# Patient Record
Sex: Male | Born: 1935 | Race: White | Hispanic: No | Smoking: Former smoker
Health system: Southern US, Community
[De-identification: ages and names within clinical notes are randomized; demographics above are authoritative.]

## PROBLEM LIST (undated history)

## (undated) DIAGNOSIS — K219 Gastro-esophageal reflux disease without esophagitis: Secondary | ICD-10-CM

## (undated) DIAGNOSIS — R609 Edema, unspecified: Secondary | ICD-10-CM

## (undated) DIAGNOSIS — F419 Anxiety disorder, unspecified: Secondary | ICD-10-CM

## (undated) DIAGNOSIS — G609 Hereditary and idiopathic neuropathy, unspecified: Secondary | ICD-10-CM

## (undated) DIAGNOSIS — Z8711 Personal history of peptic ulcer disease: Secondary | ICD-10-CM

## (undated) DIAGNOSIS — M1990 Unspecified osteoarthritis, unspecified site: Secondary | ICD-10-CM

## (undated) DIAGNOSIS — Z8739 Personal history of other diseases of the musculoskeletal system and connective tissue: Secondary | ICD-10-CM

## (undated) DIAGNOSIS — I739 Peripheral vascular disease, unspecified: Secondary | ICD-10-CM

## (undated) DIAGNOSIS — E7800 Pure hypercholesterolemia, unspecified: Secondary | ICD-10-CM

## (undated) DIAGNOSIS — F32A Depression, unspecified: Secondary | ICD-10-CM

## (undated) DIAGNOSIS — K449 Diaphragmatic hernia without obstruction or gangrene: Secondary | ICD-10-CM

## (undated) DIAGNOSIS — E119 Type 2 diabetes mellitus without complications: Secondary | ICD-10-CM

## (undated) DIAGNOSIS — K259 Gastric ulcer, unspecified as acute or chronic, without hemorrhage or perforation: Secondary | ICD-10-CM

## (undated) DIAGNOSIS — Z8719 Personal history of other diseases of the digestive system: Secondary | ICD-10-CM

## (undated) DIAGNOSIS — I10 Essential (primary) hypertension: Secondary | ICD-10-CM

## (undated) DIAGNOSIS — M75102 Unspecified rotator cuff tear or rupture of left shoulder, not specified as traumatic: Secondary | ICD-10-CM

## (undated) DIAGNOSIS — R42 Dizziness and giddiness: Secondary | ICD-10-CM

## (undated) DIAGNOSIS — Z87442 Personal history of urinary calculi: Secondary | ICD-10-CM

## (undated) DIAGNOSIS — I8390 Asymptomatic varicose veins of unspecified lower extremity: Secondary | ICD-10-CM

## (undated) DIAGNOSIS — E559 Vitamin D deficiency, unspecified: Secondary | ICD-10-CM

## (undated) DIAGNOSIS — F329 Major depressive disorder, single episode, unspecified: Secondary | ICD-10-CM

## (undated) HISTORY — DX: Vitamin D deficiency, unspecified: E55.9

## (undated) HISTORY — PX: CATARACT EXTRACTION W/ INTRAOCULAR LENS  IMPLANT, BILATERAL: SHX1307

## (undated) HISTORY — DX: Diaphragmatic hernia without obstruction or gangrene: K44.9

## (undated) HISTORY — PX: ESOPHAGEAL DILATION: SHX303

## (undated) HISTORY — PX: TONSILLECTOMY: SUR1361

## (undated) HISTORY — DX: Dizziness and giddiness: R42

## (undated) HISTORY — DX: Edema, unspecified: R60.9

## (undated) HISTORY — DX: Hereditary and idiopathic neuropathy, unspecified: G60.9

## (undated) HISTORY — PX: FRACTURE SURGERY: SHX138

## (undated) HISTORY — PX: MASTECTOMY: SHX3

---

## 2011-07-13 ENCOUNTER — Emergency Department (HOSPITAL_COMMUNITY)
Admission: EM | Admit: 2011-07-13 | Discharge: 2011-07-13 | Disposition: A | Discharge status: Home / Self Care | Payer: Medicare Other | Attending: Emergency Medicine | Admitting: Emergency Medicine

## 2011-07-13 ENCOUNTER — Encounter: Payer: Self-pay | Admitting: *Deleted

## 2011-07-13 ENCOUNTER — Emergency Department (HOSPITAL_COMMUNITY): Payer: Medicare Other

## 2011-07-13 DIAGNOSIS — I10 Essential (primary) hypertension: Secondary | ICD-10-CM | POA: Insufficient documentation

## 2011-07-13 DIAGNOSIS — S43005A Unspecified dislocation of left shoulder joint, initial encounter: Secondary | ICD-10-CM

## 2011-07-13 DIAGNOSIS — R079 Chest pain, unspecified: Secondary | ICD-10-CM | POA: Insufficient documentation

## 2011-07-13 DIAGNOSIS — M5030 Other cervical disc degeneration, unspecified cervical region: Secondary | ICD-10-CM | POA: Insufficient documentation

## 2011-07-13 DIAGNOSIS — E7800 Pure hypercholesterolemia, unspecified: Secondary | ICD-10-CM | POA: Insufficient documentation

## 2011-07-13 DIAGNOSIS — I6200 Nontraumatic subdural hemorrhage, unspecified: Secondary | ICD-10-CM | POA: Insufficient documentation

## 2011-07-13 DIAGNOSIS — S43006A Unspecified dislocation of unspecified shoulder joint, initial encounter: Secondary | ICD-10-CM | POA: Insufficient documentation

## 2011-07-13 DIAGNOSIS — Y9229 Other specified public building as the place of occurrence of the external cause: Secondary | ICD-10-CM | POA: Insufficient documentation

## 2011-07-13 DIAGNOSIS — S0180XA Unspecified open wound of other part of head, initial encounter: Secondary | ICD-10-CM | POA: Insufficient documentation

## 2011-07-13 DIAGNOSIS — W010XXA Fall on same level from slipping, tripping and stumbling without subsequent striking against object, initial encounter: Secondary | ICD-10-CM | POA: Insufficient documentation

## 2011-07-13 HISTORY — DX: Pure hypercholesterolemia, unspecified: E78.00

## 2011-07-13 HISTORY — DX: Essential (primary) hypertension: I10

## 2011-07-13 MED ORDER — ONDANSETRON HCL 4 MG/2ML IJ SOLN
4.0000 mg | Freq: Once | INTRAMUSCULAR | Status: AC
  Administered 2011-07-13: 4 mg via INTRAVENOUS

## 2011-07-13 MED ORDER — ONDANSETRON HCL 4 MG/2ML IJ SOLN
INTRAMUSCULAR | Status: AC
  Filled 2011-07-13: qty 2

## 2011-07-13 MED ORDER — ONDANSETRON HCL 4 MG/2ML IJ SOLN
INTRAMUSCULAR | Status: AC
  Administered 2011-07-13: 20:00:00
  Filled 2011-07-13: qty 2

## 2011-07-13 MED ORDER — ETOMIDATE 2 MG/ML IV SOLN
INTRAVENOUS | Status: AC
  Administered 2011-07-13: 10 mg
  Filled 2011-07-13: qty 10

## 2011-07-13 MED ORDER — HYDROMORPHONE HCL PF 1 MG/ML IJ SOLN
1.0000 mg | Freq: Once | INTRAMUSCULAR | Status: AC
  Administered 2011-07-13: 19:00:00 via INTRAVENOUS

## 2011-07-13 MED ORDER — TETANUS-DIPHTH-ACELL PERTUSSIS 5-2.5-18.5 LF-MCG/0.5 IM SUSP
0.5000 mL | Freq: Once | INTRAMUSCULAR | Status: AC
  Administered 2011-07-13: 0.5 mL via INTRAMUSCULAR
  Filled 2011-07-13: qty 0.5

## 2011-07-13 MED ORDER — HYDROCODONE-ACETAMINOPHEN 5-325 MG PO TABS: 1.0000 | ORAL_TABLET | Freq: Four times a day (QID) | ORAL | Status: AC | PRN

## 2011-07-13 MED ORDER — HYDROMORPHONE HCL PF 1 MG/ML IJ SOLN
INTRAMUSCULAR | Status: AC
  Filled 2011-07-13: qty 1

## 2011-07-13 MED ORDER — BACITRACIN ZINC 500 UNIT/GM EX OINT
TOPICAL_OINTMENT | CUTANEOUS | Status: AC
  Administered 2011-07-13
  Filled 2011-07-13: qty 0.9

## 2011-07-13 MED ORDER — NALOXONE HCL 1 MG/ML IJ SOLN
INTRAMUSCULAR | Status: AC
  Administered 2011-07-13: 19:00:00
  Filled 2011-07-13: qty 4

## 2011-07-13 MED ORDER — ONDANSETRON HCL 4 MG PO TABS: 8.0000 mg | ORAL_TABLET | Freq: Two times a day (BID) | ORAL | Status: AC | PRN

## 2011-07-13 MED ORDER — "THROMBI-PAD 3""X3"" EX PADS"
MEDICATED_PAD | CUTANEOUS | Status: AC
  Administered 2011-07-13: 20:00:00
  Filled 2011-07-13: qty 1

## 2011-07-13 MED ORDER — FENTANYL CITRATE 0.05 MG/ML IJ SOLN
50.0000 ug | Freq: Once | INTRAMUSCULAR | Status: AC
  Administered 2011-07-13: 50 ug via INTRAVENOUS
  Filled 2011-07-13: qty 2

## 2011-07-13 MED ORDER — ONDANSETRON HCL 4 MG/2ML IJ SOLN
4.0000 mg | Freq: Once | INTRAMUSCULAR | Status: AC
  Administered 2011-07-13: 4 mg via INTRAVENOUS
  Filled 2011-07-13: qty 2

## 2011-07-13 NOTE — ED Notes (Signed)
Rx given x2 D/c instructions reviewed w/ pt and family - pt and family deny any further questions or concerns at present.  

## 2011-07-13 NOTE — ED Provider Notes (Signed)
Logan Bean   9:25PM patient discussed with attending physician. Will repair chin laceration.  LACERATION REPAIR Performed by: Angus Seller Authorized by: Angus Seller Consent: Verbal consent obtained. Risks and benefits: risks, benefits and alternatives were discussed Consent given by: patient Patient identity confirmed: provided demographic data Prepped and Draped in normal sterile fashion Wound explored  Laceration Location: Chin  Laceration Length: 2.5 cm  No Foreign Bodies seen or palpated  Anesthesia: local infiltration  Local anesthetic: lidocaine 2% with epinephrine  Anesthetic total: 3 ml  Irrigation method: syringe Amount of cleaning: standard  Skin closure: Skin with 6-0 nylon   Number of sutures: 3   Technique: Simple interrupted   Patient tolerance: Patient tolerated the procedure well with no immediate complications.   Angus Seller, [place] 07/13/11 [phone]

## 2011-07-13 NOTE — ED Provider Notes (Signed)
Medical screening examination/treatment/procedure(s) were conducted as a shared visit with non-physician practitioner(s) and myself.  I personally evaluated the patient during the encounter  Doug Sou, MD 07/13/11 2300

## 2011-07-13 NOTE — ED Notes (Signed)
Patient transported to CT w/ Shawn Stall, RN

## 2011-07-13 NOTE — ED Notes (Signed)
Pt returned from CT- pt alert and active with care- family present and updated on pt current status- pt belonging given to male family member

## 2011-07-13 NOTE — ED Provider Notes (Addendum)
History     CSN: [id number]  Arrival date & time 07/13/11  1757   First MD Initiated Contact with Patient 07/13/11 1836      Chief Complaint  Patient presents with  . Fall    per EMS pt fell from standing position in grocery store. pt landed face first. denies LOC. pt has lac to chin and c/o left shoulder pain. EMS reports obvious shoulder deformity.     (Consider location/radiation/quality/duration/timing/severity/associated sxs/prior treatment) HPI Patient tripped and fell on medially prior to arrival injuring left shoulder . Also suffered laceration to chin is resolving event he denies loss of consciousness. Shoulder pain is worse with attempting to move his shoulder no other injury. Family members report patient has unsteady gait at baseline due to arthiris. EMS treated patient with long board hard collar and CID. They administered intravenous fentanyl without relief of pain Past Medical History  Diagnosis Date  . Hypertension   . Hypercholesteremia    Arthritis No past surgical history on file. Surgery left breast unknown reason No family history on file.  History  Substance Use Topics  . Smoking status: Not on file  . Smokeless tobacco: Not on file  . Alcohol Use:       Review of Systems  Constitutional: Negative.   HENT: Negative.   Respiratory: Negative.   Cardiovascular: Negative.   Gastrointestinal: Negative.   Musculoskeletal: Positive for arthralgias.       Left shoulder pain, chronic bilateral knee pain secondary to arthritis  Skin: Positive for wound.       Can laceration  Neurological: Negative.   Hematological: Negative.   Psychiatric/Behavioral: Negative.     Allergies  Morphine and related  Home Medications  No current outpatient prescriptions on file.  BP 153/64  Pulse 107  Temp(Src) 97.6 F (36.4 C) (Oral)  Resp 23  SpO2 97%  Physical Exam  Nursing note and vitals reviewed. Constitutional: He appears well-developed and  well-nourished. He appears distressed.       Osco coma score 15 appears uncomfortable  HENT:  Head: Normocephalic and atraumatic.       2.5 cm laceration to chin, oozing blood. No deformity teeth are intact bilateral tympanic membranes normal  Eyes: Conjunctivae are normal. Pupils are equal, round, and reactive to light.  Neck: No tracheal deviation present. No thyromegaly present.       No cervical spine  Cardiovascular: Normal rate and regular rhythm.   No murmur heard. Pulmonary/Chest: Effort normal and breath sounds normal.  Abdominal: Soft. He exhibits no distension. There is no tenderness.  Musculoskeletal: Normal range of motion. He exhibits no edema and no tenderness.       Left upper extremity with deformity his shoulder shoulder is held in an Abducted, externally rotated position, radial pulse 2+  Neurological: He is alert. Coordination normal.  Skin: Skin is warm and dry. No rash noted.  Psychiatric: He has a normal mood and affect.    ED Course  Procedural sedation Date/Time: 07/13/2011 6:56 PM Performed by: Doug Sou Authorized by: Doug Sou Consent: Verbal consent obtained. Risks and benefits: risks, benefits and alternatives were discussed Consent given by: power of attorney Patient understanding: patient states understanding of the procedure being performed Patient consent: the patient's understanding of the procedure matches consent given Procedure consent: procedure consent matches procedure scheduled Relevant documents: relevant documents present and verified Test results: test results available and properly labeled Site marked: the operative site was marked Imaging studies: imaging studies available Required  items: required blood products, implants, devices, and special equipment available Patient identity confirmed: verbally with patient, arm band and hospital-assigned identification number Time out: Immediately prior to procedure a "time out" was  called to verify the correct patient, procedure, equipment, support staff and site/side marked as required. Local anesthesia used: no Patient sedated: yes Sedation type: moderate (conscious) sedation Sedatives: etomidate Analgesia: hydromorphone Sedation start date/time: 07/13/2011 6:56 PM Sedation end date/time: 07/13/2011 7:20 PM Vitals: Vital signs were monitored during sedation. Comments: Patient desaturated immediately after procedure completed on pulse. His respirations were assisted with bag valve mask and supplemental oxygen he regained consciousness fully and was alert appropriate Glasgow Coma Score 15 no complication  Reduction of dislocation Date/Time: 07/13/2011 6:56 PM Performed by: Doug Sou Authorized by: Doug Sou Consent: Written consent obtained. Risks and benefits: risks, benefits and alternatives were discussed Consent given by: power of attorney Patient understanding: patient states understanding of the procedure being performed Patient consent: the patient's understanding of the procedure matches consent given Procedure consent: procedure consent matches procedure scheduled Relevant documents: relevant documents present and verified Test results: test results available and properly labeled Site marked: the operative site was marked Imaging studies: imaging studies available Required items: required blood products, implants, devices, and special equipment available Patient identity confirmed: verbally with patient and arm band Time out: Immediately prior to procedure a "time out" was called to verify the correct patient, procedure, equipment, support staff and site/side marked as required. Local anesthesia used: no Patient sedated: yes Sedation type: moderate (conscious) sedation Sedatives: etomidate Analgesia: hydromorphone Sedation start date/time: 07/13/2011 6:56 PM Sedation end date/time: 07/13/2011 7:20 PM Comments: Patient desaturated on pulse  oximetry immediately after reduction. His respirations were transiently assisted with bag valve mask. He subsequently became alert appropriate back to baseline mental status   (including critical care time) 10:10 PM patient alert ambulates without difficulty not lightheaded on standing complains of slight pain on standing at rib cage. RIBS minimally tender diffusely abdomen is reexamined nontender. Patient denies abdominal pain Laceration at the chin was repaired by Mr.Dammen. 10:50 PM patient alert awake feels ready to home  Labs Reviewed - No data to display Dg Shoulder Left  07/13/2011  *RADIOLOGY REPORT*  Clinical Data: Shoulder pain status post fall.  LEFT SHOULDER - 2+ VIEW  Comparison: None.  Findings: There is anterior dislocation of the glenohumeral joint. No definite fracture is identified.  The acromioclavicular joint appears mildly widened.  IMPRESSION: Anterior dislocation of the left glenohumeral joint.  Postreduction views are recommended to evaluate for associated fracture.  Original Report Authenticated By: Gerrianne Scale, M.D.     No diagnosis found.    MDM  Plan orthopedic referral Dr.Aluisio, patient request Prescription hydrocodone-apap,, zofran Sutures out 5 days Diagnosis number#1 fall #2 dislocation of left shoulder #3 laceration of chin #4 multiple conntusions        Doug Sou, MD 07/13/11 2255  Doug Sou, MD 07/13/11 2259

## 2011-07-13 NOTE — ED Notes (Signed)
Pts sats decreased, blow-by assist given, 1mg  NArcan given and sats increased to 96%. EDP Jacubowitz at bedside with administration.

## 2011-07-13 NOTE — ED Notes (Signed)
ZOX:WR60<AV> Expected date:07/13/11<BR> Expected time: 5:42 PM<BR> Means of arrival:Ambulance<BR> Comments:<BR> M41 - 89yoM Fall, chin lac and shoulder pain

## 2011-07-19 DIAGNOSIS — S43006A Unspecified dislocation of unspecified shoulder joint, initial encounter: Secondary | ICD-10-CM | POA: Diagnosis not present

## 2011-07-30 ENCOUNTER — Other Ambulatory Visit: Payer: Self-pay | Admitting: Internal Medicine

## 2011-07-30 ENCOUNTER — Ambulatory Visit
Admission: RE | Admit: 2011-07-30 | Discharge: 2011-07-30 | Disposition: A | Discharge status: Home / Self Care | Payer: Medicare Other | Source: Ambulatory Visit | Attending: Internal Medicine | Admitting: Internal Medicine

## 2011-07-30 DIAGNOSIS — M19079 Primary osteoarthritis, unspecified ankle and foot: Secondary | ICD-10-CM | POA: Diagnosis not present

## 2011-07-30 DIAGNOSIS — M869 Osteomyelitis, unspecified: Secondary | ICD-10-CM

## 2011-07-30 DIAGNOSIS — IMO0002 Reserved for concepts with insufficient information to code with codable children: Secondary | ICD-10-CM | POA: Diagnosis not present

## 2011-07-30 DIAGNOSIS — I10 Essential (primary) hypertension: Secondary | ICD-10-CM | POA: Diagnosis not present

## 2011-07-30 DIAGNOSIS — S43006A Unspecified dislocation of unspecified shoulder joint, initial encounter: Secondary | ICD-10-CM | POA: Diagnosis not present

## 2011-07-30 DIAGNOSIS — M2550 Pain in unspecified joint: Secondary | ICD-10-CM | POA: Diagnosis not present

## 2011-07-30 DIAGNOSIS — M949 Disorder of cartilage, unspecified: Secondary | ICD-10-CM | POA: Diagnosis not present

## 2011-07-30 DIAGNOSIS — E559 Vitamin D deficiency, unspecified: Secondary | ICD-10-CM | POA: Diagnosis not present

## 2011-07-30 DIAGNOSIS — R42 Dizziness and giddiness: Secondary | ICD-10-CM | POA: Diagnosis not present

## 2011-07-30 DIAGNOSIS — M899 Disorder of bone, unspecified: Secondary | ICD-10-CM | POA: Diagnosis not present

## 2011-07-30 DIAGNOSIS — E1365 Other specified diabetes mellitus with hyperglycemia: Secondary | ICD-10-CM | POA: Diagnosis not present

## 2011-07-30 DIAGNOSIS — E785 Hyperlipidemia, unspecified: Secondary | ICD-10-CM | POA: Diagnosis not present

## 2011-07-31 ENCOUNTER — Other Ambulatory Visit: Payer: Self-pay | Admitting: Internal Medicine

## 2011-07-31 DIAGNOSIS — M8580 Other specified disorders of bone density and structure, unspecified site: Secondary | ICD-10-CM

## 2011-08-05 ENCOUNTER — Encounter (HOSPITAL_BASED_OUTPATIENT_CLINIC_OR_DEPARTMENT_OTHER): Payer: Medicare Other | Attending: General Surgery

## 2011-08-05 DIAGNOSIS — S91109A Unspecified open wound of unspecified toe(s) without damage to nail, initial encounter: Secondary | ICD-10-CM | POA: Diagnosis not present

## 2011-08-05 DIAGNOSIS — E7800 Pure hypercholesterolemia, unspecified: Secondary | ICD-10-CM | POA: Diagnosis not present

## 2011-08-05 DIAGNOSIS — S43006A Unspecified dislocation of unspecified shoulder joint, initial encounter: Secondary | ICD-10-CM | POA: Diagnosis not present

## 2011-08-05 DIAGNOSIS — IMO0002 Reserved for concepts with insufficient information to code with codable children: Secondary | ICD-10-CM | POA: Insufficient documentation

## 2011-08-05 DIAGNOSIS — Z7982 Long term (current) use of aspirin: Secondary | ICD-10-CM | POA: Diagnosis not present

## 2011-08-05 DIAGNOSIS — Z79899 Other long term (current) drug therapy: Secondary | ICD-10-CM | POA: Insufficient documentation

## 2011-08-05 DIAGNOSIS — I10 Essential (primary) hypertension: Secondary | ICD-10-CM | POA: Diagnosis not present

## 2011-08-05 DIAGNOSIS — W19XXXA Unspecified fall, initial encounter: Secondary | ICD-10-CM | POA: Insufficient documentation

## 2011-08-05 NOTE — Progress Notes (Signed)
[facility]  NAME:  Logan Bean, Logan Bean              ACCOUNT NO.:  [id number]  MEDICAL RECORD NO.:  [id number]      DATE OF BIRTH:  06-09-1921  PHYSICIAN:  Joanne Gavel, M.D.         VISIT DATE:  08/05/2011                                  OFFICE VISIT   CHIEF COMPLAINT:  Wound, left great toe.  HISTORY OF PRESENT ILLNESS:  This patient fell approximately 3 weeks ago.  He developed a great deal of swelling in his foot along with a dislocation of the shoulder.  A blister formed and opened leaving a wound on the dorsum of the great toe.  He has no history of diabetes, no heart disease, lung disease, or kidney disease.  PAST SURGICAL HISTORY:  Negative.  Cigarettes, none.  Alcohol, none.  MEDICATIONS:  Lisinopril, aspirin, simvastatin.  ALLERGIES:  MORPHINE causes nausea, vomiting.  REVIEW OF SYSTEMS:  Noncontributory.  PHYSICAL EXAMINATION:  GENERAL APPEARANCE :  Awake, alert, well oriented, well developed, well nourished, in no distress. VITAL SIGNS:  Temperature 97.6, pulse 70, respirations 18, blood pressure 128/70. CRANIUM:  Normocephalic. EYES, EARS, NOSE, THROAT:  Normal. NECK:  Supple. CHEST:  Clear. HEART:  Regular rhythm. EXTREMITIES:  Normal symmetrical strength.  Examination of the foot reveals palpable pulse with an ABI of 0.9.  On the dorsum of the toe, there is an area that has been blistered, but now appears to be completely healed.  There is a small area that looks like it is somewhat abraded, but I do not believe there are any full-thickness injuries.  PLAN OF TREATMENT:  Continue triple antibiotic ointment and dressing. Let me see him in 7 days to be sure the wound is really healed.     Joanne Gavel, M.D.     RA/MEDQ  D:  08/05/2011  T:  08/05/2011  Job:  [id number]

## 2011-08-08 ENCOUNTER — Other Ambulatory Visit: Payer: Medicare Other

## 2011-08-12 DIAGNOSIS — S91109A Unspecified open wound of unspecified toe(s) without damage to nail, initial encounter: Secondary | ICD-10-CM | POA: Diagnosis not present

## 2011-08-12 DIAGNOSIS — IMO0002 Reserved for concepts with insufficient information to code with codable children: Secondary | ICD-10-CM | POA: Diagnosis not present

## 2011-08-12 DIAGNOSIS — Z7982 Long term (current) use of aspirin: Secondary | ICD-10-CM | POA: Diagnosis not present

## 2011-08-12 DIAGNOSIS — Z79899 Other long term (current) drug therapy: Secondary | ICD-10-CM | POA: Diagnosis not present

## 2011-08-13 ENCOUNTER — Other Ambulatory Visit: Payer: Medicare Other

## 2011-08-14 DIAGNOSIS — S43006A Unspecified dislocation of unspecified shoulder joint, initial encounter: Secondary | ICD-10-CM | POA: Diagnosis not present

## 2011-09-01 DIAGNOSIS — S43006A Unspecified dislocation of unspecified shoulder joint, initial encounter: Secondary | ICD-10-CM | POA: Diagnosis not present

## 2011-09-02 DIAGNOSIS — I10 Essential (primary) hypertension: Secondary | ICD-10-CM | POA: Diagnosis not present

## 2011-09-02 DIAGNOSIS — E559 Vitamin D deficiency, unspecified: Secondary | ICD-10-CM | POA: Diagnosis not present

## 2011-09-02 DIAGNOSIS — E785 Hyperlipidemia, unspecified: Secondary | ICD-10-CM | POA: Diagnosis not present

## 2011-09-02 DIAGNOSIS — S43006A Unspecified dislocation of unspecified shoulder joint, initial encounter: Secondary | ICD-10-CM | POA: Diagnosis not present

## 2011-09-05 DIAGNOSIS — S43006A Unspecified dislocation of unspecified shoulder joint, initial encounter: Secondary | ICD-10-CM | POA: Diagnosis not present

## 2011-09-09 ENCOUNTER — Encounter (HOSPITAL_BASED_OUTPATIENT_CLINIC_OR_DEPARTMENT_OTHER): Payer: Medicare Other

## 2011-09-09 DIAGNOSIS — M75120 Complete rotator cuff tear or rupture of unspecified shoulder, not specified as traumatic: Secondary | ICD-10-CM | POA: Diagnosis not present

## 2011-09-15 DIAGNOSIS — M75120 Complete rotator cuff tear or rupture of unspecified shoulder, not specified as traumatic: Secondary | ICD-10-CM | POA: Diagnosis not present

## 2011-09-17 DIAGNOSIS — E785 Hyperlipidemia, unspecified: Secondary | ICD-10-CM | POA: Diagnosis not present

## 2011-09-17 DIAGNOSIS — Z01810 Encounter for preprocedural cardiovascular examination: Secondary | ICD-10-CM | POA: Diagnosis not present

## 2011-09-17 DIAGNOSIS — E1365 Other specified diabetes mellitus with hyperglycemia: Secondary | ICD-10-CM | POA: Diagnosis not present

## 2011-09-17 DIAGNOSIS — S43006A Unspecified dislocation of unspecified shoulder joint, initial encounter: Secondary | ICD-10-CM | POA: Diagnosis not present

## 2011-09-30 DIAGNOSIS — E138 Other specified diabetes mellitus with unspecified complications: Secondary | ICD-10-CM | POA: Diagnosis not present

## 2011-09-30 DIAGNOSIS — IMO0002 Reserved for concepts with insufficient information to code with codable children: Secondary | ICD-10-CM | POA: Diagnosis not present

## 2011-09-30 DIAGNOSIS — I10 Essential (primary) hypertension: Secondary | ICD-10-CM | POA: Diagnosis not present

## 2011-09-30 DIAGNOSIS — E559 Vitamin D deficiency, unspecified: Secondary | ICD-10-CM | POA: Diagnosis not present

## 2011-09-30 DIAGNOSIS — E785 Hyperlipidemia, unspecified: Secondary | ICD-10-CM | POA: Diagnosis not present

## 2011-10-22 ENCOUNTER — Encounter (HOSPITAL_COMMUNITY): Payer: Self-pay | Admitting: Pharmacy Technician

## 2011-10-27 ENCOUNTER — Encounter (HOSPITAL_COMMUNITY): Payer: Self-pay

## 2011-10-27 ENCOUNTER — Encounter (HOSPITAL_COMMUNITY): Payer: Self-pay | Admitting: *Deleted

## 2011-10-27 ENCOUNTER — Encounter (HOSPITAL_COMMUNITY)
Admission: RE | Admit: 2011-10-27 | Discharge: 2011-10-27 | Disposition: A | Discharge status: Home / Self Care | Payer: Medicare Other | Source: Ambulatory Visit | Attending: Orthopedic Surgery | Admitting: Orthopedic Surgery

## 2011-10-27 DIAGNOSIS — Z01812 Encounter for preprocedural laboratory examination: Secondary | ICD-10-CM | POA: Diagnosis not present

## 2011-10-27 DIAGNOSIS — M069 Rheumatoid arthritis, unspecified: Secondary | ICD-10-CM | POA: Diagnosis not present

## 2011-10-27 DIAGNOSIS — M25819 Other specified joint disorders, unspecified shoulder: Secondary | ICD-10-CM | POA: Diagnosis not present

## 2011-10-27 DIAGNOSIS — M1990 Unspecified osteoarthritis, unspecified site: Secondary | ICD-10-CM | POA: Diagnosis not present

## 2011-10-27 DIAGNOSIS — I10 Essential (primary) hypertension: Secondary | ICD-10-CM | POA: Diagnosis not present

## 2011-10-27 DIAGNOSIS — E7800 Pure hypercholesterolemia, unspecified: Secondary | ICD-10-CM | POA: Diagnosis not present

## 2011-10-27 DIAGNOSIS — Z79899 Other long term (current) drug therapy: Secondary | ICD-10-CM | POA: Diagnosis not present

## 2011-10-27 DIAGNOSIS — Z7982 Long term (current) use of aspirin: Secondary | ICD-10-CM | POA: Diagnosis not present

## 2011-10-27 DIAGNOSIS — S43429A Sprain of unspecified rotator cuff capsule, initial encounter: Secondary | ICD-10-CM | POA: Diagnosis not present

## 2011-10-27 LAB — DIFFERENTIAL
Basophils Absolute: 0 10*3/uL (ref 0.0–0.1)
Basophils Relative: 1 % (ref 0–1)
Lymphocytes Relative: 25 % (ref 12–46)
Monocytes Relative: 8 % (ref 3–12)
Neutro Abs: 4 10*3/uL (ref 1.7–7.7)
Neutrophils Relative %: 64 % (ref 43–77)

## 2011-10-27 LAB — SURGICAL PCR SCREEN
MRSA, PCR: NEGATIVE
Staphylococcus aureus: POSITIVE — AB

## 2011-10-27 LAB — CBC
Hemoglobin: 14.8 g/dL (ref 13.0–17.0)
MCHC: 32.2 g/dL (ref 30.0–36.0)
WBC: 6.2 10*3/uL (ref 4.0–10.5)

## 2011-10-27 LAB — BASIC METABOLIC PANEL
CO2: 27 mEq/L (ref 19–32)
Calcium: 10 mg/dL (ref 8.4–10.5)
Chloride: 104 mEq/L (ref 96–112)
Glucose, Bld: 85 mg/dL (ref 70–99)
Potassium: 4.3 mEq/L (ref 3.5–5.1)
Sodium: 140 mEq/L (ref 135–145)

## 2011-10-27 NOTE — Pre-Procedure Instructions (Addendum)
Medical clearance note dr Glade Lloyd on chart ekg 09-17-2011  [facility] senior care on chart Chest 1 view 07-03-2011 in epic

## 2011-10-27 NOTE — Patient Instructions (Signed)
20 Logan Bean  10/27/2011   Your procedure is scheduled on:  Wednesday 09-07-2011  Report to [facility] at  0830 AM.  Call this number if you have problems the morning of surgery: [phone]   Remember:   Do not eat food or drink liquids:After Midnight.  .  Take these medicines the morning of surgery with A SIP OF WATER: omeprazole   Do not wear jewelry or make up.  Do not wear lotions, powders, or perfumes.Do not wear deodorant.    Do not bring valuables to the hospital.  Contacts, dentures or bridgework may not be worn into surgery.  Leave suitcase in the car. After surgery it may be brought to your room.  For patients admitted to the hospital, checkout time is 11:00 AM the day of discharge.     Special Instructions: CHG Shower Use Special Wash: 1/2 bottle night before surgery and 1/2 bottle morning of surgery.neck down avoid private area.   Please read over the following fact sheets that you were given: MRSA Information, incentive spirometer fact sheet.  Cain Sieve [facility] pre op nurse phone number [phone], call if needed

## 2011-10-31 DIAGNOSIS — M109 Gout, unspecified: Secondary | ICD-10-CM | POA: Diagnosis not present

## 2011-10-31 DIAGNOSIS — E559 Vitamin D deficiency, unspecified: Secondary | ICD-10-CM | POA: Diagnosis not present

## 2011-10-31 DIAGNOSIS — E1365 Other specified diabetes mellitus with hyperglycemia: Secondary | ICD-10-CM | POA: Diagnosis not present

## 2011-10-31 DIAGNOSIS — M75120 Complete rotator cuff tear or rupture of unspecified shoulder, not specified as traumatic: Secondary | ICD-10-CM | POA: Diagnosis not present

## 2011-11-04 NOTE — H&P (Signed)
  Bayan Menna DOB: [birth year]  Chief Complaint: left shoulder pain  History of Present Illness The patient is an 78 year old male who is scheduled for an open left rotator cuff repair with possible use of graft and anchors with Dr. Darrelyn Hillock on Wednesday November 05, 2011. The patient first presented to Dr. Darrelyn Hillock with a shoulder dislocation in February of 2013 after he feel in [employer]. He went through physical therapy and continued to have pain and weakness in the left shoulder. MRI was ordered which showed that he has massive tears of the left rotator cuff. Most predictable means for decreased pain and increased function in the left shoulder is an open rotator cuff repair of the left rotator cuff. Risks and benefits of the surgery discussed. He was given surgical clearance by Dr. Sander Radon.   Past Medical History Hypertension Hypercholesterolemia Osteoarthritis Rheumatoid Arthritis  Allergies Morphine Derivatives   Family History Heart Disease. father Heart disease in male family member before age 11 Hypertension. father   Social History Alcohol use. current drinker; drinks beer and wine; less than 5 per week Children. 0 Current work status. retired Financial planner (Currently). no Drug/Alcohol Rehab (Previously). no Exercise. Exercises never; does other Illicit drug use. no Living situation. live with caregiver Marital status. widowed Pain Contract. no   Medication History Simvastatin ( Oral) Specific dose unknown - Active. Lisinopril ( Oral) Specific dose unknown - Active. AmLODIPine Besylate ( Oral) Specific dose unknown - Active. Aspirin Low Strength ( Oral) Specific dose unknown - Active. Glucosamine Chondroitin Complx ( Oral) Specific dose unknown - Active. Fish Oil Burp-Less ( Oral) Specific dose unknown - Active. Multiple Vitamin ( Oral) Specific dose unknown - Active.   Past Surgical History Tonsillectomy   Review of  Systems Skin:Present- Skin Color Changes. Not Present- Itching, Rash, Ulcer, Psoriasis and Change in Hair or Nails. HEENT:Not Present- Sensitivity to light, Hearing problems, Nose Bleed and Ringing in the Ears. Respiratory:Present- Chronic Cough. Not Present- Snoring, Bloody sputum and Dyspnea. Cardiovascular:Present- Swelling of Extremities and Leg Cramps. Not Present- Shortness of Breath, Chest Pain and Palpitations. Male Genitourinary:Present- Incontinence. Not Present- Blood in Urine, Frequency and Nocturia. Musculoskeletal:Present- Joint Stiffness, Joint Swelling, Joint Pain and Back Pain. Not Present- Muscle Weakness and Muscle Pain. Neurological:Present- Tremor. Not Present- Tingling, Numbness, Burning, Headaches and Dizziness. Hematology:Present- Easy Bruising. Not Present- Abnormal Bleeding, Anemia and Blood Clots.   Vitals Weight: 161 lb Height: 63.5 in Body Surface Area: 1.81 m Body Mass Index: 28.07 kg/m BP: 114/69 (Sitting, Left Arm, Standard)   Physical Exam Alert and oriented. No acute distress. The lungs are clear today. Heart normal sinus rhythm and no murmur. Oral cavity is negative. He is unable to abduct beyond about 20 degrees. Shoulder flexion and extension is painful. Abdomen soft and nontender. Bowel sounds active. Neck supple.    Assessment & Plan Complex rotator cuff tear, left shoulder Open repair left shoulder rotator cuff tear      Dimitri Ped, PA-C

## 2011-11-05 ENCOUNTER — Other Ambulatory Visit: Payer: Self-pay | Admitting: Surgical

## 2011-11-05 ENCOUNTER — Ambulatory Visit (HOSPITAL_COMMUNITY)
Admission: RE | Admit: 2011-11-05 | Discharge: 2011-11-06 | Disposition: A | Discharge status: Home / Self Care | Payer: Medicare Other | Source: Ambulatory Visit | Attending: Orthopedic Surgery | Admitting: Orthopedic Surgery

## 2011-11-05 ENCOUNTER — Encounter (HOSPITAL_COMMUNITY)
Admission: RE | Disposition: A | Discharge status: Home / Self Care | Payer: Self-pay | Source: Ambulatory Visit | Attending: Orthopedic Surgery

## 2011-11-05 ENCOUNTER — Ambulatory Visit (HOSPITAL_COMMUNITY): Payer: Medicare Other | Admitting: Anesthesiology

## 2011-11-05 ENCOUNTER — Encounter (HOSPITAL_COMMUNITY): Payer: Self-pay | Admitting: Anesthesiology

## 2011-11-05 ENCOUNTER — Encounter (HOSPITAL_COMMUNITY): Payer: Self-pay | Admitting: *Deleted

## 2011-11-05 DIAGNOSIS — M25819 Other specified joint disorders, unspecified shoulder: Secondary | ICD-10-CM | POA: Insufficient documentation

## 2011-11-05 DIAGNOSIS — S46819A Strain of other muscles, fascia and tendons at shoulder and upper arm level, unspecified arm, initial encounter: Secondary | ICD-10-CM | POA: Diagnosis not present

## 2011-11-05 DIAGNOSIS — I10 Essential (primary) hypertension: Secondary | ICD-10-CM | POA: Insufficient documentation

## 2011-11-05 DIAGNOSIS — M069 Rheumatoid arthritis, unspecified: Secondary | ICD-10-CM | POA: Insufficient documentation

## 2011-11-05 DIAGNOSIS — M75122 Complete rotator cuff tear or rupture of left shoulder, not specified as traumatic: Secondary | ICD-10-CM | POA: Diagnosis present

## 2011-11-05 DIAGNOSIS — Z01812 Encounter for preprocedural laboratory examination: Secondary | ICD-10-CM | POA: Insufficient documentation

## 2011-11-05 DIAGNOSIS — K219 Gastro-esophageal reflux disease without esophagitis: Secondary | ICD-10-CM | POA: Diagnosis not present

## 2011-11-05 DIAGNOSIS — E7800 Pure hypercholesterolemia, unspecified: Secondary | ICD-10-CM | POA: Diagnosis not present

## 2011-11-05 DIAGNOSIS — S43429A Sprain of unspecified rotator cuff capsule, initial encounter: Secondary | ICD-10-CM | POA: Insufficient documentation

## 2011-11-05 DIAGNOSIS — S43499A Other sprain of unspecified shoulder joint, initial encounter: Secondary | ICD-10-CM | POA: Diagnosis not present

## 2011-11-05 DIAGNOSIS — Z79899 Other long term (current) drug therapy: Secondary | ICD-10-CM | POA: Insufficient documentation

## 2011-11-05 DIAGNOSIS — M1990 Unspecified osteoarthritis, unspecified site: Secondary | ICD-10-CM | POA: Insufficient documentation

## 2011-11-05 DIAGNOSIS — Z7982 Long term (current) use of aspirin: Secondary | ICD-10-CM | POA: Insufficient documentation

## 2011-11-05 DIAGNOSIS — E119 Type 2 diabetes mellitus without complications: Secondary | ICD-10-CM | POA: Diagnosis not present

## 2011-11-05 DIAGNOSIS — X58XXXA Exposure to other specified factors, initial encounter: Secondary | ICD-10-CM | POA: Insufficient documentation

## 2011-11-05 HISTORY — DX: Asymptomatic varicose veins of unspecified lower extremity: I83.90

## 2011-11-05 HISTORY — DX: Gastric ulcer, unspecified as acute or chronic, without hemorrhage or perforation: K25.9

## 2011-11-05 HISTORY — DX: Gastro-esophageal reflux disease without esophagitis: K21.9

## 2011-11-05 HISTORY — DX: Personal history of other diseases of the digestive system: Z87.19

## 2011-11-05 HISTORY — DX: Unspecified rotator cuff tear or rupture of left shoulder, not specified as traumatic: M75.102

## 2011-11-05 HISTORY — PX: SHOULDER OPEN ROTATOR CUFF REPAIR: SHX2407

## 2011-11-05 HISTORY — DX: Unspecified osteoarthritis, unspecified site: M19.90

## 2011-11-05 SURGERY — REPAIR, ROTATOR CUFF, OPEN
Anesthesia: General | Site: Shoulder | Laterality: Left | Wound class: Clean

## 2011-11-05 MED ORDER — HYDROMORPHONE HCL PF 1 MG/ML IJ SOLN: 0.2500 mg | INTRAMUSCULAR | Status: DC | PRN

## 2011-11-05 MED ORDER — ROCURONIUM BROMIDE 100 MG/10ML IV SOLN
INTRAVENOUS | Status: DC | PRN
  Administered 2011-11-05: 30 mg via INTRAVENOUS

## 2011-11-05 MED ORDER — BISACODYL 10 MG RE SUPP: 10.0000 mg | Freq: Every day | RECTAL | Status: DC | PRN

## 2011-11-05 MED ORDER — BACITRACIN-NEOMYCIN-POLYMYXIN 400-5-5000 EX OINT
TOPICAL_OINTMENT | CUTANEOUS | Status: AC
  Filled 2011-11-05: qty 1

## 2011-11-05 MED ORDER — MENTHOL 3 MG MT LOZG: 1.0000 | LOZENGE | OROMUCOSAL | Status: DC | PRN

## 2011-11-05 MED ORDER — ONDANSETRON HCL 4 MG/2ML IJ SOLN
INTRAMUSCULAR | Status: DC | PRN
  Administered 2011-11-05: 4 mg via INTRAVENOUS

## 2011-11-05 MED ORDER — CEFAZOLIN SODIUM 1-5 GM-% IV SOLN
1.0000 g | INTRAVENOUS | Status: AC
  Administered 2011-11-05: 1 g via INTRAVENOUS

## 2011-11-05 MED ORDER — SIMVASTATIN 20 MG PO TABS
20.0000 mg | ORAL_TABLET | Freq: Every day | ORAL | Status: DC
  Administered 2011-11-05: 20 mg via ORAL
  Filled 2011-11-05 (×2): qty 1

## 2011-11-05 MED ORDER — SODIUM CHLORIDE 0.9 % IR SOLN
Status: DC | PRN
  Administered 2011-11-05: 12:00:00

## 2011-11-05 MED ORDER — ACETAMINOPHEN 10 MG/ML IV SOLN
INTRAVENOUS | Status: AC
  Filled 2011-11-05: qty 100

## 2011-11-05 MED ORDER — LACTATED RINGERS IV SOLN: INTRAVENOUS | Status: DC

## 2011-11-05 MED ORDER — ACETAMINOPHEN 10 MG/ML IV SOLN
INTRAVENOUS | Status: DC | PRN
  Administered 2011-11-05: 1000 mg via INTRAVENOUS

## 2011-11-05 MED ORDER — METOCLOPRAMIDE HCL 5 MG/ML IJ SOLN
5.0000 mg | Freq: Three times a day (TID) | INTRAMUSCULAR | Status: DC | PRN
  Administered 2011-11-05: 5 mg via INTRAVENOUS

## 2011-11-05 MED ORDER — BACITRACIN-NEOMYCIN-POLYMYXIN 400-5-5000 EX OINT: TOPICAL_OINTMENT | CUTANEOUS | Status: DC | PRN

## 2011-11-05 MED ORDER — ONDANSETRON HCL 4 MG/2ML IJ SOLN
4.0000 mg | Freq: Four times a day (QID) | INTRAMUSCULAR | Status: DC | PRN
  Administered 2011-11-05: 4 mg via INTRAVENOUS

## 2011-11-05 MED ORDER — HYDROCODONE-ACETAMINOPHEN 5-325 MG PO TABS
1.0000 | ORAL_TABLET | ORAL | Status: DC | PRN
  Administered 2011-11-05: 1 via ORAL
  Filled 2011-11-05: qty 1

## 2011-11-05 MED ORDER — GLYCOPYRROLATE 0.2 MG/ML IJ SOLN
INTRAMUSCULAR | Status: DC | PRN
  Administered 2011-11-05: 0.4 mg via INTRAVENOUS

## 2011-11-05 MED ORDER — BUPIVACAINE LIPOSOME 1.3 % IJ SUSP
20.0000 mL | Freq: Once | INTRAMUSCULAR | Status: AC
  Administered 2011-11-05: 18 mL
  Filled 2011-11-05: qty 20

## 2011-11-05 MED ORDER — PANTOPRAZOLE SODIUM 40 MG PO TBEC
40.0000 mg | DELAYED_RELEASE_TABLET | Freq: Every day | ORAL | Status: DC
  Administered 2011-11-06: 40 mg via ORAL
  Filled 2011-11-05: qty 1

## 2011-11-05 MED ORDER — PROPOFOL 10 MG/ML IV BOLUS
INTRAVENOUS | Status: DC | PRN
  Administered 2011-11-05: 100 mg via INTRAVENOUS

## 2011-11-05 MED ORDER — FLEET ENEMA 7-19 GM/118ML RE ENEM: 1.0000 | ENEMA | Freq: Once | RECTAL | Status: AC | PRN

## 2011-11-05 MED ORDER — NEOSTIGMINE METHYLSULFATE 1 MG/ML IJ SOLN
INTRAMUSCULAR | Status: DC | PRN
  Administered 2011-11-05: 3 mg via INTRAVENOUS

## 2011-11-05 MED ORDER — HYDROMORPHONE HCL PF 1 MG/ML IJ SOLN: 0.5000 mg | INTRAMUSCULAR | Status: DC | PRN

## 2011-11-05 MED ORDER — AMLODIPINE BESYLATE 5 MG PO TABS
5.0000 mg | ORAL_TABLET | Freq: Every day | ORAL | Status: DC
  Administered 2011-11-05: 5 mg via ORAL
  Filled 2011-11-05 (×2): qty 1

## 2011-11-05 MED ORDER — OXYCODONE-ACETAMINOPHEN 5-325 MG PO TABS
1.0000 | ORAL_TABLET | ORAL | Status: DC | PRN
  Administered 2011-11-05 – 2011-11-06 (×2): 1 via ORAL
  Filled 2011-11-05 (×2): qty 1

## 2011-11-05 MED ORDER — POLYETHYLENE GLYCOL 3350 17 G PO PACK
17.0000 g | PACK | Freq: Every day | ORAL | Status: DC | PRN
  Filled 2011-11-05: qty 1

## 2011-11-05 MED ORDER — ASPIRIN EC 81 MG PO TBEC
81.0000 mg | DELAYED_RELEASE_TABLET | Freq: Every day | ORAL | Status: DC
  Administered 2011-11-05 – 2011-11-06 (×2): 81 mg via ORAL
  Filled 2011-11-05 (×2): qty 1

## 2011-11-05 MED ORDER — METOCLOPRAMIDE HCL 5 MG PO TABS
5.0000 mg | ORAL_TABLET | Freq: Three times a day (TID) | ORAL | Status: DC | PRN
  Filled 2011-11-05: qty 2

## 2011-11-05 MED ORDER — ACETAMINOPHEN 650 MG RE SUPP: 650.0000 mg | Freq: Four times a day (QID) | RECTAL | Status: DC | PRN

## 2011-11-05 MED ORDER — ONDANSETRON HCL 4 MG PO TABS
4.0000 mg | ORAL_TABLET | Freq: Four times a day (QID) | ORAL | Status: DC | PRN
  Filled 2011-11-05: qty 1

## 2011-11-05 MED ORDER — LISINOPRIL 20 MG PO TABS
20.0000 mg | ORAL_TABLET | Freq: Every day | ORAL | Status: DC
  Administered 2011-11-05: 20 mg via ORAL
  Filled 2011-11-05 (×2): qty 1

## 2011-11-05 MED ORDER — PHENOL 1.4 % MT LIQD: 1.0000 | OROMUCOSAL | Status: DC | PRN

## 2011-11-05 MED ORDER — CEFAZOLIN SODIUM 1-5 GM-% IV SOLN
INTRAVENOUS | Status: AC
  Filled 2011-11-05: qty 50

## 2011-11-05 MED ORDER — THROMBIN 5000 UNITS EX SOLR
CUTANEOUS | Status: AC
  Filled 2011-11-05: qty 10000

## 2011-11-05 MED ORDER — BUPIVACAINE-EPINEPHRINE 0.5% -1:200000 IJ SOLN
INTRAMUSCULAR | Status: AC
  Filled 2011-11-05: qty 1

## 2011-11-05 MED ORDER — LACTATED RINGERS IV SOLN
INTRAVENOUS | Status: DC
  Administered 2011-11-05: 10:00:00 via INTRAVENOUS

## 2011-11-05 MED ORDER — BUPIVACAINE LIPOSOME 1.3 % IJ SUSP: 20.0000 mL | Freq: Once | INTRAMUSCULAR | Status: DC

## 2011-11-05 MED ORDER — METOCLOPRAMIDE HCL 5 MG/ML IJ SOLN
INTRAMUSCULAR | Status: AC
  Filled 2011-11-05: qty 2

## 2011-11-05 MED ORDER — METHOCARBAMOL 500 MG PO TABS
500.0000 mg | ORAL_TABLET | Freq: Four times a day (QID) | ORAL | Status: DC | PRN
  Administered 2011-11-05 – 2011-11-06 (×2): 500 mg via ORAL
  Filled 2011-11-05 (×2): qty 1

## 2011-11-05 MED ORDER — ONDANSETRON HCL 4 MG/2ML IJ SOLN
INTRAMUSCULAR | Status: AC
  Filled 2011-11-05: qty 2

## 2011-11-05 MED ORDER — LACTATED RINGERS IV SOLN
INTRAVENOUS | Status: DC
  Administered 2011-11-05 – 2011-11-06 (×2): via INTRAVENOUS

## 2011-11-05 MED ORDER — METHOCARBAMOL 100 MG/ML IJ SOLN
500.0000 mg | Freq: Four times a day (QID) | INTRAVENOUS | Status: DC | PRN
  Administered 2011-11-05: 500 mg via INTRAVENOUS
  Filled 2011-11-05: qty 5

## 2011-11-05 MED ORDER — ROPIVACAINE HCL 5 MG/ML IJ SOLN
INTRAMUSCULAR | Status: AC
  Filled 2011-11-05: qty 30

## 2011-11-05 MED ORDER — HYDROMORPHONE HCL PF 1 MG/ML IJ SOLN
INTRAMUSCULAR | Status: DC | PRN
  Administered 2011-11-05 (×2): 0.5 mg via INTRAVENOUS
  Administered 2011-11-05: 1 mg via INTRAVENOUS

## 2011-11-05 MED ORDER — LIDOCAINE HCL (CARDIAC) 20 MG/ML IV SOLN
INTRAVENOUS | Status: DC | PRN
  Administered 2011-11-05: 50 mg via INTRAVENOUS

## 2011-11-05 MED ORDER — FENTANYL CITRATE 0.05 MG/ML IJ SOLN
INTRAMUSCULAR | Status: DC | PRN
  Administered 2011-11-05 (×2): 100 ug via INTRAVENOUS
  Administered 2011-11-05: 50 ug via INTRAVENOUS

## 2011-11-05 MED ORDER — CEFAZOLIN SODIUM 1-5 GM-% IV SOLN
1.0000 g | Freq: Four times a day (QID) | INTRAVENOUS | Status: AC
  Administered 2011-11-05 – 2011-11-06 (×3): 1 g via INTRAVENOUS
  Filled 2011-11-05 (×3): qty 50

## 2011-11-05 MED ORDER — ACETAMINOPHEN 325 MG PO TABS: 650.0000 mg | ORAL_TABLET | Freq: Four times a day (QID) | ORAL | Status: DC | PRN

## 2011-11-05 SURGICAL SUPPLY — 48 items
ANCHOR PEEK ZIP 5.5 NDL NO2 (Orthopedic Implant) ×2 IMPLANT
BAG ZIPLOCK 12X15 (MISCELLANEOUS) ×2 IMPLANT
BLADE OSCILLATING/SAGITTAL (BLADE) ×1
BLADE SW THK.38XMED LNG THN (BLADE) ×1 IMPLANT
BNDG COHESIVE 6X5 TAN NS LF (GAUZE/BANDAGES/DRESSINGS) ×2 IMPLANT
BUR OVAL CARBIDE 4.0 (BURR) ×2 IMPLANT
CLEANER TIP ELECTROSURG 2X2 (MISCELLANEOUS) ×2 IMPLANT
CLOTH BEACON ORANGE TIMEOUT ST (SAFETY) ×2 IMPLANT
DRAPE POUCH INSTRU U-SHP 10X18 (DRAPES) ×2 IMPLANT
DRSG ADAPTIC 3X8 NADH LF (GAUZE/BANDAGES/DRESSINGS) ×2 IMPLANT
DRSG EMULSION OIL 3X3 NADH (GAUZE/BANDAGES/DRESSINGS) ×2 IMPLANT
DRSG PAD ABDOMINAL 8X10 ST (GAUZE/BANDAGES/DRESSINGS) ×6 IMPLANT
DURAPREP 26ML APPLICATOR (WOUND CARE) ×2 IMPLANT
ELECT REM PT RETURN 9FT ADLT (ELECTROSURGICAL) ×2
ELECTRODE REM PT RTRN 9FT ADLT (ELECTROSURGICAL) ×1 IMPLANT
FLOSEAL 10ML (HEMOSTASIS) IMPLANT
GLOVE BIO SURGEON STRL SZ 6.5 (GLOVE) ×2 IMPLANT
GLOVE BIOGEL PI IND STRL 8 (GLOVE) ×1 IMPLANT
GLOVE BIOGEL PI IND STRL 8.5 (GLOVE) ×1 IMPLANT
GLOVE BIOGEL PI INDICATOR 8 (GLOVE) ×1
GLOVE BIOGEL PI INDICATOR 8.5 (GLOVE) ×1
GLOVE ECLIPSE 8.0 STRL XLNG CF (GLOVE) ×4 IMPLANT
GOWN PREVENTION PLUS LG XLONG (DISPOSABLE) ×4 IMPLANT
GOWN STRL REIN XL XLG (GOWN DISPOSABLE) ×4 IMPLANT
KIT BASIN OR (CUSTOM PROCEDURE TRAY) ×2 IMPLANT
MANIFOLD NEPTUNE II (INSTRUMENTS) ×2 IMPLANT
NEEDLE MA TROC 1/2 (NEEDLE) IMPLANT
NS IRRIG 1000ML POUR BTL (IV SOLUTION) IMPLANT
PACK SHOULDER CUSTOM OPM052 (CUSTOM PROCEDURE TRAY) ×2 IMPLANT
PASSER SUT SWANSON 36MM LOOP (INSTRUMENTS) IMPLANT
PATCH TISSUE MEND 3X3CM (Orthopedic Implant) ×2 IMPLANT
POSITIONER SURGICAL ARM (MISCELLANEOUS) ×2 IMPLANT
SLING ARM IMMOBILIZER LRG (SOFTGOODS) ×2 IMPLANT
SPONGE GAUZE 4X4 12PLY (GAUZE/BANDAGES/DRESSINGS) ×2 IMPLANT
SPONGE SURGIFOAM ABS GEL 100 (HEMOSTASIS) IMPLANT
STAPLER VISISTAT 35W (STAPLE) IMPLANT
STRIP CLOSURE SKIN 1/2X4 (GAUZE/BANDAGES/DRESSINGS) ×2 IMPLANT
SUCTION FRAZIER 12FR DISP (SUCTIONS) ×2 IMPLANT
SUT BONE WAX W31G (SUTURE) ×2 IMPLANT
SUT ETHIBOND NAB CT1 #1 30IN (SUTURE) ×4 IMPLANT
SUT MNCRL AB 4-0 PS2 18 (SUTURE) ×2 IMPLANT
SUT VIC AB 0 CT1 27 (SUTURE)
SUT VIC AB 0 CT1 27XBRD ANTBC (SUTURE) IMPLANT
SUT VIC AB 1 CT1 27 (SUTURE) ×2
SUT VIC AB 1 CT1 27XBRD ANTBC (SUTURE) ×2 IMPLANT
SUT VIC AB 2-0 CT1 27 (SUTURE) ×1
SUT VIC AB 2-0 CT1 27XBRD (SUTURE) ×1 IMPLANT
TOWEL OR 17X26 10 PK STRL BLUE (TOWEL DISPOSABLE) ×4 IMPLANT

## 2011-11-05 NOTE — Brief Op Note (Signed)
11/05/2011  11:55 AM  PATIENT:  Logan Bean  78 y.o. male  PRE-OPERATIVE DIAGNOSIS:  Left Shoulder Rotator Cuff Tear  POST-OPERATIVE DIAGNOSIS:  Left Shoulder Rotator Cuff Tear  PROCEDURE:  Procedure(s) (LRB): ROTATOR CUFF REPAIR SHOULDER OPEN (Left)  SURGEON:  Surgeon(s) and Role:    * Jacki Cones, MD - Primary  PHYSICIAN ASSISTANT: Dimitri Ped PA    ANESTHESIA:   general  EBL:     BLOOD ADMINISTERED:none  DRAINS: none   LOCAL MEDICATIONS USED:  BUPIVICAINE 20cc  SPECIMEN:  No Specimen  DISPOSITION OF SPECIMEN:  N/A  COUNTS:  YES  TOURNIQUET:  * No tourniquets in log *  DICTATION: .Other Dictation: Dictation Number [id number]  PLAN OF CARE: Admit for overnight observation  PATIENT DISPOSITION:  PACU - hemodynamically stable.   Delay start of Pharmacological VTE agent (>24hrs) due to surgical blood loss or risk of bleeding: yes

## 2011-11-05 NOTE — Anesthesia Preprocedure Evaluation (Signed)
Anesthesia Evaluation  Patient identified by MRN, date of birth, ID band Patient awake    Reviewed: Allergy & Precautions, H&P , NPO status , Patient's Chart, lab work & pertinent test results  Airway Mallampati: II TM Distance: >3 FB Neck ROM: full    Dental No notable dental hx. (+) Teeth Intact and Dental Advisory Given   Pulmonary neg pulmonary ROS,  breath sounds clear to auscultation  Pulmonary exam normal       Cardiovascular Exercise Tolerance: Good hypertension, Pt. on medications negative cardio ROS  Rhythm:regular Rate:Normal     Neuro/Psych negative neurological ROS  negative psych ROS   GI/Hepatic negative GI ROS, Neg liver ROS, hiatal hernia, PUD, GERD-  Medicated and Controlled,  Endo/Other  negative endocrine ROSDiabetes mellitus-, Well Controlled, Type 2, Oral Hypoglycemic AgentsDiet DM  Renal/GU negative Renal ROS  negative genitourinary   Musculoskeletal negative musculoskeletal ROS (+)   Abdominal   Peds negative pediatric ROS (+)  Hematology negative hematology ROS (+)   Anesthesia Other Findings   Reproductive/Obstetrics negative OB ROS                           Anesthesia Physical Anesthesia Plan  ASA: III  Anesthesia Plan: General   Post-op Pain Management:    Induction: Intravenous  Airway Management Planned: Oral ETT  Additional Equipment:   Intra-op Plan:   Post-operative Plan: Extubation in OR  Informed Consent: I have reviewed the patients History and Physical, chart, labs and discussed the procedure including the risks, benefits and alternatives for the proposed anesthesia with the patient or authorized representative who has indicated his/her understanding and acceptance.   Dental Advisory Given  Plan Discussed with: Surgeon  Anesthesia Plan Comments:         Anesthesia Quick Evaluation

## 2011-11-05 NOTE — Transfer of Care (Signed)
Immediate Anesthesia Transfer of Care Note  Patient: Logan Bean  Procedure(s) Performed: Procedure(s) (LRB): ROTATOR CUFF REPAIR SHOULDER OPEN (Left)  Patient Location: PACU  Anesthesia Type: General  Level of Consciousness: awake, oriented and patient cooperative  Airway & Oxygen Therapy: Patient Spontanous Breathing and Patient connected to face mask oxygen  Post-op Assessment: Report given to PACU RN and Post -op Vital signs reviewed and stable  Post vital signs: Reviewed and stable  Complications: No apparent anesthesia complications

## 2011-11-05 NOTE — Interval H&P Note (Signed)
History and Physical Interval Note:  11/05/2011 10:17 AM  Logan Bean  has presented today for surgery, with the diagnosis of Left Shoulder Rotator Cuff Tear  The various methods of treatment have been discussed with the patient and family. After consideration of risks, benefits and other options for treatment, the patient has consented to  Procedure(s) (LRB): ROTATOR CUFF REPAIR SHOULDER OPEN (Left) as a surgical intervention .  The patients' history has been reviewed, patient examined, no change in status, stable for surgery.  I have reviewed the patients' chart and labs.  Questions were answered to the patient's satisfaction.     Merida Alcantar A

## 2011-11-05 NOTE — Anesthesia Postprocedure Evaluation (Signed)
  Anesthesia Post-op Note  Patient: Logan Bean  Procedure(s) Performed: Procedure(s) (LRB): ROTATOR CUFF REPAIR SHOULDER OPEN (Left)  Patient Location: PACU  Anesthesia Type: General  Level of Consciousness: awake and alert   Airway and Oxygen Therapy: Patient Spontanous Breathing  Post-op Pain: mild  Post-op Assessment: Post-op Vital signs reviewed, Patient's Cardiovascular Status Stable, Respiratory Function Stable, Patent Airway and No signs of Nausea or vomiting  Post-op Vital Signs: stable  Complications: No apparent anesthesia complications

## 2011-11-06 MED ORDER — METHOCARBAMOL 500 MG PO TABS: 500.0000 mg | ORAL_TABLET | Freq: Four times a day (QID) | ORAL | Status: AC | PRN

## 2011-11-06 MED ORDER — OXYCODONE-ACETAMINOPHEN 5-325 MG PO TABS: 1.0000 | ORAL_TABLET | ORAL | Status: AC | PRN

## 2011-11-06 NOTE — Progress Notes (Signed)
Occupational Therapy Note Order noted. Eval in shadow chart. All education completed with pt and caregiver. No further OT needs. Judithann Sauger OTR/L [phone] 11/06/2011

## 2011-11-06 NOTE — Progress Notes (Signed)
Pt verbalized understanding of discharge instructions. Pt assessment has not changed from am. Pt was given d/c forms and prescriptions. Pt had no complaints or concerns.

## 2011-11-06 NOTE — Discharge Instructions (Signed)
Rotator Cuff Injury The rotator cuff is the collective set of muscles and tendons that make up the stabilizing unit of your shoulder. This unit holds in the ball of the humerus (upper arm bone) in the socket of the scapula (shoulder blade). Injuries to this stabilizing unit most commonly come from sports or activities that cause the arm to be moved repeatedly over the head. Examples of this include throwing, weight lifting, swimming, racquet sports, or an injury such as falling on your arm. Chronic (longstanding) irritation of this unit can cause inflammation (soreness), bursitis, and eventual damage to the tendons to the point of rupture (tear). An acute (sudden) injury of the rotator cuff can result in a partial or complete tear. You may need surgery with complete tears. Small or partial rotator cuff tears may be treated conservatively with temporary immobilization, exercises and rest. Physical therapy may be needed. HOME CARE INSTRUCTIONS   Apply ice to the injury for 15 to 20 minutes 3 to 4 times per day for the first 2 days. Put the ice in a plastic bag and place a towel between the bag of ice and your skin.   If you have a shoulder immobilizer (sling and straps), do not remove it for as long as directed by your caregiver or until you see a caregiver for a follow-up examination. If you need to remove it, move your arm as little as possible.   You may want to sleep on several pillows or in a recliner at night to lessen swelling and pain.   Only take over-the-counter or prescription medicines for pain, discomfort, or fever as directed by your caregiver.   Do simple hand squeezing exercises with a soft rubber ball to decrease hand swelling.  SEEK MEDICAL CARE IF:   Pain in your shoulder increases or new pain or numbness develops in your arm, hand, or fingers.   Your hand or fingers are colder than your other hand.  SEEK IMMEDIATE MEDICAL CARE IF:   Your arm, hand, or fingers are numb or  tingling.   Your arm, hand, or fingers are increasingly swollen and painful, or turn white or blue.  Document Released: 06/27/2000 Document Revised: 06/19/2011 Document Reviewed: 06/20/2008 [facility] Patient Information 2012 [place], [place].Rotator Cuff Tear The rotator cuff is four tendons that assist in the motion of the shoulder. A rotator cuff tear is a tear in one of these four tendons. It is characterized by pain and weakness of the shoulder. The rotator cuff tendons surround the shoulder ball and socket joint (humeral head). The rotator cuff tendons attach to the shoulder blade (scapula) on one side and the upper arm bone (humerus) on the other side. The rotator cuff is essential for shoulder stability and shoulder motion. SYMPTOMS   Pain around the shoulder, often at the outer portion of the upper arm.   Pain that is worse with shoulder function, especially when reaching overhead or lifting.   Weakness of the shoulder muscles.   Aching when not using your arm; often, pain awakens you at night, especially when sleeping on the affected side.   Tenderness, swelling, warmth, or redness over the outer aspect of the shoulder.   Loss of strength.   Limited motion of the shoulder, especially reaching behind (reaching into one's back pocket) or across your body.   A crackling sound (crepitation) when moving the shoulder.   Biceps tendon pain (in the front of the shoulder) and inflammation, worse with bending the elbow or lifting.  CAUSES  Strain from sudden increase in amount or intensity of activity.   Direct blow or injury to the shoulder.   Aging, wear from from normal use.   Roof of the shoulder (acromial) spur.  RISK INCREASES WITH:   Contact sports (football, wrestling, or boxing).   Throwing or hitting sports (baseball, tennis, or volleyball).   Weightlifting and bodybuilding.   Heavy labor.   Previous injury to rotator cuff.   Failure to warm up properly before  activity.   Inadequate protective equipment.   Increasing age.   Spurring of the outer end of the scapula (acromion).   Cortisone injections.   Poor shoulder strength and flexibility.  PREVENTION  Warm up and stretch properly before activity.   Allow time for rest and recovery between practices and competition.   Maintain physical fitness:   Cardiovascular fitness.   Shoulder flexibility.   Strength and endurance of the rotator cuff muscles and muscles of the shoulder blade.   Learn and use proper technique when throwing or hitting.  PROGNOSIS Surgery is often needed. Although, symptoms may go away by themselves. RELATED COMPLICATIONS   Persistent pain that may progress to constant pain.   Shoulder stiffness, frozen shoulder syndrome, or loss of motion.   Recurrence of symptoms, especially if treated without surgery.   Inability to return to same level of sports, even with surgery.   Persistent weakness.   Risks of surgery, including infection, bleeding, injury to nerves, shoulder stiffness, weakness, re-tearing of the rotator cuff tendon.   Deltoid detachment, acromial fracture, and persistent pain.  TREATMENT Treatment involves the use of ice and medicine to reduce pain and inflammation. Strengthening and stretching exercise are usually recommended. These exercises may be completed at home or with a therapist. You may also be instructed to modify offending activities. Corticosteroid injections may be given to reduce inflammation. Surgery is usually recommended for athletes. Surgery has the best chance for a full recovery. Surgery involves:  Removal of an inflamed bursa.   Removal of an acromial spur if present.   Suturing the torn tendon back together.  Rotator cuff surgeries may be preformed either arthroscopically or through an open incision. Recovery typically takes 6 to 12 months. MEDICATION  If pain medicine is necessary, then nonsteroidal  anti-inflammatory medicines, such as aspirin and ibuprofen, or other minor pain relievers, such as acetaminophen, are often recommended.   Do not take pain medicine for 7 days before surgery.   Prescription pain relievers are usually only prescribed after surgery. Use only as directed and only as much as you need.   Corticosteroid injections may be given to reduce inflammation. However, there is a limited number of times the joint may be injected with these medicines.  HEAT AND COLD  Cold treatment (icing) relieves pain and reduces inflammation. Cold treatment should be applied for 10 to 15 minutes every 2 to 3 hours for inflammation and pain and immediately after any activity that aggravates your symptoms. Use ice packs or massage the area with a piece of ice (ice massage).   Heat treatment may be used prior to performing the stretching and strengthening activities prescribed by your caregiver, physical therapist, or athletic trainer. Use a heat pack or soak the injury in warm water.  SEEK MEDICAL CARE IF:   Symptoms get worse or do not improve in 4 to 6 weeks despite treatment.   You experience pain, numbness, or coldness in the hand.   Blue, gray, or dark color appears in the fingernails.  New, unexplained symptoms develop (drugs used in treatment may produce side effects).  Document Released: 06/30/2005 Document Revised: 06/19/2011 Document Reviewed: 10/12/2008 [facility] Patient Information 2012 [place], [place].Surgery for Rotator Cuff Tear with Rehab Rotator cuff surgery is only recommended for individuals who have experienced persistent disability for greater than 3 months of non-surgical (conservative) treatment. Surgery is not necessary but is recommended for individuals who experience difficulty completing daily activities or athletes who are unable to compete. Rotator cuff tears do not usually heal without surgical intervention. If left alone small rotator cuff tears usually become  larger. Younger athletes who have a rotator cuff tear may be recommended for surgery without attempting conservative rehabilitation. The purpose of surgery is to regain function of the shoulder joint and eliminate pain associated with the injury. In addition to repairing the tendon tear, the surgery often removes a portion of the bony roof of the shoulder (acromion) as well as the chronically thickened and inflamed membrane below the acromion (subacromial bursa). REASONS NOT TO OPERATE   Infection of the shoulder.   Inability to complete a rehabilitation program.   Patients who have other conditions (emotional or psychological) conditions that contribute to their shoulder condition.  RISKS AND COMPLICATIONS  Infection.   Re-tear of the rotator cuff tendons or muscles.   Shoulder stiffness and/or weakness.   Inability to compete in athletics.   Acromioclavicular (AC) joint paint.   Risks of surgery: infection, bleeding, nerve damage, or damage to surrounding tissues.  TECHNIQUE There are different surgical procedures used to treat rotator cuff tears. The type of procedure depends on the extent of injury as well as the surgeon's preference. All of the surgical techniques for rotator cuff tears have the same goal of repairing the torn tendon, removing part of the acromion, and removing the subacromial bursa. There are two main types of procedures: arthroscopic and open incision. Arthroscopic procedures are usually completed and you go home the same day as surgery (outpatient). These procedures use multiple small incisions in which tools and a video camera are placed to work on the shoulder. An electric shaver removes the bursa, then a power burr shaves down the portion of the acromion that places pressure on the rotator cuff. Finally the rotator cuff is sewed (sutured) back to the humeral head. Open incision procedures require a larger incision. The deltoid muscle is detached from the acromion  and a ligament in the shoulder (coracoacromial) is cut in order for the surgeon to access the rotator cuff. The subacromial bursa is removed as well as part of the acromion to give the rotator cuff room to move freely. The torn tendon is then sutured to the humeral head. After the rotator cuff is repaired, then the deltoid is reattached and the incision is closed up.  RECOVERY   Post-operative care depends on the surgical technique and the preferences of your therapist.   Keep the wound clean and dry for the first 10 to 14 days after surgery.   Keep your shoulder and arm in the sling provided to you for as long as you have been instructed to.   You will be given pain medications by your caregiver.   Passive (without using muscles) shoulder movements may be begun immediately after surgery.   It is important to follow through with you rehabilitation program in order to have the best possible recovery.  RETURN TO SPORTS   The rehabilitation period will depend on the sport and position you play as well as the success  of the operation.   The minimum recovery period is 6 months.   You must have regained complete shoulder motion and strength before returning to sports.  SEEK IMMEDIATE MEDICAL CARE IF:   Any medications produce adverse side effects.   Any complications from surgery occur:   Pain, numbness, or coldness in the extremity operated upon.   Discoloration of the nail beds (they become blue or gray) of the extremity operated upon.   Signs of infections (fever, pain, inflammation, redness, or persistent bleeding).  EXERCISES  RANGE OF MOTION (ROM) AND STRETCHING EXERCISES - Rotator Cuff Tear, Surgery For These exercises may help you restore your elbow mobility once your physician has discontinued your immobilization period. Beginning these before your provider's approval may result in delayed healing. Your symptoms may resolve with or without further involvement from your physician,  physical therapist or athletic trainer. While completing these exercises, remember:   Restoring tissue flexibility helps normal motion to return to the joints. This allows healthier, less painful movement and activity.   An effective stretch should be held for at least 30 seconds. A stretch should never be painful. You should only feel a gentle lengthening or release in the stretch.  ROM - Pendulum   Bend at the waist so that your right / left arm falls away from your body. Support yourself with your opposite hand on a solid surface, such as a table or a countertop.   Your right / left arm should be perpendicular to the ground. If it is not perpendicular, you need to lean over farther. Relax the muscles in your right / left arm and shoulder as much as possible.   Gently sway your hips and trunk so they move your right / left arm without any use of your right / left shoulder muscles.   Progress your movements so that your right / left arm moves side to side, then forward and backward, and finally, both clockwise and counterclockwise.   Complete __________ repetitions in each direction. Many people use this exercise to relieve discomfort in their shoulder as well as to gain range of motion.  Repeat __________ times. Complete this exercise __________ times per day. STRETCH - Flexion, Seated   Sit in a firm chair so that your right / left forearm can rest on a table or on a table or countertop. Your right / left elbow should rest below the height of your shoulder so that your shoulder feels supported and not tense or uncomfortable.   Keeping your right / left shoulder relaxed, lean forward at your waist, allowing your right / left hand to slide forward. Bend forward until you feel a moderate stretch in your shoulder, but before you feel an increase in your pain.   Hold __________ seconds. Slowly return to your starting position.  Repeat __________ times. Complete this exercise __________ times  per day.  STRETCH - Flexion, Standing   Stand with good posture. With an underhand grip on your right / left and an overhand grip on the opposite hand, grasp a broomstick or cane so that your hands are a little more than shoulder-width apart.   Keeping your right / left elbow straight and shoulder muscles relaxed, push the stick with your opposite hand to raise your right / left arm in front of your body and then overhead. Raise your arm until you feel a stretch in your right / left shoulder, but before you have increased shoulder pain.   Try to avoid shrugging  your right / left shoulder as your arm rises by keeping your shoulder blade tucked down and toward your mid-back spine. Hold __________ seconds.   Slowly return to the starting position.  Repeat __________ times. Complete this exercise __________ times per day.  STRETCH - Abduction, Supine   Stand with good posture. With an underhand grip on your right / left and an overhand grip on the opposite hand, grasp a broomstick or cane so that your hands are a little more than shoulder-width apart.   Keeping your right / left elbow straight and shoulder muscles relaxed, push the stick with your opposite hand to raise your right / left arm out to the side of your body and then overhead. Raise your arm until you feel a stretch in your right / left shoulder, but before you have increased shoulder pain.   Try to avoid shrugging your right / left shoulder as your arm rises by keeping your shoulder blade tucked down and toward your mid-back spine. Hold __________ seconds.   Slowly return to the starting position.  Repeat __________ times. Complete this exercise __________ times per day.  ROM - Flexion, Active-Assisted  Lie on your back. You may bend your knees for comfort.   Grasp a broomstick or cane so your hands are about shoulder-width apart. Your right / left hand should grip the end of the stick/cane so that your hand is positioned  "thumbs-up," as if you were about to shake hands.   Using your healthy arm to lead, raise your right / left arm overhead until you feel a gentle stretch in your shoulder. Hold __________ seconds.   Use the stick/cane to assist in returning your right / left arm to its starting position.  Repeat __________ times. Complete this exercise __________ times per day.  STRETCH - External Rotation   Tuck a folded towel or small ball under your right / left upper arm. Grasp a broomstick or cane with an underhand grasp a little more than shoulder width apart. Bend your elbows to 90 degrees.   Stand with good posture or sit in a chair without arms.   Use your strong arm to push the stick across your body. Do not allow the towel or ball to fall. This will rotate your right / left arm away from your abdomen. Using the stick turn/rotate your hand and forearm away from your body. Hold __________ seconds.  Repeat __________ times. Complete this exercise __________ times per day.  STRENGTHENING EXERCISES - Rotator Cuff Tear, Surgery For These exercises may help you begin to restore your elbow strength in the initial stage of your rehabilitation. Your physician will determine when you begin these exercises depending on the severity of your injury and the integrity of your repaired tissues. Beginning these before your provider's approval may result in delayed healing. While completing these exercises, remember:   Muscles can gain both the endurance and the strength needed for everyday activities through controlled exercises.   Complete these exercises as instructed by your physician, physical therapist or athletic trainer. Progress the resistance and repetitions only as guided.   You may experience muscle soreness or fatigue, but the pain or discomfort you are trying to eliminate should never worsen during these exercises. If this pain does worsen, stop and make certain you are following the directions exactly. If  the pain is still present after adjustments, discontinue the exercise until you can discuss the trouble with your clinician.  STRENGTH - Shoulder Flexion, Isometric  With good posture and facing a wall, stand or sit about 4-6 inches away.   Keeping your right / left elbow straight, gently press the top of your fist into the wall. Increase the pressure gradually until you are pressing as hard as you can without shrugging your shoulder or increasing any shoulder discomfort.   Hold __________ seconds. Release the tension slowly. Relax your shoulder muscles completely before you do the next repetition.  Repeat __________ times. Complete this exercise __________ times per day.  STRENGTH - Shoulder Abductors, Isometric   With good posture, stand or sit about 4-6 inches from a wall with your right / left side facing the wall.   Bend your right / left elbow. Gently press your right / left elbow into the wall. Increase the pressure gradually until you are pressing as hard as you can without shrugging your shoulder or increasing any shoulder discomfort.   Hold __________ seconds.   Release the tension slowly. Relax your shoulder muscles completely before you do the next repetition.  Repeat __________ times. Complete this exercise __________ times per day.  STRENGTH - Internal Rotators, Isometric   Keep your right / left elbow at your side and bend it 90 degrees.   Step into a door frame so that the inside of your right / left wrist can press against the door frame without your upper arm leaving your side.   Gently press your right / left wrist into the door frame as if you were trying to draw the palm of your hand to your abdomen. Gradually increase the tension until you are pressing as hard as you can without shrugging your shoulder or increasing any shoulder discomfort.   Hold __________ seconds.   Release the tension slowly. Relax your shoulder muscles completely before you do the next  repetition.  Repeat __________ times. Complete this exercise __________ times per day.  STRENGTH - External Rotators, Isometric   Keep your right / left elbow at your side and bend it 90 degrees.   Step into a door frame so that the outside of your right / left wrist can press against the door frame without your upper arm leaving your side.   Gently press your right / left wrist into the door frame as if you were trying to swing the back of your hand away from your abdomen. Gradually increase the tension until you are pressing as hard as you can without shrugging your shoulder or increasing any shoulder discomfort.   Hold __________ seconds.   Release the tension slowly. Relax your shoulder muscles completely before you do the next repetition.  Repeat __________ times. Complete this exercise __________ times per day.  Document Released: 06/30/2005 Document Revised: 06/19/2011 Document Reviewed: 10/12/2008 [facility] Patient Information 2012 [place], [place].Biceps Tendon Subluxation Tendons attach muscle to bone. The bicep muscles (the muscles that flex the forearm at the elbow joint and assist in flexing the arm at the shoulder joint) have three tendons. One tendon attaches at the elbow and the other two at the shoulder. One of the tendons that attaches at the shoulder (the long head) runs through a groove in the bone before it enters the shoulder joint. The groove is covered by the transverse humeral ligament, which helps keep the biceps tendon in the groove. Bicep tendon subluxation occurs when the tendon moves in and out of this groove. Bicep tendon subluxation usually occurs in the presence of another shoulder condition such as a tear of the subscapularis tendon (a  tendon of one of the rotator cuff muscles). SYMPTOMS  Presence of a "clunking" feeling when the arm is rotated outward passively or inwardly against resistance.   Pain and tenderness in the front of the shoulder.   Pain that  increases with shoulder and elbow motion such as bending the elbow and turning ones palm upwards against resistance.   A crackling sound (crepitation) when the shoulder is moved.  CAUSES  Injury to the rotator cuff, either traumatic or degenerative.  RISK INCREASES WITH:  Contact sports, throwing sports, weightlifting, and bodybuilding.   Heavy labor especially involving lifting.   Poor strength and flexibility.   Failure to warm-up properly before practice or play.  PREVENTION  Appropriately warm up and stretch before practice or competition.   Allow time for rest and recovery.   Maintain appropriate conditioning:   Shoulder and elbow flexibility.   Muscle strength and endurance.   Cardiovascular fitness.   Learn and use proper technique, especially in throwing sports.  PROGNOSIS  Surgical repair of the rotator cuff with repair of the transverse ligament typically reinstates full stability of the biceps tendon.  RELATED COMPLICATIONS   If activity is begun too early.   Recurrent symptoms.   Prolonged healing time.   Persistent subluxation with shoulder and elbow function.   Weakness of elbow bending and forearm rotation.   Prolonged disability (uncommon).   Shoulder pain.   Stiffness or loss of motion of the shoulder.  TREATMENT Initially, pain should be managed with non-prescription medication and ice. Individuals should begin stretching exercises and learn proper technique for the activity that caused the injury. The exercises may be conducted at home or under the supervision of a physical therapist. Typically surgical repair of the rotator cuff and reconstruction of the transverse humeral ligament are recommended. On occasion, cutting of the bicep tendon and stitching (suturing) it to the bone of the upper arm (tenodesis) may be performed. MEDICATION  If pain medication is necessary, nonsteroidal anti-inflammatory medications, such as aspirin and ibuprofen, or  other minor pain relievers, such as acetaminophen, are often recommended.   Do not take pain medication for 7 days before surgery.   Prescription pain relievers are usually only prescribed after surgery. Use only as directed and only as much as you need.  HEAT AND COLD   Cold treatment (icing) relieves pain and reduces inflammation. Cold treatment should be applied for 10 to 15 minutes every 2 to 3 hours for inflammation and pain and immediately after any activity that aggravates your symptoms. Use ice packs or an ice massage.   Heat treatment may be used prior to performing the stretching and strengthening activities prescribed by your caregiver, physical therapist, or athletic trainer. Use a heat pack or a warm soak.  SEEK MEDICAL CARE IF:   Symptoms get worse or do not improve in 4 to 6 weeks despite treatment.   New, unexplained symptoms develop (drugs used in treatment may produce side effects).  Document Released: 06/30/2005 Document Revised: 06/19/2011 Document Reviewed: 10/12/2008 [facility] Patient Information 2012 [place], [place].

## 2011-11-06 NOTE — Progress Notes (Signed)
Subjective: Doing very well this morning. Waiting on OT and then will DC   Objective: Vital signs in last 24 hours: Temp:  [96.8 F (36 C)-98 F (36.7 C)] 97.8 F (36.6 C) (04/25 0537) Pulse Rate:  [65-87] 65  (04/25 0537) Resp:  [12-16] 16  (04/25 0537) BP: (137-175)/(55-81) 141/78 mmHg (04/25 0537) SpO2:  [97 %-99 %] 99 % (04/25 0537) Weight:  [72.2 kg (159 lb 2.8 oz)] 72.2 kg (159 lb 2.8 oz) (04/25 0537)  Intake/Output from previous day: 04/24 0701 - 04/25 0700 In: 1640 [P.O.:240; I.V.:1400] Out: 1150 [Urine:1150] Intake/Output this shift:    No results found for this basename: HGB:5 in the last 72 hours No results found for this basename: WBC:2,RBC:2,HCT:2,PLT:2 in the last 72 hours No results found for this basename: NA:2,K:2,CL:2,CO2:2,BUN:2,CREATININE:2,GLUCOSE:2,CALCIUM:2 in the last 72 hours No results found for this basename: LABPT:2,INR:2 in the last 72 hours  Neurovascular intact  Assessment/Plan: DC Today   Logan Bean A 11/06/2011, 7:29 AM

## 2011-11-06 NOTE — Op Note (Signed)
[name], Logan Bean              ACCOUNT NO.:  [id number]  MEDICAL RECORD NO.:  [id number]  LOCATION:  1314                         FACILITY:  [facility]  PHYSICIAN:  Georges Lynch. Yaniel Limbaugh, M.D.DATE OF BIRTH:  1920/10/06  DATE OF PROCEDURE:  11/05/2011 DATE OF DISCHARGE:                              OPERATIVE REPORT   SURGEON:  Georges Lynch. Roben Schliep, M.D.  ASSISTANT:  Dimitri Ped, [place]  PREOPERATIVE DIAGNOSES: 1. A complete retracted tear of the rotator cuff tendon, left     shoulder. 2. Severe impingement of the left shoulder.  POSTOPERATIVE DIAGNOSES: 1. A complete retracted tear of the rotator cuff tendon, left     shoulder. 2. Severe impingement of the left shoulder.  OPERATION: 1. Open acromionectomy of left shoulder. 2. Open repair of a complete retracted tear of the rotator cuff     tendon, left shoulder.  PROCEDURE IN DETAIL:  Under general anesthesia, routine orthopedic prep and draping of the left upper extremities was carried out.  The patient's appropriate time-out was carried out prior to making incision. I also marked the appropriate left arm in the holding area.  The patient had 1 g of IV Ancef.  At this time, an incision was made over the anterior aspect of left shoulder.  Bleeders identified and cauterized. I then went down, detached the deltoid tendon from the acromion.  The acromion was severely thickened and down sloped.  I protected the underlying rotator cuff with a Bennett retractor utilizing oscillating saw and did a partial acromionectomy.  I then utilized a bur to even out the undersurface of the acromion.  Following that, I then burred the lateral articular surface of the humerus, literally with bear.  I then irrigated the area out.  I identified the long head of the biceps.  I was able to free up the tendon by finger dissection all the way back posterior.  I put a clamp on the tendon, brought it forward and had to use the biceps tendon for  reinforcement.  Once I did that and had it checked forward, I was then able to place an anchor in the proximal humerus which was extremely osteoporotic.  The anchor was stable.  I then brought the sutures up through the cuff and repaired the cuff to the humeral head where we burred down the lateral articular surface. Following that, I then reinforced it with a TissueMend graft in the usual fashion.  Thoroughly irrigated out the area and then closed the wound layers in usual fashion.  I injected 20 cc of Exparel into the wound site.  I did aspirate to make sure we were not in any vessels.  Subcu was closed with 0 Vicryl, skin with metal staples.  Sterile Neosporin dressing applied.  The patient was placed in a shoulder immobilizer.          ______________________________ Georges Lynch Darrelyn Hillock, M.D.     RAG/MEDQ  D:  11/05/2011  T:  11/06/2011  Job:  [id number]

## 2011-11-07 NOTE — Discharge Summary (Signed)
Physician Discharge Summary   Patient ID: Logan Bean MRN: [id number] DOB/AGE: [birth year] 78 y.o.  Admit date: 11/05/2011 Discharge date: 11/07/2011  Primary Diagnosis:  Complete rotator cuff tear, left shoulder  Admission Diagnoses:  Past Medical History  Diagnosis Date  . Hypertension   . Hypercholesteremia   . GERD (gastroesophageal reflux disease)   . Stomach ulcer   . H/O hiatal hernia   . Varicose veins     both legs  . Arthritis     left knee  . Left rotator cuff tear Jul 12, 2012  . Diabetes mellitus     diet controlled, pt does not check cbg   Discharge Diagnoses:   Active Problems:  Complete rotator cuff tear or rupture of left shoulder, not specified as traumatic S/P open rotator cuff repair, left  Procedure:  Procedure(s) (LRB): ROTATOR CUFF REPAIR SHOULDER OPEN (Left)   Consults: None  HPI: The patient is a 78 year old male who first presented to Dr. Darrelyn Hillock with a shoulder dislocation in February of 2013 after he feel in [employer]. He went through physical therapy and continued to have pain and weakness in the left shoulder. MRI was ordered which showed that he has massive tears of the left rotator cuff. Most predictable means for decreased pain and increased function in the left shoulder is an open rotator cuff repair of the left rotator cuff.     Laboratory Data: Hospital Outpatient Visit on 10/27/2011  Component Date Value Range Status  . Sodium (mEq/L) 10/27/2011 140  135-145 Final  . Potassium (mEq/L) 10/27/2011 4.3  3.5-5.1 Final  . Chloride (mEq/L) 10/27/2011 104  96-112 Final  . CO2 (mEq/L) 10/27/2011 27  19-32 Final  . Glucose, Bld (mg/dL) 86/57/8469 85  62-95 Final  . BUN (mg/dL) 28/41/3244 31* 0-10 Final  . Creatinine, Ser (mg/dL) 27/25/3664 4.03  4.74-2.59 Final  . Calcium (mg/dL) 56/38/7564 33.2  9.5-18.8 Final  . GFR calc non Af Amer (mL/min) 10/27/2011 79* >90 Final  . GFR calc Af Amer (mL/min) 10/27/2011 >90  >90 Final   Comment:                                 The eGFR has been calculated                          using the CKD EPI equation.                          This calculation has not been                          validated in all clinical                          situations.                          eGFR's persistently                          <90 mL/min signify                          possible Chronic Kidney Disease.  Marland Kitchen MRSA, PCR  10/27/2011 NEGATIVE  NEGATIVE Final  . Staphylococcus aureus  10/27/2011 POSITIVE* NEGATIVE Final   Comment:                                 The Xpert SA Assay (FDA                          approved for NASAL specimens                          only), is one component of                          a comprehensive surveillance                          program.  It is not intended                          to diagnose infection nor to                          guide or monitor treatment.  . WBC (K/uL) 10/27/2011 6.2  4.0-10.5 Final  . RBC (MIL/uL) 10/27/2011 5.27  4.22-5.81 Final  . Hemoglobin (g/dL) 16/04/9603 54.0  98.1-19.1 Final  . HCT (%) 10/27/2011 45.9  39.0-52.0 Final  . MCV (fL) 10/27/2011 87.1  78.0-100.0 Final  . MCH (pg) 10/27/2011 28.1  26.0-34.0 Final  . MCHC (g/dL) 47/82/9562 13.0  86.5-78.4 Final  . RDW (%) 10/27/2011 14.3  11.5-15.5 Final  . Platelets (K/uL) 10/27/2011 258  150-400 Final  . Neutrophils Relative (%) 10/27/2011 64  43-77 Final  . Neutro Abs (K/uL) 10/27/2011 4.0  1.7-7.7 Final  . Lymphocytes Relative (%) 10/27/2011 25  12-46 Final  . Lymphs Abs (K/uL) 10/27/2011 1.6  0.7-4.0 Final  . Monocytes Relative (%) 10/27/2011 8  3-12 Final  . Monocytes Absolute (K/uL) 10/27/2011 0.5  0.1-1.0 Final  . Eosinophils Relative (%) 10/27/2011 2  0-5 Final  . Eosinophils Absolute (K/uL) 10/27/2011 0.1  0.0-0.7 Final  . Basophils Relative (%) 10/27/2011 1  0-1 Final  . Basophils Absolute (K/uL) 10/27/2011 0.0  0.0-0.1 Final    X-Rays:No results  found.   Hospital Course: Patient was admitted to [facility] and taken to the OR and underwent the above state procedure without complications.  Patient tolerated the procedure well and was later transferred to the recovery room and then to the orthopaedic floor for postoperative care.  They were given PO and IV analgesics for pain control following their surgery.  OT were ordered for sling care and management education.  Discharge planning consulted to help with postop disposition and equipment needs.  Patient had a good night on the evening of surgery. Pain was well-controlled. Patient was seen in rounds and was ready to go home after instruction from OT.   Discharge Medications: Prior to Admission medications   Medication Sig Start Date End Date Taking? Authorizing Provider  amLODipine (NORVASC) 5 MG tablet Take 5 mg by mouth at bedtime.    Yes Historical Provider, MD  aspirin EC 81 MG tablet Take 81 mg by mouth daily.    Yes Historical Provider, MD  fish oil-omega-3 fatty acids 1000 MG capsule Take 1 g by mouth daily.  Yes Historical Provider, MD  glucosamine-chondroitin 500-400 MG tablet Take 1 tablet by mouth daily.    Yes Historical Provider, MD  lisinopril (PRINIVIL,ZESTRIL) 20 MG tablet Take 20 mg by mouth at bedtime.    Yes Historical Provider, MD  Multiple Vitamin (MULITIVITAMIN WITH MINERALS) TABS Take 1 tablet by mouth daily.   Yes Historical Provider, MD  omeprazole (PRILOSEC) 20 MG capsule Take 20 mg by mouth every morning.    Yes Historical Provider, MD  simvastatin (ZOCOR) 20 MG tablet Take 20 mg by mouth at bedtime.    Yes Historical Provider, MD  methocarbamol (ROBAXIN) 500 MG tablet Take 1 tablet (500 mg total) by mouth every 6 (six) hours as needed. 11/06/11 11/16/11  Henri Baumler Tamala Ser, PA  oxyCODONE-acetaminophen (PERCOCET) 5-325 MG per tablet Take 1-2 tablets by mouth every 4 (four) hours as needed. 11/06/11 11/16/11  Akire Rennert Tamala Ser, PA    Diet: heart  healthy Activity:Always wear sling on left arm Follow-up:in 2 weeks Disposition - Home Discharged Condition: good   Discharge Orders    Future Orders Please Complete By Expires   Diet - low sodium heart healthy      Call MD / Call 911      Comments:   If you experience chest pain or shortness of breath, CALL 911 and be transported to the hospital emergency room.  If you develope a fever above 101 F, pus (white drainage) or increased drainage or redness at the wound, or calf pain, call your surgeon's office.   Constipation Prevention      Comments:   Drink plenty of fluids.  Prune juice may be helpful.  You may use a stool softener, such as Colace (over the counter) 100 mg twice a day.  Use MiraLax (over the counter) for constipation as needed.   Increase activity slowly as tolerated      Comments:   Always wear sling on left arm.   Discharge instructions      Comments:   Keep your sling on at all times, including sleeping in your sling. The only time you should remove your sling is to shower only but you need to keep your hand against your chest while you shower.  For the first few days, remove your dressing, tape a piece of saran wrap over your incision, take your shower, then remove the saran wrap and put a clean dressing on, then reapply your sling. After two days you can shower without the saran wrap.  Call Dr. Darrelyn Hillock if any wound complications or temperature of 101 degrees F or over.  Call the office for an appointment to see Dr. Darrelyn Hillock in two weeks: [phone] and ask for Dr. Jeannetta Ellis nurse, Mackey Birchwood.   Driving restrictions      Comments:   No driving for 2 weeks   Lifting restrictions      Comments:   No lifting     Medication List  As of 11/07/2011  2:34 PM   TAKE these medications         amLODipine 5 MG tablet   Commonly known as: NORVASC   Take 5 mg by mouth at bedtime.      aspirin EC 81 MG tablet   Take 81 mg by mouth daily.      fish oil-omega-3  fatty acids 1000 MG capsule   Take 1 g by mouth daily.      glucosamine-chondroitin 500-400 MG tablet   Take 1 tablet by mouth daily.  lisinopril 20 MG tablet   Commonly known as: PRINIVIL,ZESTRIL   Take 20 mg by mouth at bedtime.      methocarbamol 500 MG tablet   Commonly known as: ROBAXIN   Take 1 tablet (500 mg total) by mouth every 6 (six) hours as needed.      mulitivitamin with minerals Tabs   Take 1 tablet by mouth daily.      omeprazole 20 MG capsule   Commonly known as: PRILOSEC   Take 20 mg by mouth every morning.      oxyCODONE-acetaminophen 5-325 MG per tablet   Commonly known as: PERCOCET   Take 1-2 tablets by mouth every 4 (four) hours as needed.      simvastatin 20 MG tablet   Commonly known as: ZOCOR   Take 20 mg by mouth at bedtime.             SignedCeledonio Savage, Juniper Cobey LAUREN 11/07/2011, 2:34 PM

## 2011-11-10 ENCOUNTER — Encounter (HOSPITAL_COMMUNITY): Payer: Self-pay | Admitting: Orthopedic Surgery

## 2011-12-22 DIAGNOSIS — S43429A Sprain of unspecified rotator cuff capsule, initial encounter: Secondary | ICD-10-CM | POA: Diagnosis not present

## 2011-12-25 DIAGNOSIS — S43429A Sprain of unspecified rotator cuff capsule, initial encounter: Secondary | ICD-10-CM | POA: Diagnosis not present

## 2011-12-29 DIAGNOSIS — S43429A Sprain of unspecified rotator cuff capsule, initial encounter: Secondary | ICD-10-CM | POA: Diagnosis not present

## 2011-12-31 DIAGNOSIS — M109 Gout, unspecified: Secondary | ICD-10-CM | POA: Diagnosis not present

## 2011-12-31 DIAGNOSIS — I10 Essential (primary) hypertension: Secondary | ICD-10-CM | POA: Diagnosis not present

## 2011-12-31 DIAGNOSIS — E138 Other specified diabetes mellitus with unspecified complications: Secondary | ICD-10-CM | POA: Diagnosis not present

## 2011-12-31 DIAGNOSIS — R609 Edema, unspecified: Secondary | ICD-10-CM | POA: Diagnosis not present

## 2011-12-31 DIAGNOSIS — E1365 Other specified diabetes mellitus with hyperglycemia: Secondary | ICD-10-CM | POA: Diagnosis not present

## 2012-01-01 DIAGNOSIS — S43429A Sprain of unspecified rotator cuff capsule, initial encounter: Secondary | ICD-10-CM | POA: Diagnosis not present

## 2012-01-05 DIAGNOSIS — S43429A Sprain of unspecified rotator cuff capsule, initial encounter: Secondary | ICD-10-CM | POA: Diagnosis not present

## 2012-01-08 DIAGNOSIS — S43429A Sprain of unspecified rotator cuff capsule, initial encounter: Secondary | ICD-10-CM | POA: Diagnosis not present

## 2012-01-12 DIAGNOSIS — S43429A Sprain of unspecified rotator cuff capsule, initial encounter: Secondary | ICD-10-CM | POA: Diagnosis not present

## 2012-01-16 DIAGNOSIS — S43429A Sprain of unspecified rotator cuff capsule, initial encounter: Secondary | ICD-10-CM | POA: Diagnosis not present

## 2012-01-19 DIAGNOSIS — S43429A Sprain of unspecified rotator cuff capsule, initial encounter: Secondary | ICD-10-CM | POA: Diagnosis not present

## 2012-01-19 DIAGNOSIS — M1710 Unilateral primary osteoarthritis, unspecified knee: Secondary | ICD-10-CM | POA: Diagnosis not present

## 2012-01-19 DIAGNOSIS — M25569 Pain in unspecified knee: Secondary | ICD-10-CM | POA: Diagnosis not present

## 2012-01-22 DIAGNOSIS — S43429A Sprain of unspecified rotator cuff capsule, initial encounter: Secondary | ICD-10-CM | POA: Diagnosis not present

## 2012-01-27 DIAGNOSIS — S43429A Sprain of unspecified rotator cuff capsule, initial encounter: Secondary | ICD-10-CM | POA: Diagnosis not present

## 2012-01-30 DIAGNOSIS — S43429A Sprain of unspecified rotator cuff capsule, initial encounter: Secondary | ICD-10-CM | POA: Diagnosis not present

## 2012-03-19 DIAGNOSIS — R5383 Other fatigue: Secondary | ICD-10-CM | POA: Diagnosis not present

## 2012-03-19 DIAGNOSIS — E1365 Other specified diabetes mellitus with hyperglycemia: Secondary | ICD-10-CM | POA: Diagnosis not present

## 2012-03-19 DIAGNOSIS — E559 Vitamin D deficiency, unspecified: Secondary | ICD-10-CM | POA: Diagnosis not present

## 2012-03-19 DIAGNOSIS — E785 Hyperlipidemia, unspecified: Secondary | ICD-10-CM | POA: Diagnosis not present

## 2012-03-19 DIAGNOSIS — I10 Essential (primary) hypertension: Secondary | ICD-10-CM | POA: Diagnosis not present

## 2012-03-23 DIAGNOSIS — Z23 Encounter for immunization: Secondary | ICD-10-CM | POA: Diagnosis not present

## 2012-03-23 DIAGNOSIS — R609 Edema, unspecified: Secondary | ICD-10-CM | POA: Diagnosis not present

## 2012-03-23 DIAGNOSIS — E138 Other specified diabetes mellitus with unspecified complications: Secondary | ICD-10-CM | POA: Diagnosis not present

## 2012-03-23 DIAGNOSIS — I10 Essential (primary) hypertension: Secondary | ICD-10-CM | POA: Diagnosis not present

## 2012-03-23 DIAGNOSIS — E1365 Other specified diabetes mellitus with hyperglycemia: Secondary | ICD-10-CM | POA: Diagnosis not present

## 2012-06-22 DIAGNOSIS — M19019 Primary osteoarthritis, unspecified shoulder: Secondary | ICD-10-CM | POA: Diagnosis not present

## 2012-07-12 DIAGNOSIS — M75102 Unspecified rotator cuff tear or rupture of left shoulder, not specified as traumatic: Secondary | ICD-10-CM

## 2012-07-12 HISTORY — DX: Unspecified rotator cuff tear or rupture of left shoulder, not specified as traumatic: M75.102

## 2012-07-22 DIAGNOSIS — I10 Essential (primary) hypertension: Secondary | ICD-10-CM | POA: Diagnosis not present

## 2012-07-22 DIAGNOSIS — E1365 Other specified diabetes mellitus with hyperglycemia: Secondary | ICD-10-CM | POA: Diagnosis not present

## 2012-07-22 DIAGNOSIS — E785 Hyperlipidemia, unspecified: Secondary | ICD-10-CM | POA: Diagnosis not present

## 2012-07-22 DIAGNOSIS — M109 Gout, unspecified: Secondary | ICD-10-CM | POA: Diagnosis not present

## 2012-07-22 DIAGNOSIS — E559 Vitamin D deficiency, unspecified: Secondary | ICD-10-CM | POA: Diagnosis not present

## 2012-07-26 DIAGNOSIS — M19019 Primary osteoarthritis, unspecified shoulder: Secondary | ICD-10-CM | POA: Diagnosis not present

## 2012-07-27 DIAGNOSIS — E785 Hyperlipidemia, unspecified: Secondary | ICD-10-CM | POA: Diagnosis not present

## 2012-07-27 DIAGNOSIS — I10 Essential (primary) hypertension: Secondary | ICD-10-CM | POA: Diagnosis not present

## 2012-07-27 DIAGNOSIS — E1365 Other specified diabetes mellitus with hyperglycemia: Secondary | ICD-10-CM | POA: Diagnosis not present

## 2012-07-27 DIAGNOSIS — E138 Other specified diabetes mellitus with unspecified complications: Secondary | ICD-10-CM | POA: Diagnosis not present

## 2012-07-27 DIAGNOSIS — M109 Gout, unspecified: Secondary | ICD-10-CM | POA: Diagnosis not present

## 2012-08-17 DIAGNOSIS — M19019 Primary osteoarthritis, unspecified shoulder: Secondary | ICD-10-CM | POA: Diagnosis not present

## 2012-08-19 DIAGNOSIS — M19019 Primary osteoarthritis, unspecified shoulder: Secondary | ICD-10-CM | POA: Diagnosis not present

## 2012-09-03 ENCOUNTER — Encounter (HOSPITAL_COMMUNITY): Payer: Self-pay | Admitting: Pharmacist

## 2012-09-11 ENCOUNTER — Encounter (HOSPITAL_COMMUNITY): Payer: Self-pay | Admitting: Emergency Medicine

## 2012-09-11 ENCOUNTER — Emergency Department (HOSPITAL_COMMUNITY): Payer: Medicare Other

## 2012-09-11 ENCOUNTER — Emergency Department (HOSPITAL_COMMUNITY)
Admission: EM | Admit: 2012-09-11 | Discharge: 2012-09-11 | Disposition: A | Discharge status: Home / Self Care | Payer: Medicare Other | Attending: Emergency Medicine | Admitting: Emergency Medicine

## 2012-09-11 DIAGNOSIS — Z87828 Personal history of other (healed) physical injury and trauma: Secondary | ICD-10-CM | POA: Insufficient documentation

## 2012-09-11 DIAGNOSIS — Z8739 Personal history of other diseases of the musculoskeletal system and connective tissue: Secondary | ICD-10-CM | POA: Diagnosis not present

## 2012-09-11 DIAGNOSIS — Z8719 Personal history of other diseases of the digestive system: Secondary | ICD-10-CM | POA: Diagnosis not present

## 2012-09-11 DIAGNOSIS — Z8679 Personal history of other diseases of the circulatory system: Secondary | ICD-10-CM | POA: Insufficient documentation

## 2012-09-11 DIAGNOSIS — Z7982 Long term (current) use of aspirin: Secondary | ICD-10-CM | POA: Insufficient documentation

## 2012-09-11 DIAGNOSIS — G8929 Other chronic pain: Secondary | ICD-10-CM | POA: Diagnosis not present

## 2012-09-11 DIAGNOSIS — E7800 Pure hypercholesterolemia, unspecified: Secondary | ICD-10-CM | POA: Insufficient documentation

## 2012-09-11 DIAGNOSIS — Z79899 Other long term (current) drug therapy: Secondary | ICD-10-CM | POA: Insufficient documentation

## 2012-09-11 DIAGNOSIS — E119 Type 2 diabetes mellitus without complications: Secondary | ICD-10-CM | POA: Diagnosis not present

## 2012-09-11 DIAGNOSIS — Z87891 Personal history of nicotine dependence: Secondary | ICD-10-CM | POA: Insufficient documentation

## 2012-09-11 DIAGNOSIS — I10 Essential (primary) hypertension: Secondary | ICD-10-CM | POA: Insufficient documentation

## 2012-09-11 DIAGNOSIS — M1710 Unilateral primary osteoarthritis, unspecified knee: Secondary | ICD-10-CM | POA: Insufficient documentation

## 2012-09-11 MED ORDER — OXYCODONE-ACETAMINOPHEN 5-325 MG PO TABS: 2.0000 | ORAL_TABLET | ORAL | Status: DC | PRN

## 2012-09-11 MED ORDER — OXYCODONE-ACETAMINOPHEN 5-325 MG PO TABS
2.0000 | ORAL_TABLET | Freq: Once | ORAL | Status: AC
  Administered 2012-09-11: 2 via ORAL
  Filled 2012-09-11: qty 2

## 2012-09-11 NOTE — ED Notes (Signed)
[id number]<AV> Expected date:<BR> Expected time:<BR> Means of arrival:<BR> Comments:<BR> EMS-leg pain

## 2012-09-11 NOTE — ED Notes (Signed)
Per EMS - Pt woke w/ left leg pain, which started creeping up on him since last night. Left leg pain radiates between knee and hip, coming to a standing position is very painful, feels better when he is standing up straight. Pt Rx'ed w/ oxy at 0830 w/o any relief. Denies fall or injury. BP - 177/92, HR - 72, O2 SAT 97% RA.

## 2012-09-11 NOTE — ED Notes (Signed)
Discharge instructions reviewed w/ pt., verbalizes understanding. One prescription provided at discharge. 

## 2012-09-11 NOTE — ED Provider Notes (Addendum)
History     CSN: [id number]  Arrival date & time 09/11/12  1016   First MD Initiated Contact with Patient 09/11/12 1020      Chief Complaint  Patient presents with  . Leg Pain    (Consider location/radiation/quality/duration/timing/severity/associated sxs/prior treatment) The history is provided by the patient.  Logan Bean is a 79 y.o. male should hypertension, high cholesterol, severe arthritis here presenting with left leg pain. He has severe arthritis and his left knee and is scheduled for knee replacement. He has chronic pain in the left knee. Yesterday the pain got worse and now radiated up to the left hip. Denies any recent falls. Took some oxycodone without relief. Denies any increasing leg swelling or calf pain. Denies any shortness of breath.    Past Medical History  Diagnosis Date  . Hypertension   . Hypercholesteremia   . GERD (gastroesophageal reflux disease)   . Stomach ulcer   . H/O hiatal hernia   . Varicose veins     both legs  . Arthritis     left knee  . Left rotator cuff tear Jul 12, 2012  . Diabetes mellitus     diet controlled, pt does not check cbg    Past Surgical History  Procedure Laterality Date  . Esopagus strectching  2003 and 3 yrs ago  . Tonsillectomy  age 41  . Shoulder open rotator cuff repair  11/05/2011    Procedure: ROTATOR CUFF REPAIR SHOULDER OPEN;  Surgeon: Jacki Cones, MD;  Location: [facility];  Service: Orthopedics;  Laterality: Left;  Left Shoulder Rotator Cuff Repair Open with Graft and Anchors    No family history on file.  History  Substance Use Topics  . Smoking status: Former Smoker -- 50 years    Types: Pipe, Cigarettes    Quit date: 07/14/1996  . Smokeless tobacco: Never Used  . Alcohol Use: Yes     Comment: occasional      Review of Systems  Musculoskeletal:       L knee pain   All other systems reviewed and are negative.    Allergies  Morphine and related  Home Medications   Current Outpatient  Rx  Name  Route  Sig  Dispense  Refill  . acetaminophen (TYLENOL) 650 MG CR tablet   Oral   Take 650 mg by mouth daily as needed (joint pain).         Marland Kitchen amLODipine (NORVASC) 5 MG tablet   Oral   Take 5 mg by mouth at bedtime.          Marland Kitchen aspirin EC 81 MG tablet   Oral   Take 81 mg by mouth at bedtime.          Marland Kitchen lisinopril (PRINIVIL,ZESTRIL) 20 MG tablet   Oral   Take 20 mg by mouth at bedtime.          . Multiple Vitamin (MULITIVITAMIN WITH MINERALS) TABS   Oral   Take 1 tablet by mouth daily.         . Omega-3 Fatty Acids (FISH OIL) 1200 MG CAPS   Oral   Take 1,200 mg by mouth daily.         Marland Kitchen OVER THE COUNTER MEDICATION   Oral   Take 2 tablets by mouth daily. Glucosamine + chrondroitin 1500/1200mg          . simvastatin (ZOCOR) 20 MG tablet   Oral   Take 20 mg by mouth at bedtime.  BP 155/61  Pulse 81  Temp(Src) 97.7 F (36.5 C) (Oral)  Resp 18  SpO2 100%  Physical Exam  Nursing note and vitals reviewed. Constitutional: He is oriented to person, place, and time.  Chronically ill, uncomfortable   HENT:  Head: Normocephalic.  Mouth/Throat: Oropharynx is clear and moist.  Eyes: Conjunctivae are normal. Pupils are equal, round, and reactive to light.  Neck: Normal range of motion. Neck supple.  Cardiovascular: Normal rate, regular rhythm and normal heart sounds.   Pulmonary/Chest: Effort normal and breath sounds normal. No respiratory distress. He has no wheezes. He has no rales.  Abdominal: Soft. Bowel sounds are normal. He exhibits no distension. There is no tenderness. There is no rebound.  Musculoskeletal:  L knee dec ROM from pain. Not swollen. L hip dec ROM from pain. 1+ edema bilaterally that is symmetrical, minimal calf tenderness bilaterally. No spinal tenderness. No saddle anesthesia.   Neurological: He is alert and oriented to person, place, and time.  Skin: Skin is warm and dry.  Psychiatric: He has a normal mood and  affect. His behavior is normal. Judgment and thought content normal.    ED Course  Procedures (including critical care time)  Labs Reviewed - No data to display Dg Hip Complete Left  09/11/2012  *RADIOLOGY REPORT*  Clinical Data: Leg pain  LEFT HIP - COMPLETE 2+ VIEW  Comparison: None  Findings: The bones are diffusely osteopenic.  Mild bilateral hip osteoarthritis noted.  There is no evidence of fracture or dislocation.  There is no evidence of arthropathy or other focal bone abnormality.  Soft tissues are unremarkable.  IMPRESSION: 1.  Osteopenia. 2.  No acute findings.   Original Report Authenticated By: Signa Kell, M.D.    Dg Knee Complete 4 Views Left  09/11/2012  *RADIOLOGY REPORT*  Clinical Data: Left knee pain.  No known injury.  LEFT KNEE - COMPLETE 4+ VIEW  Comparison: None.  Findings: Moderate to advanced tricompartment degenerative changes. This is most pronounced in the medial and patellofemoral compartments. No acute bony abnormality.  Specifically, no fracture, subluxation, or dislocation.  Soft tissues are intact. No joint effusion.  IMPRESSION: Moderate to advanced tricompartment degenerative changes.   Original Report Authenticated By: Charlett Nose, M.D.      No diagnosis found.    MDM  Logan Bean is a 79 y.o. male here with L knee and hip pain. Likely from worsening arthritis. Will get xrays and give pain meds and reassess.   11:29 AM Pain improved after percocet. Xray L knee showed advanced arthritis. Xray L hip showed osteopenia. Will give short course of percocet for breakthrough pain. He has orthopedic surgeon to f/u with outpatient for injections and knee replacement eventually. He has help at home and doesn't need placement.        Richardean Canal, MD 09/11/12 1130  Richardean Canal, MD 09/11/12 [phone]

## 2012-09-11 NOTE — Discharge Instructions (Signed)
Take tylenol or percocet for pain.   You may need help with walking.   Follow up with your orthopedic surgeon for injections or knee replacement.   Return to ER if you have severe pain, unable to walk, falling.

## 2012-09-13 ENCOUNTER — Encounter (HOSPITAL_COMMUNITY): Admission: RE | Admit: 2012-09-13 | Payer: Medicare Other | Source: Ambulatory Visit

## 2012-09-14 DIAGNOSIS — S43429A Sprain of unspecified rotator cuff capsule, initial encounter: Secondary | ICD-10-CM | POA: Diagnosis not present

## 2012-09-14 DIAGNOSIS — S46819A Strain of other muscles, fascia and tendons at shoulder and upper arm level, unspecified arm, initial encounter: Secondary | ICD-10-CM | POA: Diagnosis not present

## 2012-09-15 ENCOUNTER — Encounter (HOSPITAL_COMMUNITY): Payer: Self-pay

## 2012-09-15 ENCOUNTER — Encounter (HOSPITAL_COMMUNITY)
Admission: RE | Admit: 2012-09-15 | Discharge: 2012-09-15 | Disposition: A | Discharge status: Home / Self Care | Payer: Medicare Other | Source: Ambulatory Visit | Attending: Orthopedic Surgery | Admitting: Orthopedic Surgery

## 2012-09-15 DIAGNOSIS — I10 Essential (primary) hypertension: Secondary | ICD-10-CM | POA: Diagnosis not present

## 2012-09-15 DIAGNOSIS — Z79899 Other long term (current) drug therapy: Secondary | ICD-10-CM | POA: Diagnosis not present

## 2012-09-15 DIAGNOSIS — R911 Solitary pulmonary nodule: Secondary | ICD-10-CM | POA: Diagnosis not present

## 2012-09-15 DIAGNOSIS — E7800 Pure hypercholesterolemia, unspecified: Secondary | ICD-10-CM | POA: Diagnosis not present

## 2012-09-15 DIAGNOSIS — M67919 Unspecified disorder of synovium and tendon, unspecified shoulder: Secondary | ICD-10-CM | POA: Diagnosis not present

## 2012-09-15 DIAGNOSIS — Z87891 Personal history of nicotine dependence: Secondary | ICD-10-CM | POA: Diagnosis not present

## 2012-09-15 DIAGNOSIS — G8918 Other acute postprocedural pain: Secondary | ICD-10-CM | POA: Diagnosis not present

## 2012-09-15 DIAGNOSIS — E119 Type 2 diabetes mellitus without complications: Secondary | ICD-10-CM | POA: Diagnosis not present

## 2012-09-15 DIAGNOSIS — M19019 Primary osteoarthritis, unspecified shoulder: Secondary | ICD-10-CM | POA: Diagnosis not present

## 2012-09-15 DIAGNOSIS — Z01812 Encounter for preprocedural laboratory examination: Secondary | ICD-10-CM | POA: Diagnosis not present

## 2012-09-15 DIAGNOSIS — Z96619 Presence of unspecified artificial shoulder joint: Secondary | ICD-10-CM | POA: Diagnosis not present

## 2012-09-15 DIAGNOSIS — Z471 Aftercare following joint replacement surgery: Secondary | ICD-10-CM | POA: Diagnosis not present

## 2012-09-15 DIAGNOSIS — M719 Bursopathy, unspecified: Secondary | ICD-10-CM | POA: Diagnosis present

## 2012-09-15 DIAGNOSIS — K449 Diaphragmatic hernia without obstruction or gangrene: Secondary | ICD-10-CM | POA: Diagnosis present

## 2012-09-15 DIAGNOSIS — M25519 Pain in unspecified shoulder: Secondary | ICD-10-CM | POA: Diagnosis not present

## 2012-09-15 LAB — CBC
MCH: 28.7 pg (ref 26.0–34.0)
MCHC: 33.8 g/dL (ref 30.0–36.0)
Platelets: 233 10*3/uL (ref 150–400)
RDW: 15 % (ref 11.5–15.5)

## 2012-09-15 LAB — BASIC METABOLIC PANEL
BUN: 33 mg/dL — ABNORMAL HIGH (ref 6–23)
Calcium: 10 mg/dL (ref 8.4–10.5)
GFR calc Af Amer: >90 mL/min (ref >90)
GFR calc non Af Amer: 79 mL/min — ABNORMAL LOW (ref >90)
Glucose, Bld: 122 mg/dL — ABNORMAL HIGH (ref 70–99)
Sodium: 141 mEq/L (ref 135–145)

## 2012-09-15 LAB — SURGICAL PCR SCREEN
MRSA, PCR: NEGATIVE
Staphylococcus aureus: NEGATIVE

## 2012-09-15 LAB — TYPE AND SCREEN
ABO/RH(D): O POS
Antibody Screen: NEGATIVE

## 2012-09-15 NOTE — Pre-Procedure Instructions (Signed)
Logan Bean  09/15/2012   Your procedure is scheduled on:  09-17-2012  Report to [facility] at 11:30 AM.  Call this number if you have problems the morning of surgery: [phone]   Remember:   Do not eat food or drink liquids after midnight.   Take these medicines the morning of surgery with A SIP OF WATER: pain medication as needed   Do not wear jewelry Do not wear lotions, powders, or perfumes. You may wear deodorant.  Do not shave 48 hours prior to surgery. Men may shave face and neck.  Do not bring valuables to the hospital.  Contacts, dentures or bridgework may not be worn into surgery.  Leave suitcase in the car. After surgery it may be brought to your room.   For patients admitted to the hospital, checkout time is 11:00 AM the day of  discharge.   Patients discharged the day of surgery will not be allowed to drive  home.    Special Instructions: Shower using CHG 2 nights before surgery and the night before surgery.  If you shower the day of surgery use CHG.  Use special wash - you have one bottle of CHG for all showers.  You should use approximately 1/3 of the bottle for each shower.   Please read over the following fact sheets that you were given: Pain Booklet, Coughing and Deep Breathing, Blood Transfusion Information and Surgical Site Infection Prevention

## 2012-09-15 NOTE — Progress Notes (Addendum)
EKG,CXR and ov requested from [facility], Dr. Glade Lloyd.

## 2012-09-16 MED ORDER — CEFAZOLIN SODIUM-DEXTROSE 2-3 GM-% IV SOLR
2.0000 g | INTRAVENOUS | Status: AC
  Administered 2012-09-17: 2 g via INTRAVENOUS
  Filled 2012-09-16: qty 50

## 2012-09-16 NOTE — H&P (Signed)
Logan Bean is an 79 y.o. male.    Chief Complaint: right  Shoulder pain  HPI: Pt is a 79 y.o. male complaining of right shoulder pain for multiple years. Pain had continually increased since the beginning. X-rays in the clinic show end-stage arthritic changes of the right shoulder. Pt has tried various conservative treatments which have failed to alleviate their symptoms, including therapy and injections. Various options are discussed with the patient. Risks, benefits and expectations were discussed with the patient. Patient understand the risks, benefits and expectations and wishes to proceed with surgery.   PCP:  No primary Odin Mariani on file.  D/C Plans: Home with HHPT  PMH: Past Medical History  Diagnosis Date  . Hypertension   . Hypercholesteremia   . Stomach ulcer   . H/O hiatal hernia   . Varicose veins     both legs  . Arthritis     left knee  . Diabetes mellitus     diet controlled, pt does not check cbg  . Left rotator cuff tear Jul 12, 2012    PSH: Past Surgical History  Procedure Laterality Date  . Esopagus strectching  2003 and 3 yrs ago  . Tonsillectomy  age 39  . Shoulder open rotator cuff repair  11/05/2011    Procedure: ROTATOR CUFF REPAIR SHOULDER OPEN;  Surgeon: Jacki Cones, MD;  Location: [facility];  Service: Orthopedics;  Laterality: Left;  Left Shoulder Rotator Cuff Repair Open with Graft and Anchors    Social History:  reports that he quit smoking about 16 years ago. His smoking use included Pipe and Cigarettes. He smoked 0.00 packs per day for 50 years. He has never used smokeless tobacco. He reports that  drinks alcohol. He reports that he does not use illicit drugs.  Allergies:  Allergies  Allergen Reactions  . Morphine And Related Nausea And Vomiting    Medications: No current facility-administered medications for this encounter.   Current Outpatient Prescriptions  Medication Sig Dispense Refill  . acetaminophen (TYLENOL) 650 MG CR  tablet Take 650 mg by mouth daily as needed (joint pain).      . Omega-3 Fatty Acids (FISH OIL) 1200 MG CAPS Take 1,200 mg by mouth daily.      Marland Kitchen OVER THE COUNTER MEDICATION Take 2 tablets by mouth daily. Glucosamine + chrondroitin 1500/1200mg       . amLODipine (NORVASC) 5 MG tablet Take 5 mg by mouth at bedtime.       Marland Kitchen aspirin EC 81 MG tablet Take 81 mg by mouth at bedtime.       Marland Kitchen lisinopril (PRINIVIL,ZESTRIL) 20 MG tablet Take 20 mg by mouth at bedtime.       . Multiple Vitamin (MULITIVITAMIN WITH MINERALS) TABS Take 1 tablet by mouth daily.      Marland Kitchen oxyCODONE-acetaminophen (PERCOCET) 5-325 MG per tablet Take 2 tablets by mouth every 4 (four) hours as needed for pain.  10 tablet  0  . simvastatin (ZOCOR) 20 MG tablet Take 20 mg by mouth at bedtime.         Results for orders placed during the hospital encounter of 09/15/12 (from the past 48 hour(s))  BASIC METABOLIC PANEL     Status: Abnormal   Collection Time    09/15/12  8:59 AM      Result Value Range   Sodium 141  135 - 145 mEq/L   Potassium 4.6  3.5 - 5.1 mEq/L   Chloride 103  96 - 112 mEq/L  CO2 29  19 - 32 mEq/L   Glucose, Bld 122 (*) 70 - 99 mg/dL   BUN 33 (*) 6 - 23 mg/dL   Creatinine, Ser 1.61  0.50 - 1.35 mg/dL   Calcium 09.6  8.4 - 04.5 mg/dL   GFR calc non Af Amer 79 (*) >90 mL/min   GFR calc Af Amer >90  >90 mL/min   Comment:            The eGFR has been calculated     using the CKD EPI equation.     This calculation has not been     validated in all clinical     situations.     eGFR's persistently     <90 mL/min signify     possible Chronic Kidney Disease.  CBC     Status: None   Collection Time    09/15/12  8:59 AM      Result Value Range   WBC 9.8  4.0 - 10.5 K/uL   RBC 4.67  4.22 - 5.81 MIL/uL   Hemoglobin 13.4  13.0 - 17.0 g/dL   HCT 40.9  81.1 - 91.4 %   MCV 84.8  78.0 - 100.0 fL   MCH 28.7  26.0 - 34.0 pg   MCHC 33.8  30.0 - 36.0 g/dL   RDW 78.2  95.6 - 21.3 %   Platelets 233  150 - 400 K/uL    SURGICAL PCR SCREEN     Status: None   Collection Time    09/15/12  9:03 AM      Result Value Range   MRSA, PCR NEGATIVE  NEGATIVE   Staphylococcus aureus NEGATIVE  NEGATIVE   Comment:            The Xpert SA Assay (FDA     approved for NASAL specimens     in patients over 48 years of age),     is one component of     a comprehensive surveillance     program.  Test performance has     been validated by [employer] for patients greater     than or equal to 27 year old.     It is not intended     to diagnose infection nor to     guide or monitor treatment.  TYPE AND SCREEN     Status: None   Collection Time    09/15/12  9:11 AM      Result Value Range   ABO/RH(D) O POS     Antibody Screen NEG     Sample Expiration 09/29/2012    ABO/RH     Status: None   Collection Time    09/15/12  9:11 AM      Result Value Range   ABO/RH(D) O POS     No results found.  ROS: Pain with rom of the right upper extremity Mild weakness to right upper extemity  Physical Exam:  Alert and oriented 79 y.o. male in no acute distress Cranial nerves 2-12 intact Cervical spine: full rom with no tenderness, nv intact distally Chest: active breath sounds bilaterally, no wheeze rhonchi or rales Heart: regular rate and rhythm, no murmur Abd: non tender non distended with active bowel sounds Hip is stable with rom  Right shoulder with moderate hunching with rom, moderate weakness with ER and IR as compred to left   Assessment/Plan Assessment: right shoulder rotator cuff arthropathy  Plan: Patient will undergo  a right reverse total shoulder by Dr. Ranell Patrick at [facility]. Risks benefits and expectations were discussed with the patient. Patient understand risks, benefits and expectations and wishes to proceed.

## 2012-09-17 ENCOUNTER — Inpatient Hospital Stay (HOSPITAL_COMMUNITY): Payer: Medicare Other | Admitting: Anesthesiology

## 2012-09-17 ENCOUNTER — Encounter (HOSPITAL_COMMUNITY)
Admission: RE | Disposition: A | Discharge status: Home / Self Care | Payer: Self-pay | Source: Ambulatory Visit | Attending: Orthopedic Surgery

## 2012-09-17 ENCOUNTER — Encounter (HOSPITAL_COMMUNITY): Payer: Self-pay | Admitting: Anesthesiology

## 2012-09-17 ENCOUNTER — Inpatient Hospital Stay (HOSPITAL_COMMUNITY)
Admission: RE | Admit: 2012-09-17 | Discharge: 2012-09-19 | DRG: 484 | Disposition: A | Discharge status: Home / Self Care | Payer: Medicare Other | Source: Ambulatory Visit | Attending: Orthopedic Surgery | Admitting: Orthopedic Surgery

## 2012-09-17 ENCOUNTER — Inpatient Hospital Stay (HOSPITAL_COMMUNITY): Payer: Medicare Other

## 2012-09-17 DIAGNOSIS — E7800 Pure hypercholesterolemia, unspecified: Secondary | ICD-10-CM | POA: Diagnosis not present

## 2012-09-17 DIAGNOSIS — M67919 Unspecified disorder of synovium and tendon, unspecified shoulder: Secondary | ICD-10-CM | POA: Diagnosis present

## 2012-09-17 DIAGNOSIS — M19019 Primary osteoarthritis, unspecified shoulder: Principal | ICD-10-CM | POA: Diagnosis present

## 2012-09-17 DIAGNOSIS — K449 Diaphragmatic hernia without obstruction or gangrene: Secondary | ICD-10-CM | POA: Diagnosis present

## 2012-09-17 DIAGNOSIS — M719 Bursopathy, unspecified: Secondary | ICD-10-CM | POA: Diagnosis present

## 2012-09-17 DIAGNOSIS — Z79899 Other long term (current) drug therapy: Secondary | ICD-10-CM

## 2012-09-17 DIAGNOSIS — I10 Essential (primary) hypertension: Secondary | ICD-10-CM | POA: Diagnosis present

## 2012-09-17 DIAGNOSIS — E119 Type 2 diabetes mellitus without complications: Secondary | ICD-10-CM | POA: Diagnosis present

## 2012-09-17 DIAGNOSIS — Z87891 Personal history of nicotine dependence: Secondary | ICD-10-CM

## 2012-09-17 DIAGNOSIS — Z01812 Encounter for preprocedural laboratory examination: Secondary | ICD-10-CM

## 2012-09-17 HISTORY — PX: REVERSE SHOULDER ARTHROPLASTY: SHX5054

## 2012-09-17 LAB — GLUCOSE, CAPILLARY: Glucose-Capillary: 141 mg/dL — ABNORMAL HIGH (ref 70–99)

## 2012-09-17 SURGERY — ARTHROPLASTY, SHOULDER, TOTAL, REVERSE
Anesthesia: General | Site: Shoulder | Laterality: Right | Wound class: Clean

## 2012-09-17 MED ORDER — MENTHOL 3 MG MT LOZG: 1.0000 | LOZENGE | OROMUCOSAL | Status: DC | PRN

## 2012-09-17 MED ORDER — SIMVASTATIN 20 MG PO TABS
20.0000 mg | ORAL_TABLET | Freq: Every day | ORAL | Status: DC
  Administered 2012-09-17 – 2012-09-18 (×2): 20 mg via ORAL
  Filled 2012-09-17 (×3): qty 1

## 2012-09-17 MED ORDER — ACETAMINOPHEN 650 MG RE SUPP: 650.0000 mg | Freq: Four times a day (QID) | RECTAL | Status: DC | PRN

## 2012-09-17 MED ORDER — ROCURONIUM BROMIDE 100 MG/10ML IV SOLN
INTRAVENOUS | Status: DC | PRN
  Administered 2012-09-17: 30 mg via INTRAVENOUS

## 2012-09-17 MED ORDER — METOCLOPRAMIDE HCL 5 MG/ML IJ SOLN
5.0000 mg | Freq: Three times a day (TID) | INTRAMUSCULAR | Status: DC | PRN
  Filled 2012-09-17: qty 2

## 2012-09-17 MED ORDER — HYDROMORPHONE HCL PF 1 MG/ML IJ SOLN
0.5000 mg | INTRAMUSCULAR | Status: DC | PRN
  Administered 2012-09-18 (×2): 0.5 mg via INTRAVENOUS
  Filled 2012-09-17 (×2): qty 1

## 2012-09-17 MED ORDER — CEFAZOLIN SODIUM 1-5 GM-% IV SOLN
1.0000 g | Freq: Four times a day (QID) | INTRAVENOUS | Status: AC
  Administered 2012-09-17 – 2012-09-18 (×3): 1 g via INTRAVENOUS
  Filled 2012-09-17 (×3): qty 50

## 2012-09-17 MED ORDER — GLYCOPYRROLATE 0.2 MG/ML IJ SOLN
INTRAMUSCULAR | Status: DC | PRN
  Administered 2012-09-17: 0.4 mg via INTRAVENOUS

## 2012-09-17 MED ORDER — BUPIVACAINE-EPINEPHRINE 0.25% -1:200000 IJ SOLN
INTRAMUSCULAR | Status: AC
  Filled 2012-09-17: qty 1

## 2012-09-17 MED ORDER — ASPIRIN EC 81 MG PO TBEC
81.0000 mg | DELAYED_RELEASE_TABLET | Freq: Every day | ORAL | Status: DC
  Administered 2012-09-17 – 2012-09-18 (×2): 81 mg via ORAL
  Filled 2012-09-17 (×3): qty 1

## 2012-09-17 MED ORDER — ONDANSETRON HCL 4 MG/2ML IJ SOLN
4.0000 mg | Freq: Once | INTRAMUSCULAR | Status: AC | PRN
  Administered 2012-09-17: 4 mg via INTRAVENOUS

## 2012-09-17 MED ORDER — POTASSIUM CHLORIDE IN NACL 20-0.9 MEQ/L-% IV SOLN
INTRAVENOUS | Status: DC
  Administered 2012-09-17 – 2012-09-18 (×2): via INTRAVENOUS
  Filled 2012-09-17 (×4): qty 1000

## 2012-09-17 MED ORDER — AMLODIPINE BESYLATE 5 MG PO TABS
5.0000 mg | ORAL_TABLET | Freq: Every day | ORAL | Status: DC
  Administered 2012-09-18: 5 mg via ORAL
  Filled 2012-09-17 (×3): qty 1

## 2012-09-17 MED ORDER — CHLORHEXIDINE GLUCONATE 4 % EX LIQD: 60.0000 mL | Freq: Once | CUTANEOUS | Status: DC

## 2012-09-17 MED ORDER — LIDOCAINE HCL (CARDIAC) 20 MG/ML IV SOLN
INTRAVENOUS | Status: DC | PRN
  Administered 2012-09-17: 60 mg via INTRAVENOUS

## 2012-09-17 MED ORDER — FENTANYL CITRATE 0.05 MG/ML IJ SOLN: 50.0000 ug | INTRAMUSCULAR | Status: DC | PRN

## 2012-09-17 MED ORDER — PROPOFOL 10 MG/ML IV BOLUS
INTRAVENOUS | Status: DC | PRN
  Administered 2012-09-17: 90 mg via INTRAVENOUS

## 2012-09-17 MED ORDER — NEOSTIGMINE METHYLSULFATE 1 MG/ML IJ SOLN
INTRAMUSCULAR | Status: DC | PRN
  Administered 2012-09-17: 3 mg via INTRAVENOUS

## 2012-09-17 MED ORDER — FENTANYL CITRATE 0.05 MG/ML IJ SOLN
INTRAMUSCULAR | Status: AC
  Administered 2012-09-17: 100 ug via INTRAVENOUS
  Filled 2012-09-17: qty 2

## 2012-09-17 MED ORDER — PHENOL 1.4 % MT LIQD
1.0000 | OROMUCOSAL | Status: DC | PRN
  Filled 2012-09-17: qty 177

## 2012-09-17 MED ORDER — ONDANSETRON HCL 4 MG/2ML IJ SOLN
INTRAMUSCULAR | Status: DC | PRN
  Administered 2012-09-17: 4 mg via INTRAVENOUS

## 2012-09-17 MED ORDER — METHOCARBAMOL 500 MG PO TABS: 500.0000 mg | ORAL_TABLET | Freq: Three times a day (TID) | ORAL | Status: DC | PRN

## 2012-09-17 MED ORDER — HYDROCODONE-ACETAMINOPHEN 5-325 MG PO TABS
1.0000 | ORAL_TABLET | ORAL | Status: DC | PRN
  Administered 2012-09-18: 2 via ORAL
  Administered 2012-09-18: 1 via ORAL
  Administered 2012-09-18 – 2012-09-19 (×3): 2 via ORAL
  Filled 2012-09-17 (×3): qty 2
  Filled 2012-09-17: qty 1
  Filled 2012-09-17: qty 2

## 2012-09-17 MED ORDER — ONDANSETRON HCL 4 MG/2ML IJ SOLN
INTRAMUSCULAR | Status: AC
  Administered 2012-09-17: 4 mg
  Filled 2012-09-17: qty 2

## 2012-09-17 MED ORDER — OMEGA-3-ACID ETHYL ESTERS 1 G PO CAPS
1.0000 g | ORAL_CAPSULE | Freq: Two times a day (BID) | ORAL | Status: DC
  Administered 2012-09-17 – 2012-09-19 (×4): 1 g via ORAL
  Filled 2012-09-17 (×5): qty 1

## 2012-09-17 MED ORDER — HYDROMORPHONE HCL PF 1 MG/ML IJ SOLN
0.2500 mg | INTRAMUSCULAR | Status: DC | PRN
  Administered 2012-09-17 (×3): 0.25 mg via INTRAVENOUS

## 2012-09-17 MED ORDER — BUPIVACAINE-EPINEPHRINE PF 0.5-1:200000 % IJ SOLN
INTRAMUSCULAR | Status: DC | PRN
  Administered 2012-09-17: 16 mL

## 2012-09-17 MED ORDER — METHOCARBAMOL 100 MG/ML IJ SOLN
500.0000 mg | Freq: Four times a day (QID) | INTRAVENOUS | Status: DC | PRN
  Filled 2012-09-17: qty 5

## 2012-09-17 MED ORDER — METOCLOPRAMIDE HCL 10 MG PO TABS: 5.0000 mg | ORAL_TABLET | Freq: Three times a day (TID) | ORAL | Status: DC | PRN

## 2012-09-17 MED ORDER — SODIUM CHLORIDE 0.9 % IV SOLN
10.0000 mg | INTRAVENOUS | Status: DC | PRN
  Administered 2012-09-17: 10 ug/min via INTRAVENOUS

## 2012-09-17 MED ORDER — SODIUM CHLORIDE 0.9 % IR SOLN
Status: DC | PRN
  Administered 2012-09-17: 1000 mL

## 2012-09-17 MED ORDER — LISINOPRIL 20 MG PO TABS
20.0000 mg | ORAL_TABLET | Freq: Every day | ORAL | Status: DC
  Administered 2012-09-18: 20 mg via ORAL
  Filled 2012-09-17 (×3): qty 1

## 2012-09-17 MED ORDER — BUPIVACAINE-EPINEPHRINE 0.25% -1:200000 IJ SOLN
INTRAMUSCULAR | Status: DC | PRN
  Administered 2012-09-17: 10 mL

## 2012-09-17 MED ORDER — LACTATED RINGERS IV SOLN
INTRAVENOUS | Status: DC
  Administered 2012-09-17: 13:00:00 via INTRAVENOUS

## 2012-09-17 MED ORDER — ACETAMINOPHEN 325 MG PO TABS: 650.0000 mg | ORAL_TABLET | Freq: Four times a day (QID) | ORAL | Status: DC | PRN

## 2012-09-17 MED ORDER — HYDROMORPHONE HCL PF 1 MG/ML IJ SOLN
INTRAMUSCULAR | Status: AC
  Filled 2012-09-17: qty 1

## 2012-09-17 MED ORDER — ONDANSETRON HCL 4 MG PO TABS: 4.0000 mg | ORAL_TABLET | Freq: Four times a day (QID) | ORAL | Status: DC | PRN

## 2012-09-17 MED ORDER — METHOCARBAMOL 500 MG PO TABS
500.0000 mg | ORAL_TABLET | Freq: Four times a day (QID) | ORAL | Status: DC | PRN
  Administered 2012-09-18: 500 mg via ORAL
  Filled 2012-09-17: qty 1

## 2012-09-17 MED ORDER — ADULT MULTIVITAMIN W/MINERALS CH
1.0000 | ORAL_TABLET | Freq: Every day | ORAL | Status: DC
  Administered 2012-09-17 – 2012-09-19 (×3): 1 via ORAL
  Filled 2012-09-17 (×3): qty 1

## 2012-09-17 MED ORDER — HYDROCODONE-ACETAMINOPHEN 5-325 MG PO TABS: 1.0000 | ORAL_TABLET | Freq: Four times a day (QID) | ORAL | Status: DC | PRN

## 2012-09-17 MED ORDER — FENTANYL CITRATE 0.05 MG/ML IJ SOLN
INTRAMUSCULAR | Status: DC | PRN
  Administered 2012-09-17: 50 ug via INTRAVENOUS

## 2012-09-17 MED ORDER — ONDANSETRON HCL 4 MG/2ML IJ SOLN
4.0000 mg | Freq: Four times a day (QID) | INTRAMUSCULAR | Status: DC | PRN
  Filled 2012-09-17: qty 2

## 2012-09-17 SURGICAL SUPPLY — 55 items
BLADE SAG 18X100X1.27 (BLADE) ×2 IMPLANT
CLOTH BEACON ORANGE TIMEOUT ST (SAFETY) ×2 IMPLANT
COVER SURGICAL LIGHT HANDLE (MISCELLANEOUS) ×2 IMPLANT
DRAPE INCISE IOBAN 66X45 STRL (DRAPES) ×4 IMPLANT
DRAPE U-SHAPE 47X51 STRL (DRAPES) ×2 IMPLANT
DRAPE X-RAY CASS 24X20 (DRAPES) IMPLANT
DRILL BIT 5/64 (BIT) ×4 IMPLANT
DRSG ADAPTIC 3X8 NADH LF (GAUZE/BANDAGES/DRESSINGS) ×2 IMPLANT
DRSG PAD ABDOMINAL 8X10 ST (GAUZE/BANDAGES/DRESSINGS) ×2 IMPLANT
DURAPREP 26ML APPLICATOR (WOUND CARE) ×2 IMPLANT
ELECT BLADE 4.0 EZ CLEAN MEGAD (MISCELLANEOUS) ×2
ELECT NEEDLE TIP 2.8 STRL (NEEDLE) ×2 IMPLANT
ELECT REM PT RETURN 9FT ADLT (ELECTROSURGICAL) ×2
ELECTRODE BLDE 4.0 EZ CLN MEGD (MISCELLANEOUS) ×1 IMPLANT
ELECTRODE REM PT RTRN 9FT ADLT (ELECTROSURGICAL) ×1 IMPLANT
GLOVE BIOGEL PI ORTHO PRO 7.5 (GLOVE) ×1
GLOVE BIOGEL PI ORTHO PRO SZ8 (GLOVE) ×1
GLOVE ORTHO TXT STRL SZ7.5 (GLOVE) ×2 IMPLANT
GLOVE PI ORTHO PRO STRL 7.5 (GLOVE) ×1 IMPLANT
GLOVE PI ORTHO PRO STRL SZ8 (GLOVE) ×1 IMPLANT
GLOVE SURG ORTHO 8.5 STRL (GLOVE) ×2 IMPLANT
GOWN STRL NON-REIN LRG LVL3 (GOWN DISPOSABLE) ×2 IMPLANT
GOWN STRL REIN XL XLG (GOWN DISPOSABLE) ×4 IMPLANT
HANDPIECE INTERPULSE COAX TIP (DISPOSABLE)
KIT BASIN OR (CUSTOM PROCEDURE TRAY) ×2 IMPLANT
KIT ROOM TURNOVER OR (KITS) ×2 IMPLANT
MANIFOLD NEPTUNE II (INSTRUMENTS) ×2 IMPLANT
NEEDLE 1/2 CIR MAYO (NEEDLE) ×2 IMPLANT
NEEDLE HYPO 25GX1X1/2 BEV (NEEDLE) ×2 IMPLANT
NS IRRIG 1000ML POUR BTL (IV SOLUTION) ×2 IMPLANT
PACK SHOULDER (CUSTOM PROCEDURE TRAY) ×2 IMPLANT
PAD ARMBOARD 7.5X6 YLW CONV (MISCELLANEOUS) ×4 IMPLANT
SET HNDPC FAN SPRY TIP SCT (DISPOSABLE) IMPLANT
SLING ARM FOAM STRAP LRG (SOFTGOODS) IMPLANT
SLING ARM FOAM STRAP MED (SOFTGOODS) IMPLANT
SPONGE GAUZE 4X4 12PLY (GAUZE/BANDAGES/DRESSINGS) ×2 IMPLANT
SPONGE LAP 18X18 X RAY DECT (DISPOSABLE) ×2 IMPLANT
SPONGE LAP 4X18 X RAY DECT (DISPOSABLE) ×2 IMPLANT
STRIP CLOSURE SKIN 1/2X4 (GAUZE/BANDAGES/DRESSINGS) ×2 IMPLANT
SUCTION FRAZIER TIP 10 FR DISP (SUCTIONS) ×2 IMPLANT
SUT FIBERWIRE #2 38 T-5 BLUE (SUTURE) ×8
SUT MNCRL AB 4-0 PS2 18 (SUTURE) ×2 IMPLANT
SUT VIC AB 2-0 CT1 27 (SUTURE) ×1
SUT VIC AB 2-0 CT1 TAPERPNT 27 (SUTURE) ×1 IMPLANT
SUT VICRYL 0 CT 1 36IN (SUTURE) ×2 IMPLANT
SUTURE FIBERWR #2 38 T-5 BLUE (SUTURE) ×4 IMPLANT
SWABSTICK BENZOIN STERILE (MISCELLANEOUS) IMPLANT
SYR CONTROL 10ML LL (SYRINGE) ×2 IMPLANT
TAPE CLOTH SURG 4X10 WHT LF (GAUZE/BANDAGES/DRESSINGS) ×2 IMPLANT
TOWEL OR 17X24 6PK STRL BLUE (TOWEL DISPOSABLE) ×2 IMPLANT
TOWEL OR 17X26 10 PK STRL BLUE (TOWEL DISPOSABLE) ×2 IMPLANT
TOWER CARTRIDGE SMART MIX (DISPOSABLE) IMPLANT
TRAY FOLEY CATH 14FR (SET/KITS/TRAYS/PACK) IMPLANT
WATER STERILE IRR 1000ML POUR (IV SOLUTION) ×2 IMPLANT
YANKAUER SUCT BULB TIP NO VENT (SUCTIONS) IMPLANT

## 2012-09-17 NOTE — Transfer of Care (Signed)
Immediate Anesthesia Transfer of Care Note  Patient: Logan Bean  Procedure(s) Performed: Procedure(s): REVERSE SHOULDER ARTHROPLASTY (Right)  Patient Location: PACU  Anesthesia Type:GA combined with regional for post-op pain  Level of Consciousness: awake, alert , oriented and patient cooperative  Airway & Oxygen Therapy: Patient Spontanous Breathing and Patient connected to nasal cannula oxygen  Post-op Assessment: Report given to PACU RN, Post -op Vital signs reviewed and stable and Patient moving all extremities  Post vital signs: Reviewed and stable  Complications: No apparent anesthesia complications

## 2012-09-17 NOTE — Anesthesia Procedure Notes (Addendum)
Anesthesia Regional Block:  Interscalene brachial plexus block  Pre-Anesthetic Checklist: ,, timeout performed, Correct Patient, Correct Site, Correct Laterality, Correct Procedure, Correct Position, site marked, Risks and benefits discussed,  Surgical consent,  Pre-op evaluation,  At surgeon's request and post-op pain management  Laterality: Right  Prep: Maximum Sterile Barrier Precautions used, chloraprep and alcohol swabs       Needles:  Injection technique: Single-shot  Needle Type: Stimulator Needle - 40          Additional Needles:  Procedures: nerve stimulator Interscalene brachial plexus block  Nerve Stimulator or Paresthesia:  Response: 0.5 mA, 0.1 ms, 4 cm  Additional Responses:   Narrative:  Start time: 09/17/2012 1:25 PM End time: 09/17/2012 1:30 PM Injection made incrementally with aspirations every 5 mL.  Performed by: Personally  Anesthesiologist: Maren Beach MD  Additional Notes: Pt understands and accepts procedure and risks. 16cc 0.5% Marcaine w/ epi and w/o difficulty w/ mild discomfort. GES   Procedure Name: Intubation Date/Time: 09/17/2012 1:38 PM Performed by: Sharlene Dory E Pre-anesthesia Checklist: Patient identified, Emergency Drugs available, Suction available, Patient being monitored and Timeout performed Patient Re-evaluated:Patient Re-evaluated prior to inductionOxygen Delivery Method: Circle system utilized Preoxygenation: Pre-oxygenation with 100% oxygen Intubation Type: IV induction Ventilation: Mask ventilation without difficulty Laryngoscope Size: Mac and 4 Grade View: Grade I Tube type: Oral Tube size: 7.0 mm Number of attempts: 1 Airway Equipment and Method: Stylet and LTA kit utilized Placement Confirmation: ETT inserted through vocal cords under direct vision,  positive ETCO2 and breath sounds checked- equal and bilateral Secured at: 21 cm Tube secured with: Tape Dental Injury: Teeth and Oropharynx as per pre-operative  assessment

## 2012-09-17 NOTE — Preoperative (Signed)
Beta Blockers   Reason not to administer Beta Blockers:Not Applicable 

## 2012-09-17 NOTE — Brief Op Note (Signed)
09/17/2012  3:39 PM  PATIENT:  Logan Bean  79 y.o. male  PRE-OPERATIVE DIAGNOSIS:  RIGHT SHOULDER OA, end stage, rotator cuff insufficiency  POST-OPERATIVE DIAGNOSIS:  RIGHT SHOULDER OA, end stage, rotator cuff insufficiency  PROCEDURE:  Procedure(s): REVERSE SHOULDER ARTHROPLASTY (Right) , DePuy Delta Xtend  SURGEON:  Surgeon(s) and Role:    * Verlee Rossetti, MD - Primary  PHYSICIAN ASSISTANT:   ASSISTANTS: Thea Gist, PA-C   ANESTHESIA:   regional and general  EBL:     BLOOD ADMINISTERED:none  DRAINS: none and Urinary Catheter (Suprapubic)   LOCAL MEDICATIONS USED:  MARCAINE     SPECIMEN:  No Specimen  DISPOSITION OF SPECIMEN:  N/A  COUNTS:  YES  TOURNIQUET:  * No tourniquets in log *  DICTATION: .Other Dictation: Dictation Number [id number]  PLAN OF CARE: Admit to inpatient   PATIENT DISPOSITION:  PACU - hemodynamically stable.   Delay start of Pharmacological VTE agent (>24hrs) due to surgical blood loss or risk of bleeding: not applicable

## 2012-09-17 NOTE — Progress Notes (Signed)
Paged Dr. Lequita Halt about Logan Bean BP=133/50 HR=86/. No new orders where giving at the present time. RN will cont to check pts vitals, but at the present time the pt is stable A&Ox4.

## 2012-09-17 NOTE — Interval H&P Note (Signed)
History and Physical Interval Note:  09/17/2012 12:58 PM  Suhas Brummitt  has presented today for surgery, with the diagnosis of RIGHT SHOULDER OA  The various methods of treatment have been discussed with the patient and family. After consideration of risks, benefits and other options for treatment, the patient has consented to  Procedure(s): REVERSE SHOULDER ARTHROPLASTY (Right) as a surgical intervention .  The patient's history has been reviewed, patient examined, no change in status, stable for surgery.  I have reviewed the patient's chart and labs.  Questions were answered to the patient's satisfaction.     NORRIS,STEVEN R

## 2012-09-17 NOTE — Anesthesia Preprocedure Evaluation (Addendum)
Anesthesia Evaluation  Patient identified by MRN, date of birth, ID band Patient awake    Reviewed: Allergy & Precautions, H&P , NPO status , Patient's Chart, lab work & pertinent test results  Airway Mallampati: I      Dental  (+) Teeth Intact   Pulmonary          Cardiovascular hypertension, Pt. on medications     Neuro/Psych    GI/Hepatic hiatal hernia, PUD,   Endo/Other  diabetes, Well Controlled, Type 2  Renal/GU      Musculoskeletal   Abdominal   Peds  Hematology   Anesthesia Other Findings   Reproductive/Obstetrics                          Anesthesia Physical Anesthesia Plan  ASA: III  Anesthesia Plan: General   Post-op Pain Management:    Induction: Intravenous  Airway Management Planned: Oral ETT  Additional Equipment:   Intra-op Plan:   Post-operative Plan: Extubation in OR  Informed Consent: I have reviewed the patients History and Physical, chart, labs and discussed the procedure including the risks, benefits and alternatives for the proposed anesthesia with the patient or authorized representative who has indicated his/her understanding and acceptance.   Dental advisory given  Plan Discussed with: CRNA and Anesthesiologist  Anesthesia Plan Comments:         Anesthesia Quick Evaluation

## 2012-09-17 NOTE — Anesthesia Postprocedure Evaluation (Signed)
  Anesthesia Post-op Note  Patient: Logan Bean  Procedure(s) Performed: Procedure(s): REVERSE SHOULDER ARTHROPLASTY (Right)  Patient Location: PACU  Anesthesia Type:GA combined with regional for post-op pain  Level of Consciousness: awake  Airway and Oxygen Therapy: Patient Spontanous Breathing and Patient connected to nasal cannula oxygen  Post-op Pain: mild  Post-op Assessment: Post-op Vital signs reviewed, Patient's Cardiovascular Status Stable, Respiratory Function Stable, Patent Airway and No signs of Nausea or vomiting  Post-op Vital Signs: Reviewed and stable  Complications: No apparent anesthesia complications

## 2012-09-18 LAB — BASIC METABOLIC PANEL
BUN: 20 mg/dL (ref 6–23)
Chloride: 102 mEq/L (ref 96–112)
Glucose, Bld: 167 mg/dL — ABNORMAL HIGH (ref 70–99)
Potassium: 4.3 mEq/L (ref 3.5–5.1)
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
## 2012-09-18 NOTE — Op Note (Signed)
NAMEJIOVANI, MCCAMMON NO.:  [id number]  MEDICAL RECORD NO.:  [id number]  LOCATION:  6N09C                        FACILITY:  [facility]  PHYSICIAN:  Almedia Balls. Ranell Patrick, M.D. DATE OF BIRTH:  May 02, 1921  DATE OF PROCEDURE:  09/17/2012 DATE OF DISCHARGE:                              OPERATIVE REPORT   PREOPERATIVE DIAGNOSIS:  Right shoulder end-stage osteoarthritis with rotator cuff insufficiency.  POSTOPERATIVE DIAGNOSIS:  Right shoulder end-stage osteoarthritis with rotator cuff insufficiency.  PROCEDURE PERFORMED:  Right shoulder reverse total shoulder arthroplasty.  ATTENDING SURGEON:  Almedia Balls. Ranell Patrick, MD.  ASSISTANT:  Donnie Coffin. Dixon, PA-C, who scrubbed the entire procedure and necessary for satisfactory completion of surgery.  ANESTHESIA:  General anesthesia was used plus interscalene block.  ESTIMATED BLOOD LOSS:  Under 100 mL.  FLUID REPLACEMENT:  1000 mL crystalloid.  INSTRUMENT COUNTS:  Correct.  COMPLICATIONS:  There were no complications.  ANTIBIOTICS:  Perioperative antibiotics were given.  INDICATIONS:  The patient is a 79 year old male with a history of worsening right shoulder arthritis and rotator cuff insufficiency, creating a very debilitating shoulder condition with basically very little to no shoulder function and large amount of pain.  The patient has failed all measures of conservative management including injections, modification activity, and anti-inflammatories presents for operative treatment.  Plan is reversed total shoulder arthroplasty.  Risks and benefits were discussed.  The patient would like to proceed.  DESCRIPTION OF PROCEDURE:  After an adequate level of anesthesia was achieved, the patient was positioned in modified beach-chair position. Right shoulder was correctly identified.  Time-out called.  Sterile prep and drape of the shoulder performed.  We then entered the shoulder using standard deltopectoral incision,  started the coracoid process extending down to the anterior humerus.  Dissection down through the subcu tissue. Cephalic vein was identified and taken out laterally with the deltoid. The pectoralis was taken medially.  Conjoined tendon was retracted medially, placed the deep retractor and then released the subscapularis subperiosteally off the lesser tuberosity and tagged for later repair. We then externally rotated, and released the inferior capsule, gaining good exposure.  We delivered the head out of the wound and then introduced the 6-mm reamer through the humeral head.  We then reamed up to size 12 and then used our 12 resection guide to resect the head set on 10 degrees of retroversion for this delta extend prosthesis from [employer].  Once this was completed, we prepared the metaphyseal portion of the humerus with a reaming for size 1 epi right.  Once that was done, we placed a trial of 12 stem with the epi 1 right modular metaphyseal portion and impacted that in position.  Again set on 10 degrees retroversion with the humeral stem in place.  We retracted the humerus posteriorly.  We then did a 360 degree removal of the glenoid labrum and the capsule exposing the glenoid, which showed quite a bit of posterior wear.  We found our central point and placed a central guide pin.  We then reamed for the metaglene, then drilled the central hole out.  We impacted the metaglene in position and seated well.  We then placed 3 locked screws, inferior 42;  30 up under the coracoid, and then a 24 anteriorly and then we did a 18 nonlocked posteriorly for good purchase and secured the metaglene, we then placed our 38 standard glenoid sphere in place, we reduced the shoulder with a +338 poly.  We were happy with that.  We removed the trial components, placed sutures through bone for subscap reattachment.  Thoroughly irrigated the canal, we then used impaction grafting technique with available bone graft  to impact the hydroxyapatite coated 12 stem with a size 1 epi right metaphyseal portion into position and it was nice and secure.  We then went ahead and placed a +6, we trialed again with +3 and felt just touch loose and placed a 38+6 poly in place.  Impacted that and reduced the shoulder, were happy with tension.  We actually repaired the subscapularis anatomically with the sutures through bone and also the subscap repair to the rotator cuff tear, did not really close down the rotator interval.  I did not wanted to tighten the shoulder too much, but we did want to prevent any peeling off of the remaining rotator cuff.  It felt like we had enough balance to it.  It was little stiff and it probably will limit his internal rotation little bit with the shoulder at least initially.  Hopefully that will stretch out, but should provide some added increased strength and also increased function and potentially improve stability of the implant as well.  Once we had done all that we were happy with our stability, happy with the soft tissue repair.  We thoroughly irrigated the subdeltoid interval and deltopectoral interval and then closed deltopectoral interval with 0 Vicryl suture followed by 2-0 Vicryl with subcutaneous closure and 4-0 Monocryl for skin.  Steri- Strips applied followed by sterile dressing.  The patient tolerated the procedure well.     Almedia Balls. Ranell Patrick, M.D.     SRN/MEDQ  D:  09/17/2012  T:  09/18/2012  Job:  [id number]

## 2012-09-18 NOTE — Progress Notes (Signed)
Patient ID: Logan Bean, male   DOB: [birth year], 79 y.o.   MRN: [id number] Subjective: 1 Day Post-Op Procedure(s) (LRB): REVERSE SHOULDER ARTHROPLASTY (Right)    Patient reports pain as mild.  Worried about going home without power reasonable given age and recent procedure  Objective:   VITALS:   Filed Vitals:   09/18/12 0621  BP: 148/46  Pulse: 92  Temp: 99.4 F (37.4 C)  Resp: 14    Neurovascular intact Incision: dressing C/D/I, big dressing removed, incision and steris dry  LABS  Recent Labs  09/18/12 0555  HGB 12.4*  HCT 37.2*     Recent Labs  09/18/12 0555  NA 139  K 4.3  BUN 20  CREATININE 0.77  GLUCOSE 167*    No results found for this basename: LABPT, INR,  in the last 72 hours   Assessment/Plan: 1 Day Post-Op Procedure(s) (LRB): REVERSE SHOULDER ARTHROPLASTY (Right)   Discharge home with home health if power resumes in home or tomorrow D/c material on chart

## 2012-09-19 ENCOUNTER — Emergency Department (HOSPITAL_COMMUNITY): Payer: Medicare Other

## 2012-09-19 ENCOUNTER — Inpatient Hospital Stay (HOSPITAL_COMMUNITY)
Admission: EM | Admit: 2012-09-19 | Discharge: 2012-09-29 | DRG: 493 | Disposition: A | Discharge status: Skilled Nursing Facility | Payer: Medicare Other | Attending: Internal Medicine | Admitting: Internal Medicine

## 2012-09-19 ENCOUNTER — Encounter (HOSPITAL_COMMUNITY): Payer: Self-pay | Admitting: Emergency Medicine

## 2012-09-19 DIAGNOSIS — I10 Essential (primary) hypertension: Secondary | ICD-10-CM | POA: Diagnosis present

## 2012-09-19 DIAGNOSIS — A0472 Enterocolitis due to Clostridium difficile, not specified as recurrent: Secondary | ICD-10-CM | POA: Diagnosis not present

## 2012-09-19 DIAGNOSIS — M75122 Complete rotator cuff tear or rupture of left shoulder, not specified as traumatic: Secondary | ICD-10-CM | POA: Diagnosis present

## 2012-09-19 DIAGNOSIS — R296 Repeated falls: Secondary | ICD-10-CM | POA: Diagnosis not present

## 2012-09-19 DIAGNOSIS — Z792 Long term (current) use of antibiotics: Secondary | ICD-10-CM | POA: Diagnosis not present

## 2012-09-19 DIAGNOSIS — M25579 Pain in unspecified ankle and joints of unspecified foot: Secondary | ICD-10-CM | POA: Diagnosis not present

## 2012-09-19 DIAGNOSIS — S99929A Unspecified injury of unspecified foot, initial encounter: Secondary | ICD-10-CM | POA: Diagnosis not present

## 2012-09-19 DIAGNOSIS — R197 Diarrhea, unspecified: Secondary | ICD-10-CM | POA: Diagnosis not present

## 2012-09-19 DIAGNOSIS — S82843A Displaced bimalleolar fracture of unspecified lower leg, initial encounter for closed fracture: Secondary | ICD-10-CM | POA: Diagnosis not present

## 2012-09-19 DIAGNOSIS — S8290XD Unspecified fracture of unspecified lower leg, subsequent encounter for closed fracture with routine healing: Secondary | ICD-10-CM | POA: Diagnosis not present

## 2012-09-19 DIAGNOSIS — M6281 Muscle weakness (generalized): Secondary | ICD-10-CM | POA: Diagnosis not present

## 2012-09-19 DIAGNOSIS — IMO0001 Reserved for inherently not codable concepts without codable children: Secondary | ICD-10-CM | POA: Diagnosis not present

## 2012-09-19 DIAGNOSIS — S82841A Displaced bimalleolar fracture of right lower leg, initial encounter for closed fracture: Secondary | ICD-10-CM

## 2012-09-19 DIAGNOSIS — S8292XD Unspecified fracture of left lower leg, subsequent encounter for closed fracture with routine healing: Secondary | ICD-10-CM

## 2012-09-19 DIAGNOSIS — D259 Leiomyoma of uterus, unspecified: Secondary | ICD-10-CM | POA: Diagnosis not present

## 2012-09-19 DIAGNOSIS — R109 Unspecified abdominal pain: Secondary | ICD-10-CM | POA: Diagnosis not present

## 2012-09-19 DIAGNOSIS — Y92009 Unspecified place in unspecified non-institutional (private) residence as the place of occurrence of the external cause: Secondary | ICD-10-CM | POA: Diagnosis not present

## 2012-09-19 DIAGNOSIS — Z87891 Personal history of nicotine dependence: Secondary | ICD-10-CM | POA: Diagnosis not present

## 2012-09-19 DIAGNOSIS — W010XXA Fall on same level from slipping, tripping and stumbling without subsequent striking against object, initial encounter: Secondary | ICD-10-CM | POA: Diagnosis present

## 2012-09-19 DIAGNOSIS — Z96619 Presence of unspecified artificial shoulder joint: Secondary | ICD-10-CM

## 2012-09-19 DIAGNOSIS — IMO0002 Reserved for concepts with insufficient information to code with codable children: Secondary | ICD-10-CM | POA: Diagnosis not present

## 2012-09-19 DIAGNOSIS — R279 Unspecified lack of coordination: Secondary | ICD-10-CM | POA: Diagnosis not present

## 2012-09-19 DIAGNOSIS — Z471 Aftercare following joint replacement surgery: Secondary | ICD-10-CM | POA: Diagnosis not present

## 2012-09-19 DIAGNOSIS — E119 Type 2 diabetes mellitus without complications: Secondary | ICD-10-CM | POA: Diagnosis not present

## 2012-09-19 DIAGNOSIS — D3500 Benign neoplasm of unspecified adrenal gland: Secondary | ICD-10-CM | POA: Diagnosis not present

## 2012-09-19 DIAGNOSIS — R609 Edema, unspecified: Secondary | ICD-10-CM | POA: Diagnosis not present

## 2012-09-19 DIAGNOSIS — Z5189 Encounter for other specified aftercare: Secondary | ICD-10-CM | POA: Diagnosis not present

## 2012-09-19 DIAGNOSIS — S91009A Unspecified open wound, unspecified ankle, initial encounter: Secondary | ICD-10-CM | POA: Diagnosis not present

## 2012-09-19 DIAGNOSIS — M79609 Pain in unspecified limb: Secondary | ICD-10-CM | POA: Diagnosis not present

## 2012-09-19 DIAGNOSIS — S82842D Displaced bimalleolar fracture of left lower leg, subsequent encounter for closed fracture with routine healing: Secondary | ICD-10-CM

## 2012-09-19 DIAGNOSIS — M7989 Other specified soft tissue disorders: Secondary | ICD-10-CM | POA: Diagnosis not present

## 2012-09-19 DIAGNOSIS — K828 Other specified diseases of gallbladder: Secondary | ICD-10-CM | POA: Diagnosis not present

## 2012-09-19 DIAGNOSIS — Z794 Long term (current) use of insulin: Secondary | ICD-10-CM | POA: Diagnosis not present

## 2012-09-19 DIAGNOSIS — E7800 Pure hypercholesterolemia, unspecified: Secondary | ICD-10-CM | POA: Diagnosis present

## 2012-09-19 DIAGNOSIS — S8990XA Unspecified injury of unspecified lower leg, initial encounter: Secondary | ICD-10-CM | POA: Diagnosis not present

## 2012-09-19 DIAGNOSIS — S82841D Displaced bimalleolar fracture of right lower leg, subsequent encounter for closed fracture with routine healing: Secondary | ICD-10-CM

## 2012-09-19 NOTE — ED Notes (Signed)
[id number]> Expected date:<BR> Expected time:<BR> Means of arrival:<BR> Comments:<BR> EMS/79 yo fall with ankle pain

## 2012-09-19 NOTE — Progress Notes (Signed)
Subjective: 2 Days Post-Op Procedure(s) (LRB): REVERSE SHOULDER ARTHROPLASTY (Right) Patient reports pain as 2 on 0-10 scale.    Objective: Vital signs in last 24 hours: Temp:  [98 F (36.7 C)-99.2 F (37.3 C)] 98.1 F (36.7 C) (03/09 0647) Pulse Rate:  [78-92] 92 (03/09 0647) Resp:  [16-17] 17 (03/09 0647) BP: (125-139)/(53-55) 125/54 mmHg (03/09 0647) SpO2:  [93 %-98 %] 93 % (03/09 0647)  Intake/Output from previous day:   Intake/Output this shift:     Recent Labs  09/18/12 0555  HGB 12.4*    Recent Labs  09/18/12 0555  HCT 37.2*    Recent Labs  09/18/12 0555  NA 139  K 4.3  CL 102  CO2 24  BUN 20  CREATININE 0.77  GLUCOSE 167*  CALCIUM 9.2   No results found for this basename: LABPT, INR,  in the last 72 hours  Neurologically intact Intact pulses distally Incision: dressing C/D/I  Assessment/Plan: 2 Days Post-Op Procedure(s) (LRB): REVERSE SHOULDER ARTHROPLASTY (Right) Plan for discharge tomorrow as patient has no power at home now.  [name],[name] 09/19/2012, 10:06 AM

## 2012-09-19 NOTE — ED Notes (Signed)
Pt denies hitting head or LOC. Denies chest pain. Denies SOB.

## 2012-09-19 NOTE — ED Notes (Signed)
Report given via EMS. Pt was lowered to ground after sitting on toilet. Pt twisted ankle while being lowered down. Pt just got out of hospital this morning for right shoulder replacement. Pt rates pain 2/10. Not given medication on route since pt just took 2 hydrocodone at 1800. VS 118 palpated pulse 80 RR 16 at 2323.

## 2012-09-19 NOTE — Evaluation (Signed)
Occupational Therapy Evaluation Patient Details Name: Logan Bean MRN: [id number] DOB: [birth year] Today's Date: 09/19/2012 Time: 8413-2440 OT Time Calculation (min): 20 min  OT Assessment / Plan / Recommendation Clinical Impression  Pt s/p right reverse shoulder arthroplasty.  Educated pt on pendulums, RUE AAROM, and ADLs.  Pt with hx of left rotator cuff repair and reported being very familiar with pendulums/sling.  Pt verbalized and demonstrated understanding of all education and has good family support. Preparing for d/c later today. Will sign off.    OT Assessment  Progress rehab of shoulder as ordered by MD at follow-up appointment    Follow Up Recommendations  No OT follow up;Supervision/Assistance - 24 hour    Barriers to Discharge      Equipment Recommendations  None recommended by OT    Recommendations for Other Services    Frequency       Precautions / Restrictions Precautions Precautions: Shoulder Precaution Comments: educated pt on pendulums and ADL techniques   Pertinent Vitals/Pain See vitals    ADL  Eating/Feeding: Performed;Independent Where Assessed - Eating/Feeding: Edge of bed Grooming: Performed;Brushing hair;Modified independent (using LUE) Where Assessed - Grooming: Unsupported sitting Upper Body Dressing: Performed;Supervision/safety Where Assessed - Upper Body Dressing: Unsupported sitting Lower Body Dressing: Performed;Minimal assistance Where Assessed - Lower Body Dressing: Unsupported sit to stand Toilet Transfer: Simulated;Min guard Toilet Transfer Method: Sit to Barista:  (bed) Equipment Used: Other (comment) (rUE sling) Transfers/Ambulation Related to ADLs: min guard for safety. No LOB ADL Comments: Pt getting dressed on OT arrival.  Pt donned shirt keeping RUE under shirt (not through sleeve).  Asked pt if OT could assist with getting RUE through sleeve, and pt reported he preferred to have RUE this way.  Performed  pendulums at sup level and able to independently recall pendulums at end of session. Educated pt on using RUE as much as tolerated during ADL tasks.  Pt verbalized understanding.     OT Diagnosis:    OT Problem List:   OT Treatment Interventions:     OT Goals    Visit Information  Last OT Received On: 09/19/12 Assistance Needed: +1    Subjective Data      Prior Functioning     Home Living Lives With: Family Available Help at Discharge: Available 24 hours/day;Family Type of Home: House Home Access: Stairs to enter [employer] of Steps: 1 Home Layout: One level Bathroom Shower/Tub: Health visitor: Standard Home Adaptive Equipment: Straight cane;Shower chair without back Prior Function Level of Independence: Independent Communication Communication: No difficulties Dominant Hand: Right         Vision/Perception     Cognition  Cognition Overall Cognitive Status: Appears within functional limits for tasks assessed/performed Arousal/Alertness: Awake/alert Orientation Level: Appears intact for tasks assessed Behavior During Session: [facility] for tasks performed    Extremity/Trunk Assessment Right Upper Extremity Assessment RUE ROM/Strength/Tone: Deficits RUE ROM/Strength/Tone Deficits: limited ROM.  AAROM shoulder flex/abduction ~40. digit, wrist, elbow WFL. Left Upper Extremity Assessment LUE ROM/Strength/Tone: WFL for tasks assessed (hx of rotator cuff repair)     Mobility Bed Mobility Bed Mobility: Not assessed (pt sitting EOB) Transfers Transfers: Sit to Stand;Stand to Sit Sit to Stand: 4: Min guard;From bed Stand to Sit: 4: Min guard;To bed     Exercise Shoulder Exercises Pendulum Exercise: AAROM;Right;10 reps Donning/doffing shirt without moving shoulder: Supervision/safety Method for sponge bathing under operated UE: Supervision/safety Donning/doffing sling/immobilizer: Minimal assistance Correct positioning of  sling/immobilizer: Minimal assistance Pendulum exercises (written home  exercise program): Supervision/safety ROM for elbow, wrist and digits of operated UE: Modified independent   Balance     End of Session OT - End of Session Equipment Utilized During Treatment:  (RUE sling) Activity Tolerance: Patient tolerated treatment well Patient left: in bed;with call bell/phone within reach Nurse Communication: Mobility status  GO    09/19/2012 Cipriano Mile OTR/L Pager [phone] Office [phone]  Cipriano Mile 09/19/2012, 4:14 PM

## 2012-09-20 ENCOUNTER — Inpatient Hospital Stay (HOSPITAL_COMMUNITY): Payer: Medicare Other

## 2012-09-20 ENCOUNTER — Inpatient Hospital Stay (HOSPITAL_COMMUNITY): Payer: Medicare Other | Admitting: Anesthesiology

## 2012-09-20 ENCOUNTER — Encounter (HOSPITAL_COMMUNITY): Payer: Self-pay | Admitting: Internal Medicine

## 2012-09-20 ENCOUNTER — Encounter (HOSPITAL_COMMUNITY): Payer: Self-pay | Admitting: Anesthesiology

## 2012-09-20 ENCOUNTER — Emergency Department (HOSPITAL_COMMUNITY): Payer: Medicare Other

## 2012-09-20 ENCOUNTER — Encounter (HOSPITAL_COMMUNITY)
Admission: EM | Disposition: A | Discharge status: Skilled Nursing Facility | Payer: Self-pay | Source: Home / Self Care | Attending: Internal Medicine

## 2012-09-20 HISTORY — PX: ORIF ANKLE FRACTURE: SHX5408

## 2012-09-20 LAB — BASIC METABOLIC PANEL
BUN: 22 mg/dL (ref 6–23)
BUN: 19 mg/dL (ref 6–23)
CO2: 26 mEq/L (ref 19–32)
Calcium: 8.8 mg/dL (ref 8.4–10.5)
Calcium: 8.1 mg/dL — ABNORMAL LOW (ref 8.4–10.5)
Chloride: 103 mEq/L (ref 96–112)
Creatinine, Ser: 0.67 mg/dL (ref 0.50–1.35)
GFR calc non Af Amer: 76 mL/min — ABNORMAL LOW (ref >90)
Glucose, Bld: 187 mg/dL — ABNORMAL HIGH (ref 70–99)
Glucose, Bld: 143 mg/dL — ABNORMAL HIGH (ref 70–99)

## 2012-09-20 LAB — GLUCOSE, CAPILLARY: Glucose-Capillary: 135 mg/dL — ABNORMAL HIGH (ref 70–99)

## 2012-09-20 LAB — CBC
HCT: 35.4 % — ABNORMAL LOW (ref 39.0–52.0)
HCT: 32.5 % — ABNORMAL LOW (ref 39.0–52.0)
Hemoglobin: 11.7 g/dL — ABNORMAL LOW (ref 13.0–17.0)
MCH: 29 pg (ref 26.0–34.0)
MCH: 28.6 pg (ref 26.0–34.0)
MCHC: 33.1 g/dL (ref 30.0–36.0)
MCV: 87.8 fL (ref 78.0–100.0)
MCV: 87.6 fL (ref 78.0–100.0)
Platelets: 174 10*3/uL (ref 150–400)
RBC: 3.71 MIL/uL — ABNORMAL LOW (ref 4.22–5.81)
WBC: 13.3 10*3/uL — ABNORMAL HIGH (ref 4.0–10.5)

## 2012-09-20 SURGERY — OPEN REDUCTION INTERNAL FIXATION (ORIF) ANKLE FRACTURE
Anesthesia: General | Site: Ankle | Laterality: Right | Wound class: Clean

## 2012-09-20 MED ORDER — FENTANYL CITRATE 0.05 MG/ML IJ SOLN
INTRAMUSCULAR | Status: DC | PRN
  Administered 2012-09-20: 100 ug via INTRAVENOUS
  Administered 2012-09-20: 50 ug via INTRAVENOUS
  Administered 2012-09-20: 100 ug via INTRAVENOUS

## 2012-09-20 MED ORDER — AMLODIPINE BESYLATE 5 MG PO TABS
5.0000 mg | ORAL_TABLET | Freq: Every day | ORAL | Status: DC
  Administered 2012-09-20 – 2012-09-28 (×8): 5 mg via ORAL
  Filled 2012-09-20 (×10): qty 1

## 2012-09-20 MED ORDER — BUPIVACAINE-EPINEPHRINE PF 0.25-1:200000 % IJ SOLN
INTRAMUSCULAR | Status: AC
  Filled 2012-09-20: qty 30

## 2012-09-20 MED ORDER — LACTATED RINGERS IV SOLN: INTRAVENOUS | Status: DC

## 2012-09-20 MED ORDER — CEFAZOLIN SODIUM-DEXTROSE 2-3 GM-% IV SOLR
2.0000 g | Freq: Once | INTRAVENOUS | Status: AC
  Administered 2012-09-20: 2 g via INTRAVENOUS

## 2012-09-20 MED ORDER — METHOCARBAMOL 500 MG PO TABS
500.0000 mg | ORAL_TABLET | Freq: Three times a day (TID) | ORAL | Status: DC | PRN
  Administered 2012-09-27: 500 mg via ORAL
  Filled 2012-09-20: qty 1

## 2012-09-20 MED ORDER — SENNOSIDES-DOCUSATE SODIUM 8.6-50 MG PO TABS
1.0000 | ORAL_TABLET | Freq: Every evening | ORAL | Status: DC | PRN
  Administered 2012-09-22: 1 via ORAL
  Filled 2012-09-20: qty 1

## 2012-09-20 MED ORDER — ONDANSETRON HCL 4 MG PO TABS: 4.0000 mg | ORAL_TABLET | Freq: Four times a day (QID) | ORAL | Status: DC | PRN

## 2012-09-20 MED ORDER — ASPIRIN EC 81 MG PO TBEC
81.0000 mg | DELAYED_RELEASE_TABLET | Freq: Every day | ORAL | Status: DC
  Administered 2012-09-20 – 2012-09-21 (×2): 81 mg via ORAL
  Filled 2012-09-20 (×3): qty 1

## 2012-09-20 MED ORDER — SIMVASTATIN 20 MG PO TABS
20.0000 mg | ORAL_TABLET | Freq: Every day | ORAL | Status: DC
  Administered 2012-09-20 – 2012-09-28 (×9): 20 mg via ORAL
  Filled 2012-09-20 (×10): qty 1

## 2012-09-20 MED ORDER — ONDANSETRON HCL 4 MG/2ML IJ SOLN
INTRAMUSCULAR | Status: DC | PRN
  Administered 2012-09-20: 4 mg via INTRAVENOUS

## 2012-09-20 MED ORDER — PROMETHAZINE HCL 25 MG/ML IJ SOLN: 6.2500 mg | INTRAMUSCULAR | Status: DC | PRN

## 2012-09-20 MED ORDER — METOCLOPRAMIDE HCL 5 MG/ML IJ SOLN: 5.0000 mg | Freq: Three times a day (TID) | INTRAMUSCULAR | Status: DC | PRN

## 2012-09-20 MED ORDER — ONDANSETRON HCL 4 MG/2ML IJ SOLN: 4.0000 mg | Freq: Four times a day (QID) | INTRAMUSCULAR | Status: DC | PRN

## 2012-09-20 MED ORDER — CEFAZOLIN SODIUM-DEXTROSE 2-3 GM-% IV SOLR
INTRAVENOUS | Status: AC
  Filled 2012-09-20: qty 50

## 2012-09-20 MED ORDER — 0.9 % SODIUM CHLORIDE (POUR BTL) OPTIME
TOPICAL | Status: DC | PRN
  Administered 2012-09-20: 1000 mL

## 2012-09-20 MED ORDER — KETAMINE HCL 10 MG/ML IJ SOLN
0.5000 mg/kg | Freq: Once | INTRAMUSCULAR | Status: AC
  Administered 2012-09-20: 20 mg via INTRAVENOUS

## 2012-09-20 MED ORDER — LACTATED RINGERS IV SOLN
INTRAVENOUS | Status: DC | PRN
  Administered 2012-09-20: 18:00:00 via INTRAVENOUS

## 2012-09-20 MED ORDER — PROPOFOL 10 MG/ML IV BOLUS
0.5000 mg/kg | Freq: Once | INTRAVENOUS | Status: AC
  Administered 2012-09-20: 20 mg via INTRAVENOUS
  Filled 2012-09-20: qty 1

## 2012-09-20 MED ORDER — BISACODYL 5 MG PO TBEC: 5.0000 mg | DELAYED_RELEASE_TABLET | Freq: Every day | ORAL | Status: DC | PRN

## 2012-09-20 MED ORDER — DIPHENHYDRAMINE HCL 12.5 MG/5ML PO ELIX: 12.5000 mg | ORAL_SOLUTION | ORAL | Status: DC | PRN

## 2012-09-20 MED ORDER — ENOXAPARIN SODIUM 30 MG/0.3ML ~~LOC~~ SOLN
30.0000 mg | SUBCUTANEOUS | Status: DC
  Administered 2012-09-21 – 2012-09-29 (×9): 30 mg via SUBCUTANEOUS
  Filled 2012-09-20 (×10): qty 0.3

## 2012-09-20 MED ORDER — LISINOPRIL 20 MG PO TABS
20.0000 mg | ORAL_TABLET | Freq: Every day | ORAL | Status: DC
  Administered 2012-09-20 – 2012-09-28 (×8): 20 mg via ORAL
  Filled 2012-09-20 (×10): qty 1

## 2012-09-20 MED ORDER — PROPOFOL 10 MG/ML IV BOLUS
INTRAVENOUS | Status: DC | PRN
  Administered 2012-09-20: 150 mg via INTRAVENOUS

## 2012-09-20 MED ORDER — SODIUM CHLORIDE 0.9 % IV SOLN
INTRAVENOUS | Status: DC
  Administered 2012-09-20 – 2012-09-22 (×2): via INTRAVENOUS

## 2012-09-20 MED ORDER — ZOLPIDEM TARTRATE 5 MG PO TABS: 5.0000 mg | ORAL_TABLET | Freq: Every evening | ORAL | Status: DC | PRN

## 2012-09-20 MED ORDER — METOCLOPRAMIDE HCL 10 MG PO TABS: 5.0000 mg | ORAL_TABLET | Freq: Three times a day (TID) | ORAL | Status: DC | PRN

## 2012-09-20 MED ORDER — HYDROCODONE-ACETAMINOPHEN 5-325 MG PO TABS
1.0000 | ORAL_TABLET | Freq: Four times a day (QID) | ORAL | Status: DC | PRN
  Administered 2012-09-21 – 2012-09-29 (×5): 1 via ORAL
  Filled 2012-09-20 (×5): qty 1

## 2012-09-20 MED ORDER — DOCUSATE SODIUM 100 MG PO CAPS
100.0000 mg | ORAL_CAPSULE | Freq: Two times a day (BID) | ORAL | Status: DC
  Filled 2012-09-20 (×5): qty 1

## 2012-09-20 MED ORDER — ACETAMINOPHEN 10 MG/ML IV SOLN
INTRAVENOUS | Status: AC
  Filled 2012-09-20: qty 100

## 2012-09-20 MED ORDER — LACTATED RINGERS IV SOLN
INTRAVENOUS | Status: DC
  Administered 2012-09-20: 1000 mL via INTRAVENOUS

## 2012-09-20 MED ORDER — BLISTEX EX OINT
TOPICAL_OINTMENT | CUTANEOUS | Status: AC
  Filled 2012-09-20: qty 10

## 2012-09-20 MED ORDER — PANTOPRAZOLE SODIUM 40 MG PO TBEC
40.0000 mg | DELAYED_RELEASE_TABLET | Freq: Every day | ORAL | Status: DC
  Administered 2012-09-21 – 2012-09-24 (×4): 40 mg via ORAL
  Filled 2012-09-20 (×5): qty 1

## 2012-09-20 MED ORDER — FLEET ENEMA 7-19 GM/118ML RE ENEM: 1.0000 | ENEMA | Freq: Once | RECTAL | Status: AC | PRN

## 2012-09-20 MED ORDER — HYDROMORPHONE HCL PF 1 MG/ML IJ SOLN
INTRAMUSCULAR | Status: DC | PRN
  Administered 2012-09-20 (×4): 0.5 mg via INTRAVENOUS

## 2012-09-20 MED ORDER — ACETAMINOPHEN 10 MG/ML IV SOLN
INTRAVENOUS | Status: DC | PRN
  Administered 2012-09-20: 1000 mg via INTRAVENOUS

## 2012-09-20 MED ORDER — HYDROMORPHONE HCL PF 1 MG/ML IJ SOLN: 0.5000 mg | INTRAMUSCULAR | Status: DC | PRN

## 2012-09-20 MED ORDER — LIDOCAINE HCL (PF) 2 % IJ SOLN
INTRAMUSCULAR | Status: DC | PRN
  Administered 2012-09-20: 50 mg

## 2012-09-20 MED ORDER — ADULT MULTIVITAMIN W/MINERALS CH
1.0000 | ORAL_TABLET | Freq: Every day | ORAL | Status: DC
  Administered 2012-09-21 – 2012-09-28 (×8): 1 via ORAL
  Filled 2012-09-20 (×10): qty 1

## 2012-09-20 MED ORDER — CEFAZOLIN SODIUM-DEXTROSE 2-3 GM-% IV SOLR
2.0000 g | Freq: Four times a day (QID) | INTRAVENOUS | Status: AC
  Administered 2012-09-21 (×2): 2 g via INTRAVENOUS
  Filled 2012-09-20 (×2): qty 50

## 2012-09-20 MED ORDER — FENTANYL CITRATE 0.05 MG/ML IJ SOLN
25.0000 ug | INTRAMUSCULAR | Status: DC | PRN
  Administered 2012-09-20: 25 ug via INTRAVENOUS

## 2012-09-20 SURGICAL SUPPLY — 52 items
BAG ZIPLOCK 12X15 (MISCELLANEOUS) ×2 IMPLANT
BANDAGE GAUZE ELAST BULKY 4 IN (GAUZE/BANDAGES/DRESSINGS) IMPLANT
BIT DRILL 2.5X2.75 QC CALB (BIT) ×2 IMPLANT
CHLORAPREP W/TINT 26ML (MISCELLANEOUS) IMPLANT
CLOTH BEACON ORANGE TIMEOUT ST (SAFETY) ×2 IMPLANT
CUFF TOURN SGL QUICK 34 (TOURNIQUET CUFF) ×1
CUFF TRNQT CYL 34X4X40X1 (TOURNIQUET CUFF) ×1 IMPLANT
DRAPE C-ARM 42X72 X-RAY (DRAPES) ×2 IMPLANT
DRAPE POUCH INSTRU U-SHP 10X18 (DRAPES) ×2 IMPLANT
DRAPE U-SHAPE 47X51 STRL (DRAPES) IMPLANT
DRSG ADAPTIC 3X8 NADH LF (GAUZE/BANDAGES/DRESSINGS) IMPLANT
DRSG PAD ABDOMINAL 8X10 ST (GAUZE/BANDAGES/DRESSINGS) IMPLANT
DURAPREP 26ML APPLICATOR (WOUND CARE) ×2 IMPLANT
ELECT REM PT RETURN 9FT ADLT (ELECTROSURGICAL) ×2
ELECTRODE REM PT RTRN 9FT ADLT (ELECTROSURGICAL) ×1 IMPLANT
GLOVE BIOGEL PI IND STRL 7.5 (GLOVE) ×1 IMPLANT
GLOVE BIOGEL PI IND STRL 8 (GLOVE) ×1 IMPLANT
GLOVE BIOGEL PI INDICATOR 7.5 (GLOVE) ×1
GLOVE BIOGEL PI INDICATOR 8 (GLOVE) ×1
GLOVE SURG SS PI 7.5 STRL IVOR (GLOVE) ×2 IMPLANT
GLOVE SURG SS PI 8.0 STRL IVOR (GLOVE) ×4 IMPLANT
GOWN PREVENTION PLUS LG XLONG (DISPOSABLE) ×2 IMPLANT
GOWN STRL REIN XL XLG (GOWN DISPOSABLE) ×4 IMPLANT
K-WIRE ACE 1.6X6 (WIRE) ×4
KIT BASIN OR (CUSTOM PROCEDURE TRAY) ×2 IMPLANT
KWIRE ACE 1.6X6 (WIRE) ×2 IMPLANT
MANIFOLD NEPTUNE II (INSTRUMENTS) ×2 IMPLANT
NEEDLE HYPO 22GX1.5 SAFETY (NEEDLE) ×2 IMPLANT
PACK LOWER EXTREMITY WL (CUSTOM PROCEDURE TRAY) ×2 IMPLANT
PAD CAST 4YDX4 CTTN HI CHSV (CAST SUPPLIES) ×2 IMPLANT
PADDING CAST ABS 4INX4YD NS (CAST SUPPLIES) ×2
PADDING CAST ABS COTTON 4X4 ST (CAST SUPPLIES) ×2 IMPLANT
PADDING CAST COTTON 4X4 STRL (CAST SUPPLIES) ×2
PLATE LOCK 7H 92 BILAT FIB (Plate) ×2 IMPLANT
POSITIONER SURGICAL ARM (MISCELLANEOUS) ×2 IMPLANT
SCREW ACE CAN 4.0 40M (Screw) ×2 IMPLANT
SCREW ACE CAN 4.0 44M (Screw) ×2 IMPLANT
SCREW LOCK CORT STAR 3.5X10 (Screw) ×4 IMPLANT
SCREW NON LOCKING LP 3.5 14MM (Screw) ×2 IMPLANT
SCREW NON LOCKING LP 3.5 16MM (Screw) ×4 IMPLANT
SPLINT FIBERGLASS 4X30 (CAST SUPPLIES) ×4 IMPLANT
SPONGE GAUZE 4X4 12PLY (GAUZE/BANDAGES/DRESSINGS) IMPLANT
STAPLER VISISTAT (STAPLE) ×2 IMPLANT
SUCTION FRAZIER 12FR DISP (SUCTIONS) ×2 IMPLANT
SUT ETHILON 4 0 PS 2 18 (SUTURE) IMPLANT
SUT VIC AB 1 CT1 27 (SUTURE) ×2
SUT VIC AB 1 CT1 27XBRD ANTBC (SUTURE) ×2 IMPLANT
SUT VIC AB 2-0 CT1 27 (SUTURE) ×2
SUT VIC AB 2-0 CT1 TAPERPNT 27 (SUTURE) ×2 IMPLANT
SYR CONTROL 10ML LL (SYRINGE) ×2 IMPLANT
WASHER FLAT ACE (Orthopedic Implant) ×1 IMPLANT
WASHER PLAIN FLAT ACE NS 3PK (Orthopedic Implant) ×1 IMPLANT

## 2012-09-20 NOTE — ED Notes (Addendum)
Additional 2 ml of Ketamine and Propofol mixed together given. 

## 2012-09-20 NOTE — ED Notes (Signed)
Right ankle splinted.

## 2012-09-20 NOTE — H&P (View-Only) (Signed)
Reason for Consult: Right ankle pain Referring Physician: EDP  Logan Bean is an 79 y.o. male.  HPI: Fell at home. C/O of pain  Past Medical History  Diagnosis Date  . Hypertension   . Hypercholesteremia   . Stomach ulcer   . H/O hiatal hernia   . Varicose veins     both legs  . Arthritis     left knee  . Diabetes mellitus     diet controlled, pt does not check cbg  . Left rotator cuff tear Jul 12, 2012    Past Surgical History  Procedure Laterality Date  . Esopagus strectching  2003 and 3 yrs ago  . Tonsillectomy  age 17  . Shoulder open rotator cuff repair  11/05/2011    Procedure: ROTATOR CUFF REPAIR SHOULDER OPEN;  Surgeon: Ronald A Gioffre, MD;  Location: [facility];  Service: Orthopedics;  Laterality: Left;  Left Shoulder Rotator Cuff Repair Open with Graft and Anchors    Family History  Problem Relation Age of Onset  . Hip fracture Mother     Social History:  reports that he quit smoking about 16 years ago. His smoking use included Pipe and Cigarettes. He smoked 0.00 packs per day for 50 years. He has never used smokeless tobacco. He reports that  drinks alcohol. He reports that he does not use illicit drugs.  Allergies:  Allergies  Allergen Reactions  . Morphine And Related Nausea And Vomiting  . Oxycodone Nausea And Vomiting    Medications: I have reviewed the patient's current medications.  Results for orders placed during the hospital encounter of 09/19/12 (from the past 48 hour(s))  CBC     Status: Abnormal   Collection Time    09/20/12  1:17 AM      Result Value Range   WBC 16.0 (*) 4.0 - 10.5 K/uL   RBC 4.03 (*) 4.22 - 5.81 MIL/uL   Hemoglobin 11.7 (*) 13.0 - 17.0 g/dL   HCT 35.4 (*) 39.0 - 52.0 %   MCV 87.8  78.0 - 100.0 fL   MCH 29.0  26.0 - 34.0 pg   MCHC 33.1  30.0 - 36.0 g/dL   RDW 14.7  11.5 - 15.5 %   Platelets 194  150 - 400 K/uL  BASIC METABOLIC PANEL     Status: Abnormal   Collection Time    09/20/12  1:17 AM      Result Value Range    Sodium 135  135 - 145 mEq/L   Potassium 4.2  3.5 - 5.1 mEq/L   Chloride 99  96 - 112 mEq/L   CO2 26  19 - 32 mEq/L   Glucose, Bld 187 (*) 70 - 99 mg/dL   BUN 22  6 - 23 mg/dL   Creatinine, Ser 0.82  0.50 - 1.35 mg/dL   Calcium 8.8  8.4 - 10.5 mg/dL   GFR calc non Af Amer 76 (*) >90 mL/min   GFR calc Af Amer 88 (*) >90 mL/min   Comment:            The eGFR has been calculated     using the CKD EPI equation.     This calculation has not been     validated in all clinical     situations.     eGFR's persistently     <90 mL/min signify     possible Chronic Kidney Disease.    Dg Ankle Complete Right  09/20/2012  *RADIOLOGY REPORT*  Clinical   Data: Ankle pain and deformity post fall  RIGHT ANKLE - COMPLETE 3+ VIEW  Comparison: None  Findings: Marked osseous demineralization. Medial and lateral malleolar fractures with lateral displacement. Marked lateral subluxation of the talus and ankle joint. Posterior margin of distal tibia appears grossly intact. Visualized tarsals intact.  IMPRESSION: Bimalleolar fractures right ankle with significant lateral displacement of fracture fragments and lateral subluxation of talus.   Original Report Authenticated By: Mark Boles, M.D.    Dg Knee Complete 4 Views Right  09/20/2012  *RADIOLOGY REPORT*  Clinical Data:  Knee pain and swelling post fall  RIGHT KNEE - COMPLETE 4+ VIEW  Comparison: None  Findings: Osseous demineralization. Diffuse joint space narrowing. No acute fracture, dislocation, or bone destruction. Scattered atherosclerotic calcification. No definite knee joint effusion.  IMPRESSION: No definite acute osseous abnormalities.   Original Report Authenticated By: Mark Boles, M.D.     Review of Systems  Musculoskeletal: Positive for joint pain.  All other systems reviewed and are negative.   Blood pressure 148/57, pulse 89, temperature 98.1 F (36.7 C), temperature source Oral, resp. rate 22, height 5' 5" (1.651 m), weight 68.04 kg (150 lb),  SpO2 98.00%. Physical Exam  Vitals reviewed. Constitutional: He is oriented to person, place, and time. He appears well-developed.  HENT:  Head: Normocephalic.  Eyes: Pupils are equal, round, and reactive to light.  Neck: Normal range of motion.  Cardiovascular: Normal rate.   Respiratory: Effort normal.  GI: Soft.  Musculoskeletal:  Splint intact. NVI. Compartments soft. Good capillary refill.  Neurological: He is alert and oriented to person, place, and time.  Skin: Skin is warm and dry.  Psychiatric: He has a normal mood and affect.    Assessment/Plan: Closed right ankle fracture. Plan ORIF. Risks discussed. Check xrays post reduction.  [name],[name] 09/20/2012, 6:50 AM      

## 2012-09-20 NOTE — Transfer of Care (Signed)
Immediate Anesthesia Transfer of Care Note  Patient: Logan Bean  Procedure(s) Performed: Procedure(s): OPEN REDUCTION INTERNAL FIXATION (ORIF) ANKLE FRACTURE (Right)  Patient Location: PACU  Anesthesia Type:General  Level of Consciousness: awake and sedated  Airway & Oxygen Therapy: Patient Spontanous Breathing and Patient connected to face mask oxygen  Post-op Assessment: Report given to PACU RN and Post -op Vital signs reviewed and stable  Post vital signs: Reviewed and stable  Complications: No apparent anesthesia complications

## 2012-09-20 NOTE — Anesthesia Preprocedure Evaluation (Addendum)
Anesthesia Evaluation  Patient identified by MRN, date of birth, ID band Patient awake    Reviewed: Allergy & Precautions, H&P , NPO status , Patient's Chart, lab work & pertinent test results  Airway Mallampati: II TM Distance: >3 FB Neck ROM: full    Dental no notable dental hx. (+) Teeth Intact and Dental Advisory Given   Pulmonary neg pulmonary ROS,  breath sounds clear to auscultation  Pulmonary exam normal       Cardiovascular Exercise Tolerance: Good hypertension, Pt. on medications Rhythm:regular Rate:Normal     Neuro/Psych  Neuromuscular disease negative psych ROS   GI/Hepatic Neg liver ROS, hiatal hernia, PUD, GERD-  Medicated and Controlled,  Endo/Other  diabetes, Well Controlled, Type 2, Oral Hypoglycemic AgentsDiet DM  Renal/GU negative Renal ROS  negative genitourinary   Musculoskeletal negative musculoskeletal ROS (+)   Abdominal   Peds negative pediatric ROS (+)  Hematology negative hematology ROS (+)   Anesthesia Other Findings   Reproductive/Obstetrics negative OB ROS                           Anesthesia Physical Anesthesia Plan  ASA: III  Anesthesia Plan: General   Post-op Pain Management:    Induction: Intravenous  Airway Management Planned: LMA  Additional Equipment:   Intra-op Plan:   Post-operative Plan: Extubation in OR  Informed Consent: I have reviewed the patients History and Physical, chart, labs and discussed the procedure including the risks, benefits and alternatives for the proposed anesthesia with the patient or authorized representative who has indicated his/her understanding and acceptance.   Dental advisory given  Plan Discussed with: CRNA  Anesthesia Plan Comments:         Anesthesia Quick Evaluation

## 2012-09-20 NOTE — Brief Op Note (Signed)
09/19/2012 - 09/20/2012  7:06 PM  PATIENT:  Logan Bean  79 y.o. male  PRE-OPERATIVE DIAGNOSIS:  right ankle fracture  POST-OPERATIVE DIAGNOSIS:  right ankle fracture  PROCEDURE:  Procedure(s): OPEN REDUCTION INTERNAL FIXATION (ORIF) ANKLE FRACTURE (Right)  SURGEON:  Surgeon(s) and Role:    * Javier Docker, MD - Primary  PHYSICIAN ASSISTANT:   ASSISTANTS: Bissell   ANESTHESIA:   general  EBL:     BLOOD ADMINISTERED:  DRAINS: none   LOCAL MEDICATIONS USED:  NONE  SPECIMEN:  No Specimen  DISPOSITION OF SPECIMEN:  N/A  COUNTS:  YES  TOURNIQUET:   Total Tourniquet Time Documented: Thigh (Right) - 41 minutes Total: Thigh (Right) - 41 minutes   DICTATION: .Other Dictation: Dictation Number [id number]  PLAN OF CARE: Admit to inpatient   PATIENT DISPOSITION:  PACU - hemodynamically stable.   Delay start of Pharmacological VTE agent (>24hrs) due to surgical blood loss or risk of bleeding: no

## 2012-09-20 NOTE — Consult Note (Signed)
Reason for Consult: Right ankle pain Referring Physician: EDP  Logan Bean is an 79 y.o. male.  HPI: Larey Seat at home. C/O of pain  Past Medical History  Diagnosis Date  . Hypertension   . Hypercholesteremia   . Stomach ulcer   . H/O hiatal hernia   . Varicose veins     both legs  . Arthritis     left knee  . Diabetes mellitus     diet controlled, pt does not check cbg  . Left rotator cuff tear Jul 12, 2012    Past Surgical History  Procedure Laterality Date  . Esopagus strectching  2003 and 3 yrs ago  . Tonsillectomy  age 80  . Shoulder open rotator cuff repair  11/05/2011    Procedure: ROTATOR CUFF REPAIR SHOULDER OPEN;  Surgeon: Jacki Cones, MD;  Location: [facility];  Service: Orthopedics;  Laterality: Left;  Left Shoulder Rotator Cuff Repair Open with Graft and Anchors    Family History  Problem Relation Age of Onset  . Hip fracture Mother     Social History:  reports that he quit smoking about 16 years ago. His smoking use included Pipe and Cigarettes. He smoked 0.00 packs per day for 50 years. He has never used smokeless tobacco. He reports that  drinks alcohol. He reports that he does not use illicit drugs.  Allergies:  Allergies  Allergen Reactions  . Morphine And Related Nausea And Vomiting  . Oxycodone Nausea And Vomiting    Medications: I have reviewed the patient's current medications.  Results for orders placed during the hospital encounter of 09/19/12 (from the past 48 hour(s))  CBC     Status: Abnormal   Collection Time    09/20/12  1:17 AM      Result Value Range   WBC 16.0 (*) 4.0 - 10.5 K/uL   RBC 4.03 (*) 4.22 - 5.81 MIL/uL   Hemoglobin 11.7 (*) 13.0 - 17.0 g/dL   HCT 19.1 (*) 47.8 - 29.5 %   MCV 87.8  78.0 - 100.0 fL   MCH 29.0  26.0 - 34.0 pg   MCHC 33.1  30.0 - 36.0 g/dL   RDW 62.1  30.8 - 65.7 %   Platelets 194  150 - 400 K/uL  BASIC METABOLIC PANEL     Status: Abnormal   Collection Time    09/20/12  1:17 AM      Result Value Range    Sodium 135  135 - 145 mEq/L   Potassium 4.2  3.5 - 5.1 mEq/L   Chloride 99  96 - 112 mEq/L   CO2 26  19 - 32 mEq/L   Glucose, Bld 187 (*) 70 - 99 mg/dL   BUN 22  6 - 23 mg/dL   Creatinine, Ser 8.46  0.50 - 1.35 mg/dL   Calcium 8.8  8.4 - 96.2 mg/dL   GFR calc non Af Amer 76 (*) >90 mL/min   GFR calc Af Amer 88 (*) >90 mL/min   Comment:            The eGFR has been calculated     using the CKD EPI equation.     This calculation has not been     validated in all clinical     situations.     eGFR's persistently     <90 mL/min signify     possible Chronic Kidney Disease.    Dg Ankle Complete Right  09/20/2012  *RADIOLOGY REPORT*  Clinical  Data: Ankle pain and deformity post fall  RIGHT ANKLE - COMPLETE 3+ VIEW  Comparison: None  Findings: Marked osseous demineralization. Medial and lateral malleolar fractures with lateral displacement. Marked lateral subluxation of the talus and ankle joint. Posterior margin of distal tibia appears grossly intact. Visualized tarsals intact.  IMPRESSION: Bimalleolar fractures right ankle with significant lateral displacement of fracture fragments and lateral subluxation of talus.   Original Report Authenticated By: Ulyses Southward, M.D.    Dg Knee Complete 4 Views Right  09/20/2012  *RADIOLOGY REPORT*  Clinical Data:  Knee pain and swelling post fall  RIGHT KNEE - COMPLETE 4+ VIEW  Comparison: None  Findings: Osseous demineralization. Diffuse joint space narrowing. No acute fracture, dislocation, or bone destruction. Scattered atherosclerotic calcification. No definite knee joint effusion.  IMPRESSION: No definite acute osseous abnormalities.   Original Report Authenticated By: Ulyses Southward, M.D.     Review of Systems  Musculoskeletal: Positive for joint pain.  All other systems reviewed and are negative.   Blood pressure 148/57, pulse 89, temperature 98.1 F (36.7 C), temperature source Oral, resp. rate 22, height 5\' 5"  (1.651 m), weight 68.04 kg (150 lb),  SpO2 98.00%. Physical Exam  Vitals reviewed. Constitutional: He is oriented to person, place, and time. He appears well-developed.  HENT:  Head: Normocephalic.  Eyes: Pupils are equal, round, and reactive to light.  Neck: Normal range of motion.  Cardiovascular: Normal rate.   Respiratory: Effort normal.  GI: Soft.  Musculoskeletal:  Splint intact. NVI. Compartments soft. Good capillary refill.  Neurological: He is alert and oriented to person, place, and time.  Skin: Skin is warm and dry.  Psychiatric: He has a normal mood and affect.    Assessment/Plan: Closed right ankle fracture. Plan ORIF. Risks discussed. Check xrays post reduction.  [name],[name] 09/20/2012, 6:50 AM

## 2012-09-20 NOTE — ED Notes (Signed)
XR at bedside

## 2012-09-20 NOTE — H&P (Signed)
[facility] History and Physical  Rebekah Canoy NGE:[id number] DOB: 12-03-1920 DOA: 09/19/2012  Referring physician: ED PCP: No primary provider on file.  Specialists: Ranell Patrick (Ortho)  Chief Complaint: Ankle pain HPI: Logan Bean is a 79 y.o. male who unfortunately lost his ballance after getting up after using the toilet and twisted his ankle while "lowering" himself to the ground.  The patient had just gotten out of the hospital in the morning of 3/9 after undergoing a R shoulder replacement on 3/8.  He was not able to bear weight on his ankle which he notes was very painful and seemed to be deformed after the injury.  He did not try anything for the pain, trying to move his ankle makes the pain worse.  He presented to the ED due to concern of fracture.  In the ED physical exam and X ray demonstrated that he had a bimalleolar fracture with significant lateral displacement of fracture fragments and lateral subluxation of talus.  Dr. Rulon Abide is currently attempting manual closed reduction of the fracture, [place] has been consulted, and hospitalist has been called to admit.  Review of Systems: Positive for brusing and swelling below his shoulder surgery site.  12 systems reviewed and otherwise negative.  Past Medical History  Diagnosis Date  . Hypertension   . Hypercholesteremia   . Stomach ulcer   . H/O hiatal hernia   . Varicose veins     both legs  . Arthritis     left knee  . Diabetes mellitus     diet controlled, pt does not check cbg  . Left rotator cuff tear Jul 12, 2012   Past Surgical History  Procedure Laterality Date  . Esopagus strectching  2003 and 3 yrs ago  . Tonsillectomy  age 84  . Shoulder open rotator cuff repair  11/05/2011    Procedure: ROTATOR CUFF REPAIR SHOULDER OPEN;  Surgeon: Jacki Cones, MD;  Location: [facility];  Service: Orthopedics;  Laterality: Left;  Left Shoulder Rotator Cuff Repair Open with Graft and Anchors   Social History:  reports that he  quit smoking about 16 years ago. His smoking use included Pipe and Cigarettes. He smoked 0.00 packs per day for 50 years. He has never used smokeless tobacco. He reports that  drinks alcohol. He reports that he does not use illicit drugs.   Allergies  Allergen Reactions  . Morphine And Related Nausea And Vomiting  . Oxycodone Nausea And Vomiting    Family History  Problem Relation Age of Onset  . Hip fracture Mother      Prior to Admission medications   Medication Sig Start Date End Date Taking? Authorizing Provider  acetaminophen (TYLENOL) 650 MG CR tablet Take 650 mg by mouth daily as needed (joint pain).   Yes Historical Provider, MD  amLODipine (NORVASC) 5 MG tablet Take 5 mg by mouth at bedtime.    Yes Historical Provider, MD  aspirin EC 81 MG tablet Take 81 mg by mouth at bedtime.    Yes Historical Provider, MD  HYDROcodone-acetaminophen (NORCO) 5-325 MG per tablet Take 1 tablet by mouth every 6 (six) hours as needed for pain. 09/17/12  Yes Verlee Rossetti, MD  lisinopril (PRINIVIL,ZESTRIL) 20 MG tablet Take 20 mg by mouth at bedtime.    Yes Historical Provider, MD  Multiple Vitamin (MULITIVITAMIN WITH MINERALS) TABS Take 1 tablet by mouth daily.   Yes Historical Provider, MD  Omega-3 Fatty Acids (FISH OIL) 1200 MG CAPS Take 1,200 mg by  mouth daily.   Yes Historical Provider, MD  omeprazole (PRILOSEC) 20 MG capsule Take 20 mg by mouth daily before breakfast.   Yes Historical Provider, MD  OVER THE COUNTER MEDICATION Take 2 tablets by mouth daily. Glucosamine + chrondroitin 1500/1200mg    Yes Historical Provider, MD  simvastatin (ZOCOR) 20 MG tablet Take 20 mg by mouth at bedtime.    Yes Historical Provider, MD  methocarbamol (ROBAXIN) 500 MG tablet Take 1 tablet (500 mg total) by mouth 3 (three) times daily as needed. 09/17/12   Verlee Rossetti, MD   Physical Exam: Filed Vitals:   09/19/12 2342 09/20/12 0234  BP: 160/59   Pulse: 93   Temp: 98.1 F (36.7 C)   TempSrc: Oral   Resp:  16   Height:  5\' 5"  (1.651 m)  Weight:  68.04 kg (150 lb)  SpO2: 94%     General:  NAD, resting comfortably in bed Eyes: PEERLA EOMI ENT: mucous membranes moist Neck: supple w/o JVD Cardiovascular: RRR w/o MRG Respiratory: CTA B Abdomen: soft, nt, nd, bs+ Skin: venous stasis changes BLE, bruising RUE below the surgical site Musculoskeletal: able to wiggle toes and has present DP pulse on LLE, the ankle is grossly deformed and obviously fractured with dislocation of bones on physical exam Psychiatric: normal tone and affect Neurologic: AAOx3, grossly non-focal  Labs on Admission:  Basic Metabolic Panel:  Recent Labs Lab 09/15/12 0859 09/18/12 0555 09/20/12 0117  NA 141 139 135  K 4.6 4.3 4.2  CL 103 102 99  CO2 29 24 26   GLUCOSE 122* 167* 187*  BUN 33* 20 22  CREATININE 0.74 0.77 0.82  CALCIUM 10.0 9.2 8.8   Liver Function Tests: No results found for this basename: AST, ALT, ALKPHOS, BILITOT, PROT, ALBUMIN,  in the last 168 hours No results found for this basename: LIPASE, AMYLASE,  in the last 168 hours No results found for this basename: AMMONIA,  in the last 168 hours CBC:  Recent Labs Lab 09/15/12 0859 09/18/12 0555 09/20/12 0117  WBC 9.8  --  16.0*  HGB 13.4 12.4* 11.7*  HCT 39.6 37.2* 35.4*  MCV 84.8  --  87.8  PLT 233  --  194   Cardiac Enzymes: No results found for this basename: CKTOTAL, CKMB, CKMBINDEX, TROPONINI,  in the last 168 hours  BNP (last 3 results) No results found for this basename: PROBNP,  in the last 8760 hours CBG:  Recent Labs Lab 09/17/12 1204 09/17/12 1731  GLUCAP 118* 141*    Radiological Exams on Admission: Dg Ankle Complete Right  09/20/2012  *RADIOLOGY REPORT*  Clinical Data: Ankle pain and deformity post fall  RIGHT ANKLE - COMPLETE 3+ VIEW  Comparison: None  Findings: Marked osseous demineralization. Medial and lateral malleolar fractures with lateral displacement. Marked lateral subluxation of the talus and ankle  joint. Posterior margin of distal tibia appears grossly intact. Visualized tarsals intact.  IMPRESSION: Bimalleolar fractures right ankle with significant lateral displacement of fracture fragments and lateral subluxation of talus.   Original Report Authenticated By: Ulyses Southward, M.D.    Dg Knee Complete 4 Views Right  09/20/2012  *RADIOLOGY REPORT*  Clinical Data:  Knee pain and swelling post fall  RIGHT KNEE - COMPLETE 4+ VIEW  Comparison: None  Findings: Osseous demineralization. Diffuse joint space narrowing. No acute fracture, dislocation, or bone destruction. Scattered atherosclerotic calcification. No definite knee joint effusion.  IMPRESSION: No definite acute osseous abnormalities.   Original Report Authenticated By: Ulyses Southward, M.D.  EKG: Independently reviewed.  Assessment/Plan Principal Problem:   Closed bimalleolar fracture of left ankle   1. Closed bimalleolar fracture left ankle - Dr. Rulon Abide attempting manual reduction and splinting at this time in ED, admitting patient to hospital, orthopaedic surgeon consulted and coming to see patient.  Patient NPO and holding off on starting heparin  dvt ppx in anticipation of surgical intervention later today.  Medically the patient is cleared for surgery, at baseline he is able to walk 3 blocks up hill and does not become short of breath and has no chest pain.  Also the patient just had surgery (and clearance) 2 days ago for shoulder replacement.  Neurovascularly intact at this time, neurovascular checks ordered Q4H.  Bed rest for the moment. 2. HTN - continue home meds 3. DM2 - diet controlled at home, CBG checks Q6H for now while inpatient.  Ortho Dr. Shelle Iron consulted and aware of patient, holding heparin, and patient NPO in prep for possible ORIF.  Code Status: Full Code (must indicate code status--if unknown or must be presumed, indicate so) Family Communication: Spoke with family at bedside (indicate person spoken with, if applicable,  with phone number if by telephone) Disposition Plan: Admit to inpatient (indicate anticipated LOS)  Time spent: 70 min  GARDNER, JARED M. [facility] Pager [phone] If 7PM-7AM, please contact night-coverage [email or web address] Password [email or web address] 09/20/2012, 4:08 AM

## 2012-09-20 NOTE — ED Provider Notes (Signed)
History     CSN: [id number]  Arrival date & time 09/19/12  2335   First MD Initiated Contact with Patient 09/20/12 0113      Chief Complaint  Patient presents with  . Ankle Pain    (Consider location/radiation/quality/duration/timing/severity/associated sxs/prior treatment) HPI Logan Bean is a 79 y.o. male presents to the ER with ankle injury after a twisting motion and fall produced severe right ankle pain, worse on ankle or foot movement or palpation.  Pt recently was DC from the hospital 3/9 after right shoulder reverse total shoulder arthroplasty on 3/7 w/ Dr. Ranell Patrick.  Denies numbness, tingling, coldness in affected foot.  Right should post op has been having post-operative pain, typically mostly relieved with pain medicine.  Patient took some medicine from his surgery which has helped his ankle pain.    Past Medical History  Diagnosis Date  . Hypertension   . Hypercholesteremia   . Stomach ulcer   . H/O hiatal hernia   . Varicose veins     both legs  . Arthritis     left knee  . Diabetes mellitus     diet controlled, pt does not check cbg  . Left rotator cuff tear Jul 12, 2012    Past Surgical History  Procedure Laterality Date  . Esopagus strectching  2003 and 3 yrs ago  . Tonsillectomy  age 88  . Shoulder open rotator cuff repair  11/05/2011    Procedure: ROTATOR CUFF REPAIR SHOULDER OPEN;  Surgeon: Jacki Cones, MD;  Location: [facility];  Service: Orthopedics;  Laterality: Left;  Left Shoulder Rotator Cuff Repair Open with Graft and Anchors    Family History  Problem Relation Age of Onset  . Hip fracture Mother     History  Substance Use Topics  . Smoking status: Former Smoker -- 50 years    Types: Pipe, Cigarettes    Quit date: 07/14/1996  . Smokeless tobacco: Never Used  . Alcohol Use: Yes     Comment: occasional      Review of Systems At least 10pt or greater review of systems completed and are negative except where specified in the  HPI.  Allergies  Morphine and related and Oxycodone  Home Medications   Current Outpatient Rx  Name  Route  Sig  Dispense  Refill  . acetaminophen (TYLENOL) 650 MG CR tablet   Oral   Take 650 mg by mouth daily as needed (joint pain).         Marland Kitchen amLODipine (NORVASC) 5 MG tablet   Oral   Take 5 mg by mouth at bedtime.          Marland Kitchen aspirin EC 81 MG tablet   Oral   Take 81 mg by mouth at bedtime.          Marland Kitchen HYDROcodone-acetaminophen (NORCO) 5-325 MG per tablet   Oral   Take 1 tablet by mouth every 6 (six) hours as needed for pain.   60 tablet   0   . lisinopril (PRINIVIL,ZESTRIL) 20 MG tablet   Oral   Take 20 mg by mouth at bedtime.          . Multiple Vitamin (MULITIVITAMIN WITH MINERALS) TABS   Oral   Take 1 tablet by mouth daily.         . Omega-3 Fatty Acids (FISH OIL) 1200 MG CAPS   Oral   Take 1,200 mg by mouth daily.         Marland Kitchen omeprazole (  PRILOSEC) 20 MG capsule   Oral   Take 20 mg by mouth daily before breakfast.         . OVER THE COUNTER MEDICATION   Oral   Take 2 tablets by mouth daily. Glucosamine + chrondroitin 1500/1200mg          . simvastatin (ZOCOR) 20 MG tablet   Oral   Take 20 mg by mouth at bedtime.          . methocarbamol (ROBAXIN) 500 MG tablet   Oral   Take 1 tablet (500 mg total) by mouth 3 (three) times daily as needed.   60 tablet   1     BP 148/57  Pulse 89  Temp(Src) 98.1 F (36.7 C) (Oral)  Resp 22  Ht 5\' 5"  (1.651 m)  Wt 150 lb (68.04 kg)  BMI 24.96 kg/m2  SpO2 98%  Physical Exam  Nursing notes reviewed.  Electronic medical record reviewed. VITAL SIGNS:   Filed Vitals:   09/20/12 2330 09/21/12 0029 09/21/12 0219 09/21/12 0452  BP: 101/66 127/72 171/74 95/56  Pulse: 83 87 95 83  Temp: 98.5 F (36.9 C)  99.3 F (37.4 C) 99.3 F (37.4 C)  TempSrc:      Resp: 18 18 22 20   Height:      Weight:      SpO2: 98% 96% 96% 95%   CONSTITUTIONAL: Awake, oriented, appears non-toxic but  uncomfortable. HENT: Atraumatic, normocephalic, oral mucosa pink and moist, airway patent. Nares patent without drainage. External ears normal. EYES: Conjunctiva clear, EOMI, PERRLA NECK: Trachea midline, non-tender, supple CARDIOVASCULAR: Normal heart rate, Normal rhythm, No murmurs, rubs, gallops PULMONARY/CHEST: Clear to auscultation, no rhonchi, wheezes, or rales. Symmetrical breath sounds. Non-tender. ABDOMINAL: Non-distended, soft, non-tender - no rebound or guarding.  BS normal. NEUROLOGIC: Non-focal, moving all four extremities, no gross sensory or motor deficits. EXTREMITIES: No clubbing, cyanosis, or edema.  Right ankle deformity, foot not aligned with tibia, DP/PT pulses 2+ and easily palpable, cap refill <3 sec in all toes, pt can dorsiflex/plantar flex all toes and ankle.  ROM limited 2/2 to pain.  Calf/foot compartments soft. SKIN: Warm, Dry, No erythema, No rash  ED Course  Procedural sedation Date/Time: 09/21/2012 7:22 AM Performed by: Jones Skene Authorized by: Jones Skene Consent: written consent obtained. Consent given by: patient Patient identity confirmed: verbally with patient and arm band Time out: Immediately prior to procedure a "time out" was called to verify the correct patient, procedure, equipment, support staff and site/side marked as required. Local anesthesia used: no Patient sedated: yes Sedation type: moderate (conscious) sedation Sedatives: ketamine and propofol Analgesia: ketamine Vitals: Vital signs were monitored during sedation. Patient tolerance: Patient tolerated the procedure well with no immediate complications. Comments: See nursing record for VS details, no airway intervention was needed, no apnea.  Reduction of dislocation Performed by: Jones Skene Authorized by: Jones Skene Consent: Verbal consent obtained. written consent obtained. Consent given by: patient Patient identity confirmed: verbally with patient Patient  tolerance: Patient tolerated the procedure well with no immediate complications. Comments: Post-reduction films showed good approximation of bone fragments s/p reduction.  Pt tolerated well. Distally NVI after splint, good cap refill and intact sensation. Able to wiggle toes. Ankle mobility limited by splint.   (including critical care time)  Labs Reviewed  CBC - Abnormal; Notable for the following:    WBC 16.0 (*)    RBC 4.03 (*)    Hemoglobin 11.7 (*)    HCT 35.4 (*)  All other components within normal limits  BASIC METABOLIC PANEL - Abnormal; Notable for the following:    Glucose, Bld 187 (*)    GFR calc non Af Amer 76 (*)    GFR calc Af Amer 88 (*)    All other components within normal limits  CBC - Abnormal; Notable for the following:    WBC 13.3 (*)    RBC 3.71 (*)    Hemoglobin 10.6 (*)    HCT 32.5 (*)    All other components within normal limits  BASIC METABOLIC PANEL - Abnormal; Notable for the following:    Glucose, Bld 143 (*)    Calcium 8.1 (*)    GFR calc non Af Amer 82 (*)    All other components within normal limits  GLUCOSE, CAPILLARY - Abnormal; Notable for the following:    Glucose-Capillary 135 (*)    All other components within normal limits  COMPREHENSIVE METABOLIC PANEL - Abnormal; Notable for the following:    Glucose, Bld 201 (*)    Calcium 7.6 (*)    Total Protein 4.6 (*)    Albumin 1.9 (*)    GFR calc non Af Amer 76 (*)    GFR calc Af Amer 88 (*)    All other components within normal limits  GLUCOSE, CAPILLARY - Abnormal; Notable for the following:    Glucose-Capillary 181 (*)    All other components within normal limits  GLUCOSE, CAPILLARY - Abnormal; Notable for the following:    Glucose-Capillary 131 (*)    All other components within normal limits   Dg Ankle Complete Right  09/20/2012  *RADIOLOGY REPORT*  Clinical Data: Ankle pain and deformity post fall  RIGHT ANKLE - COMPLETE 3+ VIEW  Comparison: None  Findings: Marked osseous  demineralization. Medial and lateral malleolar fractures with lateral displacement. Marked lateral subluxation of the talus and ankle joint. Posterior margin of distal tibia appears grossly intact. Visualized tarsals intact.  IMPRESSION: Bimalleolar fractures right ankle with significant lateral displacement of fracture fragments and lateral subluxation of talus.   Original Report Authenticated By: Ulyses Southward, M.D.    Dg Knee Complete 4 Views Right  09/20/2012  *RADIOLOGY REPORT*  Clinical Data:  Knee pain and swelling post fall  RIGHT KNEE - COMPLETE 4+ VIEW  Comparison: None  Findings: Osseous demineralization. Diffuse joint space narrowing. No acute fracture, dislocation, or bone destruction. Scattered atherosclerotic calcification. No definite knee joint effusion.  IMPRESSION: No definite acute osseous abnormalities.   Original Report Authenticated By: Ulyses Southward, M.D.      1. Closed bimalleolar fracture of left ankle, initial encounter       MDM  Logan Bean is a 79 y.o. male presents with closed bimalleolar Fx of right foot.  No compartment syndrome suspected.  NVI distally.  Discussed closed reduction with moderate sedation with patient who agrees and consents to procedure with ketofol.  Pt was recently anesthetized with shoulder surgery, minimal comorbidities, good sedation candidate in ER despite advanced age.  Post reduction films show good reapproximation of ankle bones.  Pt d/w Dr. Julian Reil of [facility] for admission and Dr. Shelle Iron for ORIF of ankle.  Pt tolerated procedure well and recovered fully from ketofol.  Admitted stable.        Jones Skene, MD 09/21/12 2440

## 2012-09-20 NOTE — ED Notes (Addendum)
Additional 20 mg (2ml) of Propofol given.

## 2012-09-20 NOTE — Progress Notes (Signed)
Patient evaluated earlier this AM by my associate.  Plans are outlined in H and P please review for details.  Ortho on board and have plans for ORIF. Pt is currently NPO.  Will reassess next am.  Penny Pia

## 2012-09-20 NOTE — Interval H&P Note (Signed)
History and Physical Interval Note:  09/20/2012 4:57 PM  Logan Bean  has presented today for surgery, with the diagnosis of left ankle fracture  The various methods of treatment have been discussed with the patient and family. After consideration of risks, benefits and other options for treatment, the patient has consented to  Procedure(s): OPEN REDUCTION INTERNAL FIXATION (ORIF) ANKLE FRACTURE (Left) as a surgical intervention .  The patient's history has been reviewed, patient examined, no change in status, stable for surgery.  I have reviewed the patient's chart and labs.  Questions were answered to the patient's satisfaction.     [name],[name]

## 2012-09-20 NOTE — Anesthesia Postprocedure Evaluation (Signed)
Anesthesia Post Note  Patient: Logan Bean  Procedure(s) Performed: Procedure(s) (LRB): OPEN REDUCTION INTERNAL FIXATION (ORIF) ANKLE FRACTURE (Right)  Anesthesia type: General  Patient location: PACU  Post pain: Pain level controlled  Post assessment: Post-op Vital signs reviewed  Last Vitals:  Filed Vitals:   09/20/12 2000  BP: 117/41  Pulse: 87  Temp: 37.3 C  Resp: 15    Post vital signs: Reviewed  Level of consciousness: sedated  Complications: No apparent anesthesia complications

## 2012-09-20 NOTE — ED Notes (Addendum)
Additional 2 ml of Ketamine and Propofol mixed together given.

## 2012-09-20 NOTE — ED Notes (Signed)
Phlebotomy at bedside.

## 2012-09-20 NOTE — ED Notes (Addendum)
4ml of Ketamine and Propofol mixed together. I put 20mg  in the scanned administration, but this ed note is NOT an added administration. I am just clarifying that 4 ml of Ketamine and propofol mixed together was administered which is 20 mg of Propofol + 20 mg of Ketamine at 0433.

## 2012-09-21 ENCOUNTER — Encounter (HOSPITAL_COMMUNITY): Payer: Self-pay | Admitting: Specialist

## 2012-09-21 DIAGNOSIS — I10 Essential (primary) hypertension: Secondary | ICD-10-CM

## 2012-09-21 DIAGNOSIS — E119 Type 2 diabetes mellitus without complications: Secondary | ICD-10-CM

## 2012-09-21 LAB — GLUCOSE, CAPILLARY
Glucose-Capillary: 131 mg/dL — ABNORMAL HIGH (ref 70–99)
Glucose-Capillary: 181 mg/dL — ABNORMAL HIGH (ref 70–99)
Glucose-Capillary: 185 mg/dL — ABNORMAL HIGH (ref 70–99)

## 2012-09-21 LAB — COMPREHENSIVE METABOLIC PANEL
AST: 15 U/L (ref 0–37)
BUN: 22 mg/dL (ref 6–23)
CO2: 22 mEq/L (ref 19–32)
Calcium: 7.6 mg/dL — ABNORMAL LOW (ref 8.4–10.5)
Creatinine, Ser: 0.8 mg/dL (ref 0.50–1.35)
GFR calc non Af Amer: 76 mL/min — ABNORMAL LOW (ref >90)

## 2012-09-21 MED ORDER — HYDROCODONE-ACETAMINOPHEN 5-325 MG PO TABS: 1.0000 | ORAL_TABLET | Freq: Four times a day (QID) | ORAL | Status: DC | PRN

## 2012-09-21 NOTE — Progress Notes (Signed)
Clinical Social Work Department BRIEF PSYCHOSOCIAL ASSESSMENT 09/21/2012  Patient:  Logan Bean,Logan Bean     Account Number:  [id number]     Admit date:  09/19/2012  Clinical Social Worker:  Jacelyn Grip  Date/Time:  09/21/2012 02:00 PM  Referred by:  Physician  Date Referred:  09/21/2012 Referred for  SNF Placement   Other Referral:   Interview type:  Patient Other interview type:   and patient friend, Sheralyn Boatman    PSYCHOSOCIAL DATA Living Status:  ALONE Admitted from facility:   Level of care:   Primary support name:  [name] Brogan/friend/[phone] Primary support relationship to patient:  FRIEND Degree of support available:   strong    CURRENT CONCERNS Current Concerns  Post-Acute Placement   Other Concerns:    SOCIAL WORK ASSESSMENT / PLAN CSW received referral that pt needing rehab at [facility].    CSW met with pt and pt friend, [name] at bedside. Pt reports that he lives in a apartment off of his friend, Lynn's home. Pt discussed how he recently had shoulder replacement surgery and d/c home, then feel and fractured his ankle. Pt and pt friend feel that short term rehab at SNF will be beneficial to pt at this time. CSW discussed process of SNF placement and pt agreeable to SNF search in [facility].    CSW completed FL2 and initiated SNF search.    CSW to follow up with pt and pt friend, [name] re: bed offers.    CSW to continue to follow and facilitate pt discharge needs when pt medically stable for discharge.   Assessment/plan status:  Psychosocial Support/Ongoing Assessment of Needs Other assessment/ plan:   discharge planning   Information/referral to community resources:   [facility] list    PATIENT'S/FAMILY'S RESPONSE TO PLAN OF CARE: Pt alert and oriented x 4. Pt agreeable to rehab at SNF. Pt friend plans to visit facilities and assist pt with decision about rehab facility.     Jacklynn Lewis, MSW, LCSWA  Clinical Social Work [phone]

## 2012-09-21 NOTE — Evaluation (Signed)
Physical Therapy Evaluation Patient Details Name: Logan Bean MRN: [id number] DOB: [birth year] Today's Date: 09/21/2012 Time: 1191-4782 PT Time Calculation (min): 72 min  PT Assessment / Plan / Recommendation Clinical Impression  Pt s/p R ankle fx and recent L shoulder replacement presents with inability to utilize either R extremity,  and limitations in use of both L extremities in addition to pain severely limiting functional mobility    PT Assessment  Patient needs continued PT services    Follow Up Recommendations  SNF    Does the patient have the potential to tolerate intense rehabilitation      Barriers to Discharge        Equipment Recommendations  None recommended by PT    Recommendations for Other Services OT consult   Frequency Min 3X/week    Precautions / Restrictions Precautions Precautions: Fall;Shoulder Required Braces or Orthoses: Other Brace/Splint Other Brace/Splint: R UE in sling Restrictions Weight Bearing Restrictions: Yes RLE Weight Bearing: Touchdown weight bearing Other Position/Activity Restrictions: bed to chair transfers only   Pertinent Vitals/Pain Min c/o pain at rest.  Increased R ankle and R shoulder pain with movement.      Mobility  Bed Mobility Bed Mobility: Supine to Sit Supine to Sit: 1: +2 Total assist Supine to Sit: Patient Percentage: 20% Details for Bed Mobility Assistance: utilized pad to assist pt to side of bed Transfers Transfers: Sit to Stand;Stand to Sit Sit to Stand: 1: +2 Total assist Sit to Stand: Patient Percentage: 40% Stand to Sit: 1: +2 Total assist Stand to Sit: Patient Percentage: 40% Details for Transfer Assistance: utilized bed to assist with move to standing on L LE - pt achieved standing x 30 sec before becoming incontinent of bowel and returned to sitting Ambulation/Gait Ambulation/Gait Assistance: Not tested (comment)    Exercises     PT Diagnosis: Difficulty walking  PT Problem List: Decreased  strength;Decreased range of motion;Decreased activity tolerance;Decreased balance;Decreased mobility;Pain PT Treatment Interventions: DME instruction;Functional mobility training;Therapeutic activities;Therapeutic exercise;Balance training;Patient/family education   PT Goals Acute Rehab PT Goals PT Goal Formulation: With patient Time For Goal Achievement: 09/24/12 Potential to Achieve Goals: Fair Pt will go Supine/Side to Sit: with mod assist PT Goal: Supine/Side to Sit - Progress: Goal set today Pt will Sit at Edge of Bed: with supervision;6-10 min;with unilateral upper extremity support PT Goal: Sit at Edge Of Bed - Progress: Goal set today Pt will go Sit to Supine/Side: with mod assist PT Goal: Sit to Supine/Side - Progress: Goal set today Pt will go Sit to Stand: with +2 total assist;from elevated surface PT Goal: Sit to Stand - Progress: Goal set today Pt will go Stand to Sit: with +2 total assist PT Goal: Stand to Sit - Progress: Goal set today Pt will Transfer Bed to Chair/Chair to Bed: with +2 total assist PT Transfer Goal: Bed to Chair/Chair to Bed - Progress: Goal set today  Visit Information  Last PT Received On: 09/21/12 Assistance Needed: +2    Subjective Data  Subjective: I have a broken R ankle, a bad L knee, a shoulder replacement last week on the R and a bad L shoulder Patient Stated Goal: Rehab and then home   Prior Functioning  Home Living Lives With: Family Available Help at Discharge: Family Type of Home: House Home Access: Stairs to enter Secretary/administrator of Steps: 1 Home Layout: One level Bathroom Shower/Tub: Health visitor: Standard Home Adaptive Equipment: Straight cane;Shower chair without back Prior Function Level of Independence: Independent  Able to Take Stairs?: Yes Communication Communication: No difficulties Dominant Hand: Right    Cognition  Cognition Overall Cognitive Status: Appears within functional limits for  tasks assessed/performed Arousal/Alertness: Awake/alert Orientation Level: Appears intact for tasks assessed Behavior During Session: [facility] for tasks performed    Extremity/Trunk Assessment Right Upper Extremity Assessment RUE ROM/Strength/Tone: Deficits RUE ROM/Strength/Tone Deficits: arm in sling - not assessed Left Upper Extremity Assessment LUE ROM/Strength/Tone: Deficits LUE ROM/Strength/Tone Deficits: 4- hand and elbow; shoulder 2+ with 90 elevation achieved only with assist Right Lower Extremity Assessment RLE ROM/Strength/Tone: Deficits RLE ROM/Strength/Tone Deficits: ankle immobilized  Left Lower Extremity Assessment LLE ROM/Strength/Tone: Deficits LLE ROM/Strength/Tone Deficits: 4-/5 with AAROm at knee to 90   Balance Balance Balance Assessed: Yes Static Sitting Balance Static Sitting - Balance Support: Left upper extremity supported Static Sitting - Level of Assistance: 5: Stand by assistance;3: Mod assist;4: Min assist (Pt with intermittant post lean ) Static Sitting - Comment/# of Minutes: 10  End of Session PT - End of Session Equipment Utilized During Treatment: Gait belt Activity Tolerance: Patient tolerated treatment well Patient left: in bed;with call bell/phone within reach;with family/visitor present Nurse Communication: Mobility status;Need for lift equipment;Weight bearing status;Other (comment) Secondary to pt condition and ongoing bowel incontinence, utilized mechanical lift to transfer pt bed<>BSC.**  GP Functional Assessment Tool Used: Clinical judgement Functional Limitation: Mobility: Walking and moving around;Changing and maintaining body position Mobility: Walking and Moving Around Current Status [phone]): 100 percent impaired, limited or restricted Mobility: Walking and Moving Around Goal Status [phone]): At least 80 percent but less than 100 percent impaired, limited or restricted   Tal Neer 09/21/2012, 1:52 PM

## 2012-09-21 NOTE — Progress Notes (Signed)
Visited with patient. She had been a patient at [facility] for shoulder surgery, had gone home and fell and broke her ankle. She seemed a bit discouraged. Offered her encouragement and indicated that she was in a good place with supportive nurses and doctors who could give her good care. Indicated that she would be in my thoughts and prayers.

## 2012-09-21 NOTE — Progress Notes (Signed)
[facility] PROGRESS NOTE  Aquiles Caswell Corwin ZOX:[id number] DOB: 1920-09-30 DOA: 09/19/2012 PCP: No primary provider on file.  Assessment/Plan: 1. Ankle fx - pt is s/p ORIF - ortho managing and have made recommendations on dvt prophylaxis - recommending SNF given history and weight bearing status - ortho recommends aspirin 325 for dvt prophylaxis. - placed order for social worker for SNF  2. DM 2 - reportedly diet controlled at home. As such will place on diabetic diet - nurse to call for SSI if blood sugars 140 or above  3. HTN - stable on home regimen.   Code Status: full Family Communication: POA present in room  Disposition Plan: SNF once bed available   Consultants:  Ortho : [name]  Procedures:  ORIF  Antibiotics:  none   HPI/Subjective: No new complaints. No acute issues overnight.  Objective: Filed Vitals:   09/21/12 0219 09/21/12 0452 09/21/12 1038 09/21/12 1417  BP: 171/74 95/56 113/63 124/66  Pulse: 95 83 85 62  Temp: 99.3 F (37.4 C) 99.3 F (37.4 C) 98.6 F (37 C) 98.5 F (36.9 C)  TempSrc:   Oral Oral  Resp: 22 20 18 18   Height:      Weight:      SpO2: 96% 95% 95% 92%    Intake/Output Summary (Last 24 hours) at 09/21/12 1748 Last data filed at 09/21/12 1418  Gross per 24 hour  Intake   1779 ml  Output      0 ml  Net   1779 ml   Filed Weights   09/20/12 0234  Weight: 68.04 kg (150 lb)    Exam:   General:  Pt in nad, alert and awake  Cardiovascular: RRR, No mrg  Respiratory: CTA BL, no wheezes  Abdomen: soft, NT  Musculoskeletal: right arm in sling Right ankle in gauze and bandages   Data Reviewed: Basic Metabolic Panel:  Recent Labs Lab 09/15/12 0859 09/18/12 0555 09/20/12 0117 09/20/12 0800 09/21/12 0440  NA 141 139 135 138 137  K 4.6 4.3 4.2 4.0 4.2  CL 103 102 99 103 104  CO2 29 24 26 26 22   GLUCOSE 122* 167* 187* 143* 201*  BUN 33* 20 22 19 22   CREATININE 0.74 0.77 0.82 0.67 0.80  CALCIUM 10.0 9.2 8.8  8.1* 7.6*   Liver Function Tests:  Recent Labs Lab 09/21/12 0440  AST 15  ALT 10  ALKPHOS 62  BILITOT 0.7  PROT 4.6*  ALBUMIN 1.9*   No results found for this basename: LIPASE, AMYLASE,  in the last 168 hours No results found for this basename: AMMONIA,  in the last 168 hours CBC:  Recent Labs Lab 09/15/12 0859 09/18/12 0555 09/20/12 0117 09/20/12 0800  WBC 9.8  --  16.0* 13.3*  HGB 13.4 12.4* 11.7* 10.6*  HCT 39.6 37.2* 35.4* 32.5*  MCV 84.8  --  87.8 87.6  PLT 233  --  194 174   Cardiac Enzymes: No results found for this basename: CKTOTAL, CKMB, CKMBINDEX, TROPONINI,  in the last 168 hours BNP (last 3 results) No results found for this basename: PROBNP,  in the last 8760 hours CBG:  Recent Labs Lab 09/17/12 1731 09/20/12 0704 09/20/12 1946 09/21/12 0054 09/21/12 0733  GLUCAP 141* 135* 131* 181* 186*    Recent Results (from the past 240 hour(s))  SURGICAL PCR SCREEN     Status: None   Collection Time    09/15/12  9:03 AM      Result Value Range Status  MRSA, PCR NEGATIVE  NEGATIVE Final   Staphylococcus aureus NEGATIVE  NEGATIVE Final   Comment:            The Xpert SA Assay (FDA     approved for NASAL specimens     in patients over 4 years of age),     is one component of     a comprehensive surveillance     program.  Test performance has     been validated by [employer] for patients greater     than or equal to 69 year old.     It is not intended     to diagnose infection nor to     guide or monitor treatment.     Studies: Dg Ankle 2 Views Right  2012-10-17  *RADIOLOGY REPORT*  Clinical Data: ORIF right ankle  RIGHT ANKLE - 2 VIEW  Comparison: None  Findings: Two digital C-arm fluoroscopic images submitted. Examination is interpreted postoperatively. Two cannulated screws are identified across medial malleolus. Lateral plate and five screws identified at the distal fibula. Severe osseous demineralization. Ankle mortise intact. No gross  fracture is radiographically evident on the submitted images.  IMPRESSION: Post ORIF of the medial malleolus and distal fibula.   Original Report Authenticated By: Ulyses Southward, M.D.    Dg Ankle Complete Right  09/20/2012  *RADIOLOGY REPORT*  Clinical Data: Post reduction  RIGHT ANKLE - COMPLETE 3+ VIEW  Comparison: 09/19/2012  Findings: Cast artifact obscures detailed osseous evaluation. There is improved alignment of the ankle mortise.  IMPRESSION: Post reduction radiographs show improved alignment.  Detailed osseous evaluation is degraded by overlying cast artifact.   Original Report Authenticated By: Jearld Lesch, M.D.    Dg Ankle Complete Right  09/20/2012  *RADIOLOGY REPORT*  Clinical Data: Ankle pain and deformity post fall  RIGHT ANKLE - COMPLETE 3+ VIEW  Comparison: None  Findings: Marked osseous demineralization. Medial and lateral malleolar fractures with lateral displacement. Marked lateral subluxation of the talus and ankle joint. Posterior margin of distal tibia appears grossly intact. Visualized tarsals intact.  IMPRESSION: Bimalleolar fractures right ankle with significant lateral displacement of fracture fragments and lateral subluxation of talus.   Original Report Authenticated By: Ulyses Southward, M.D.    Dg Shoulder Right Port  09/20/2012  *RADIOLOGY REPORT*  Clinical Data: Fall.  Recent is shoulder surgery.  PORTABLE RIGHT SHOULDER - 2+ VIEW  Comparison: 09/17/2012  Findings: Changes of right shoulder replacement.  No hardware or bony complicating feature.  No fracture, subluxation or dislocation.  IMPRESSION: Right shoulder replacement.  No complicating feature.   Original Report Authenticated By: Charlett Nose, M.D.    Dg Knee Complete 4 Views Right  09/20/2012  *RADIOLOGY REPORT*  Clinical Data:  Knee pain and swelling post fall  RIGHT KNEE - COMPLETE 4+ VIEW  Comparison: None  Findings: Osseous demineralization. Diffuse joint space narrowing. No acute fracture, dislocation, or bone  destruction. Scattered atherosclerotic calcification. No definite knee joint effusion.  IMPRESSION: No definite acute osseous abnormalities.   Original Report Authenticated By: Ulyses Southward, M.D.    Dg Ankle Right Port  10-17-12  *RADIOLOGY REPORT*  Clinical Data: Postop ORIF right ankle fracture  PORTABLE RIGHT ANKLE - 2 VIEW  Comparison: This preop films of 09/20/2012  Findings: Postop films show plate and screw fixation of the distal right fibular fracture.  Two screws are present for fixation of the medial malleolar fracture in good alignment and position.  The ankle joint  is unremarkable.  IMPRESSION: Fixation of medial and lateral malleolar fractures in good position alignment.   Original Report Authenticated By: Dwyane Dee, M.D.    Dg C-arm 1-60 Min-no Report  09/20/2012  CLINICAL DATA: surgery   C-ARM 1-60 MINUTES  Fluoroscopy was utilized by the requesting physician.  No radiographic  interpretation.      Scheduled Meds: . amLODipine  5 mg Oral QHS  . aspirin EC  81 mg Oral QHS  . docusate sodium  100 mg Oral BID  . enoxaparin (LOVENOX) injection  30 mg Subcutaneous Q24H  . lisinopril  20 mg Oral QHS  . multivitamin with minerals  1 tablet Oral Daily  . pantoprazole  40 mg Oral Daily  . simvastatin  20 mg Oral QHS   Continuous Infusions: . sodium chloride 75 mL/hr at 09/20/12 2232    Principal Problem:   Closed bimalleolar fracture of left ankle    Time spent: > 35 minutes    Penny Pia  [facility] Pager [phone] If 7PM-7AM, please contact night-coverage at [email or web address], password [facility] 09/21/2012, 5:48 PM  LOS: 2 days

## 2012-09-21 NOTE — Progress Notes (Signed)
Clinical Social Work Department CLINICAL SOCIAL WORK PLACEMENT NOTE 09/21/2012  Patient:  Logan Bean,Logan Bean  Account Number:  [id number] Admit date:  09/19/2012  Clinical Social Worker:  Jacelyn Grip  Date/time:  09/21/2012 02:30 PM  Clinical Social Work is seeking post-discharge placement for this patient at the following level of care:   SKILLED NURSING   (*CSW will update this form in Epic as items are completed)   09/21/2012  Patient/family provided with [facility] Department of Clinical Social Work's list of facilities offering this level of care within the geographic area requested by the patient (or if unable, by the patient's family).  09/21/2012  Patient/family informed of their freedom to choose among providers that offer the needed level of care, that participate in Medicare, Medicaid or managed care program needed by the patient, have an available bed and are willing to accept the patient.  09/21/2012  Patient/family informed of [facility]' ownership interest in [facility], as well as of the fact that they are under no obligation to receive care at this facility.  PASARR submitted to EDS on 09/21/2012 PASARR number received from [employer] on 09/21/2012  FL2 transmitted to all facilities in geographic area requested by pt/family on  09/21/2012 FL2 transmitted to all facilities within larger geographic area on   Patient informed that his/her managed care company has contracts with or will negotiate with  certain facilities, including the following:     Patient/family informed of bed offers received:   Patient chooses bed at  Physician recommends and patient chooses bed at    Patient to be transferred to  on   Patient to be transferred to facility by   The following physician request were entered in Epic:   Additional Comments:  Jacklynn Lewis, MSW, LCSWA (coverage for Cori Razor, [place] [phone]) Clinical Social Work

## 2012-09-21 NOTE — Op Note (Signed)
Bean, Logan              ACCOUNT NO.:  [id number]  MEDICAL RECORD NO.:  [id number]  LOCATION:  1615                         FACILITY:  [facility]  PHYSICIAN:  Jene Every, M.D.    DATE OF BIRTH:  07-04-1921  DATE OF PROCEDURE:  09/20/2012 DATE OF DISCHARGE:                              OPERATIVE REPORT   PREOPERATIVE DIAGNOSIS:  Unstable bimalleolar ankle fracture.  POSTOPERATIVE DIAGNOSIS:  Unstable bimalleolar ankle fracture.  PROCEDURE PERFORMED:  Open reduction and internal fixation, bimalleolar ankle fracture.  ANESTHESIA:  General.  ASSISTANT:  Lanna Poche, PA  HISTORY:  This is a 79 year old who is status post fracture, displaced bimalleolar.  It was a comminution of the distal fibula and medial malleolar fragment.  He was indicated for provisional fixation.  The patient had recently undergone a shoulder replacement.  He was unable to use the upper extremity for weightbearing.  We felt that given the unstable pattern of this fracture that internal fixation, stabilization would provide the ability to put partial weight on the side at least given the comorbidities.  Risks and benefits were discussed including bleeding, infection, DVT, PE, nonunion, malunion, and need for revision, etc.  TECHNIQUE:  With the patient in supine position, after induction of adequate general anesthesia, 2 g Kefzol, the right lower extremity was prepped and draped in usual sterile fashion.  Thigh tourniquet was inflated to 250 mmHg.  Midline incision was then made over the fibula. Subcutaneous tissue was dissected.  Electrocautery was utilized to achieve hemostasis.  We identified the fibula fracture site, he had some comminution distally __________ with small comminuted fragment posteriorly.  We felt the majority of the column was anterior in terms of being able to provide the plate as a buttress plate.  We, therefore, fixed it proximally with nonlocking screws #3.  After the  appropriate drilling depth gauge measurement, insertion of the appropriate length screws two 16 and a 14 mm were utilized.  We used a Biomet fixation. Distally, we contoured the plate for a hook to provide a lateral buttress to the comminuted portion of the distal fibula.  The syndesmosis was intact.  We then put proximal to that 2 locking screws 10 mm in length.  Excellent reduction and a buttress plate was applied. I then turned attention to medially.  I made a small incision over the medial malleolus.  We dissected down to the medial malleolar area. Perpendicular to the fracture site, we made 2 small incisions over the distal part of the medial malleolus approximately a 1.5 cm apart.  Using the parallel guide, we drilled 2 guidewires in the AP and lateral plane, checked, it was satisfactory, placed 40 and 44 partially threaded screws, one with a washer posteriorly with purchase.  Soft bone was noted bilaterally.  Wound was copiously irrigated.  AP and lateral plane anatomic reduction and fixation was noted.  Closed the subcu with 2-0 Vicryl, skin with staples.  Wound was dressed sterilely.  Tourniquet was deflated with adequate vascularization of lower extremity appreciated. Next, a short leg cast was applied.  Fiberglass was well molded in neutral position.  The patient had the following the tourniquet deflated, good pulses, good capillary refill.  The patient was then extubated without difficulty and transported to recovery room in satisfactory condition.  The patient tolerated the procedure well with no complications. Tourniquet time was 40 minutes.     Jene Every, M.D.     Logan Bean  D:  09/20/2012  T:  09/21/2012  Job:  [id number]

## 2012-09-21 NOTE — Care Management Note (Signed)
    Page 1 of 1   09/28/2012     1:27:50 PM   CARE MANAGEMENT NOTE 09/28/2012  Patient:  Logan Bean,Logan Bean   Account Number:  [id number]  Date Initiated:  09/20/2012  Documentation initiated by:  Colleen Can  Subjective/Objective Assessment:   DX LT ANKLE FRACTURE-DISPLACED     Action/Plan:   Snf rehab   Anticipated DC Date:  09/30/2012   Anticipated DC Plan:  SKILLED NURSING FACILITY  In-house referral  Clinical Social Worker      DC Planning Services  CM consult      Choice offered to / List presented to:             Status of service:  Completed, signed off Medicare Important Message given?   (If response is "NO", the following Medicare IM given date fields will be blank) Date Medicare IM given:   Date Additional Medicare IM given:    Discharge Disposition:  SKILLED NURSING FACILITY  Per UR Regulation:  Reviewed for med. necessity/level of care/duration of stay  If discussed at Long Length of Stay Meetings, dates discussed:    Comments:

## 2012-09-21 NOTE — Progress Notes (Signed)
Subjective: 1 Day Post-Op Procedure(s) (LRB): OPEN REDUCTION INTERNAL FIXATION (ORIF) ANKLE FRACTURE (Right) Patient reports pain as mild.  Reports diffuse ankle pain. No numbness or tingling. No other c/o  Objective: Vital signs in last 24 hours: Temp:  [98.3 F (36.8 C)-99.9 F (37.7 C)] 99.3 F (37.4 C) (03/11 0452) Pulse Rate:  [83-96] 83 (03/11 0452) Resp:  [14-22] 20 (03/11 0452) BP: (95-171)/(30-74) 95/56 mmHg (03/11 0452) SpO2:  [94 %-100 %] 95 % (03/11 0452)  Intake/Output from previous day: 03/10 0701 - 03/11 0700 In: 1399 [P.O.:60; I.V.:1289; IV Piggyback:50] Out: 350 [Urine:350] Intake/Output this shift:     Recent Labs  09/20/12 0117 09/20/12 0800  HGB 11.7* 10.6*    Recent Labs  09/20/12 0117 09/20/12 0800  WBC 16.0* 13.3*  RBC 4.03* 3.71*  HCT 35.4* 32.5*  PLT 194 174    Recent Labs  09/20/12 0800 09/21/12 0440  NA 138 137  K 4.0 4.2  CL 103 104  CO2 26 22  BUN 19 22  CREATININE 0.67 0.80  GLUCOSE 143* 201*  CALCIUM 8.1* 7.6*   No results found for this basename: LABPT, INR,  in the last 72 hours  Neurologically intact Neurovascular intact Sensation intact distally Intact pulses distally Dorsiflexion/Plantar flexion intact No cellulitis present Compartment soft cast clean and dry, no drainage Able to flex/extend toes Good cap refill  Assessment/Plan: 1 Day Post-Op Procedure(s) (LRB): OPEN REDUCTION INTERNAL FIXATION (ORIF) ANKLE FRACTURE (Right) Advance diet Up with therapy D/C IV fluids TTWB RLE for transfers with PT Likely D/C to SNF when stable per admitting service given recent R TSR as well as R ankle fx ASA 325mg  daily for DVT ppx upon D/C Will discuss with Dr. Shelle Iron Follow up with Dr. Shelle Iron for ankle in 2 weeks for xrays and staple removal  BISSELL, JACLYN M. 09/21/2012, 7:22 AM

## 2012-09-22 DIAGNOSIS — S8290XD Unspecified fracture of unspecified lower leg, subsequent encounter for closed fracture with routine healing: Secondary | ICD-10-CM

## 2012-09-22 DIAGNOSIS — R197 Diarrhea, unspecified: Secondary | ICD-10-CM

## 2012-09-22 LAB — COMPREHENSIVE METABOLIC PANEL
ALT: 13 U/L (ref 0–53)
BUN: 28 mg/dL — ABNORMAL HIGH (ref 6–23)
Calcium: 8 mg/dL — ABNORMAL LOW (ref 8.4–10.5)
Creatinine, Ser: 0.82 mg/dL (ref 0.50–1.35)
GFR calc Af Amer: 88 mL/min — ABNORMAL LOW (ref >90)
Glucose, Bld: 202 mg/dL — ABNORMAL HIGH (ref 70–99)
Sodium: 133 mEq/L — ABNORMAL LOW (ref 135–145)
Total Protein: 5.1 g/dL — ABNORMAL LOW (ref 6.0–8.3)

## 2012-09-22 LAB — GLUCOSE, CAPILLARY: Glucose-Capillary: 175 mg/dL — ABNORMAL HIGH (ref 70–99)

## 2012-09-22 MED ORDER — LOPERAMIDE HCL 2 MG PO CAPS
2.0000 mg | ORAL_CAPSULE | ORAL | Status: DC | PRN
  Administered 2012-09-22 – 2012-09-23 (×4): 2 mg via ORAL
  Filled 2012-09-22 (×4): qty 1

## 2012-09-22 MED ORDER — ASPIRIN EC 325 MG PO TBEC
325.0000 mg | DELAYED_RELEASE_TABLET | Freq: Every day | ORAL | Status: DC
  Administered 2012-09-22 – 2012-09-28 (×7): 325 mg via ORAL
  Filled 2012-09-22 (×8): qty 1

## 2012-09-22 NOTE — Progress Notes (Addendum)
[facility] PROGRESS NOTE  Logan Bean ZDG:[id number] DOB: 03/14/1921 DOA: 09/19/2012 PCP: No primary provider on file.  Brief narrative 79 year old male with history of diabetes mellitus, hypertension, recent right shoulder repair who came to the ED after a mechanical fall and fracture of his left ankle.  Assessment/Plan: right ankle fracture Status post ORIF. Continue pain control and DVT prophylaxis with full dose aspirin daily. Toe-touch weightbearing over right lower extremity. Recommended for ice and elevation over the past to reduce swelling. Patient to followup with Dr. Jillyn Hidden in 2 weeks.  Diabetes mellitus Diet controlled. Continue sliding scale insulin  Hypertension Continue amlodipine and lisinopril.   Diarrhea Likely in the setting of aggressive bowel regimen. No signs of infection. We'll monitor closely. Continue Imodium.  Code Status: Full code Family Communication: POA at bedside. Disposition Plan: Skilled nursing facility likely tomorrow   Consultants:  Orthopedics  Procedures:  ORIF of left ankle  Antibiotics:  None  HPI/Subjective: Complains of multiple loose bowel movements  Objective: Filed Vitals:   09/21/12 2108 09/22/12 0546 09/22/12 0900 09/22/12 1407  BP: 123/68 115/66 127/74 111/68  Pulse: 100 117 95 106  Temp: 98.4 F (36.9 C) 97.6 F (36.4 C) 97.7 F (36.5 C) 98.1 F (36.7 C)  TempSrc:    Oral  Resp: 20 22 19 18   Height:      Weight:      SpO2: 91% 92% 94% 94%    Intake/Output Summary (Last 24 hours) at 09/22/12 1440 Last data filed at 09/22/12 1409  Gross per 24 hour  Intake 2123.75 ml  Output      0 ml  Net 2123.75 ml   Filed Weights   09/20/12 0234  Weight: 68.04 kg (150 lb)    Exam:   General:  Elderly male sitting up in chair in no acute distress  HEENT: No pallor, moist oral mucosa  Cardiovascular: S1-S2 normal, no murmurs rub or gallop  Respiratory: Clear to auscultation bilaterally, no added  sounds  Abdomen: Soft, nontender, nondistended, bowel sounds present  Musculoskeletal: Warm, dressing over left ankle, right arm sling.  CNS: AAO x3  Data Reviewed: Basic Metabolic Panel:  Recent Labs Lab 09/18/12 0555 09/20/12 0117 09/20/12 0800 09/21/12 0440 09/22/12 0455  NA 139 135 138 137 133*  K 4.3 4.2 4.0 4.2 4.2  CL 102 99 103 104 102  CO2 24 26 26 22 20   GLUCOSE 167* 187* 143* 201* 202*  BUN 20 22 19 22  28*  CREATININE 0.77 0.82 0.67 0.80 0.82  CALCIUM 9.2 8.8 8.1* 7.6* 8.0*   Liver Function Tests:  Recent Labs Lab 09/21/12 0440 09/22/12 0455  AST 15 14  ALT 10 13  ALKPHOS 62 70  BILITOT 0.7 0.6  PROT 4.6* 5.1*  ALBUMIN 1.9* 1.8*   No results found for this basename: LIPASE, AMYLASE,  in the last 168 hours No results found for this basename: AMMONIA,  in the last 168 hours CBC:  Recent Labs Lab 09/18/12 0555 09/20/12 0117 09/20/12 0800  WBC  --  16.0* 13.3*  HGB 12.4* 11.7* 10.6*  HCT 37.2* 35.4* 32.5*  MCV  --  87.8 87.6  PLT  --  194 174   Cardiac Enzymes: No results found for this basename: CKTOTAL, CKMB, CKMBINDEX, TROPONINI,  in the last 168 hours BNP (last 3 results) No results found for this basename: PROBNP,  in the last 8760 hours CBG:  Recent Labs Lab 09/20/12 1946 09/21/12 0054 09/21/12 0733 09/21/12 2349 09/22/12 0727  GLUCAP  131* 181* 186* 185* 175*    Recent Results (from the past 240 hour(s))  SURGICAL PCR SCREEN     Status: None   Collection Time    09/15/12  9:03 AM      Result Value Range Status   MRSA, PCR NEGATIVE  NEGATIVE Final   Staphylococcus aureus NEGATIVE  NEGATIVE Final   Comment:            The Xpert SA Assay (FDA     approved for NASAL specimens     in patients over 75 years of age),     is one component of     a comprehensive surveillance     program.  Test performance has     been validated by [employer] for patients greater     than or equal to 44 year old.     It is not intended      to diagnose infection nor to     guide or monitor treatment.     Studies: Dg Ankle 2 Views Right  October 15, 2012  *RADIOLOGY REPORT*  Clinical Data: ORIF right ankle  RIGHT ANKLE - 2 VIEW  Comparison: None  Findings: Two digital C-arm fluoroscopic images submitted. Examination is interpreted postoperatively. Two cannulated screws are identified across medial malleolus. Lateral plate and five screws identified at the distal fibula. Severe osseous demineralization. Ankle mortise intact. No gross fracture is radiographically evident on the submitted images.  IMPRESSION: Post ORIF of the medial malleolus and distal fibula.   Original Report Authenticated By: Ulyses Southward, M.D.    Dg Ankle Right Port  10/15/12  *RADIOLOGY REPORT*  Clinical Data: Postop ORIF right ankle fracture  PORTABLE RIGHT ANKLE - 2 VIEW  Comparison: This preop films of 09/20/2012  Findings: Postop films show plate and screw fixation of the distal right fibular fracture.  Two screws are present for fixation of the medial malleolar fracture in good alignment and position.  The ankle joint is unremarkable.  IMPRESSION: Fixation of medial and lateral malleolar fractures in good position alignment.   Original Report Authenticated By: Dwyane Dee, M.D.    Dg C-arm 1-60 Min-no Report  09/20/2012  CLINICAL DATA: surgery   C-ARM 1-60 MINUTES  Fluoroscopy was utilized by the requesting physician.  No radiographic  interpretation.      Scheduled Meds: . amLODipine  5 mg Oral QHS  . aspirin EC  81 mg Oral QHS  . docusate sodium  100 mg Oral BID  . enoxaparin (LOVENOX) injection  30 mg Subcutaneous Q24H  . lisinopril  20 mg Oral QHS  . multivitamin with minerals  1 tablet Oral Daily  . pantoprazole  40 mg Oral Daily  . simvastatin  20 mg Oral QHS   Continuous Infusions: . sodium chloride 75 mL/hr at 09/22/12 0600      Time spent: 25 minutes    DHUNGEL, NISHANT  [facility] Pager [phone] If 7PM-7AM, please contact  night-coverage at [email or web address], password [email or web address] 09/22/2012, 2:40 PM  LOS: 3 days

## 2012-09-22 NOTE — Progress Notes (Addendum)
CSW assisting with d/c planning. New SNF search done this am to identify pvt rm availability. [facility] does not have a pvt rm at this time. Pt has requested that his friend , [name] , be contacted and bed offers be reviewed with her. Message left for [name] to contact CSW for assistance with d/c planning. CSW will follow to assist with SNF placement.  Cori Razor LCSW [phone]   11:00 Met with Sheralyn Boatman to provide updated bed offers. She will be touring SNF's today and will contact CSW once choice has been made.  Cori Razor LCSW [phone]

## 2012-09-22 NOTE — Progress Notes (Signed)
Subjective: 2 Days Post-Op Procedure(s) (LRB): OPEN REDUCTION INTERNAL FIXATION (ORIF) ANKLE FRACTURE (Right) Patient reports pain as mild.  Pain improved since yesterday.  Objective: Vital signs in last 24 hours: Temp:  [97.6 F (36.4 C)-98.5 F (36.9 C)] 97.7 F (36.5 C) (03/12 0900) Pulse Rate:  [62-117] 95 (03/12 0900) Resp:  [18-22] 19 (03/12 0900) BP: (115-127)/(66-74) 127/74 mmHg (03/12 0900) SpO2:  [91 %-94 %] 94 % (03/12 0900)  Intake/Output from previous day: 03/11 0701 - 03/12 0700 In: 2458.8 [P.O.:720; I.V.:1738.8] Out: -  Intake/Output this shift:     Recent Labs  09/20/12 0117 09/20/12 0800  HGB 11.7* 10.6*    Recent Labs  09/20/12 0117 09/20/12 0800  WBC 16.0* 13.3*  RBC 4.03* 3.71*  HCT 35.4* 32.5*  PLT 194 174    Recent Labs  09/21/12 0440 09/22/12 0455  NA 137 133*  K 4.2 4.2  CL 104 102  CO2 22 20  BUN 22 28*  CREATININE 0.80 0.82  GLUCOSE 201* 202*  CALCIUM 7.6* 8.0*   No results found for this basename: LABPT, INR,  in the last 72 hours  Neurologically intact Neurovascular intact Sensation intact distally Dorsiflexion/Plantar flexion intact No cellulitis present Compartment soft Able to flex and extend toes Cast clean and dry Good cap refill  Assessment/Plan: 2 Days Post-Op Procedure(s) (LRB): OPEN REDUCTION INTERNAL FIXATION (ORIF) ANKLE FRACTURE (Right) Advance diet Up with therapy Discharge to SNF when bed available per admitting service ASA 325mg  daily for DVT ppx upon D/C TTWB RLE Keep cast clean and dry, ice and elevate to reduce swelling Follow up with Dr. Shelle Iron in 2 weeks for xrays and staple removal  BISSELL, JACLYN M. 09/22/2012, 11:56 AM

## 2012-09-23 ENCOUNTER — Inpatient Hospital Stay (HOSPITAL_COMMUNITY): Payer: Medicare Other

## 2012-09-23 LAB — COMPREHENSIVE METABOLIC PANEL
ALT: 40 U/L (ref 0–53)
AST: 52 U/L — ABNORMAL HIGH (ref 0–37)
Albumin: 1.8 g/dL — ABNORMAL LOW (ref 3.5–5.2)
Alkaline Phosphatase: 92 U/L (ref 39–117)
Potassium: 4 mEq/L (ref 3.5–5.1)
Sodium: 136 mEq/L (ref 135–145)
Total Protein: 5.2 g/dL — ABNORMAL LOW (ref 6.0–8.3)

## 2012-09-23 LAB — GLUCOSE, CAPILLARY
Glucose-Capillary: 138 mg/dL — ABNORMAL HIGH (ref 70–99)
Glucose-Capillary: 168 mg/dL — ABNORMAL HIGH (ref 70–99)
Glucose-Capillary: 125 mg/dL — ABNORMAL HIGH (ref 70–99)

## 2012-09-23 LAB — LACTIC ACID, PLASMA: Lactic Acid, Venous: 1.1 mmol/L (ref 0.5–2.2)

## 2012-09-23 MED ORDER — IOHEXOL 300 MG/ML  SOLN
25.0000 mL | INTRAMUSCULAR | Status: AC
Start: 2012-09-23 — End: 2012-09-23

## 2012-09-23 MED ORDER — METRONIDAZOLE IN NACL 5-0.79 MG/ML-% IV SOLN
500.0000 mg | Freq: Three times a day (TID) | INTRAVENOUS | Status: DC
  Administered 2012-09-23 – 2012-09-24 (×3): 500 mg via INTRAVENOUS
  Filled 2012-09-23 (×4): qty 100

## 2012-09-23 MED ORDER — CIPROFLOXACIN IN D5W 400 MG/200ML IV SOLN
400.0000 mg | Freq: Two times a day (BID) | INTRAVENOUS | Status: DC
  Administered 2012-09-23 – 2012-09-24 (×2): 400 mg via INTRAVENOUS
  Filled 2012-09-23 (×3): qty 200

## 2012-09-23 MED ORDER — IOHEXOL 300 MG/ML  SOLN
100.0000 mL | Freq: Once | INTRAMUSCULAR | Status: AC | PRN
  Administered 2012-09-23: 100 mL via INTRAVENOUS

## 2012-09-23 NOTE — Progress Notes (Signed)
PT NOTE:  PT deferred last two days 2nd to ongoing diarrhea.  Will follow in am and re-attempt dependent on progress with bowel incontinence issue.

## 2012-09-23 NOTE — Progress Notes (Signed)
CSW met with patient & friend, [name] re: SNF decision. [name] had toured facilities yesterday afternoon and has decided on [facility] as a backup). CSW left message for [place] @ [facility] - anticipating discharge tomorrow.   Clinical Social Work Department CLINICAL SOCIAL WORK PLACEMENT NOTE 09/23/2012  Patient:  Bean,Logan  Account Number:  [id number] Admit date:  09/19/2012  Clinical Social Worker:  Jacelyn Grip  Date/time:  09/21/2012 02:30 PM  Clinical Social Work is seeking post-discharge placement for this patient at the following level of care:   SKILLED NURSING   (*CSW will update this form in Epic as items are completed)   09/21/2012  Patient/family provided with [facility] Department of Clinical Social Work's list of facilities offering this level of care within the geographic area requested by the patient (or if unable, by the patient's family).  09/21/2012  Patient/family informed of their freedom to choose among providers that offer the needed level of care, that participate in Medicare, Medicaid or managed care program needed by the patient, have an available bed and are willing to accept the patient.  09/21/2012  Patient/family informed of [facility]' ownership interest in [facility], as well as of the fact that they are under no obligation to receive care at this facility.  PASARR submitted to EDS on 09/21/2012 PASARR number received from EDS on 09/21/2012  FL2 transmitted to all facilities in geographic area requested by pt/family on  09/21/2012 FL2 transmitted to all facilities within larger geographic area on   Patient informed that his/her managed care company has contracts with or will negotiate with  certain facilities, including the following:     Patient/family informed of bed offers received:  09/23/2012 Patient chooses bed at [facility] Physician recommends and patient chooses bed at    Patient to be  transferred to  on   Patient to be transferred to facility by   The following physician request were entered in Epic:   Additional Comments:  Unice Bailey, LCSW [facility] Clinical Social Worker cell #: [phone]

## 2012-09-23 NOTE — Progress Notes (Addendum)
[facility] PROGRESS NOTE  Logan Bean [id number]:[id number] DOB: [birth year] DOA: 09/19/2012 PCP: No primary provider on file.  Brief narrative  79 year old male with history of diabetes mellitus, hypertension, recent right shoulder repair who came to the ED after a mechanical fall and fracture of his left ankle.   Assessment/Plan:  Right ankle fracture  Status post ORIF. Continue pain control and DVT prophylaxis with full dose aspirin daily. Toe-touch weightbearing over right lower extremity. Recommended for ice and elevation over the past to reduce swelling. Patient to followup with Dr. Jillyn Hidden in 2 weeks.   Diarrhea with abdominal discomfort Noticed since yesterday. Has been having loose bowel movement overnight as well. Tender on abdominal exam. For CT abdomen and pelvis. Check stool for C. difficile.  Diabetes mellitus  Diet controlled. Continue sliding scale insulin   Hypertension  Continue amlodipine and lisinopril.   Code Status: Full code  Family Communication: None at bedside Disposition Plan: Discharge to Skilled nursing facility given abdominal pain and diarrhea.  Consultants:  Orthopedics Procedures:  ORIF of left ankle Antibiotics:  None   HPI/Subjective:  Has a several episodes of loose bowel movements and abdominal pain.  Objective: Filed Vitals:   09/22/12 0900 09/22/12 1407 09/22/12 2148 09/23/12 0551  BP: 127/74 111/68 140/75 117/60  Pulse: 95 106 97 81  Temp: 97.7 F (36.5 C) 98.1 F (36.7 C) 97.7 F (36.5 C) 98.3 F (36.8 C)  TempSrc:  Oral Oral Oral  Resp: 19 18 17 20   Height:      Weight:      SpO2: 94% 94% 95% 93%    Intake/Output Summary (Last 24 hours) at 09/23/12 1223 Last data filed at 09/23/12 5784  Gross per 24 hour  Intake    525 ml  Output      0 ml  Net    525 ml   Filed Weights   09/20/12 0234  Weight: 68.04 kg (150 lb)    Exam:  General: Elderly male sitting up in chair in no acute distress  HEENT: No pallor, moist  oral mucosa  Cardiovascular: S1-S2 normal, no murmurs rub or gallop  Respiratory: Clear to auscultation bilaterally, no added sounds  Abdomen: Soft, tender to palpation, nondistended, bowel sounds present  Musculoskeletal: Warm, dressing over left ankle, right arm sling.  CNS: AAO x3   Data Reviewed: Basic Metabolic Panel:  Recent Labs Lab 09/20/12 0117 09/20/12 0800 09/21/12 0440 09/22/12 0455 09/23/12 0450  NA 135 138 137 133* 136  K 4.2 4.0 4.2 4.2 4.0  CL 99 103 104 102 103  CO2 26 26 22 20 25   GLUCOSE 187* 143* 201* 202* 191*  BUN 22 19 22  28* 31*  CREATININE 0.82 0.67 0.80 0.82 0.75  CALCIUM 8.8 8.1* 7.6* 8.0* 8.3*   Liver Function Tests:  Recent Labs Lab 09/21/12 0440 09/22/12 0455 09/23/12 0450  AST 15 14 52*  ALT 10 13 40  ALKPHOS 62 70 92  BILITOT 0.7 0.6 0.6  PROT 4.6* 5.1* 5.2*  ALBUMIN 1.9* 1.8* 1.8*   No results found for this basename: LIPASE, AMYLASE,  in the last 168 hours No results found for this basename: AMMONIA,  in the last 168 hours CBC:  Recent Labs Lab 09/18/12 0555 09/20/12 0117 09/20/12 0800  WBC  --  16.0* 13.3*  HGB 12.4* 11.7* 10.6*  HCT 37.2* 35.4* 32.5*  MCV  --  87.8 87.6  PLT  --  194 174   Cardiac Enzymes: No results found for this  basename: CKTOTAL, CKMB, CKMBINDEX, TROPONINI,  in the last 168 hours BNP (last 3 results) No results found for this basename: PROBNP,  in the last 8760 hours CBG:  Recent Labs Lab 09/22/12 0727 09/22/12 1758 09/22/12 2144 09/23/12 0748 09/23/12 1134  GLUCAP 175* 160* 196* 138* 168*    Recent Results (from the past 240 hour(s))  SURGICAL PCR SCREEN     Status: None   Collection Time    09/15/12  9:03 AM      Result Value Range Status   MRSA, PCR NEGATIVE  NEGATIVE Final   Staphylococcus aureus NEGATIVE  NEGATIVE Final   Comment:            The Xpert SA Assay (FDA     approved for NASAL specimens     in patients over 75 years of age),     is one component of     a  comprehensive surveillance     program.  Test performance has     been validated by [employer] for patients greater     than or equal to 70 year old.     It is not intended     to diagnose infection nor to     guide or monitor treatment.     Studies: No results found.  Scheduled Meds: . amLODipine  5 mg Oral QHS  . aspirin EC  325 mg Oral QHS  . docusate sodium  100 mg Oral BID  . enoxaparin (LOVENOX) injection  30 mg Subcutaneous Q24H  . lisinopril  20 mg Oral QHS  . multivitamin with minerals  1 tablet Oral Daily  . pantoprazole  40 mg Oral Daily  . simvastatin  20 mg Oral QHS   Continuous Infusions: . sodium chloride 75 mL/hr at 09/22/12 0600      Time spent: 25 Minutes    DHUNGEL, NISHANT  [facility] Pager [phone] If 7PM-7AM, please contact night-coverage at [email or web address], password [facility] 09/23/2012, 12:23 PM  LOS: 4 days

## 2012-09-24 DIAGNOSIS — A0472 Enterocolitis due to Clostridium difficile, not specified as recurrent: Secondary | ICD-10-CM

## 2012-09-24 LAB — BASIC METABOLIC PANEL
BUN: 29 mg/dL — ABNORMAL HIGH (ref 6–23)
Calcium: 8.1 mg/dL — ABNORMAL LOW (ref 8.4–10.5)
Chloride: 101 mEq/L (ref 96–112)
Creatinine, Ser: 0.86 mg/dL (ref 0.50–1.35)
GFR calc Af Amer: 86 mL/min — ABNORMAL LOW (ref >90)

## 2012-09-24 LAB — CBC
HCT: 31.9 % — ABNORMAL LOW (ref 39.0–52.0)
MCH: 28.7 pg (ref 26.0–34.0)
MCHC: 33.2 g/dL (ref 30.0–36.0)
MCV: 86.4 fL (ref 78.0–100.0)
RDW: 14.7 % (ref 11.5–15.5)

## 2012-09-24 LAB — GLUCOSE, CAPILLARY

## 2012-09-24 MED ORDER — METRONIDAZOLE 500 MG PO TABS: 500.0000 mg | ORAL_TABLET | Freq: Three times a day (TID) | ORAL | Status: DC

## 2012-09-24 MED ORDER — VANCOMYCIN 50 MG/ML ORAL SOLUTION
125.0000 mg | Freq: Four times a day (QID) | ORAL | Status: DC
  Administered 2012-09-24 – 2012-09-29 (×20): 125 mg via ORAL
  Filled 2012-09-24 (×24): qty 2.5

## 2012-09-24 NOTE — Progress Notes (Signed)
Physical Therapy Treatment Patient Details Name: Olson Lucarelli MRN: [id number] DOB: July 13, 1921 Today's Date: 09/24/2012 Time: 1120-1200 PT Time Calculation (min): 40 min  PT Assessment / Plan / Recommendation Comments on Treatment Session  marked improvement in activity tolerance.  Lift equip utilized 2* pt limitations and ongoing issues with bowel incontinence    Follow Up Recommendations  SNF     Does the patient have the potential to tolerate intense rehabilitation     Barriers to Discharge        Equipment Recommendations  None recommended by PT    Recommendations for Other Services OT consult  Frequency Min 3X/week   Plan Discharge plan remains appropriate    Precautions / Restrictions Precautions Precautions: Fall;Shoulder Type of Shoulder Precautions: in sling Required Braces or Orthoses: Other Brace/Splint Other Brace/Splint: R UE in sling Restrictions Weight Bearing Restrictions: Yes RLE Weight Bearing: Touchdown weight bearing Other Position/Activity Restrictions: bed to chair transfers only   Pertinent Vitals/Pain Min c/o pain    Mobility  Bed Mobility Bed Mobility: Supine to Sit Supine to Sit: 1: +2 Total assist Supine to Sit: Patient Percentage: 30% Details for Bed Mobility Assistance: utilized pad to assist pt to side of bed Transfers Transfers: Not assessed Ambulation/Gait Ambulation/Gait Assistance: Not tested (comment)    Exercises General Exercises - Lower Extremity Ankle Circles/Pumps: AROM;Left;10 reps;Supine Quad Sets: AROM;AAROM;10 reps;Supine;Both Heel Slides: AAROM;Both;10 reps;Supine Hip ABduction/ADduction: AAROM;Both;10 reps;Supine   PT Diagnosis:    PT Problem List:   PT Treatment Interventions:     PT Goals Acute Rehab PT Goals PT Goal Formulation: With patient Time For Goal Achievement: 09/24/12 Potential to Achieve Goals: Fair Pt will go Supine/Side to Sit: with mod assist PT Goal: Supine/Side to Sit - Progress:  Progressing toward goal Pt will Sit at Edge of Bed: with supervision;6-10 min;with unilateral upper extremity support PT Goal: Sit at Edge Of Bed - Progress: Progressing toward goal Pt will go Sit to Supine/Side: with mod assist PT Goal: Sit to Supine/Side - Progress: Progressing toward goal  Visit Information  Last PT Received On: 09/24/12 Assistance Needed: +2    Subjective Data  Subjective: It feels so good to move Patient Stated Goal: Rehab and then home   Cognition  Cognition Overall Cognitive Status: Appears within functional limits for tasks assessed/performed Arousal/Alertness: Awake/alert Orientation Level: Appears intact for tasks assessed Behavior During Session: [facility] for tasks performed    Balance  Balance Balance Assessed: Yes Static Sitting Balance Static Sitting - Balance Support: Left upper extremity supported;No upper extremity supported Static Sitting - Level of Assistance: 5: Stand by assistance;4: Min assist Static Sitting - Comment/# of Minutes: 8 min with S to min guard  End of Session PT - End of Session Equipment Utilized During Treatment: Gait belt Activity Tolerance: Patient tolerated treatment well Patient left: in chair;with call bell/phone within reach Nurse Communication: Mobility status;Need for lift equipment;Weight bearing status;Other (comment)   GP     BRADSHAW,HUNTER 09/24/2012, 1:38 PM

## 2012-09-24 NOTE — Progress Notes (Signed)
[facility] PROGRESS NOTE  Logan Bean Corwin JYN:[id number] DOB: 05-May-1921 DOA: 09/19/2012 PCP: No primary provider on file.  Brief narrative  79 year old male with history of diabetes mellitus, hypertension, recent right shoulder repair who came to the ED after a mechanical fall and fracture of his left ankle. Status post ORIF of the ankle done. Glucose prolonged do to C. difficile colitis  Assessment/Plan:  Right ankle fracture  Status post ORIF. Continue pain control and DVT prophylaxis with full dose aspirin daily. Toe-touch weightbearing over right lower extremity. Recommended for ice and elevation over the past to reduce swelling. Patient to followup with Dr. Jillyn Hidden in 2 weeks.   c diff colitis Patient has been having abdominal pain diffusely with persistent diarrhea since 3/12. A CT abdomen and pelvis all pain and on 3/13 showing diffuse colitis without signs of ischemia or obstruction. Stool for C. difficile positive. Now has significant leukocytosis. Remains afebrile.  started on empiric ciprofloxacin and Flagyl on 3/13 and switched to oral vancomycin given severe C. difficile colitis today. Monitor closely.  Diabetes mellitus  Diet controlled. Continue sliding scale insulin   Hypertension  Continue amlodipine and lisinopril.   Code Status: Full code  Family Communication: None at bedside   Disposition Plan: Discharge to Skilled nursing facility once diarrhea improves  Consultants:  Orthopedics Procedures:  ORIF of right ankle   Antibiotics:  IV Cipro and Flagyl (3/13--3/14)  Oral vancomycin (3/14--)  HPI/Subjective: Patient still has going diarrhea. CT abdomen and pelvis showed diffuse colitis. Continues to have diffuse abdominal pain as well.  Objective: Filed Vitals:   09/23/12 1425 09/23/12 2114 09/24/12 0617 09/24/12 1400  BP: 105/75 125/77 108/66 100/61  Pulse: 84 109 104 62  Temp: 98.1 F (36.7 C) 97.6 F (36.4 C) 97.9 F (36.6 C) 97.1 F (36.2 C)   TempSrc: Oral   Oral  Resp: 18 26 16 16   Height:      Weight:      SpO2: 95% 93% 95% 98%    Intake/Output Summary (Last 24 hours) at 09/24/12 1439 Last data filed at 09/24/12 0700  Gross per 24 hour  Intake    750 ml  Output      0 ml  Net    750 ml   Filed Weights   09/20/12 0234  Weight: 68.04 kg (150 lb)    Exam:  General: Elderly male lying in bed in no acute distress  HEENT: No pallor, moist oral mucosa  Cardiovascular: S1-S2 normal, no murmurs rub or gallop  Respiratory: Clear to auscultation bilaterally, no added sounds  Abdomen: Soft, tender to palpation diffusely, nondistended, bowel sounds present  Musculoskeletal: Warm, dressing over left ankle, right arm sling.  CNS: AAO x3   Data Reviewed: Basic Metabolic Panel:  Recent Labs Lab 09/20/12 0800 09/21/12 0440 09/22/12 0455 09/23/12 0450 09/24/12 1050  NA 138 137 133* 136 134*  K 4.0 4.2 4.2 4.0 4.2  CL 103 104 102 103 101  CO2 26 22 20 25 23   GLUCOSE 143* 201* 202* 191* 160*  BUN 19 22 28* 31* 29*  CREATININE 0.67 0.80 0.82 0.75 0.86  CALCIUM 8.1* 7.6* 8.0* 8.3* 8.1*   Liver Function Tests:  Recent Labs Lab 09/21/12 0440 09/22/12 0455 09/23/12 0450  AST 15 14 52*  ALT 10 13 40  ALKPHOS 62 70 92  BILITOT 0.7 0.6 0.6  PROT 4.6* 5.1* 5.2*  ALBUMIN 1.9* 1.8* 1.8*   No results found for this basename: LIPASE, AMYLASE,  in the  last 168 hours No results found for this basename: AMMONIA,  in the last 168 hours CBC:  Recent Labs Lab 09/18/12 0555 09/20/12 0117 09/20/12 0800 09/24/12 1050  WBC  --  16.0* 13.3* 17.6*  HGB 12.4* 11.7* 10.6* 10.6*  HCT 37.2* 35.4* 32.5* 31.9*  MCV  --  87.8 87.6 86.4  PLT  --  194 174 318   Cardiac Enzymes: No results found for this basename: CKTOTAL, CKMB, CKMBINDEX, TROPONINI,  in the last 168 hours BNP (last 3 results) No results found for this basename: PROBNP,  in the last 8760 hours CBG:  Recent Labs Lab 09/23/12 0748 09/23/12 1134  09/23/12 1703 09/23/12 2154 09/24/12 0722  GLUCAP 138* 168* 125* 114* 148*    Recent Results (from the past 240 hour(s))  SURGICAL PCR SCREEN     Status: None   Collection Time    09/15/12  9:03 AM      Result Value Range Status   MRSA, PCR NEGATIVE  NEGATIVE Final   Staphylococcus aureus NEGATIVE  NEGATIVE Final   Comment:            The Xpert SA Assay (FDA     approved for NASAL specimens     in patients over 54 years of age),     is one component of     a comprehensive surveillance     program.  Test performance has     been validated by [employer] for patients greater     than or equal to 76 year old.     It is not intended     to diagnose infection nor to     guide or monitor treatment.  CLOSTRIDIUM DIFFICILE BY PCR     Status: Abnormal   Collection Time    09/24/12  6:13 AM      Result Value Range Status   C difficile by pcr POSITIVE (*) NEGATIVE Final   Comment: CRITICAL RESULT CALLED TO, READ BACK BY AND VERIFIED WITH:     CLARKN RN 12:10 09/24/12 ([email or web address])     Studies: Ct Abdomen Pelvis W Contrast  09/23/2012  *RADIOLOGY REPORT*  Clinical Data: Diffuse abdominal pain.  Diarrhea. Recent right shoulder surgery.  CT ABDOMEN AND PELVIS WITH CONTRAST  Technique:  Multidetector CT imaging of the abdomen and pelvis was performed following the standard protocol during bolus administration of intravenous contrast.  Contrast: OMNIPAQUE IOHEXOL 300 MG/ML  SOLN  Comparison: None.  Findings: Mild linear opacity in both lung bases may be due to scarring or atelectasis.  The liver, gallbladder, pancreas, and spleen are normal in appearance.  A 2.6 cm low attenuation left adrenal mass is seen with attenuation value of 0 HU on delayed imaging, consistent with a benign adrenal adenoma.  Nodularity of right adrenal gland may be due to nodular hyperplasia or adenoma.  Bilateral renal parapelvic cysts are seen, without evidence of hydronephrosis.  Tiny right renal cysts are also  noted, without evidence of renal mass.  Diffuse colonic wall thickening is seen which is especially prominent involving the rectosigmoid colon.  Mild ascites is seen in the pelvis and perihepatic space.  Mild generalized ileus pattern also noted.  Several small enhancing posterior uterine fibroids are noted. No abscess or free intraperitoneal air identified.  IMPRESSION:  1.  Diffuse colitis, mild ileus, and ascites.  No evidence of bowel obstruction or abscess. 2.  Small uterine fibroids. 3.  Benign left adrenal adenoma. 4.  Bibasilar atelectasis versus scarring.   Original Report Authenticated By: Myles Rosenthal, M.D.     Scheduled Meds: . amLODipine  5 mg Oral QHS  . aspirin EC  325 mg Oral QHS  . docusate sodium  100 mg Oral BID  . enoxaparin (LOVENOX) injection  30 mg Subcutaneous Q24H  . lisinopril  20 mg Oral QHS  . multivitamin with minerals  1 tablet Oral Daily  . pantoprazole  40 mg Oral Daily  . simvastatin  20 mg Oral QHS  . vancomycin  125 mg Oral Q6H   Continuous Infusions: . sodium chloride 75 mL/hr at 09/22/12 0600      Time spent: 25 minutes    DHUNGEL, NISHANT  [facility] Pager [phone]. If 7PM-7AM, please contact night-coverage at [email or web address], password [facility] 09/24/2012, 2:39 PM  LOS: 5 days

## 2012-09-24 NOTE — Progress Notes (Signed)
Physical Therapy Treatment Patient Details Name: Logan Bean MRN: [id number] DOB: 04/03/1921 Today's Date: 09/24/2012 Time: 1501-1530 PT Time Calculation (min): 29 min  PT Assessment / Plan / Recommendation Comments on Treatment Session  Pt with improving sitting balance and motivation to self assist despite limitations    Follow Up Recommendations  SNF     Does the patient have the potential to tolerate intense rehabilitation     Barriers to Discharge        Equipment Recommendations  None recommended by PT    Recommendations for Other Services OT consult  Frequency Min 3X/week   Plan Discharge plan remains appropriate    Precautions / Restrictions Precautions Precautions: Fall;Shoulder Type of Shoulder Precautions: in sling Required Braces or Orthoses: Other Brace/Splint Other Brace/Splint: R UE in sling Restrictions Weight Bearing Restrictions: Yes RLE Weight Bearing: Touchdown weight bearing Other Position/Activity Restrictions: bed to chair transfers only   Pertinent Vitals/Pain     Mobility  Bed Mobility Bed Mobility: Sit to Supine Sit to Supine: 1: +2 Total assist Sit to Supine: Patient Percentage: 20% Details for Bed Mobility Assistance: Pt assisting with L LE to position in bed    Exercises     PT Diagnosis:    PT Problem List:   PT Treatment Interventions:     PT Goals Acute Rehab PT Goals PT Goal Formulation: With patient Time For Goal Achievement: 09/24/12 Potential to Achieve Goals: Fair Pt will go Supine/Side to Sit: with mod assist PT Goal: Supine/Side to Sit - Progress: Progressing toward goal Pt will Sit at Edge of Bed: with supervision;6-10 min;with unilateral upper extremity support PT Goal: Sit at Edge Of Bed - Progress: Progressing toward goal Pt will go Sit to Supine/Side: with mod assist PT Goal: Sit to Supine/Side - Progress: Progressing toward goal Pt will Transfer Bed to Chair/Chair to Bed: with +2 total assist PT Transfer  Goal: Bed to Chair/Chair to Bed - Progress: Progressing toward goal  Visit Information  Last PT Received On: 09/24/12 Assistance Needed: +2    Subjective Data  Subjective: It was so good to get out of that bed for a while Patient Stated Goal: Rehab and then home   Cognition  Cognition Overall Cognitive Status: Appears within functional limits for tasks assessed/performed Arousal/Alertness: Awake/alert Orientation Level: Appears intact for tasks assessed Behavior During Session: [facility] for tasks performed    Balance  Static Sitting Balance Static Sitting - Balance Support: Feet supported;Left upper extremity supported;No upper extremity supported Static Sitting - Level of Assistance: 5: Stand by assistance;4: Min assist  End of Session PT - End of Session Equipment Utilized During Treatment: Other (comment) (Maximove lift utilized) Activity Tolerance: Patient tolerated treatment well Patient left: in bed;with call bell/phone within reach Nurse Communication: Mobility status;Need for lift equipment;Weight bearing status;Other (comment)   GP     BRADSHAW,HUNTER 09/24/2012, 4:10 PM

## 2012-09-25 LAB — CBC
HCT: 31.2 % — ABNORMAL LOW (ref 39.0–52.0)
MCHC: 34 g/dL (ref 30.0–36.0)
RDW: 14.8 % (ref 11.5–15.5)

## 2012-09-25 LAB — BASIC METABOLIC PANEL
BUN: 32 mg/dL — ABNORMAL HIGH (ref 6–23)
Creatinine, Ser: 0.85 mg/dL (ref 0.50–1.35)
GFR calc Af Amer: 86 mL/min — ABNORMAL LOW (ref >90)
GFR calc non Af Amer: 74 mL/min — ABNORMAL LOW (ref >90)
Potassium: 3.6 mEq/L (ref 3.5–5.1)

## 2012-09-25 MED ORDER — SACCHAROMYCES BOULARDII 250 MG PO CAPS
250.0000 mg | ORAL_CAPSULE | Freq: Two times a day (BID) | ORAL | Status: DC
  Administered 2012-09-25 – 2012-09-29 (×9): 250 mg via ORAL
  Filled 2012-09-25 (×10): qty 1

## 2012-09-25 MED ORDER — ALUM & MAG HYDROXIDE-SIMETH 200-200-20 MG/5ML PO SUSP
15.0000 mL | Freq: Four times a day (QID) | ORAL | Status: DC | PRN
  Administered 2012-09-25: 15 mL via ORAL
  Filled 2012-09-25: qty 30

## 2012-09-25 MED ORDER — POTASSIUM CHLORIDE CRYS ER 20 MEQ PO TBCR
40.0000 meq | EXTENDED_RELEASE_TABLET | Freq: Once | ORAL | Status: AC
  Administered 2012-09-25: 40 meq via ORAL
  Filled 2012-09-25: qty 2

## 2012-09-25 NOTE — Progress Notes (Signed)
[facility] PROGRESS NOTE  Logan Bean ION:[id number] DOB: October 03, 1920 DOA: 09/19/2012 PCP: No primary provider on file.  Brief narrative  79 year old male with history of diabetes mellitus, hypertension, recent right shoulder repair who came to the ED after a mechanical fall and fracture of his left ankle. Status post ORIF of the ankle done. Hospital course prolonged due to C. difficile colitis.  Assessment/Plan:  Right ankle fracture  Status post ORIF. Continue pain control and DVT prophylaxis with full dose aspirin daily. Toe-touch weightbearing over right lower extremity. Recommended for ice and elevation over the past to reduce swelling. Patient to followup with Dr. Jillyn Hidden in 2 weeks.    c diff colitis  Patient has been having abdominal pain diffusely with persistent diarrhea since 3/12. A CT abdomen and pelvis all pain and on 3/13 showing diffuse colitis without signs of ischemia or obstruction. Stool for C. difficile positive. Now has significant leukocytosis and further worsened today.. Remains afebrile. started on empiric ciprofloxacin and Flagyl on 3/13 and switched to oral vancomycin given severe C. difficile colitis. Continues to have diarrhea and also has diffuse abdominal pain which is slightly better than yesterday. However has increased leukocytosis today. Monitor closely. If symptoms persist or worsen will get repeat CT of the abdomen tomorrow.   Diabetes mellitus  Diet controlled. Continue sliding scale insulin.  Hypertension  Continue amlodipine and lisinopril.   Code Status: Full code  Family Communication: None at bedside  Disposition Plan: Discharge to Skilled nursing facility once clinically  improved   Consultants:  Orthopedics Procedures:  ORIF of right ankle  Antibiotics:  IV Cipro and Flagyl (3/13--3/14)  Oral vancomycin (3/14--)   HPI/Subjective: Patient still continued to have diarrhea. Abdominal pain slightly improved and less distended. Remains  afebrile  Objective: Filed Vitals:   09/23/12 2114 09/24/12 0617 09/24/12 1400 09/25/12 0510  BP: 125/77 108/66 100/61 121/74  Pulse: 109 104 62 106  Temp: 97.6 F (36.4 C) 97.9 F (36.6 C) 97.1 F (36.2 C) 97.8 F (36.6 C)  TempSrc:   Oral Oral  Resp: 26 16 16 16   Height:      Weight:      SpO2: 93% 95% 98% 95%    Intake/Output Summary (Last 24 hours) at 09/25/12 1226 Last data filed at 09/25/12 1018  Gross per 24 hour  Intake   1515 ml  Output      0 ml  Net   1515 ml   Filed Weights   09/20/12 0234  Weight: 68.04 kg (150 lb)    Exam:  General: Elderly male lying in bed in no acute distress  HEENT: No pallor, moist oral mucosa  Cardiovascular: S1-S2 normal, no murmurs rub or gallop  Respiratory: Clear to auscultation bilaterally, no added sounds  Abdomen: Soft, tender to palpation diffusely, nondistended, bowel sounds present  Musculoskeletal: Warm, dressing over left ankle, right arm sling.  CNS: AAO x3   Data Reviewed: Basic Metabolic Panel:  Recent Labs Lab 09/21/12 0440 09/22/12 0455 09/23/12 0450 09/24/12 1050 09/25/12 0540  NA 137 133* 136 134* 131*  K 4.2 4.2 4.0 4.2 3.6  CL 104 102 103 101 99  CO2 22 20 25 23 21   GLUCOSE 201* 202* 191* 160* 149*  BUN 22 28* 31* 29* 32*  CREATININE 0.80 0.82 0.75 0.86 0.85  CALCIUM 7.6* 8.0* 8.3* 8.1* 8.1*   Liver Function Tests:  Recent Labs Lab 09/21/12 0440 09/22/12 0455 09/23/12 0450  AST 15 14 52*  ALT 10 13 40  ALKPHOS 62 70 92  BILITOT 0.7 0.6 0.6  PROT 4.6* 5.1* 5.2*  ALBUMIN 1.9* 1.8* 1.8*   No results found for this basename: LIPASE, AMYLASE,  in the last 168 hours No results found for this basename: AMMONIA,  in the last 168 hours CBC:  Recent Labs Lab 09/20/12 0117 09/20/12 0800 09/24/12 1050 09/25/12 0540  WBC 16.0* 13.3* 17.6* 29.5*  HGB 11.7* 10.6* 10.6* 10.6*  HCT 35.4* 32.5* 31.9* 31.2*  MCV 87.8 87.6 86.4 85.0  PLT 194 174 318 364   Cardiac Enzymes: No results found  for this basename: CKTOTAL, CKMB, CKMBINDEX, TROPONINI,  in the last 168 hours BNP (last 3 results) No results found for this basename: PROBNP,  in the last 8760 hours CBG:  Recent Labs Lab 09/23/12 1703 09/23/12 2154 09/24/12 0722 09/24/12 1703 09/25/12 1219  GLUCAP 125* 114* 148* 138* 179*    Recent Results (from the past 240 hour(s))  CLOSTRIDIUM DIFFICILE BY PCR     Status: Abnormal   Collection Time    09/24/12  6:13 AM      Result Value Range Status   C difficile by pcr POSITIVE (*) NEGATIVE Final   Comment: CRITICAL RESULT CALLED TO, READ BACK BY AND VERIFIED WITH:     CLARKN RN 12:10 09/24/12 ([email or web address])     Studies: Ct Abdomen Pelvis W Contrast  09/23/2012  *RADIOLOGY REPORT*  Clinical Data: Diffuse abdominal pain.  Diarrhea. Recent right shoulder surgery.  CT ABDOMEN AND PELVIS WITH CONTRAST  Technique:  Multidetector CT imaging of the abdomen and pelvis was performed following the standard protocol during bolus administration of intravenous contrast.  Contrast: OMNIPAQUE IOHEXOL 300 MG/ML  SOLN  Comparison: None.  Findings: Mild linear opacity in both lung bases may be due to scarring or atelectasis.  The liver, gallbladder, pancreas, and spleen are normal in appearance.  A 2.6 cm low attenuation left adrenal mass is seen with attenuation value of 0 HU on delayed imaging, consistent with a benign adrenal adenoma.  Nodularity of right adrenal gland may be due to nodular hyperplasia or adenoma.  Bilateral renal parapelvic cysts are seen, without evidence of hydronephrosis.  Tiny right renal cysts are also noted, without evidence of renal mass.  Diffuse colonic wall thickening is seen which is especially prominent involving the rectosigmoid colon.  Mild ascites is seen in the pelvis and perihepatic space.  Mild generalized ileus pattern also noted.  Several small enhancing posterior uterine fibroids are noted. No abscess or free intraperitoneal air identified.  IMPRESSION:  1.   Diffuse colitis, mild ileus, and ascites.  No evidence of bowel obstruction or abscess. 2.  Small uterine fibroids. 3.  Benign left adrenal adenoma. 4.  Bibasilar atelectasis versus scarring.   Original Report Authenticated By: Myles Rosenthal, M.D.     Scheduled Meds: . amLODipine  5 mg Oral QHS  . aspirin EC  325 mg Oral QHS  . docusate sodium  100 mg Oral BID  . enoxaparin (LOVENOX) injection  30 mg Subcutaneous Q24H  . lisinopril  20 mg Oral QHS  . multivitamin with minerals  1 tablet Oral Daily  . saccharomyces boulardii  250 mg Oral BID  . simvastatin  20 mg Oral QHS  . vancomycin  125 mg Oral Q6H   Continuous Infusions: . sodium chloride 75 mL/hr at 09/22/12 0600       Time spent: 25 minutes    Delcenia Inman  [facility] Pager [phone]. If 7PM-7AM, please contact night-coverage  at [email or web address], password [facility] 09/25/2012, 12:26 PM  LOS: 6 days

## 2012-09-25 NOTE — Progress Notes (Signed)
Physical Therapy Treatment Patient Details Name: Logan Bean MRN: [id number] DOB: 02/16/1921 Today's Date: 09/25/2012 Time: 4098-1191 PT Time Calculation (min): 33 min  PT Assessment / Plan / Recommendation Comments on Treatment Session  pt tolerated ther ex well; sling adjusted and encouraged  to move hands/elevate UEs to assisit with edema control;  pt/family informed that if bowels more under control that nursing staff could use lift to get him OOB    Follow Up Recommendations  SNF     Does the patient have the potential to tolerate intense rehabilitation     Barriers to Discharge        Equipment Recommendations  None recommended by PT    Recommendations for Other Services    Frequency Min 3X/week   Plan Discharge plan remains appropriate;Frequency remains appropriate    Precautions / Restrictions Precautions Precautions: Fall;Shoulder Type of Shoulder Precautions: sling R UE Restrictions RLE Weight Bearing: Touchdown weight bearing   Pertinent Vitals/Pain  One rest during there ex due to fatigue   Mobility       Exercises General Exercises - Upper Extremity Elbow Flexion: AAROM;Right;15 reps;AROM Elbow Extension: AAROM;AROM;15 reps;Right Digit Composite Flexion: AROM;10 reps Composite Extension: AROM;10 reps;Both General Exercises - Lower Extremity Ankle Circles/Pumps: AROM;Left;10 reps;Supine Quad Sets: AROM;Both;15 reps;Supine Short Arc Quad: AROM;AAROM;Both;15 reps;Supine Long Arc Quad: Supine Heel Slides: AROM;AAROM;Both;15 reps;Supine Hip ABduction/ADduction: AAROM;Both;15 reps;Supine Straight Leg Raises: AROM;AAROM;Left;15 reps;Supine Hand Exercises Forearm Supination: AROM;10 reps;AAROM;Right Forearm Pronation: AROM;AAROM;10 reps;Right   PT Diagnosis:    PT Problem List:   PT Treatment Interventions:     PT Goals Acute Rehab PT Goals Time For Goal Achievement: 10/09/12 Potential to Achieve Goals: Good  Visit Information  Last PT Received  On: 09/25/12 Assistance Needed: +2    Subjective Data  Subjective: this made me feel better   Cognition  Cognition Overall Cognitive Status: Appears within functional limits for tasks assessed/performed Arousal/Alertness: Awake/alert Behavior During Session: [facility] for tasks performed    Balance     End of Session PT - End of Session Activity Tolerance: Patient tolerated treatment well Patient left: in bed;with call bell/phone within reach;with family/visitor present   GP     [facility] 09/25/2012, 5:17 PM

## 2012-09-25 NOTE — Progress Notes (Signed)
Per MD, Pt not medically stable for d/c.  Providence Crosby, LCSWA Clinical Social Work [phone]

## 2012-09-26 ENCOUNTER — Inpatient Hospital Stay (HOSPITAL_COMMUNITY): Payer: Medicare Other

## 2012-09-26 LAB — CBC
MCH: 28.8 pg (ref 26.0–34.0)
MCV: 84.8 fL (ref 78.0–100.0)
Platelets: 414 10*3/uL — ABNORMAL HIGH (ref 150–400)
RDW: 14.8 % (ref 11.5–15.5)

## 2012-09-26 LAB — GLUCOSE, CAPILLARY: Glucose-Capillary: 120 mg/dL — ABNORMAL HIGH (ref 70–99)

## 2012-09-26 MED ORDER — IOHEXOL 300 MG/ML  SOLN
50.0000 mL | Freq: Once | INTRAMUSCULAR | Status: AC | PRN
  Administered 2012-09-26: 50 mL via ORAL

## 2012-09-26 MED ORDER — DEXTROSE-NACL 5-0.9 % IV SOLN
INTRAVENOUS | Status: DC
  Administered 2012-09-26: 75 mL/h via INTRAVENOUS

## 2012-09-26 NOTE — Progress Notes (Signed)
   Subjective: 6 Days Post-Op Procedure(s) (LRB): OPEN REDUCTION INTERNAL FIXATION (ORIF) ANKLE FRACTURE (Right)  Pt feeling better  Mild pain to right ankle No pain to right shoulder  Patient reports pain as mild.  Objective:   VITALS:   Filed Vitals:   09/26/12 0450  BP: 110/54  Pulse: 80  Temp: 98.9 F (37.2 C)  Resp: 20   Right shoulder incision healing well Right ankle in cast Neurologically intact bilateral upper and lower extremities  LABS  Recent Labs  09/24/12 1050 09/25/12 0540  HGB 10.6* 10.6*  HCT 31.9* 31.2*  WBC 17.6* 29.5*  PLT 318 364     Recent Labs  09/24/12 1050 09/25/12 0540  NA 134* 131*  K 4.2 3.6  BUN 29* 32*  CREATININE 0.86 0.85  GLUCOSE 160* 149*     Assessment/Plan: 6 Days Post-Op Procedure(s) (LRB): OPEN REDUCTION INTERNAL FIXATION (ORIF) ANKLE FRACTURE (Right)  Agree with SNF placement once medically stable Up with therapy F/u with Dr. Shelle Iron for ankle and Dr. Ranell Patrick for shoulder   Alphonsa Overall, MPAS, PA-C  09/26/2012, 7:19 AM

## 2012-09-26 NOTE — Progress Notes (Signed)
CT scan of the abdomen reviewed. Noted for diffuse colitis that seems to have mildly worsened with mild ileus. -Patient is nontoxic at this time. -Will keep him n.p.o. Follow labs in the morning. Check lactate. Continue current medications.

## 2012-09-26 NOTE — Progress Notes (Signed)
[facility] PROGRESS NOTE  Logan Bean NFA:[id number] DOB: 1920/12/27 DOA: 09/19/2012 PCP: No primary provider on file.  Brief narrative  79 year old male with history of diabetes mellitus, hypertension, recent right shoulder repair who came to the ED after a mechanical fall and fracture of his left ankle. Status post ORIF of the ankle done. Hospital course prolonged due to C. difficile colitis.   Assessment/Plan:  Right ankle fracture  Status post ORIF. Continue pain control and DVT prophylaxis with full dose aspirin daily. Toe-touch weightbearing over right lower extremity. Recommended for ice and elevation over the past to reduce swelling. Patient to followup with Dr. Jillyn Hidden in 2 weeks.   c diff colitis  Patient has been having abdominal pain diffusely with persistent diarrhea since 3/12. A CT abdomen and pelvis all pain and on 3/13 showing diffuse colitis without signs of ischemia or obstruction. Stool for C. difficile positive. Now has significant leukocytosis.. Remains afebrile. started on empiric ciprofloxacin and Flagyl on 3/13 and switched to oral vancomycin given severe C. difficile colitis. Continues to have diarrhea and also has diffuse abdominal pain . Patient still has persistent abdominal pain however remains afebrile in the leukocytosis likely better than yesterday. We'll recheck a CT of his abdomen to evaluate the colitis  Diabetes mellitus  Diet controlled. Continue sliding scale insulin.   Hypertension  Continue amlodipine and lisinopril.   Code Status: Full code  Family Communication: None at bedside  Disposition Plan: Discharge to Skilled nursing facility once clinically improved   Consultants:  Orthopedics Procedures:  ORIF of right ankle  Antibiotics:  IV Cipro and Flagyl (3/13--3/14)  Oral vancomycin (3/14--)   HPI/Subjective: Patient still has abdominal pain although his frequency of diarrhea seems to be improving.  Objective: Filed Vitals:   09/25/12 0510 09/25/12 1434 09/25/12 2225 09/26/12 0450  BP: 121/74 100/61 106/68 110/54  Pulse: 106  110 80  Temp: 97.8 F (36.6 C) 98.6 F (37 C) 98.2 F (36.8 C) 98.9 F (37.2 C)  TempSrc: Oral Axillary Oral Oral  Resp: 16 22 20 20   Height:      Weight:      SpO2: 95%  93% 95%    Intake/Output Summary (Last 24 hours) at 09/26/12 1436 Last data filed at 09/26/12 1244  Gross per 24 hour  Intake    600 ml  Output      1 ml  Net    599 ml   Filed Weights   09/20/12 0234  Weight: 68.04 kg (150 lb)    Exam:  General: Elderly male lying in bed in no acute distress  HEENT: No pallor, moist oral mucosa  Cardiovascular: S1-S2 normal, no murmurs rub or gallop  Respiratory: Clear to auscultation bilaterally, no added sounds  Abdomen: Soft, tender to palpation diffusely, nondistended, bowel sounds present  Musculoskeletal: Warm, dressing over left ankle, right arm sling.  CNS: AAO x3   Data Reviewed: Basic Metabolic Panel:  Recent Labs Lab 09/21/12 0440 09/22/12 0455 09/23/12 0450 09/24/12 1050 09/25/12 0540  NA 137 133* 136 134* 131*  K 4.2 4.2 4.0 4.2 3.6  CL 104 102 103 101 99  CO2 22 20 25 23 21   GLUCOSE 201* 202* 191* 160* 149*  BUN 22 28* 31* 29* 32*  CREATININE 0.80 0.82 0.75 0.86 0.85  CALCIUM 7.6* 8.0* 8.3* 8.1* 8.1*   Liver Function Tests:  Recent Labs Lab 09/21/12 0440 09/22/12 0455 09/23/12 0450  AST 15 14 52*  ALT 10 13 40  ALKPHOS 62  70 92  BILITOT 0.7 0.6 0.6  PROT 4.6* 5.1* 5.2*  ALBUMIN 1.9* 1.8* 1.8*   No results found for this basename: LIPASE, AMYLASE,  in the last 168 hours No results found for this basename: AMMONIA,  in the last 168 hours CBC:  Recent Labs Lab 09/20/12 0117 09/20/12 0800 09/24/12 1050 09/25/12 0540 09/26/12 0837  WBC 16.0* 13.3* 17.6* 29.5* 22.1*  HGB 11.7* 10.6* 10.6* 10.6* 10.4*  HCT 35.4* 32.5* 31.9* 31.2* 30.6*  MCV 87.8 87.6 86.4 85.0 84.8  PLT 194 174 318 364 414*   Cardiac Enzymes: No results  found for this basename: CKTOTAL, CKMB, CKMBINDEX, TROPONINI,  in the last 168 hours BNP (last 3 results) No results found for this basename: PROBNP,  in the last 8760 hours CBG:  Recent Labs Lab 09/24/12 0722 09/24/12 1703 09/25/12 1219 09/25/12 1737 09/26/12 1211  GLUCAP 148* 138* 179* 148* 137*    Recent Results (from the past 240 hour(s))  CLOSTRIDIUM DIFFICILE BY PCR     Status: Abnormal   Collection Time    09/24/12  6:13 AM      Result Value Range Status   C difficile by pcr POSITIVE (*) NEGATIVE Final   Comment: CRITICAL RESULT CALLED TO, READ BACK BY AND VERIFIED WITH:     CLARKN RN 12:10 09/24/12 ([email or web address])     Studies: No results found.  Scheduled Meds: . amLODipine  5 mg Oral QHS  . aspirin EC  325 mg Oral QHS  . docusate sodium  100 mg Oral BID  . enoxaparin (LOVENOX) injection  30 mg Subcutaneous Q24H  . lisinopril  20 mg Oral QHS  . multivitamin with minerals  1 tablet Oral Daily  . saccharomyces boulardii  250 mg Oral BID  . simvastatin  20 mg Oral QHS  . vancomycin  125 mg Oral Q6H   Continuous Infusions: . sodium chloride 75 mL/hr at 09/22/12 0600       Time spent: 25 minutes*    [name]  [facility] Pager [phone]. If 7PM-7AM, please contact night-coverage at [email or web address], password [facility] 09/26/2012, 2:36 PM  LOS: 7 days

## 2012-09-26 NOTE — Progress Notes (Signed)
Pt has bowel movement late this afternoon.

## 2012-09-26 NOTE — Progress Notes (Signed)
Per MD, Pt not medically ready for d/c.  Amanda Akshat Minehart, LCSWA Clinical Social Work [phone]  

## 2012-09-27 LAB — GLUCOSE, CAPILLARY: Glucose-Capillary: 155 mg/dL — ABNORMAL HIGH (ref 70–99)

## 2012-09-27 LAB — CBC
HCT: 32.4 % — ABNORMAL LOW (ref 39.0–52.0)
MCH: 28.6 pg (ref 26.0–34.0)
MCV: 89 fL (ref 78.0–100.0)
Platelets: 391 10*3/uL (ref 150–400)
RBC: 3.64 MIL/uL — ABNORMAL LOW (ref 4.22–5.81)
RDW: 15.3 % (ref 11.5–15.5)

## 2012-09-27 LAB — BASIC METABOLIC PANEL
BUN: 49 mg/dL — ABNORMAL HIGH (ref 6–23)
CO2: 17 mEq/L — ABNORMAL LOW (ref 19–32)
Calcium: 8 mg/dL — ABNORMAL LOW (ref 8.4–10.5)
Creatinine, Ser: 0.87 mg/dL (ref 0.50–1.35)

## 2012-09-27 NOTE — Progress Notes (Signed)
[facility] PROGRESS NOTE  Logan Bean DGL:[id number] DOB: 1920-10-30 DOA: 09/19/2012 PCP: No primary provider on file.  Brief narrative  79 year old male with history of diabetes mellitus, hypertension, recent right shoulder repair who came to the ED after a mechanical fall and fracture of his left ankle. Status post ORIF of the ankle done. Hospital course prolonged due to C. difficile colitis.   Assessment/Plan:  Right ankle fracture  Status post ORIF. Continue pain control and DVT prophylaxis with full dose aspirin daily. Toe-touch weightbearing over right lower extremity. Recommended for ice and elevation over the past to reduce swelling. Patient to followup with Dr. Jillyn Hidden in 2 weeks.   c diff colitis  Patient has been having abdominal pain diffusely with persistent diarrhea since 3/12. A CT abdomen and pelvis all pain and on 3/13 showing diffuse colitis without signs of ischemia or obstruction. Stool for C. difficile positive. Now has significant leukocytosis.. Remains afebrile. started on empiric ciprofloxacin and Flagyl on 3/13 and switched to oral vancomycin given severe C. difficile colitis. -CT abd repeated given persistent symptoms and worsening wbc shows worsening colitis. Made NPO. This morning is much better with wbc trending down and abdominal pain much resolved. Has had only 2 BMs since yesterday.  -will start on clears. D/c fluids  Diabetes mellitus  Diet controlled. Continue sliding scale insulin.   Hypertension  Continue amlodipine and lisinopril.   Right rotator cuff repair  has some edema over rt forearm. D/c fluids and encourage to keep arm elevated. Will ask orthopedics to evaluate.    Code Status: Full code  Family Communication: None at bedside  Disposition Plan: Discharge to Skilled nursing facility once clinically improved   Consultants:  Orthopedics   Procedures:  ORIF of right ankle    Antibiotics:  IV Cipro and Flagyl (3/13--3/14)  Oral  vancomycin (3/14--)   HPI/Subjective: Clinically much improved today.   Objective: Filed Vitals:   09/27/12 0023 09/27/12 0026 09/27/12 0527 09/27/12 1400  BP: 111/54 111/54 118/53 121/54  Pulse:   85 80  Temp:   97.7 F (36.5 C) 97.7 F (36.5 C)  TempSrc:   Oral Oral  Resp:   20 20  Height:      Weight:      SpO2:   95% 96%    Intake/Output Summary (Last 24 hours) at 09/27/12 1503 Last data filed at 09/27/12 1400  Gross per 24 hour  Intake  787.5 ml  Output      0 ml  Net  787.5 ml   Filed Weights   09/20/12 0234  Weight: 68.04 kg (150 lb)    Exam:   General: Elderly male lying in bed in no acute distress  HEENT: No pallor, moist oral mucosa  Cardiovascular: S1-S2 normal, no murmurs rub or gallop  Respiratory: Clear to auscultation bilaterally, no added sounds  Abdomen: Soft, minimal tenderness to palpation, nondistended, bowel sounds present  Musculoskeletal: Warm, dressing over left ankle, right arm sling. Edema over rt forarm CNS: AAO x3    Data Reviewed: Basic Metabolic Panel:  Recent Labs Lab 09/22/12 0455 09/23/12 0450 09/24/12 1050 09/25/12 0540 09/27/12 0402  NA 133* 136 134* 131* 131*  K 4.2 4.0 4.2 3.6 3.8  CL 102 103 101 99 102  CO2 20 25 23 21  17*  GLUCOSE 202* 191* 160* 149* 169*  BUN 28* 31* 29* 32* 49*  CREATININE 0.82 0.75 0.86 0.85 0.87  CALCIUM 8.0* 8.3* 8.1* 8.1* 8.0*   Liver Function Tests:  Recent Labs Lab 09/21/12 0440 09/22/12 0455 09/23/12 0450  AST 15 14 52*  ALT 10 13 40  ALKPHOS 62 70 92  BILITOT 0.7 0.6 0.6  PROT 4.6* 5.1* 5.2*  ALBUMIN 1.9* 1.8* 1.8*   No results found for this basename: LIPASE, AMYLASE,  in the last 168 hours No results found for this basename: AMMONIA,  in the last 168 hours CBC:  Recent Labs Lab 09/24/12 1050 09/25/12 0540 09/26/12 0837 09/27/12 0402  WBC 17.6* 29.5* 22.1* 16.4*  HGB 10.6* 10.6* 10.4* 10.4*  HCT 31.9* 31.2* 30.6* 32.4*  MCV 86.4 85.0 84.8 89.0  PLT 318 364  414* 391   Cardiac Enzymes: No results found for this basename: CKTOTAL, CKMB, CKMBINDEX, TROPONINI,  in the last 168 hours BNP (last 3 results) No results found for this basename: PROBNP,  in the last 8760 hours CBG:  Recent Labs Lab 09/26/12 1211 09/26/12 1810 09/27/12 0040 09/27/12 0536 09/27/12 1151  GLUCAP 137* 120* 163* 157* 155*    Recent Results (from the past 240 hour(s))  CLOSTRIDIUM DIFFICILE BY PCR     Status: Abnormal   Collection Time    09/24/12  6:13 AM      Result Value Range Status   C difficile by pcr POSITIVE (*) NEGATIVE Final   Comment: CRITICAL RESULT CALLED TO, READ BACK BY AND VERIFIED WITH:     CLARKN RN 12:10 09/24/12 ([email or web address])     Studies: Ct Abdomen Pelvis Wo Contrast  09/26/2012  *RADIOLOGY REPORT*  Clinical Data: Persistent abdominal pain.  Colitis.  CT ABDOMEN AND PELVIS WITHOUT CONTRAST  Technique:  Multidetector CT imaging of the abdomen and pelvis was performed following the standard protocol without intravenous contrast.  Comparison: 09/23/2012  Findings: Diffuse colitis is again seen, with mild increase in wall thickening involving the ascending transverse portion of the colon. Pericolonic soft tissue stranding is again seen as well as minimal ascites which is unchanged. There is no evidence of abscess or bowel obstruction.  High attenuation sludge or vicarious excretion of contrast is seen within the gallbladder lumen.  No evidence of gallbladder dilatation wall thickening.  Noncontrast images of the liver, spleen, pancreas, and kidneys remain unremarkable appearance on this noncontrast study.  A low attenuation left adrenal mass measuring 3 cm is unchanged and consistent with a benign adrenal adenoma.  No other soft tissue masses or lymphadenopathy identified.  IMPRESSION:  1.  Diffuse colitis, with mild worsening seen in the transverse and ascending portion of the colon. 2.  Mild ileus pattern and ascites, without significant change.  No evidence  of abscess. 3.  Gallbladder sludge versus vicariously excreted contrast. 4.  Stable small left adrenal adenoma.   Original Report Authenticated By: Myles Rosenthal, M.D.    Dg Abd 1 View  09/26/2012  *RADIOLOGY REPORT*  Clinical Data: Abdominal pain.  Colitis.  ABDOMEN - 1 VIEW  Comparison: CT scan dated 09/26/2012  Findings: There is contrast scattered throughout the colon.  There is fairly extensive air in the nondistended large and small bowel. The contrast has progressed since the prior study.  No obstruction. Distended urinary bladder. Colon mucosa appears thickened in the rectosigmoid region.  IMPRESSION: No evidence of ileus.  Prominent gas in nondistended large and small bowel.  However, contrast has progressed from the small bowel and is now throughout the colon including the rectum.  Mucosal thickening seen in the rectosigmoid region consistent with colitis   Original Report Authenticated By: Francene Boyers, M.D.  Scheduled Meds: . amLODipine  5 mg Oral QHS  . aspirin EC  325 mg Oral QHS  . docusate sodium  100 mg Oral BID  . enoxaparin (LOVENOX) injection  30 mg Subcutaneous Q24H  . lisinopril  20 mg Oral QHS  . multivitamin with minerals  1 tablet Oral Daily  . saccharomyces boulardii  250 mg Oral BID  . simvastatin  20 mg Oral QHS  . vancomycin  125 mg Oral Q6H   Continuous Infusions:    Time spent: 25 minutes    Isiaah Cuervo  [facility] Pager [phone] If 7PM-7AM, please contact night-coverage at [email or web address], password [facility] 09/27/2012, 3:03 PM  LOS: 8 days

## 2012-09-28 DIAGNOSIS — M79609 Pain in unspecified limb: Secondary | ICD-10-CM

## 2012-09-28 DIAGNOSIS — R609 Edema, unspecified: Secondary | ICD-10-CM

## 2012-09-28 DIAGNOSIS — S82841A Displaced bimalleolar fracture of right lower leg, initial encounter for closed fracture: Secondary | ICD-10-CM

## 2012-09-28 LAB — CBC
HCT: 33.4 % — ABNORMAL LOW (ref 39.0–52.0)
Hemoglobin: 11.2 g/dL — ABNORMAL LOW (ref 13.0–17.0)
MCHC: 33.5 g/dL (ref 30.0–36.0)
RBC: 3.91 MIL/uL — ABNORMAL LOW (ref 4.22–5.81)
WBC: 15 10*3/uL — ABNORMAL HIGH (ref 4.0–10.5)

## 2012-09-28 LAB — GLUCOSE, CAPILLARY
Glucose-Capillary: 148 mg/dL — ABNORMAL HIGH (ref 70–99)
Glucose-Capillary: 151 mg/dL — ABNORMAL HIGH (ref 70–99)

## 2012-09-28 MED ORDER — SACCHAROMYCES BOULARDII 250 MG PO CAPS: 250.0000 mg | ORAL_CAPSULE | Freq: Two times a day (BID) | ORAL | Status: DC

## 2012-09-28 MED ORDER — VANCOMYCIN 50 MG/ML ORAL SOLUTION: 125.0000 mg | Freq: Four times a day (QID) | ORAL | Status: DC

## 2012-09-28 MED ORDER — ASPIRIN 325 MG PO TBEC: 325.0000 mg | DELAYED_RELEASE_TABLET | Freq: Every day | ORAL | Status: DC

## 2012-09-28 NOTE — Progress Notes (Signed)
Physical Therapy Treatment Patient Details Name: Logan Bean MRN: [id number] DOB: July 10, 1921 Today's Date: 09/28/2012 Time: 4098-1191 PT Time Calculation (min): 42 min  PT Assessment / Plan / Recommendation Comments on Treatment Session  Sitting balance with pt performing wt shifts, sit<>stand and standing on L LE in prep for stand/pvt transfers in future, transferred bed to chair with lift and Initiated ROM R shoulder and UE    Follow Up Recommendations  SNF     Does the patient have the potential to tolerate intense rehabilitation     Barriers to Discharge        Equipment Recommendations  None recommended by PT    Recommendations for Other Services OT consult  Frequency Min 3X/week   Plan Discharge plan remains appropriate;Frequency remains appropriate    Precautions / Restrictions Precautions Precautions: Fall;Shoulder Type of Shoulder Precautions: Per RN, PA states no sling necessary and initiate ROM as tolerated Restrictions Weight Bearing Restrictions: Yes RUE Weight Bearing: Non weight bearing RLE Weight Bearing: Non weight bearing Other Position/Activity Restrictions: bed to chair transfers only   Pertinent Vitals/Pain "IT smarts a little" re: shoulder ROM.    Mobility  Bed Mobility Bed Mobility: Supine to Sit Supine to Sit: 1: +2 Total assist;HOB elevated Supine to Sit: Patient Percentage: 40% Details for Bed Mobility Assistance: Pt assisting with L LE to shift to EOB and with L UE to bring trunk upright Transfers Transfers: Sit to Stand;Stand to Sit Sit to Stand: 1: +2 Total assist Sit to Stand: Patient Percentage: 50% Stand to Sit: 1: +2 Total assist Stand to Sit: Patient Percentage: 50% Details for Transfer Assistance: utilized bed to assist with move to standing on L LE - pt achieved standing x 1 min supported by L LE  and assist for balance and to block knee.  LTd by fatigue Ambulation/Gait Ambulation/Gait Assistance: Not tested (comment)     Exercises General Exercises - Upper Extremity Shoulder Flexion: AAROM;10 reps;Seated;Right Shoulder ABduction: AAROM;Right;10 reps;Seated Elbow Flexion: AAROM;Right;15 reps;AROM Elbow Extension: AAROM;AROM;15 reps;Right Digit Composite Flexion: AROM;10 reps Composite Extension: AROM;10 reps;Both General Exercises - Lower Extremity Ankle Circles/Pumps: AROM;Left;10 reps;Supine Quad Sets: AROM;Both;15 reps;Supine Gluteal Sets: AROM;15 reps;Supine;Both Short Arc Quad: AROM;AAROM;Both;15 reps;Supine Heel Slides: AROM;AAROM;Both;15 reps;Supine Hip ABduction/ADduction: AAROM;Both;15 reps;Supine Straight Leg Raises: AROM;AAROM;Left;15 reps;Supine Hand Exercises Forearm Supination: AROM;10 reps;AAROM;Right Forearm Pronation: AROM;AAROM;10 reps;Right   PT Diagnosis:    PT Problem List:   PT Treatment Interventions:     PT Goals Acute Rehab PT Goals PT Goal Formulation: With patient Time For Goal Achievement: 10/09/12 Potential to Achieve Goals: Good Pt will go Supine/Side to Sit: with mod assist PT Goal: Supine/Side to Sit - Progress: Progressing toward goal Pt will Sit at Edge of Bed: with supervision;6-10 min;with unilateral upper extremity support PT Goal: Sit at Edge Of Bed - Progress: Met Pt will go Sit to Supine/Side: with mod assist PT Goal: Sit to Supine/Side - Progress: Progressing toward goal Pt will go Sit to Stand: with +2 total assist;from elevated surface PT Goal: Sit to Stand - Progress: Progressing toward goal Pt will go Stand to Sit: with +2 total assist PT Goal: Stand to Sit - Progress: Progressing toward goal Pt will Transfer Bed to Chair/Chair to Bed: with +2 total assist PT Transfer Goal: Bed to Chair/Chair to Bed - Progress: Progressing toward goal  Visit Information  Last PT Received On: 09/28/12 Assistance Needed: +2    Subjective Data  Subjective: It feels so good to be up out of that bed Patient  Stated Goal: Rehab and then home   Cognition   Cognition Overall Cognitive Status: Appears within functional limits for tasks assessed/performed Arousal/Alertness: Awake/alert Orientation Level: Appears intact for tasks assessed Behavior During Session: [facility] for tasks performed    Balance  Static Sitting Balance Static Sitting - Balance Support: Feet supported Static Sitting - Level of Assistance: 7: Independent;5: Stand by assistance Static Sitting - Comment/# of Minutes: 8  End of Session PT - End of Session Equipment Utilized During Treatment: Other (comment) Activity Tolerance: Patient tolerated treatment well Patient left: in chair;with call bell/phone within reach;with nursing in room Nurse Communication: Mobility status;Need for lift equipment;Weight bearing status;Other (comment)   GP     Jaaziah Schulke 09/28/2012, 12:11 PM

## 2012-09-28 NOTE — Progress Notes (Signed)
   Subjective: 8 Days Post-Op Procedure(s) (LRB): OPEN REDUCTION INTERNAL FIXATION (ORIF) ANKLE FRACTURE (Right)  Pt c/o mild leg pain and swelling to right elbow and arm Ankle is doing well thus far Still dealing with C. Difficile  Patient reports pain as mild.  Objective:    VITALS:   Filed Vitals:   09/28/12 0559  BP: 136/65  Pulse: 86  Temp: 97.6 F (36.4 C)  Resp: 20   Right upper extremity: well healed incision to anterior shoulder Moderate edema to elbow but good rom without pain this morning Right ankle in cast Neurologically intact distally  LABS  Recent Labs  09/26/12 0837 09/27/12 0402 09/28/12 0425  HGB 10.4* 10.4* 11.2*  HCT 30.6* 32.4* 33.4*  WBC 22.1* 16.4* 15.0*  PLT 414* 391 580*     Recent Labs  09/27/12 0402  NA 131*  K 3.8  BUN 49*  CREATININE 0.87  GLUCOSE 169*     Assessment/Plan: 8 Days Post-Op Procedure(s) (LRB): OPEN REDUCTION INTERNAL FIXATION (ORIF) ANKLE FRACTURE (Right)  Plan for upper extremity doppler to r/o dvt.  Recommend more time out of the sling and believe the sling is the main cause for the edema in the elbow Will continue to monitor his progress Right ankle fracture in a cast and is stable at this point   Alphonsa Overall, MPAS, PA-C  09/28/2012, 7:51 AM
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
## 2012-09-28 NOTE — Progress Notes (Signed)
Physical Therapy Treatment Patient Details Name: Logan Bean MRN: [id number] DOB: 04-May-1921 Today's Date: 09/28/2012 Time: 4098-1191 PT Time Calculation (min): 27 min  PT Assessment / Plan / Recommendation Comments on Treatment Session  OOB deferred to after b'fast at pt request.  RN to contact Dr re: limitations for shoulder including use of sling    Follow Up Recommendations  SNF     Does the patient have the potential to tolerate intense rehabilitation     Barriers to Discharge        Equipment Recommendations  None recommended by PT    Recommendations for Other Services OT consult  Frequency Min 3X/week   Plan Discharge plan remains appropriate;Frequency remains appropriate    Precautions / Restrictions Precautions Precautions: Fall;Shoulder Type of Shoulder Precautions: sling R UE Restrictions Weight Bearing Restrictions: Yes RUE Weight Bearing: Non weight bearing RLE Weight Bearing: Non weight bearing Other Position/Activity Restrictions: bed to chair transfers only   Pertinent Vitals/Pain Min c/o pain    Mobility       Exercises General Exercises - Lower Extremity Ankle Circles/Pumps: AROM;Left;10 reps;Supine Quad Sets: AROM;Both;15 reps;Supine Gluteal Sets: AROM;15 reps;Supine;Both Short Arc Quad: AROM;AAROM;Both;15 reps;Supine Heel Slides: AROM;AAROM;Both;15 reps;Supine Hip ABduction/ADduction: AAROM;Both;15 reps;Supine Straight Leg Raises: AROM;AAROM;Left;15 reps;Supine   PT Diagnosis:    PT Problem List:   PT Treatment Interventions:     PT Goals Acute Rehab PT Goals PT Goal Formulation: With patient  Visit Information  Last PT Received On: 09/28/12 Assistance Needed: +1    Subjective Data  Subjective: I'm glad you are here Patient Stated Goal: Rehab and then home   Cognition  Cognition Overall Cognitive Status: Appears within functional limits for tasks assessed/performed Arousal/Alertness: Awake/alert Orientation Level: Appears  intact for tasks assessed Behavior During Session: [facility] for tasks performed    Balance     End of Session PT - End of Session Activity Tolerance: Patient tolerated treatment well Patient left: in bed;with call bell/phone within reach;with family/visitor present   GP     Logan Bean 09/28/2012, 11:57 AM

## 2012-09-28 NOTE — Progress Notes (Signed)
CSW assisting with d/c summary. [facility] contacted and is able to admit pt later this afternoon. D/C Summary is needed prirot o 4pm. CSW will follow to assist with d/c planning to SNF.  Cori Razor LCSW [phone]

## 2012-09-28 NOTE — Progress Notes (Signed)
Right:  No evidence of DVT or superficial thrombosis.    

## 2012-09-29 DIAGNOSIS — E1149 Type 2 diabetes mellitus with other diabetic neurological complication: Secondary | ICD-10-CM | POA: Diagnosis not present

## 2012-09-29 DIAGNOSIS — Z471 Aftercare following joint replacement surgery: Secondary | ICD-10-CM | POA: Diagnosis not present

## 2012-09-29 DIAGNOSIS — R0602 Shortness of breath: Secondary | ICD-10-CM | POA: Diagnosis not present

## 2012-09-29 DIAGNOSIS — M6281 Muscle weakness (generalized): Secondary | ICD-10-CM | POA: Diagnosis not present

## 2012-09-29 DIAGNOSIS — Z9889 Other specified postprocedural states: Secondary | ICD-10-CM | POA: Diagnosis not present

## 2012-09-29 DIAGNOSIS — R609 Edema, unspecified: Secondary | ICD-10-CM | POA: Diagnosis not present

## 2012-09-29 DIAGNOSIS — E876 Hypokalemia: Secondary | ICD-10-CM | POA: Diagnosis not present

## 2012-09-29 DIAGNOSIS — S8290XS Unspecified fracture of unspecified lower leg, sequela: Secondary | ICD-10-CM | POA: Diagnosis not present

## 2012-09-29 DIAGNOSIS — M75120 Complete rotator cuff tear or rupture of unspecified shoulder, not specified as traumatic: Secondary | ICD-10-CM | POA: Diagnosis not present

## 2012-09-29 DIAGNOSIS — S82843A Displaced bimalleolar fracture of unspecified lower leg, initial encounter for closed fracture: Secondary | ICD-10-CM | POA: Diagnosis not present

## 2012-09-29 DIAGNOSIS — Z794 Long term (current) use of insulin: Secondary | ICD-10-CM | POA: Diagnosis not present

## 2012-09-29 DIAGNOSIS — Z79899 Other long term (current) drug therapy: Secondary | ICD-10-CM | POA: Diagnosis not present

## 2012-09-29 DIAGNOSIS — J9819 Other pulmonary collapse: Secondary | ICD-10-CM | POA: Diagnosis not present

## 2012-09-29 DIAGNOSIS — J90 Pleural effusion, not elsewhere classified: Secondary | ICD-10-CM | POA: Diagnosis not present

## 2012-09-29 DIAGNOSIS — IMO0001 Reserved for inherently not codable concepts without codable children: Secondary | ICD-10-CM | POA: Diagnosis not present

## 2012-09-29 DIAGNOSIS — E785 Hyperlipidemia, unspecified: Secondary | ICD-10-CM | POA: Diagnosis not present

## 2012-09-29 DIAGNOSIS — E079 Disorder of thyroid, unspecified: Secondary | ICD-10-CM | POA: Diagnosis not present

## 2012-09-29 DIAGNOSIS — R279 Unspecified lack of coordination: Secondary | ICD-10-CM | POA: Diagnosis not present

## 2012-09-29 DIAGNOSIS — Z96619 Presence of unspecified artificial shoulder joint: Secondary | ICD-10-CM | POA: Diagnosis not present

## 2012-09-29 DIAGNOSIS — S8290XD Unspecified fracture of unspecified lower leg, subsequent encounter for closed fracture with routine healing: Secondary | ICD-10-CM | POA: Diagnosis not present

## 2012-09-29 DIAGNOSIS — A0472 Enterocolitis due to Clostridium difficile, not specified as recurrent: Secondary | ICD-10-CM | POA: Diagnosis not present

## 2012-09-29 DIAGNOSIS — D649 Anemia, unspecified: Secondary | ICD-10-CM | POA: Diagnosis not present

## 2012-09-29 DIAGNOSIS — D638 Anemia in other chronic diseases classified elsewhere: Secondary | ICD-10-CM | POA: Diagnosis not present

## 2012-09-29 DIAGNOSIS — E138 Other specified diabetes mellitus with unspecified complications: Secondary | ICD-10-CM | POA: Diagnosis not present

## 2012-09-29 DIAGNOSIS — I10 Essential (primary) hypertension: Secondary | ICD-10-CM | POA: Diagnosis not present

## 2012-09-29 DIAGNOSIS — R634 Abnormal weight loss: Secondary | ICD-10-CM | POA: Diagnosis not present

## 2012-09-29 DIAGNOSIS — Z792 Long term (current) use of antibiotics: Secondary | ICD-10-CM | POA: Diagnosis not present

## 2012-09-29 DIAGNOSIS — E119 Type 2 diabetes mellitus without complications: Secondary | ICD-10-CM | POA: Diagnosis not present

## 2012-09-29 DIAGNOSIS — J810 Acute pulmonary edema: Secondary | ICD-10-CM | POA: Diagnosis not present

## 2012-09-29 DIAGNOSIS — Z5189 Encounter for other specified aftercare: Secondary | ICD-10-CM | POA: Diagnosis not present

## 2012-09-29 DIAGNOSIS — R635 Abnormal weight gain: Secondary | ICD-10-CM | POA: Diagnosis not present

## 2012-09-29 DIAGNOSIS — R29898 Other symptoms and signs involving the musculoskeletal system: Secondary | ICD-10-CM | POA: Diagnosis not present

## 2012-09-29 DIAGNOSIS — K219 Gastro-esophageal reflux disease without esophagitis: Secondary | ICD-10-CM | POA: Diagnosis not present

## 2012-09-29 DIAGNOSIS — R6889 Other general symptoms and signs: Secondary | ICD-10-CM | POA: Diagnosis not present

## 2012-09-29 LAB — BASIC METABOLIC PANEL
BUN: 47 mg/dL — ABNORMAL HIGH (ref 6–23)
Chloride: 104 mEq/L (ref 96–112)
GFR calc non Af Amer: 72 mL/min — ABNORMAL LOW (ref >90)
Glucose, Bld: 200 mg/dL — ABNORMAL HIGH (ref 70–99)
Potassium: 4.4 mEq/L (ref 3.5–5.1)
Sodium: 133 mEq/L — ABNORMAL LOW (ref 135–145)

## 2012-09-29 LAB — CBC
HCT: 32.2 % — ABNORMAL LOW (ref 39.0–52.0)
Hemoglobin: 10.7 g/dL — ABNORMAL LOW (ref 13.0–17.0)
MCH: 28.8 pg (ref 26.0–34.0)
MCHC: 33.2 g/dL (ref 30.0–36.0)
MCV: 86.6 fL (ref 78.0–100.0)
RDW: 15.1 % (ref 11.5–15.5)

## 2012-09-29 MED ORDER — SACCHAROMYCES BOULARDII 250 MG PO CAPS: 250.0000 mg | ORAL_CAPSULE | Freq: Two times a day (BID) | ORAL | Status: DC

## 2012-09-29 MED ORDER — HYDROCODONE-ACETAMINOPHEN 5-325 MG PO TABS: 1.0000 | ORAL_TABLET | Freq: Four times a day (QID) | ORAL | Status: DC | PRN

## 2012-09-29 MED ORDER — ASPIRIN EC 81 MG PO TBEC: 81.0000 mg | DELAYED_RELEASE_TABLET | Freq: Every day | ORAL | Status: DC

## 2012-09-29 MED ORDER — VANCOMYCIN 50 MG/ML ORAL SOLUTION: 125.0000 mg | Freq: Four times a day (QID) | ORAL | Status: DC

## 2012-09-29 NOTE — Progress Notes (Signed)
Clinical Social Work Department CLINICAL SOCIAL WORK PLACEMENT NOTE 09/29/2012  Patient:  Logan Bean,Logan Bean  Account Number:  [id number] Admit date:  09/19/2012  Clinical Social Worker:  Jacelyn Grip  Date/time:  09/21/2012 02:30 PM  Clinical Social Work is seeking post-discharge placement for this patient at the following level of care:   SKILLED NURSING   (*CSW will update this form in Epic as items are completed)   09/21/2012  Patient/family provided with [facility] Department of Clinical Social Work's list of facilities offering this level of care within the geographic area requested by the patient (or if unable, by the patient's family).  09/21/2012  Patient/family informed of their freedom to choose among providers that offer the needed level of care, that participate in Medicare, Medicaid or managed care program needed by the patient, have an available bed and are willing to accept the patient.  09/21/2012  Patient/family informed of [facility]' ownership interest in [facility], as well as of the fact that they are under no obligation to receive care at this facility.  PASARR submitted to EDS on 09/21/2012 PASARR number received from [employer] on 09/21/2012  FL2 transmitted to all facilities in geographic area requested by pt/family on  09/21/2012 FL2 transmitted to all facilities within larger geographic area on   Patient informed that his/her managed care company has contracts with or will negotiate with  certain facilities, including the following:     Patient/family informed of bed offers received:  09/23/2012 Patient chooses bed at [facility] Physician recommends and patient chooses bed at    Patient to be transferred to [facility] on  09/29/2012 Patient to be transferred to facility by [facility]  The following physician request were entered in Epic:   Additional Comments:  Cori Razor LCSW [phone]

## 2012-09-29 NOTE — Progress Notes (Signed)
CSW assisting with d/c planning. [facility] is able to admit pt today if pt is stable for d/c. CSW will follow to assist with d/c planning to SNF.  Cori Razor LCSW [phone]
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
## 2012-09-29 NOTE — Progress Notes (Signed)
   Subjective: 9 Days Post-Op Procedure(s) (LRB): OPEN REDUCTION INTERNAL FIXATION (ORIF) ANKLE FRACTURE (Right)  S/p right reverse total shoulder arthroplasty  Patient reports pain as mild. Pt mainly c/o leg pain Elbow swelling improved slightly with negative doppler scan yesterday  Objective:   VITALS:   Filed Vitals:   09/29/12 0615  BP: 102/68  Pulse: 92  Temp: 97.4 F (36.3 C)  Resp: 18   Right shoulder incision healing well No erythema or drainage Neurologically intact distally Right lower leg in cast with no signs of skin breakdown  LABS  Recent Labs  09/27/12 0402 09/28/12 0425 09/29/12 0415  HGB 10.4* 11.2* 10.7*  HCT 32.4* 33.4* 32.2*  WBC 16.4* 15.0* 15.6*  PLT 391 580* 546*     Recent Labs  09/27/12 0402 09/29/12 0415  NA 131* 133*  K 3.8 4.4  BUN 49* 47*  CREATININE 0.87 0.91  GLUCOSE 169* 200*     Assessment/Plan: 9 Days Post-Op Procedure(s) (LRB): OPEN REDUCTION INTERNAL FIXATION (ORIF) ANKLE FRACTURE (Right)  Stable from orthopedic standpoint for d/c to snf  Discharge to SNF F/u in 2 weeks with Dr. Beryle Lathe, MPAS, PA-C  09/29/2012, 9:00 AM

## 2012-09-30 ENCOUNTER — Other Ambulatory Visit: Payer: Self-pay | Admitting: *Deleted

## 2012-09-30 MED ORDER — HYDROCODONE-ACETAMINOPHEN 5-325 MG PO TABS: ORAL_TABLET | ORAL | Status: DC

## 2012-10-01 ENCOUNTER — Non-Acute Institutional Stay (SKILLED_NURSING_FACILITY): Payer: Medicare Other | Admitting: Internal Medicine

## 2012-10-01 DIAGNOSIS — R29898 Other symptoms and signs involving the musculoskeletal system: Secondary | ICD-10-CM

## 2012-10-01 DIAGNOSIS — R0602 Shortness of breath: Secondary | ICD-10-CM | POA: Diagnosis not present

## 2012-10-01 DIAGNOSIS — A0472 Enterocolitis due to Clostridium difficile, not specified as recurrent: Secondary | ICD-10-CM | POA: Diagnosis not present

## 2012-10-01 DIAGNOSIS — R609 Edema, unspecified: Secondary | ICD-10-CM | POA: Diagnosis not present

## 2012-10-01 DIAGNOSIS — E1365 Other specified diabetes mellitus with hyperglycemia: Secondary | ICD-10-CM | POA: Diagnosis not present

## 2012-10-01 DIAGNOSIS — E1149 Type 2 diabetes mellitus with other diabetic neurological complication: Secondary | ICD-10-CM

## 2012-10-01 NOTE — Progress Notes (Signed)
[facility]an [facility] SNF Chief complaint: This is an admission for SNF close they [place] 09/19/2012 to 03/19/201  History: This is a 79 year old person who lives independently in the community but with some home support available. The patient had just gotten out of the hospital on March 9 after undergoing a right total shoulder replacement. Bathroom the patient was attempting to stand with assistance and fell between the toilet and the wall. The patient suffered a bimalleolar fracture with significant lateral displacement of the fracture fragments lateral subluxation of the talus. The patient underwent ORIF by Dr. Shelle Iron. And apparently tolerated the surgery reasonably well. Major issue in hospital appeared to start on March 12 with diarrhea and diffuse abdominal pain and leukocytosis. CT scan showed diffuse colitis without evidence ischemia or obstruction. The patient was initially put on Flagyl but then switched to vancomycin on 09/24/2012. CT abdomen was repeated and showed mild increase of bowel wall thickening. The patient was made n.p.o. at the time of discharge the abdominal pain and leukocytosis were trending down. The patient is on a two-week course of oral vancomycin. Early at time of transfer to SNF her to have nine more days  Past medical:  #1 type 2 diabetes patient tells me this is always been diet controlled, hemoglobin A1c of 6.5 as an outpatient #2 hypertension #3 hyperlipidemia #4 previous left shoulder rotator cuff surgery with chronic limitation of the left shoulder function #5 edema of the right arm with duplex ultrasound negative for DVT #6 Severe OA of knees, hips #7 gastric ulcer ?GERd  Discharge medications; #1 Norvasc 5 mg each bedtime #2 aspirin 81 mg at bedtime #3 Fish oil 1200 mg by mouth daily #4Norco one tablet every 6 hrs when necessary for pain #5 Robaxin 500 3 times a day when necessary #6 Prilosec 20 mg by mouth daily apparently with  #7 florastore 250 by mouth  twice a day #8 Zocor 20 mg each bedtime #9 vancomycin 50 mg per mll 2.5 mV for 125 mg every 6 take for an additional nine days  Social history: Patient lives in an attached apartment with close friends living in the house. It would appear that there is some availability or help in the home should that be necessary Ex-smoker quitting many years ago. Using a cane prior to these admissions the hospital. Her is no fall history. Patient was independent with ADLs and IADLs  Review of Systems: Resp: no cough, SOB,  CVS: No chest pain, feels "swollen" Abdomen: 2 diarrheal stools yesterday.  GU: No voiding difficulties.   Physical Exam: P82 RR22 Heent oral exam moist mucous membranes Resp: clear a/e no wheezing CVS: Hs Normal, no murmers, no dehydration. Significant edema up to above rt elbow but no warmth or tenderness. Pulses intact.  GI: Abdomen is distended expecially in upper quadrant. Some mild tenderness. No peritoneal signs. Incontinent of Liquid BM. GU: Some suprapubic fullness, no CVA tenderness.  MSK: Surgical incision in Rt shoulder is well approxiamted. No infection. Marked limitation in range of both shoulders. Does not have full flexion of Left knee. In cast to rt leg. Mild venous stasis. Lt leg CNS: 3+/5 hip flexion. 4/5 hip abduction. Areflexic in lower extremities ?neuropathy.  Did not attempt to mobilize.  MS: appears normal, no depression or delirium Skin. No pressure areas but at high risk  Impression: Pseudomembranous Colits: Still on PO vanc which is appropriate. Will have staff closely mmonitor stools. Distention of abdomen is a worry.  RT arm edema: Post rt shoulder  surgery. Note negative Duplex US Hypertension; monitor. Lisinoipril was d/c in hospital Hyperlipidemia: check ck on statin GERD with hix of PUD continue PPI for now Modrate to severe LE weakness. Needs aggressive Pt. ?underlying neuropoathy ?DM??? S/p Rt shoulder replacement; Now with significant limitation  of both shoulder function. This will need aggressive PT  Patient is at high risk for innability to return to full premorbid level of function. Monitor GI status carefully. Labs ordered.

## 2012-10-06 DIAGNOSIS — Z96619 Presence of unspecified artificial shoulder joint: Secondary | ICD-10-CM | POA: Diagnosis not present

## 2012-10-08 ENCOUNTER — Other Ambulatory Visit: Payer: Self-pay | Admitting: *Deleted

## 2012-10-08 DIAGNOSIS — Z96619 Presence of unspecified artificial shoulder joint: Secondary | ICD-10-CM | POA: Diagnosis not present

## 2012-10-08 DIAGNOSIS — E1365 Other specified diabetes mellitus with hyperglycemia: Secondary | ICD-10-CM

## 2012-10-08 DIAGNOSIS — M109 Gout, unspecified: Secondary | ICD-10-CM

## 2012-10-08 DIAGNOSIS — I10 Essential (primary) hypertension: Secondary | ICD-10-CM

## 2012-10-08 DIAGNOSIS — E785 Hyperlipidemia, unspecified: Secondary | ICD-10-CM

## 2012-10-09 ENCOUNTER — Non-Acute Institutional Stay (SKILLED_NURSING_FACILITY): Payer: Medicare Other | Admitting: Internal Medicine

## 2012-10-09 DIAGNOSIS — R609 Edema, unspecified: Secondary | ICD-10-CM | POA: Diagnosis not present

## 2012-10-09 DIAGNOSIS — A0472 Enterocolitis due to Clostridium difficile, not specified as recurrent: Secondary | ICD-10-CM

## 2012-10-09 DIAGNOSIS — E1365 Other specified diabetes mellitus with hyperglycemia: Secondary | ICD-10-CM

## 2012-10-09 DIAGNOSIS — E1149 Type 2 diabetes mellitus with other diabetic neurological complication: Secondary | ICD-10-CM | POA: Diagnosis not present

## 2012-10-09 DIAGNOSIS — R29898 Other symptoms and signs involving the musculoskeletal system: Secondary | ICD-10-CM | POA: Diagnosis not present

## 2012-10-09 DIAGNOSIS — E138 Other specified diabetes mellitus with unspecified complications: Secondary | ICD-10-CM | POA: Diagnosis not present

## 2012-10-09 NOTE — Progress Notes (Signed)
Patient ID: Logan Bean, male   DOB: 1920-12-21, 79 y.o.   MRN: [id number] Chief complaint;  followup of pseudomembranous colitis, new widespread edema.  History; I'm following Arch in [facility] skilled nursing facility. Admission was last week after a history of right shoulder, subsequent fall with right bimalleolar fracture, development of significant pseudomembranous colitis. When I admitted the patient to the facility on March 21, her was still significant diarrhea therefore I actually extended the dose of oral vancomycin. The patient has been reviewed by Dr. August Saucer and still is nonweightbearing on the right leg with a Cam Walker. Shoulders seems to have healed well.  There was significant edema of the right arm last week which actually seems to be a lot better. Duplex ultrasound of the right arm was done in the hospital which was negative for DVT.   Other issues are of concern to the patient include development of right and now left lower extremity edema. His apparently has gotten a lot worse over the last 2-3 days. Lab work done on 3/27 shows a albumin of 2.4, BUN of 14 creatinine of 0.59, liver function tests are normal. White blood count 8.3 hemoglobin 11.1. Differential is normal.  Review of systems; 1 respiratory-no shortness of breath #2 cardiac-no chest pain. No cardiac history the patient is aware of. Although after her left shoulder surgery a year ago there was apparently a significant degree of swelling for which the patient was put on Lasix. #3 GI; one bowel movement per day which is now semisolid. No abnormal pain no nausea no vomiting.  #4 extremity significant lower extremity edema. Apparently the left leg edema has only come on and last 48 hours.  Physical exam; Respiratory-clear entry bilaterally no crackles or wheeze Cardiac-heart sounds pulse rate is about 96 with some PACs no murmurs, JVD is not elevated. GI-sounds are positive. No tenderness. No liver or  spleen. Extremities there is indeed significant edema of the left lower extremity to about the knee. There is calf tenderness. Degrees of edema in the right calf. There is a 2+ coccyx edema as well GU-no bladder distention or tenderness is noted  Impression; #1 pseudomembranous colitis-seems to be improving will let clinically in terms of lab work. I will discontinue the vancomycin in 48 hours.  #2 lower extremity edema-in this setting we'll need to rule out DVT I ordered a duplex ultrasound of the bilateral lower extremities which should be done later today.. #3 hypoalbuminemia; at least in part probably due to the significant recent surgeries and colitis. Already on a protein supplement, I have discussed the increasing her oral intake with the patient and family. #4 type 2.diabetes which I gather has been borderline with a hemoglobin A1c of 6.5 CBG on blood work done in the facility was actually 221 this may not be fasting but it deserves further monitoring.

## 2012-10-14 ENCOUNTER — Non-Acute Institutional Stay (SKILLED_NURSING_FACILITY): Payer: Medicare Other | Admitting: Internal Medicine

## 2012-10-14 DIAGNOSIS — D638 Anemia in other chronic diseases classified elsewhere: Secondary | ICD-10-CM

## 2012-10-14 DIAGNOSIS — E876 Hypokalemia: Secondary | ICD-10-CM | POA: Diagnosis not present

## 2012-10-14 DIAGNOSIS — R609 Edema, unspecified: Secondary | ICD-10-CM | POA: Diagnosis not present

## 2012-10-14 DIAGNOSIS — Z79899 Other long term (current) drug therapy: Secondary | ICD-10-CM | POA: Diagnosis not present

## 2012-10-17 ENCOUNTER — Non-Acute Institutional Stay (SKILLED_NURSING_FACILITY): Payer: Medicare Other | Admitting: Internal Medicine

## 2012-10-17 DIAGNOSIS — A0472 Enterocolitis due to Clostridium difficile, not specified as recurrent: Secondary | ICD-10-CM | POA: Diagnosis not present

## 2012-10-17 NOTE — Progress Notes (Signed)
Patient ID: Logan Bean, male   DOB: Apr 25, 1921, 79 y.o.   MRN: [id number] Chief complaint; recurrent diarrhea History; patient it was recently admitted here after a right ankle fracture and ORIF by Dr. Shelle Iron. During this hospitalization he developed profuse diarrhea. Stool was C. difficile positive. Patient was started on empiric Cipro and Flagyl and then switched to vancomycin on 09/24/2012. The vancomycin continued orally on 10/12/2012. At that point the patient's stool was semi-formed and down to once a day. Apparently yesterday the patient started to develop acute perfuse watery diarrhea 5-6 times per day. There is only than one bowel movement today but again it is liquid. There is no systemic symptoms including no fever, chills, abdominal pain however there is nausea and anorexia.   Review of system Gen. no fever or chills Respiratory no cough or shortness of breath Cardiac no chest pain GI no abdominal pain or vomiting however diarrheas noted.  Physical 98.4 93 20 131/65 patient is not in any distress Respiratory clear entry bilaterally Heart heart sounds are tachycardic at 110 Abdomen; bowel sounds are positive, no liver no spleen no masses. Some left lower quadrant tenderness but no guarding or rebound Extremities much improved edema on Lasix 20 mg.  Impressions plan #1 recurrent pseudomembranous colitis. In this setting this is certainly the most likely diagnosis. I will send a stool for C. difficile toxin and resume the vancomycin probably for a total of 2 weeks and follow the patient clinically. Lab work will be repeated tomorrow. We'll ask the staff to be vigilant about a stool charting, and oral intake

## 2012-10-21 ENCOUNTER — Non-Acute Institutional Stay (SKILLED_NURSING_FACILITY): Payer: Medicare Other | Admitting: Internal Medicine

## 2012-10-21 DIAGNOSIS — E119 Type 2 diabetes mellitus without complications: Secondary | ICD-10-CM

## 2012-10-21 DIAGNOSIS — A0472 Enterocolitis due to Clostridium difficile, not specified as recurrent: Secondary | ICD-10-CM | POA: Diagnosis not present

## 2012-10-21 DIAGNOSIS — S82891D Other fracture of right lower leg, subsequent encounter for closed fracture with routine healing: Secondary | ICD-10-CM

## 2012-10-21 DIAGNOSIS — I10 Essential (primary) hypertension: Secondary | ICD-10-CM | POA: Diagnosis not present

## 2012-10-21 DIAGNOSIS — S8290XD Unspecified fracture of unspecified lower leg, subsequent encounter for closed fracture with routine healing: Secondary | ICD-10-CM

## 2012-10-22 ENCOUNTER — Other Ambulatory Visit: Payer: Medicare Other

## 2012-10-22 ENCOUNTER — Non-Acute Institutional Stay (SKILLED_NURSING_FACILITY): Payer: Medicare Other | Admitting: Internal Medicine

## 2012-10-22 DIAGNOSIS — A0472 Enterocolitis due to Clostridium difficile, not specified as recurrent: Secondary | ICD-10-CM | POA: Diagnosis not present

## 2012-10-22 DIAGNOSIS — R6889 Other general symptoms and signs: Secondary | ICD-10-CM | POA: Diagnosis not present

## 2012-10-22 DIAGNOSIS — R7989 Other specified abnormal findings of blood chemistry: Secondary | ICD-10-CM

## 2012-10-22 NOTE — Progress Notes (Signed)
Patient ID: Logan Bean, male   DOB: October 12, 1920, 79 y.o.   MRN: [id number]   Chief complaint; recurrent diarrhea  History; patient  was recently admitted here after a right ankle fracture and ORIF by Dr. Shelle Iron. During this hospitalization he developed profuse diarrhea. Stool was C. difficile positive. Patient was started on empiric Cipro and Flagyl and then switched to vancomycin on 09/24/2012. The vancomycin continued orally on 10/12/2012. At that point the patient's stool was semi-formed and down to once a day. Apparently last week the patient started to develop acute perfuse watery diarrhea 5-6 times per day. I saw the patient on April 6, and presumed a recurrence of pseudomembranous colitis colitis and started the patient back on vancomycin 125 by mouth every 6 for 2 weeks. Stool study was positive for C. difficile toxin. The patient states that he feels better with no abnormal pain, no nausea no vomiting and no systemic symptoms. 3 liquid bowel movements yesterday according to the patient however one large one this morning. Eating and drinking normally.    Review of system  Gen. no fever or chills  Respiratory no cough or shortness of breath  Cardiac no chest pain  GI no abdominal pain or vomiting however diarrheas noted but improved. Endocrine patient had TSH ordered for unclear reasons his TSH is suppressed at 0.262 the patient is not on Synthroid. I wonder if this was done due to persistent tachycardia.    Physical 98. 110 20  patient is not in any distress  Respiratory clear entry bilaterally  Heart heart sounds are tachycardic at 110  Abdomen; bowel sounds are positive, no liver no spleen no masses. Some left lower quadrant tenderness but no guarding or rebound  Extremities much improved edema on Lasix 20 mg.  Impressions plan  #1 recurrent pseudomembranous colitis. This has improved, back on oral vancomycin .  #2 suppression of the TSH . I will check a free T3 and free T4 due to due  the presence of persistent tachycardia although there certainly certainly other reasons for this .  The patient is hopeful to begin weightbearing and active physical therapy on the original fracture site [ankle] lab work ordered for next week

## 2012-10-25 NOTE — Progress Notes (Signed)
Patient ID: Logan Bean, male   DOB: 1920-09-02, 79 y.o.   MRN: [id number]        PROGRESS NOTE  DATE:   10/14/2012  FACILITY:  [facility]   LEVEL OF CARE: SNF  Acute Visit  CHIEF COMPLAINT:  Manage weight gain and anemia of chronic disease.    HISTORY OF PRESENT ILLNESS: I was requested by the staff to assess the patient regarding above problem(s):  Weight gain: Dietitian reports that patient has gained 5.8 pounds which is significant in seven days.  The percentage is 3.47.  Patient is complaining of increasing lower extremity swelling, especially during the day, which subsides at night.  He denies chest pain, shortness of breath or calf pain.  He does not have a history of CHF or chronic venous insufficiency.    ANEMIA: The anemia has been stable. The patient denies fatigue, melena or hematochezia. No complications from the medications currently being used.  On 10/12/2012, hemoglobin was 11.6, MCV 88.8.   On 10/07/2012, hemoglobin was 11.1.  He is currently not on any medications for anemia.    PAST MEDICAL HISTORY : Reviewed.  No changes.  CURRENT MEDICATIONS: Reviewed per [facility]  REVIEW OF SYSTEMS:  GENERAL: no change in appetite, no fatigue, no weight changes, no fever, chills or weakness RESPIRATORY: no cough, SOB, DOE,, wheezing, hemoptysis CARDIAC: no chest pain or palpitations, increasing lower extremity swelling GI: no abdominal pain, diarrhea, constipation, heart burn, nausea or vomiting  PHYSICAL EXAMINATION  GENERAL: no acute distress, normal body habitus EYES: conjunctivae normal, sclerae normal, normal eye lids NECK: supple, trachea midline, no neck masses, no thyroid tenderness, no thyromegaly LYMPHATICS: no LAN in the neck, no supraclavicular LAN RESPIRATORY: breathing is even & unlabored, BS CTAB CARDIAC: RRR, no murmur,no extra heart sounds EDEMA/VARICOSITIES:  +2 bilateral lower extremity edema, right lower extremity has a brace ARTERIAL:  pedal pulses  nonpalpable  GI: abdomen soft, normal BS, no masses, no tenderness, no hepatomegaly, no splenomegaly PSYCHIATRIC: the patient is alert & oriented to person, affect & behavior appropriate  LABS/RADIOLOGY: On 10/12/2012:   Glucose 146, otherwise BMP normal.   In 09/2012:  Albumin 2.4, total protein 4.1, otherwise liver profile normal.    ASSESSMENT/PLAN:  Bilateral lower extremity edema.  New onset.  Significant problem.  Start lasix 20 mg q.d.  Monitor weights.  Check TSH and BMP.  Discontinue Norvasc.    Anemia of chronic disease.  Hemoglobin improved.    Hypokalemia.  Since I am initiating lasix, start KCl 20 mEq q.d.    Check BMP on 10/18/2012.    CPT CODE: 40981.

## 2012-10-26 ENCOUNTER — Ambulatory Visit: Payer: Self-pay | Admitting: Internal Medicine

## 2012-11-03 DIAGNOSIS — Z96619 Presence of unspecified artificial shoulder joint: Secondary | ICD-10-CM | POA: Diagnosis not present

## 2012-11-08 NOTE — Progress Notes (Signed)
Patient ID: Logan Bean, male   DOB: 10/23/1920, 79 y.o.   MRN: [id number]        PROGRESS NOTE  DATE:  10/21/2012  FACILITY: [facility]   LEVEL OF CARE: SNF  Routine Visit  CHIEF COMPLAINT:  Manage C.difficile colitis, diabetes mellitus and right ankle fracture.  HISTORY OF PRESENT ILLNESS:    REASSESSMENT OF ONGOING PROBLEM(S):    RIGHT ANKLE FRACTURE:  The patient had a closed bimalleolar fracture of right ankle.  He underwent ORIF and then admitted to this facility for short-term rehabilitation.  He  currently wears a right lower extremity brace.  He denies right ankle pain.    C.DIFFICILE COLITIS:  The patient's stool culture from 10/19/2012 is positive for C. Diff.  He was started on vancomycin and tolerates it without any problems.  He states the diarrhea is improved.  He denies abdominal pain, nausea or vomiting.    DM:pt's DM remains stable.  Pt denies polyuria, polydipsia, polyphagia, changes in vision or hypoglycemic episodes.  No complications noted from the medication presently being used.  Last hemoglobin A1c is:  A recent  hemoglobin A1C is not available.    PAST MEDICAL HISTORY : Reviewed.  No changes.  CURRENT MEDICATIONS: Reviewed per [facility]  REVIEW OF SYSTEMS:  GENERAL: no change in appetite, no fatigue, no weight changes, no fever, chills or weakness RESPIRATORY: no cough, SOB, DOE, wheezing, hemoptysis CARDIAC: no chest pain, palpitations, complains of bilateral lower extremity swelling  GI: no abdominal pain, diarrhea, constipation, heart burn, nausea or vomiting  PHYSICAL EXAMINATION  VS:  T 98.1      P 82     RR 20     BP 126/87    POX %     WT (Lb) 177.6  GENERAL: no acute distress, normal body habitus EYES: conjunctivae normal, sclerae normal, normal eye lids NECK: supple, trachea midline, no neck masses, no thyroid tenderness, no thyromegaly LYMPHATICS: no LAN in the neck, no supraclavicular LAN RESPIRATORY: breathing is even & unlabored, BS  CTAB CARDIAC: RRR, no murmur,no extra heart sounds EDEMA/VARICOSITIES:  +2 bilateral lower extremity edema  ARTERIAL:   pedal pulses nonpalpable, right lower extremity in a brace GI: abdomen soft, normal BS, no masses, no tenderness, no hepatomegaly, no splenomegaly PSYCHIATRIC: the patient is alert & oriented to person, affect & behavior appropriate  LABS/RADIOLOGY: 10/19/2012:  Glucose 203, otherwise BMP normal.    10/12/2012:  Hemoglobin 11.6, MCV 88.8, otherwise CBC normal.    09/2012:  Glucose 221, total protein 4.1, albumin 2.4, otherwise CMP normal.    ASSESSMENT/PLAN:  C.difficile colitis.  Continue vancomycin.  Symptoms are improved.   Right ankle fracture.  Status post ORIF.  Continue rehabilitation.  Diabetes mellitus.  Check  hemoglobin A1C.    Hypertension.  Well controlled.   Bilateral lower extremity edema.  He is on Lasix.    Hyperlipidemia.  Continue Zocor.  Check fasting lipid panel.   GERD.  Well controlled.    CPT CODE: 46962

## 2012-11-09 DIAGNOSIS — Z96619 Presence of unspecified artificial shoulder joint: Secondary | ICD-10-CM | POA: Diagnosis not present

## 2012-11-12 ENCOUNTER — Non-Acute Institutional Stay (SKILLED_NURSING_FACILITY): Payer: Medicare Other | Admitting: Internal Medicine

## 2012-11-12 DIAGNOSIS — A0472 Enterocolitis due to Clostridium difficile, not specified as recurrent: Secondary | ICD-10-CM | POA: Diagnosis not present

## 2012-11-12 DIAGNOSIS — R609 Edema, unspecified: Secondary | ICD-10-CM

## 2012-11-15 ENCOUNTER — Non-Acute Institutional Stay (SKILLED_NURSING_FACILITY): Payer: Medicare Other | Admitting: Adult Health

## 2012-11-15 DIAGNOSIS — A0472 Enterocolitis due to Clostridium difficile, not specified as recurrent: Secondary | ICD-10-CM | POA: Diagnosis not present

## 2012-11-15 DIAGNOSIS — M75120 Complete rotator cuff tear or rupture of unspecified shoulder, not specified as traumatic: Secondary | ICD-10-CM

## 2012-11-15 DIAGNOSIS — K219 Gastro-esophageal reflux disease without esophagitis: Secondary | ICD-10-CM | POA: Diagnosis not present

## 2012-11-15 DIAGNOSIS — R634 Abnormal weight loss: Secondary | ICD-10-CM

## 2012-11-15 DIAGNOSIS — I10 Essential (primary) hypertension: Secondary | ICD-10-CM

## 2012-11-15 DIAGNOSIS — M75122 Complete rotator cuff tear or rupture of left shoulder, not specified as traumatic: Secondary | ICD-10-CM

## 2012-11-15 DIAGNOSIS — E785 Hyperlipidemia, unspecified: Secondary | ICD-10-CM

## 2012-11-17 NOTE — Progress Notes (Signed)
Patient ID: Logan Bean, male   DOB: 07-04-1921, 79 y.o.   MRN: [id number]          PROGRESS NOTE  DATE:  11/12/2012  FACILITY: [facility]   LEVEL OF CARE:   SNF   Acute Visit   CHIEF COMPLAINT:   Review of medical issues, including recurrent diarrhea.    HISTORY OF PRESENT ILLNESS:  Logan Bean is a 79 year-old patient who came here after a bout of severe pseudomembranous colitis, also ankle fracture for which he is undergoing PT.  He completed initial treatment for pseudomembranous colitis and then he had a recurrence which we again managed with two weeks' worth of vancomycin.  The last round of vancomycin finished around the 20th of April and the patient was reasonably asymptomatic for nine days.  However, he is concerned now about the change in diarrhea pattern and has noted 2-3 liquid bowel movements yesterday, none so far today, although the nurse reports this was formed.  There are no complaints of abdominal pain, nausea,  vomiting or fever.  However, the patient has remained reasonably anorexic and complains of nausea without vomiting.    REVIEW OF SYSTEMS:   CHEST/RESPIRATORY:   No shortness of breath.  CARDIAC:   No chest pain.   GI:  See HPI.  In particular, the bowel movements have been increasing in frequency, perhaps less consistency to them, although they still are formed.   MUSCULOSKELETAL:   Extremities:  Noted for severe edema earlier this admission.  We started him on Lasix.  However, his Lasix is now resulting in complete resolution of the edema and the patient is requesting changing this to p.r.n.       PHYSICAL EXAMINATION:   GENERAL APPEARANCE:  The patient looks somewhat weak, but in no distress.  Is coherent and conversational.   CHEST/RESPIRATORY:  Clear air entry bilaterally.  CARDIOVASCULAR:  CARDIAC:   Heart sounds are normal.  There are no murmurs.   GASTROINTESTINAL:  ABDOMEN:   Slightly distended.  Bowel sounds are positive.   LIVER/SPLEEN/KIDNEYS:   No liver, no spleen.  No tenderness in any quadrant.   CIRCULATION:   EDEMA/VARICOSITIES:  Minimal to no edema.  Some venous stasis.   MUSCULOSKELETAL:   EXTREMITIES:   BILATERAL LOWER EXTREMITIES:  Significant osteoarthritis with ligamentous instability of the left greater than right knee.  I have talked about this with Therapy.  He may need a brace on this knee.    ASSESSMENT/PLAN:  Recurrent diarrhea.  Question recurrent C.diff.  This is also a vexing situation as we are still having some formed stool.  I have elected to go ahead and test the stool rather than initiating a protracted period of oral vancomycin or the alternatives at this point.  I will check his electrolytes again early next week.  If he is having 3 or more liquid stools per day, will test stool  Edema.  I have changed the Lasix to p.r.n.     Severe osteoarthritis of the left knee.  I think anything that could make him more functional would be useful.  A brace to this end would be acceptable.     CPT CODE: 9930 9

## 2012-11-18 ENCOUNTER — Non-Acute Institutional Stay (SKILLED_NURSING_FACILITY): Payer: Medicare Other | Admitting: Internal Medicine

## 2012-11-18 DIAGNOSIS — A0472 Enterocolitis due to Clostridium difficile, not specified as recurrent: Secondary | ICD-10-CM | POA: Diagnosis not present

## 2012-11-18 DIAGNOSIS — E876 Hypokalemia: Secondary | ICD-10-CM

## 2012-11-18 DIAGNOSIS — R635 Abnormal weight gain: Secondary | ICD-10-CM | POA: Diagnosis not present

## 2012-11-18 DIAGNOSIS — E079 Disorder of thyroid, unspecified: Secondary | ICD-10-CM | POA: Diagnosis not present

## 2012-12-09 NOTE — Progress Notes (Signed)
Patient ID: Logan Bean, male   DOB: Apr 26, 1921, 79 y.o.   MRN: [id number]        PROGRESS NOTE  DATE: 11/18/2012  FACILITY:  [facility]  LEVEL OF CARE: SNF (31)  Acute Visit  CHIEF COMPLAINT:  Manage weight gain and C.difficile colitis.   HISTORY OF PRESENT ILLNESS: I was requested by the staff to assess the patient regarding above problem(s):  WEIGHT GAIN:  Dietitian reports that the patient has had a significant weight gain in seven days, which is 5 lbs.  Current body weight is 154.6 on  11/16/2012.  Patient has had a significant decrease in p.o. intake.  His Lasix was changed to p.r.n.  Patient is complaining of increased lower extremity swelling and a feeling of being bloated.  Denies shortness of breath.    C. DIFFICILE COLITIS: On 11/17/2012, patient's stool tested positive for C. difficile toxin x2.  He is complaining of abdominal tightness and diarrhea.  Patient denies ongoing nausea or vomiting.    PAST MEDICAL HISTORY : Reviewed.  No changes.  CURRENT MEDICATIONS: Reviewed per [facility]  REVIEW OF SYSTEMS:  GENERAL: no change in appetite, no fatigue, no weight changes, no fever, chills or weakness RESPIRATORY: no cough, SOB, DOE,, wheezing, hemoptysis CARDIAC: no chest pain or palpitations; increasing lower extremity swelling  GI: abdominal tightness and diarrhea present; no abdominal pain, constipation, heart burn, nausea or vomiting  PHYSICAL EXAMINATION  GENERAL: no acute distress, normal body habitus EYES: conjunctivae normal, sclerae normal, normal eye lids NECK: supple, trachea midline, no neck masses, no thyroid tenderness, no thyromegaly LYMPHATICS: no LAN in the neck, no supraclavicular LAN RESPIRATORY: breathing is even & unlabored, BS CTAB CARDIAC: RRR, no murmur,no extra heart sounds  EDEMA/VARICOSITIES:  +1 bilateral lower extremity edema  ARTERIAL:  pedal pulses +1  GI: abdomen soft, bowel sounds diminished, no masses, mild  diffuse tenderness to palpation, no hepatomegaly, no splenomegaly PSYCHIATRIC: the patient is alert & oriented to person, affect & behavior appropriate  LABS/RADIOLOGY: 11/16/2012:  Glucose 185, total protein 5.1, albumin 3, otherwise CMP normal.    10/2012:  Free T4 1.62, free T3 3.5, TSH 0.262.   ASSESSMENT/PLAN:  Weight gain.  New problem.  Check BNP.  Start Lasix 40 mg q.d. for four days, then change to p.r.n.   Hypokalemia.  Start KCl 40 mEq q.d. for four days only.    C.difficile colitis.  New problem.  Started on vancomycin 125 mg q.d. for one month and Florastor 2 caps b.i.d.    Decreased TSH.  Free T4 and T3 are normal.    CPT CODE: 78295

## 2012-12-10 ENCOUNTER — Non-Acute Institutional Stay (SKILLED_NURSING_FACILITY): Payer: Medicare Other | Admitting: Internal Medicine

## 2012-12-10 DIAGNOSIS — A0472 Enterocolitis due to Clostridium difficile, not specified as recurrent: Secondary | ICD-10-CM

## 2012-12-10 NOTE — Progress Notes (Signed)
Patient ID: Logan Bean, male   DOB: 09/25/1920, 79 y.o.   MRN: [id number] Chief complaint; 79 year old man with recurrent pseudomembranous colitis.  History; when I last saw this patient through weeks ago , he was having intermittent loose and formed stool. Unfortunately, this deteriorated into diarrhea, more than 3 per 24 hours. Once again, liquid stool was sent for assay tested. C. difficile toxin positive. I have put him on a protracted 6 weeks taper of oral vancomycin. We are now roughly 3 weeks into this and the patient is once again having formed stool with no abdominal pain, nausea, vomiting, or anorexia. Last lab work was from 55, showing an albumin of 3, sodium of 134, potassium of 5.1, BUN and creatinine are normal. I believe the patient is on daily, Lasix as of last week for lower extremity edema. Otherwise, the patient is doing well and again, per the patient and nursing is having formed stool.  Review of systems Respiratory no shortness of breath. Cardiac no chest pain. GI no dysphagia, nausea, vomiting, or abdominal pain.   Physical exam; Cardiac heart sounds are normal. No dehydration. Abdomen is soft. Bowel sounds are positive. No tenderness.  Impression/plan Recurrent pseudomembranous colitis; including the initial event. This is now. The third relapse. And this is the reason for the prolonged course of vancomycin. Patient is improved. I will check electrolytes next week. I don't believe there is any reason for continued isolation.

## 2012-12-14 DIAGNOSIS — Z9889 Other specified postprocedural states: Secondary | ICD-10-CM | POA: Diagnosis not present

## 2012-12-14 DIAGNOSIS — IMO0001 Reserved for inherently not codable concepts without codable children: Secondary | ICD-10-CM | POA: Diagnosis not present

## 2012-12-21 ENCOUNTER — Non-Acute Institutional Stay (SKILLED_NURSING_FACILITY): Payer: Medicare Other | Admitting: Internal Medicine

## 2012-12-21 DIAGNOSIS — S8290XS Unspecified fracture of unspecified lower leg, sequela: Secondary | ICD-10-CM

## 2012-12-21 DIAGNOSIS — I10 Essential (primary) hypertension: Secondary | ICD-10-CM

## 2012-12-21 DIAGNOSIS — S82841S Displaced bimalleolar fracture of right lower leg, sequela: Secondary | ICD-10-CM

## 2012-12-21 DIAGNOSIS — R634 Abnormal weight loss: Secondary | ICD-10-CM | POA: Diagnosis not present

## 2012-12-21 DIAGNOSIS — E119 Type 2 diabetes mellitus without complications: Secondary | ICD-10-CM

## 2012-12-21 NOTE — Progress Notes (Signed)
Patient ID: Logan Bean, male   DOB: 1920-08-12, 79 y.o.   MRN: [id number]        PROGRESS NOTE  DATE:  12/21/2012  FACILITY: [facility]   LEVEL OF CARE: SNF  Routine Visit  CHIEF COMPLAINT:  Manage weight loss, diabetes mellitus and right ankle fracture.  HISTORY OF PRESENT ILLNESS:    REASSESSMENT OF ONGOING PROBLEM(S):    RIGHT ANKLE FRACTURE:  The patient had a closed bimalleolar fracture of right ankle.  He underwent ORIF and then admitted to this facility for short-term rehabilitation.  He  currently wears a right lower extremity brace.  He denies right ankle pain.    WEIGHT LOSS: Dietitian reports that patient has had a significant weight loss in 7 days which is 7.6 pounds. Current body weight is 148 pounds. Patient is currently on Lasix and the weight loss is thought to be secondary to fluid loss. Patient does admit to weight loss. He denies lack of appetite.  DM:pt's DM remains stable.  Pt denies polyuria, polydipsia, polyphagia, changes in vision or hypoglycemic episodes.  No complications noted from the medication presently being used.  Last hemoglobin A1c is:  6.8 in 4/14.    PAST MEDICAL HISTORY : Reviewed.  No changes.  CURRENT MEDICATIONS: Reviewed per [facility]  REVIEW OF SYSTEMS:  GENERAL: no change in appetite, no fatigue, no weight changes, no fever, chills or weakness RESPIRATORY: no cough, SOB, DOE, wheezing, hemoptysis CARDIAC: no chest pain, palpitations, complains of bilateral lower extremity swelling  GI: no abdominal pain, diarrhea, constipation, heart burn, nausea or vomiting  PHYSICAL EXAMINATION  VS:  T 97.5     P 82     RR 18     BP 113/73    POX %     WT (Lb) 148  GENERAL: no acute distress, normal body habitus EYES: conjunctivae normal, sclerae normal, normal eye lids NECK: supple, trachea midline, no neck masses, no thyroid tenderness, no thyromegaly LYMPHATICS: no LAN in the neck, no supraclavicular LAN RESPIRATORY: breathing is even &  unlabored, BS CTAB CARDIAC: RRR, no murmur,no extra heart sounds EDEMA/VARICOSITIES:  +1 bilateral lower extremity edema  ARTERIAL:   pedal pulses nonpalpable GI: abdomen soft, normal BS, no masses, no tenderness, no hepatomegaly, no splenomegaly PSYCHIATRIC: the patient is alert & oriented to person, affect & behavior appropriate  LABS/RADIOLOGY:  5/14 glucose 185, BUN 30, total protein 5.1, albumin 3 otherwise CMP normal 10/28/12 hemoglobin 10.7, MCV 85.5 otherwise CBC normal, fasting lipid panel normal, free T4-1.62, free T3-3.5, TSH 0.262 10/19/2012:  Glucose 203, otherwise BMP normal.    10/12/2012:  Hemoglobin 11.6, MCV 88.8, otherwise CBC normal.    09/2012:  Glucose 221, total protein 4.1, albumin 2.4, otherwise CMP normal.    ASSESSMENT/PLAN:  Weight loss-new problem. Likely secondary to fluid loss. Place on weekly weights.  Right ankle fracture.  Status post ORIF.  Continue rehabilitation.  Diabetes mellitus.  Well controlled.  Hypertension.  Well controlled.   Bilateral lower extremity edema.  He is on Lasix.    Hyperlipidemia.  Continue Zocor.  We'll control.  GERD.  Well controlled.   CPT CODE: 78295

## 2012-12-29 ENCOUNTER — Encounter: Payer: Self-pay | Admitting: Adult Health

## 2012-12-29 DIAGNOSIS — E785 Hyperlipidemia, unspecified: Secondary | ICD-10-CM | POA: Insufficient documentation

## 2012-12-29 DIAGNOSIS — K219 Gastro-esophageal reflux disease without esophagitis: Secondary | ICD-10-CM | POA: Insufficient documentation

## 2012-12-29 DIAGNOSIS — R634 Abnormal weight loss: Secondary | ICD-10-CM | POA: Insufficient documentation

## 2012-12-29 MED ORDER — MIRTAZAPINE 7.5 MG PO TABS: 7.5000 mg | ORAL_TABLET | Freq: Every day | ORAL | Status: DC

## 2012-12-29 NOTE — Assessment & Plan Note (Signed)
Due to his poor appetite and weight loss; will stop the zocor and fish oil and at this time will continue to monitor his status

## 2012-12-29 NOTE — Assessment & Plan Note (Signed)
Will continue vicodin 5/325 mg 1-2 tabs every 6 hours as needed and robaxin 500 mg three times daily as needed

## 2012-12-29 NOTE — Assessment & Plan Note (Signed)
He has poor appetite will begin remeron 7.5 mg nightly for 45 days and will continue to monitor his status

## 2012-12-29 NOTE — Assessment & Plan Note (Signed)
He has completed the vancomycin therapy will continue his florastor at this time. Will continue to monitor his status

## 2012-12-29 NOTE — Assessment & Plan Note (Signed)
Will continue prilosec 20 mg daily  

## 2012-12-29 NOTE — Progress Notes (Signed)
Patient ID: Logan Bean, male   DOB: 12/19/1920, 79 y.o.   MRN: [id number]  [facility]  Allergies  Allergen Reactions  . Morphine And Related Nausea And Vomiting  . Oxycodone Nausea And Vomiting     Chief Complaint  Patient presents with  . Medical Managment of Chronic Issues    HPI: He is being seen for the management of his chronic illnesses. He has recently been treated for a c-diff infection. He is complaining of a poor appetite he has lost weight. Nursing is concerned about his weight loss.   Past Medical History  Diagnosis Date  . Hypertension   . Hypercholesteremia   . Stomach ulcer   . H/O hiatal hernia   . Varicose veins     both legs  . Arthritis     left knee  . Diabetes mellitus     diet controlled, pt does not check cbg  . Left rotator cuff tear Jul 12, 2012    Past Surgical History  Procedure Laterality Date  . Esopagus strectching  2003 and 3 yrs ago  . Tonsillectomy  age 38  . Shoulder open rotator cuff repair  11/05/2011    Procedure: ROTATOR CUFF REPAIR SHOULDER OPEN;  Surgeon: Jacki Cones, MD;  Location: [facility];  Service: Orthopedics;  Laterality: Left;  Left Shoulder Rotator Cuff Repair Open with Graft and Anchors  . Reverse shoulder arthroplasty Right 09/17/2012    Procedure: REVERSE SHOULDER ARTHROPLASTY;  Surgeon: Verlee Rossetti, MD;  Location: [facility] OR;  Service: Orthopedics;  Laterality: Right;  . Orif ankle fracture Right 09/20/2012    Procedure: OPEN REDUCTION INTERNAL FIXATION (ORIF) ANKLE FRACTURE;  Surgeon: Javier Docker, MD;  Location: [facility];  Service: Orthopedics;  Laterality: Right;    VITAL SIGNS BP 126/87  Pulse 82  Ht 5\' 5"  (1.651 m)  Wt 177 lb (80.287 kg)  BMI 29.45 kg/m2   Patient's Medications  New Prescriptions   No medications on file  Previous Medications   ACETAMINOPHEN (TYLENOL) 650 MG CR TABLET    Take 650 mg by mouth daily as needed (joint pain).   AMLODIPINE (NORVASC) 5 MG TABLET    Take 5 mg by mouth at  bedtime.    ASPIRIN 81 MG TABLET    Take 1 tablet (81 mg total) by mouth at bedtime.   FUROSEMIDE (LASIX) 40 MG TABLET    Take 40 mg by mouth daily as needed.   HYDROCODONE-ACETAMINOPHEN (NORCO) 5-325 MG PER TABLET    Take 1 tablet by mouth every 6 (six) hours as needed for pain.   HYDROCODONE-ACETAMINOPHEN (NORCO) 5-325 MG PER TABLET    Take 1 tablet every 6 hours as needed. Take 2 tablets every 6 hours as needed.   METHOCARBAMOL (ROBAXIN) 500 MG TABLET    Take 1 tablet (500 mg total) by mouth 3 (three) times daily as needed.   MULTIPLE VITAMIN (MULITIVITAMIN WITH MINERALS) TABS    Take 1 tablet by mouth daily.   OMEGA-3 FATTY ACIDS (FISH OIL) 1200 MG CAPS    Take 1,200 mg by mouth daily.   OMEPRAZOLE (PRILOSEC) 20 MG CAPSULE    Take 20 mg by mouth daily before breakfast.   POTASSIUM CHLORIDE SA (K-DUR,KLOR-CON) 20 MEQ TABLET    Take 40 mEq by mouth daily as needed.   SACCHAROMYCES BOULARDII (FLORASTOR) 250 MG CAPSULE    Take 1 capsule (250 mg total) by mouth 2 (two) times daily.   SIMVASTATIN (ZOCOR) 20 MG TABLET  Take 20 mg by mouth at bedtime.   Modified Medications   No medications on file  Discontinued Medications   VANCOMYCIN (VANCOCIN) 50 MG/ML ORAL SOLUTION    Take 2.5 mLs (125 mg total) by mouth every 6 (six) hours. Take for 9 additional days    SIGNIFICANT DIAGNOSTIC EXAMS     Component Value Date/Time   ALBUMIN 1.8* 09/23/2012 0450   AST 52* 09/23/2012 0450   ALT 40 09/23/2012 0450   ALKPHOS 92 09/23/2012 0450   BILITOT 0.6 09/23/2012 0450       Component Value Date/Time   BUN 47* 09/29/2012 0415   GLUCOSE 200* 09/29/2012 0415   CREATININE 0.91 09/29/2012 0415   K 4.4 09/29/2012 0415   NA 133* 09/29/2012 0415       Component Value Date/Time   WBC 15.6* 09/29/2012 0415   RBC 3.72* 09/29/2012 0415   HGB 10.7* 09/29/2012 0415   HCT 32.2* 09/29/2012 0415   PLT 546* 09/29/2012 0415   MCV 86.6 09/29/2012 0415   10-19-12: c-diff: pos: vanc 10-21-12: tsh 0.262 10-22-12: bnp:  407.8 10-28-12: wbc 10.2; hgb 10.7; hct 33.1; mcv 85.5 ;plt 304; glucose 176; bun 15; creat0.74; k+ 4.1 Na++ 141; liver normal albumin 3.0; chol 142; ldl 77 trig 125; free t4 1.62; free t3 3.5 hgb a1c 6.8  Review of Systems  Constitutional: Positive for weight loss.       Has poor appetite from having c-diff infection   Respiratory: Negative for cough and shortness of breath.   Cardiovascular: Negative for chest pain and leg swelling.  Gastrointestinal: Negative for heartburn, abdominal pain, diarrhea and constipation.  Musculoskeletal: Negative for myalgias and joint pain.  Skin: Negative.   Neurological: Negative for headaches.  Psychiatric/Behavioral: Negative for depression. The patient does not have insomnia.     Physical Exam  Constitutional:  Thin   Neck: Neck supple. No thyromegaly present.  Cardiovascular: Normal rate, regular rhythm and intact distal pulses.   Respiratory: Effort normal and breath sounds normal.  GI: Soft. Bowel sounds are normal. He exhibits no distension. There is no tenderness.  Musculoskeletal: He exhibits no edema.  Left arm in sling; able to move extremities  Neurological: He is alert.  Skin: Skin is warm and dry.       ASSESSMENT/ PLAN:  C. difficile colitis He has completed the vancomycin therapy will continue his florastor at this time. Will continue to monitor his status   HTN (hypertension) Is stable will continue norvasc 5 mg daily; asa 81 mg daily and will monitor   Dyslipidemia Due to his poor appetite and weight loss; will stop the zocor and fish oil and at this time will continue to monitor his status   GERD (gastroesophageal reflux disease) Will continue prilosec 20 mg daily   Complete tear of left rotator cuff Will continue vicodin 5/325 mg 1-2 tabs every 6 hours as needed and robaxin 500 mg three times daily as needed   Loss of weight He has poor appetite will begin remeron 7.5 mg nightly for 45 days and will continue to  monitor his status

## 2012-12-29 NOTE — Assessment & Plan Note (Addendum)
Is stable will continue norvasc 5 mg daily; asa 81 mg daily and will monitor

## 2012-12-30 ENCOUNTER — Other Ambulatory Visit: Payer: Self-pay | Admitting: Orthopedic Surgery

## 2012-12-30 DIAGNOSIS — IMO0001 Reserved for inherently not codable concepts without codable children: Secondary | ICD-10-CM | POA: Diagnosis not present

## 2012-12-30 DIAGNOSIS — R918 Other nonspecific abnormal finding of lung field: Secondary | ICD-10-CM

## 2012-12-31 ENCOUNTER — Other Ambulatory Visit: Payer: Self-pay | Admitting: Orthopedic Surgery

## 2012-12-31 ENCOUNTER — Ambulatory Visit
Admission: RE | Admit: 2012-12-31 | Discharge: 2012-12-31 | Disposition: A | Discharge status: Home / Self Care | Payer: Medicare Other | Source: Ambulatory Visit | Attending: Orthopedic Surgery | Admitting: Orthopedic Surgery

## 2012-12-31 DIAGNOSIS — R918 Other nonspecific abnormal finding of lung field: Secondary | ICD-10-CM

## 2012-12-31 DIAGNOSIS — J9819 Other pulmonary collapse: Secondary | ICD-10-CM | POA: Diagnosis not present

## 2012-12-31 DIAGNOSIS — J90 Pleural effusion, not elsewhere classified: Secondary | ICD-10-CM | POA: Diagnosis not present

## 2012-12-31 MED ORDER — IOHEXOL 300 MG/ML  SOLN
75.0000 mL | Freq: Once | INTRAMUSCULAR | Status: AC | PRN
  Administered 2012-12-31: 75 mL via INTRAVENOUS

## 2013-01-04 ENCOUNTER — Non-Acute Institutional Stay (SKILLED_NURSING_FACILITY): Payer: Medicare Other | Admitting: Internal Medicine

## 2013-01-04 DIAGNOSIS — Z79899 Other long term (current) drug therapy: Secondary | ICD-10-CM

## 2013-01-04 DIAGNOSIS — J90 Pleural effusion, not elsewhere classified: Secondary | ICD-10-CM | POA: Diagnosis not present

## 2013-01-04 DIAGNOSIS — J810 Acute pulmonary edema: Secondary | ICD-10-CM

## 2013-01-04 DIAGNOSIS — R609 Edema, unspecified: Secondary | ICD-10-CM

## 2013-01-04 DIAGNOSIS — E876 Hypokalemia: Secondary | ICD-10-CM | POA: Diagnosis not present

## 2013-01-05 ENCOUNTER — Encounter: Payer: Self-pay | Admitting: Adult Health

## 2013-01-05 ENCOUNTER — Non-Acute Institutional Stay (SKILLED_NURSING_FACILITY): Payer: Medicare Other | Admitting: Adult Health

## 2013-01-05 DIAGNOSIS — M75122 Complete rotator cuff tear or rupture of left shoulder, not specified as traumatic: Secondary | ICD-10-CM

## 2013-01-05 DIAGNOSIS — A0472 Enterocolitis due to Clostridium difficile, not specified as recurrent: Secondary | ICD-10-CM

## 2013-01-05 DIAGNOSIS — S8290XS Unspecified fracture of unspecified lower leg, sequela: Secondary | ICD-10-CM | POA: Diagnosis not present

## 2013-01-05 DIAGNOSIS — S82891S Other fracture of right lower leg, sequela: Secondary | ICD-10-CM

## 2013-01-05 DIAGNOSIS — M75120 Complete rotator cuff tear or rupture of unspecified shoulder, not specified as traumatic: Secondary | ICD-10-CM | POA: Diagnosis not present

## 2013-01-05 NOTE — Progress Notes (Signed)
Patient ID: Logan Bean, male   DOB: 11/22/1920, 79 y.o.   MRN: [id number]  [facility]  Allergies  Allergen Reactions  . Morphine And Related Nausea And Vomiting  . Oxycodone Nausea And Vomiting     Chief Complaint  Patient presents with  . Discharge Note    HPI: He had been hospitalized for a right ankle fracture and left rotator cuff tear; he also had c-dff infection; he was admitted here for short term rehab and is ready to be discharged home; he will need a bedside commode; and prescriptions written he will need home health for pt/ot/nursing and cna.   Past Medical History  Diagnosis Date  . Hypertension   . Hypercholesteremia   . Stomach ulcer   . H/O hiatal hernia   . Varicose veins     both legs  . Arthritis     left knee  . Diabetes mellitus     diet controlled, pt does not check cbg  . Left rotator cuff tear Jul 12, 2012    Past Surgical History  Procedure Laterality Date  . Esopagus strectching  2003 and 3 yrs ago  . Tonsillectomy  age 78  . Shoulder open rotator cuff repair  11/05/2011    Procedure: ROTATOR CUFF REPAIR SHOULDER OPEN;  Surgeon: Jacki Cones, MD;  Location: [facility];  Service: Orthopedics;  Laterality: Left;  Left Shoulder Rotator Cuff Repair Open with Graft and Anchors  . Reverse shoulder arthroplasty Right 09/17/2012    Procedure: REVERSE SHOULDER ARTHROPLASTY;  Surgeon: Verlee Rossetti, MD;  Location: [facility] OR;  Service: Orthopedics;  Laterality: Right;  . Orif ankle fracture Right 09/20/2012    Procedure: OPEN REDUCTION INTERNAL FIXATION (ORIF) ANKLE FRACTURE;  Surgeon: Javier Docker, MD;  Location: [facility];  Service: Orthopedics;  Laterality: Right;    VITAL SIGNS BP 108/56  Pulse 70  Ht 5\' 5"  (1.651 m)  Wt 154 lb 12.8 oz (70.217 kg)  BMI 25.76 kg/m2   Patient's Medications  New Prescriptions   No medications on file  Previous Medications   ACETAMINOPHEN (TYLENOL) 650 MG CR TABLET    Take 650 mg by mouth daily as needed (joint  pain).   ASPIRIN 81 MG TABLET    Take 1 tablet (81 mg total) by mouth at bedtime.   FUROSEMIDE (LASIX) 40 MG TABLET    Take 40 mg by mouth daily.    METHOCARBAMOL (ROBAXIN) 500 MG TABLET    Take 1 tablet (500 mg total) by mouth 3 (three) times daily as needed.   MIRTAZAPINE (REMERON) 7.5 MG TABLET    Take 1 tablet (7.5 mg total) by mouth at bedtime.   MULTIPLE VITAMIN (MULITIVITAMIN WITH MINERALS) TABS    Take 1 tablet by mouth daily.   OMEPRAZOLE (PRILOSEC) 20 MG CAPSULE    Take 20 mg by mouth daily before breakfast.   POTASSIUM CHLORIDE SA (K-DUR,KLOR-CON) 20 MEQ TABLET    Take 20 mEq by mouth daily.    SACCHAROMYCES BOULARDII (FLORASTOR) 250 MG CAPSULE    Take 1 capsule (250 mg total) by mouth 2 (two) times daily.  Modified Medications   Modified Medication Previous Medication   HYDROCODONE-ACETAMINOPHEN (NORCO/VICODIN) 5-325 MG PER TABLET HYDROcodone-acetaminophen (NORCO) 5-325 MG per tablet      Take 1-2 tablets by mouth every 6 (six) hours as needed. Take 1 tablet every 6 hours as needed. Take 2 tablets every 6 hours as needed.    Take 1 tablet every 6 hours as  needed. Take 2 tablets every 6 hours as needed.  Discontinued Medications   AMLODIPINE (NORVASC) 5 MG TABLET    Take 5 mg by mouth at bedtime.    HYDROCODONE-ACETAMINOPHEN (NORCO) 5-325 MG PER TABLET    Take 1 tablet by mouth every 6 (six) hours as needed for pain.    SIGNIFICANT DIAGNOSTIC EXAMS     Component Value Date/Time   ALBUMIN 1.8* 09/23/2012 0450   AST 52* 09/23/2012 0450   ALT 40 09/23/2012 0450   ALKPHOS 92 09/23/2012 0450   BILITOT 0.6 09/23/2012 0450       Component Value Date/Time   BUN 47* 09/29/2012 0415   GLUCOSE 200* 09/29/2012 0415   CREATININE 0.91 09/29/2012 0415   K 4.4 09/29/2012 0415   NA 133* 09/29/2012 0415       Component Value Date/Time   WBC 15.6* 09/29/2012 0415   RBC 3.72* 09/29/2012 0415   HGB 10.7* 09/29/2012 0415   HCT 32.2* 09/29/2012 0415   PLT 546* 09/29/2012 0415   MCV 86.6 09/29/2012  0415    10-19-12: c-diff: pos: vanc 10-21-12: tsh 0.262 10-22-12: bnp: 407.8 10-28-12: wbc 10.2; hgb 10.7; hct 33.1; mcv 85.5 ;plt 304; glucose 176; bun 15; creat0.74; k+ 4.1 Na++ 141; liver normal albumin 3.0; chol 142; ldl 77 trig 125; free t4 1.62; free t3 3.5 hgb a1c 6.8 11-16-12: glucose 185; bun 30; creat 0 .96; k+ 51; na++ 134; liver normal albumin 3.0 11-17-12: stool: +c diff 11-25-12: bnp: 351.1    Review of Systems  Constitutional: Negative for malaise/fatigue.  Respiratory: Negative for cough and shortness of breath.   Cardiovascular: Negative for chest pain and leg swelling.  Gastrointestinal: Negative for heartburn, abdominal pain, diarrhea and constipation.  Musculoskeletal: Negative for myalgias and joint pain.  Skin: Negative.   Neurological: Negative for headaches.  Psychiatric/Behavioral: Negative for depression. The patient does not have insomnia.    Physical Exam  Constitutional: He is oriented to person, place, and time.  thin  Neck: Neck supple. No JVD present. No thyromegaly present.  Cardiovascular: Normal rate, regular rhythm and intact distal pulses.   Respiratory: Effort normal and breath sounds normal. No respiratory distress.  GI: Soft. Bowel sounds are normal. He exhibits no distension. There is no tenderness.  Musculoskeletal: Normal range of motion. He exhibits no edema.  Neurological: He is alert and oriented to person, place, and time.  Skin: Skin is warm and dry.      ASSESSMENT/ PLAN:  Right ankle fracture; left rotator cuff tear; c-diff   Will discharge to home with home health for pt/ot/nursing/cna; will need a bedside commode and prescriptions have been written  Time spent with patient: 40 minutes.

## 2013-01-10 ENCOUNTER — Encounter: Payer: Self-pay | Admitting: *Deleted

## 2013-01-10 DIAGNOSIS — M159 Polyosteoarthritis, unspecified: Secondary | ICD-10-CM | POA: Diagnosis not present

## 2013-01-10 DIAGNOSIS — E119 Type 2 diabetes mellitus without complications: Secondary | ICD-10-CM | POA: Diagnosis not present

## 2013-01-10 DIAGNOSIS — R269 Unspecified abnormalities of gait and mobility: Secondary | ICD-10-CM

## 2013-01-10 DIAGNOSIS — IMO0001 Reserved for inherently not codable concepts without codable children: Secondary | ICD-10-CM

## 2013-01-10 DIAGNOSIS — I10 Essential (primary) hypertension: Secondary | ICD-10-CM | POA: Diagnosis not present

## 2013-01-11 ENCOUNTER — Telehealth: Payer: Self-pay | Admitting: *Deleted

## 2013-01-11 NOTE — Telephone Encounter (Signed)
Logan Bean is requesting a visit for PT twice a week. I told her verbally that was fine

## 2013-01-12 ENCOUNTER — Other Ambulatory Visit: Payer: Self-pay | Admitting: *Deleted

## 2013-01-12 ENCOUNTER — Ambulatory Visit (INDEPENDENT_AMBULATORY_CARE_PROVIDER_SITE_OTHER): Payer: Medicare Other | Admitting: Internal Medicine

## 2013-01-12 ENCOUNTER — Encounter: Payer: Self-pay | Admitting: Internal Medicine

## 2013-01-12 VITALS — BP 124/80 | HR 59 | Temp 98.3°F | Resp 18 | Ht 65.0 in | Wt 162.0 lb

## 2013-01-12 DIAGNOSIS — R0609 Other forms of dyspnea: Secondary | ICD-10-CM | POA: Diagnosis not present

## 2013-01-12 DIAGNOSIS — R609 Edema, unspecified: Secondary | ICD-10-CM

## 2013-01-12 DIAGNOSIS — K219 Gastro-esophageal reflux disease without esophagitis: Secondary | ICD-10-CM

## 2013-01-12 DIAGNOSIS — R21 Rash and other nonspecific skin eruption: Secondary | ICD-10-CM

## 2013-01-12 DIAGNOSIS — R0600 Dyspnea, unspecified: Secondary | ICD-10-CM

## 2013-01-12 DIAGNOSIS — E876 Hypokalemia: Secondary | ICD-10-CM

## 2013-01-12 DIAGNOSIS — I10 Essential (primary) hypertension: Secondary | ICD-10-CM

## 2013-01-12 DIAGNOSIS — E119 Type 2 diabetes mellitus without complications: Secondary | ICD-10-CM | POA: Diagnosis not present

## 2013-01-12 DIAGNOSIS — R0989 Other specified symptoms and signs involving the circulatory and respiratory systems: Secondary | ICD-10-CM

## 2013-01-12 MED ORDER — OMEPRAZOLE 20 MG PO CPDR: 20.0000 mg | DELAYED_RELEASE_CAPSULE | Freq: Every day | ORAL | Status: DC

## 2013-01-12 MED ORDER — FUROSEMIDE 40 MG PO TABS: 40.0000 mg | ORAL_TABLET | Freq: Two times a day (BID) | ORAL | Status: DC

## 2013-01-12 MED ORDER — LISINOPRIL 20 MG PO TABS: 20.0000 mg | ORAL_TABLET | Freq: Every day | ORAL | Status: DC

## 2013-01-12 MED ORDER — GLUCOSAMINE 1500 COMPLEX PO CAPS: ORAL_CAPSULE | ORAL | Status: DC

## 2013-01-12 MED ORDER — POTASSIUM CHLORIDE CRYS ER 20 MEQ PO TBCR: 20.0000 meq | EXTENDED_RELEASE_TABLET | Freq: Every day | ORAL | Status: DC

## 2013-01-12 MED ORDER — AMLODIPINE BESYLATE 5 MG PO TABS: ORAL_TABLET | ORAL | Status: DC

## 2013-01-12 NOTE — Patient Instructions (Signed)
i will see you in 1 week

## 2013-01-12 NOTE — Progress Notes (Signed)
Patient ID: Logan Bean, male   DOB: 10-Sep-1920, 79 y.o.   MRN: [id number]  Chief Complaint  Patient presents with  . Follow-up    excessive edema x3 month, rash-torso(front/back)x 1 wk   HPI 79 y/o male patient is here with his POA. He was in the hospital from 09/19/12- 09/29/12 with closed bimalleolar fracture of right ankle. He underwent ORIF and was sent to SNF for STR. He developed c.diff colits over there and had 2 course of treatment for it. He underwent intese rehab and then was discharged home a week back 01/05/13. He has been having increased swelling in his legs for past month. Today he notices his swelling to be increased. he is SOB with minimal exertion. No orthopnea or PND. No chest pain No cough reported He follows with Dr Ranell Patrick from orthopedic He has also noticed rash on his neck and torso area for a week.it is associated with pruritis He was discharged from facility with metformin for his diabetes but he felt he did not need it any more and has not taken it since discharge. He has not checked his blood sugar He has completed treatment for c.diff colitis with po vancomycin this Saturday. No further loose stool. No abdominal pain Feels weak and is using his walker Pain under control He has been getting PT/OT and RN services  Review of Systems  Constitutional: Positive for malaise/fatigue. Negative for fever and chills.  HENT: Negative for congestion and sore throat.   Respiratory: Positive for shortness of breath. Negative for cough and wheezing.   Cardiovascular: Positive for leg swelling. Negative for chest pain, palpitations, orthopnea and claudication.  Gastrointestinal: Negative for heartburn, nausea, vomiting and abdominal pain.  Genitourinary: Negative for dysuria.  Musculoskeletal: Negative for falls.  Skin: Positive for itching and rash.  Neurological: Positive for weakness. Negative for dizziness, focal weakness and headaches.  Psychiatric/Behavioral: Negative for  depression.   Allergies  Allergen Reactions  . Morphine And Related Nausea And Vomiting  . Oxycodone Nausea And Vomiting    Past Medical History  Diagnosis Date  . Hypertension   . Hypercholesteremia   . Stomach ulcer   . H/O hiatal hernia   . Varicose veins     both legs  . Arthritis     left knee  . Diabetes mellitus     diet controlled, pt does not check cbg  . Left rotator cuff tear Jul 12, 2012  . Edema   . Unspecified hereditary and idiopathic peripheral neuropathy   . Gout, unspecified   . Pain in joint, site unspecified   . Dizziness and giddiness   . Unspecified vitamin D deficiency   . Unspecified fall   . Other malaise and fatigue   . Diaphragmatic hernia without mention of obstruction or gangrene   . Other dysphagia    BP 124/80  Pulse 59  Temp(Src) 98.3 F (36.8 C) (Oral)  Resp 18  Ht 5\' 5"  (1.651 m)  Wt 162 lb (73.483 kg)  BMI 26.96 kg/m2  SpO2 95%  Physical Exam  Constitutional: He is oriented to person, place, and time. No distress.  Frail, elderly male, does not appear stated age  HENT:  Head: Normocephalic and atraumatic.  Mouth/Throat: Oropharynx is clear and moist.  Eyes: EOM are normal. Pupils are equal, round, and reactive to light.  Neck: Normal range of motion. Neck supple. JVD present. No tracheal deviation present. No thyromegaly present.  Cardiovascular: Normal rate, regular rhythm, normal heart sounds and intact distal  pulses.   No murmur heard. Pulmonary/Chest: Effort normal. No respiratory distress. He has no wheezes. He exhibits no tenderness.  Basilar decreased air entry  Abdominal: Soft. Bowel sounds are normal. He exhibits no distension.  Musculoskeletal: He exhibits edema.  2+ pitting edema upto thigh  Lymphadenopathy:    He has no cervical adenopathy.  Neurological: He is alert and oriented to person, place, and time.  Skin: Skin is warm. Rash noted. He is not diaphoretic. There is erythema.  Macular erythematous rash   Psychiatric: He has a normal mood and affect. His behavior is normal.   ASSESSMENT/PLAN  Dyspnea Reviewed prior cxr s/o loculated effusion. Given new worsening dyspnea, decreased air entry, o2 saturation with exertion checked and this is maintained at 95% on room air. Will get another cxr to assess for resolution of this effusion. Will also check pro- bnp to rule out chf exacerbation. No documented hx of chf. But given farely new dysonea with increased edema and weight, will rule out cardiac origin for edema. Also check lft to rule out hepatic etiology. Will get echocardiogram to assess EF and valvular function. After reviewing echocardiogram, will provide cardiology referral if needed  Edema Will increase his home dose lasix to 40 mg bid for now. Check bmp for renal function. Monitor daily weight  Rash- unclear reason for new rash limited to chest and torso. No new product used. No skin blisters or drainage. Will have him on prn benadryl 25 mg for now. Check cbc with diff to assess for eosinophilia. Also rule out hepatic abnormality if any  htn- bp well controlled. Continue current regimen of lisinopril and amlodipine  Hypokalemia- continue kcl supplement. Check bmp  gerd- continue prilosec and monitor clinically  DM- self stopped medication. Will check a1c and assess need for oral hypoglycemics  Labs- a1c, cmp, pro bnp, cxr, echocardiogram

## 2013-01-13 ENCOUNTER — Ambulatory Visit
Admission: RE | Admit: 2013-01-13 | Discharge: 2013-01-13 | Disposition: A | Discharge status: Home / Self Care | Payer: Medicare Other | Source: Ambulatory Visit | Attending: Internal Medicine | Admitting: Internal Medicine

## 2013-01-13 ENCOUNTER — Other Ambulatory Visit: Payer: Self-pay | Admitting: Geriatric Medicine

## 2013-01-13 DIAGNOSIS — IMO0001 Reserved for inherently not codable concepts without codable children: Secondary | ICD-10-CM | POA: Diagnosis not present

## 2013-01-13 DIAGNOSIS — R269 Unspecified abnormalities of gait and mobility: Secondary | ICD-10-CM | POA: Diagnosis not present

## 2013-01-13 DIAGNOSIS — R0600 Dyspnea, unspecified: Secondary | ICD-10-CM

## 2013-01-13 DIAGNOSIS — E119 Type 2 diabetes mellitus without complications: Secondary | ICD-10-CM | POA: Diagnosis not present

## 2013-01-13 DIAGNOSIS — R0602 Shortness of breath: Secondary | ICD-10-CM | POA: Diagnosis not present

## 2013-01-13 DIAGNOSIS — M159 Polyosteoarthritis, unspecified: Secondary | ICD-10-CM | POA: Diagnosis not present

## 2013-01-13 DIAGNOSIS — I10 Essential (primary) hypertension: Secondary | ICD-10-CM | POA: Diagnosis not present

## 2013-01-13 LAB — CBC WITH DIFFERENTIAL/PLATELET
Basophils Absolute: 0 10*3/uL (ref 0.0–0.2)
Basos: 1 % (ref 0–3)
Eos: 7 % — ABNORMAL HIGH (ref 0–5)
Eosinophils Absolute: 0.6 10*3/uL — ABNORMAL HIGH (ref 0.0–0.4)
HCT: 36.2 % — ABNORMAL LOW (ref 37.5–51.0)
Hemoglobin: 11.2 g/dL — ABNORMAL LOW (ref 12.6–17.7)
Immature Grans (Abs): 0 10*3/uL (ref 0.0–0.1)
Immature Granulocytes: 0 % (ref 0–2)
Lymphocytes Absolute: 1.2 10*3/uL (ref 0.7–3.1)
Lymphs: 14 % (ref 14–46)
MCH: 23.9 pg — ABNORMAL LOW (ref 26.6–33.0)
MCHC: 30.9 g/dL — ABNORMAL LOW (ref 31.5–35.7)
MCV: 77 fL — ABNORMAL LOW (ref 79–97)
Monocytes Absolute: 0.8 10*3/uL (ref 0.1–0.9)
Monocytes: 10 % (ref 4–12)
Neutrophils Absolute: 6 10*3/uL (ref 1.4–7.0)
Neutrophils Relative %: 68 % (ref 40–74)
RBC: 4.68 x10E6/uL (ref 4.14–5.80)
RDW: 17.4 % — ABNORMAL HIGH (ref 12.3–15.4)
WBC: 8.6 10*3/uL (ref 3.4–10.8)

## 2013-01-13 LAB — COMPREHENSIVE METABOLIC PANEL
Albumin: 3.5 g/dL (ref 3.2–4.6)
BUN/Creatinine Ratio: 35 — ABNORMAL HIGH (ref 10–22)
BUN: 39 mg/dL — ABNORMAL HIGH (ref 10–36)
CO2: 23 mmol/L (ref 18–29)
Chloride: 108 mmol/L (ref 97–108)
Creatinine, Ser: 1.13 mg/dL (ref 0.76–1.27)
GFR calc Af Amer: 66 mL/min/{1.73_m2} (ref >59)
Globulin, Total: 2.3 g/dL (ref 1.5–4.5)
Glucose: 109 mg/dL — ABNORMAL HIGH (ref 65–99)
Total Bilirubin: 0.8 mg/dL (ref 0.0–1.2)
Total Protein: 5.8 g/dL — ABNORMAL LOW (ref 6.0–8.5)

## 2013-01-13 LAB — HEMOGLOBIN A1C: Hgb A1c MFr Bld: 7.5 % — ABNORMAL HIGH (ref 4.8–5.6)

## 2013-01-13 LAB — SPECIMEN STATUS REPORT

## 2013-01-14 DIAGNOSIS — E119 Type 2 diabetes mellitus without complications: Secondary | ICD-10-CM | POA: Diagnosis not present

## 2013-01-14 DIAGNOSIS — IMO0001 Reserved for inherently not codable concepts without codable children: Secondary | ICD-10-CM | POA: Diagnosis not present

## 2013-01-14 DIAGNOSIS — R269 Unspecified abnormalities of gait and mobility: Secondary | ICD-10-CM | POA: Diagnosis not present

## 2013-01-14 DIAGNOSIS — M159 Polyosteoarthritis, unspecified: Secondary | ICD-10-CM | POA: Diagnosis not present

## 2013-01-14 DIAGNOSIS — I10 Essential (primary) hypertension: Secondary | ICD-10-CM | POA: Diagnosis not present

## 2013-01-15 ENCOUNTER — Encounter: Payer: Self-pay | Admitting: Internal Medicine

## 2013-01-15 DIAGNOSIS — R0600 Dyspnea, unspecified: Secondary | ICD-10-CM | POA: Insufficient documentation

## 2013-01-15 DIAGNOSIS — E876 Hypokalemia: Secondary | ICD-10-CM | POA: Insufficient documentation

## 2013-01-15 DIAGNOSIS — R21 Rash and other nonspecific skin eruption: Secondary | ICD-10-CM | POA: Insufficient documentation

## 2013-01-15 DIAGNOSIS — R609 Edema, unspecified: Secondary | ICD-10-CM | POA: Insufficient documentation

## 2013-01-17 DIAGNOSIS — I10 Essential (primary) hypertension: Secondary | ICD-10-CM | POA: Diagnosis not present

## 2013-01-17 DIAGNOSIS — R269 Unspecified abnormalities of gait and mobility: Secondary | ICD-10-CM | POA: Diagnosis not present

## 2013-01-17 DIAGNOSIS — IMO0001 Reserved for inherently not codable concepts without codable children: Secondary | ICD-10-CM | POA: Diagnosis not present

## 2013-01-17 DIAGNOSIS — E119 Type 2 diabetes mellitus without complications: Secondary | ICD-10-CM | POA: Diagnosis not present

## 2013-01-17 DIAGNOSIS — M159 Polyosteoarthritis, unspecified: Secondary | ICD-10-CM | POA: Diagnosis not present

## 2013-01-18 ENCOUNTER — Other Ambulatory Visit: Payer: Self-pay

## 2013-01-18 ENCOUNTER — Other Ambulatory Visit: Payer: Self-pay | Admitting: *Deleted

## 2013-01-18 ENCOUNTER — Ambulatory Visit (HOSPITAL_COMMUNITY): Payer: Medicare Other | Attending: Cardiology | Admitting: Radiology

## 2013-01-18 ENCOUNTER — Encounter: Payer: Self-pay | Admitting: *Deleted

## 2013-01-18 DIAGNOSIS — Z87891 Personal history of nicotine dependence: Secondary | ICD-10-CM | POA: Diagnosis not present

## 2013-01-18 DIAGNOSIS — R0600 Dyspnea, unspecified: Secondary | ICD-10-CM

## 2013-01-18 DIAGNOSIS — M159 Polyosteoarthritis, unspecified: Secondary | ICD-10-CM | POA: Diagnosis not present

## 2013-01-18 DIAGNOSIS — I079 Rheumatic tricuspid valve disease, unspecified: Secondary | ICD-10-CM | POA: Insufficient documentation

## 2013-01-18 DIAGNOSIS — E119 Type 2 diabetes mellitus without complications: Secondary | ICD-10-CM | POA: Insufficient documentation

## 2013-01-18 DIAGNOSIS — R0602 Shortness of breath: Secondary | ICD-10-CM

## 2013-01-18 DIAGNOSIS — I10 Essential (primary) hypertension: Secondary | ICD-10-CM | POA: Insufficient documentation

## 2013-01-18 DIAGNOSIS — R609 Edema, unspecified: Secondary | ICD-10-CM | POA: Diagnosis not present

## 2013-01-18 DIAGNOSIS — I059 Rheumatic mitral valve disease, unspecified: Secondary | ICD-10-CM | POA: Insufficient documentation

## 2013-01-18 DIAGNOSIS — R269 Unspecified abnormalities of gait and mobility: Secondary | ICD-10-CM | POA: Diagnosis not present

## 2013-01-18 DIAGNOSIS — IMO0001 Reserved for inherently not codable concepts without codable children: Secondary | ICD-10-CM | POA: Diagnosis not present

## 2013-01-18 NOTE — Progress Notes (Signed)
Echocardiogram performed.  

## 2013-01-18 NOTE — Progress Notes (Signed)
Patient aware.

## 2013-01-19 ENCOUNTER — Encounter: Payer: Self-pay | Admitting: Internal Medicine

## 2013-01-19 ENCOUNTER — Ambulatory Visit (INDEPENDENT_AMBULATORY_CARE_PROVIDER_SITE_OTHER): Payer: Medicare Other | Admitting: Internal Medicine

## 2013-01-19 VITALS — BP 124/68 | HR 95 | Temp 96.7°F | Resp 13 | Ht 65.0 in | Wt 151.2 lb

## 2013-01-19 DIAGNOSIS — E049 Nontoxic goiter, unspecified: Secondary | ICD-10-CM | POA: Diagnosis not present

## 2013-01-19 DIAGNOSIS — R609 Edema, unspecified: Secondary | ICD-10-CM

## 2013-01-19 DIAGNOSIS — I10 Essential (primary) hypertension: Secondary | ICD-10-CM | POA: Diagnosis not present

## 2013-01-19 DIAGNOSIS — J90 Pleural effusion, not elsewhere classified: Secondary | ICD-10-CM | POA: Insufficient documentation

## 2013-01-19 DIAGNOSIS — E042 Nontoxic multinodular goiter: Secondary | ICD-10-CM | POA: Insufficient documentation

## 2013-01-19 DIAGNOSIS — E119 Type 2 diabetes mellitus without complications: Secondary | ICD-10-CM | POA: Diagnosis not present

## 2013-01-19 DIAGNOSIS — R0600 Dyspnea, unspecified: Secondary | ICD-10-CM

## 2013-01-19 DIAGNOSIS — E785 Hyperlipidemia, unspecified: Secondary | ICD-10-CM | POA: Diagnosis not present

## 2013-01-19 DIAGNOSIS — I5031 Acute diastolic (congestive) heart failure: Secondary | ICD-10-CM

## 2013-01-19 DIAGNOSIS — K219 Gastro-esophageal reflux disease without esophagitis: Secondary | ICD-10-CM

## 2013-01-19 DIAGNOSIS — R0989 Other specified symptoms and signs involving the circulatory and respiratory systems: Secondary | ICD-10-CM

## 2013-01-19 DIAGNOSIS — E876 Hypokalemia: Secondary | ICD-10-CM

## 2013-01-19 DIAGNOSIS — IMO0001 Reserved for inherently not codable concepts without codable children: Secondary | ICD-10-CM | POA: Diagnosis not present

## 2013-01-19 DIAGNOSIS — R269 Unspecified abnormalities of gait and mobility: Secondary | ICD-10-CM | POA: Diagnosis not present

## 2013-01-19 DIAGNOSIS — I509 Heart failure, unspecified: Secondary | ICD-10-CM

## 2013-01-19 DIAGNOSIS — M159 Polyosteoarthritis, unspecified: Secondary | ICD-10-CM | POA: Diagnosis not present

## 2013-01-19 DIAGNOSIS — R21 Rash and other nonspecific skin eruption: Secondary | ICD-10-CM

## 2013-01-19 DIAGNOSIS — R61 Generalized hyperhidrosis: Secondary | ICD-10-CM | POA: Insufficient documentation

## 2013-01-19 MED ORDER — SIMVASTATIN 10 MG PO TABS: 10.0000 mg | ORAL_TABLET | Freq: Every day | ORAL | Status: DC

## 2013-01-19 MED ORDER — METOPROLOL TARTRATE 12.5 MG HALF TABLET: 12.5000 mg | ORAL_TABLET | Freq: Two times a day (BID) | ORAL | Status: DC

## 2013-01-19 MED ORDER — POTASSIUM CHLORIDE CRYS ER 20 MEQ PO TBCR: 20.0000 meq | EXTENDED_RELEASE_TABLET | Freq: Every day | ORAL | Status: DC

## 2013-01-19 MED ORDER — METFORMIN HCL 500 MG PO TABS: 500.0000 mg | ORAL_TABLET | Freq: Two times a day (BID) | ORAL | Status: DC

## 2013-01-19 MED ORDER — FREESTYLE SYSTEM KIT: 1.0000 | PACK | Status: DC | PRN

## 2013-01-19 MED ORDER — GLUCOSE BLOOD VI STRP: ORAL_STRIP | Status: DC

## 2013-01-19 NOTE — Patient Instructions (Addendum)
Start taking your metformin once a day  Start taking potassium supplement and continue your lasix as present  Start taking your cholesterol medication and metoprolol as prescribed  I will see you in office in 6 weeks  Monitor your weight on daily basis and if you notice weight gain of more tahn 3 lbs in 2 days, please notify the office  Please don not stop taking the prescribed medications by yourself. Notify office if you notice any side effects to any of the medication  Bring log of your sugar reading/ glucometer for review next office visit

## 2013-01-19 NOTE — Progress Notes (Signed)
Patient ID: Logan Bean, male   DOB: Jun 28, 1921, 79 y.o.   MRN: [id number]  Chief Complaint  Patient presents with  . Edema  . sweaty palms   Allergies  Allergen Reactions  . Morphine And Related Nausea And Vomiting  . Oxycodone Nausea And Vomiting   HPI 79 y/o male patient is here with his POA for follow up from last visit. In brief, he was in rehab post ORIF for closed bimalleolar fracture of right ankle and c.diff colits. He has completed his c.diff antibiotic course and currently has regular bowel movement. His lasix dose was increased to 40 mg bid last visit due to increased edema and has swelling has subsided. His dyspnea has also improved. Reviewed his echocardiogram. He has upcoming cardiology appointment in august 2014 Rash has resolved completely  He has noticed sweating in his palms and feels it to be oily. He also feels his hair and nails stopped growing for 3 months and now they are growing again. His POA wants to know why this happened  He has been getting PT/OT and RN services and working well  Review of Systems  Constitutional: Negative for malaise/fatigue.  Respiratory: Negative for cough and shortness of breath.   Cardiovascular: Negative for chest pain and orthopnea and PND Gastrointestinal: Negative for heartburn, abdominal pain, diarrhea and constipation.  Musculoskeletal: Negative for myalgias and joint pain.  Skin: Negative.   Neurological: Negative for headaches.  Psychiatric/Behavioral: Negative for depression. The patient does not have insomnia.  BP 124/68  Pulse 95  Temp(Src) 96.7 F (35.9 C) (Oral)  Resp 13  Ht 5\' 5"  (1.651 m)  Wt 151 lb 3.2 oz (68.584 kg)  BMI 25.16 kg/m2  Constitutional: He is oriented to person, place, and time.  thin and frail Neck: Neck supple. No JVD present. No thyromegaly present.  Cardiovascular: Normal rate, regular rhythm and intact distal pulses.   Respiratory: Effort normal and breath sounds normal. No respiratory  distress. No crackles, rhonchi or wheeze GI: Soft. Bowel sounds are normal. He exhibits no distension. There is no tenderness.  Musculoskeletal: Normal range of motion. He exhibits edema 1+ in his legs upto below the knee. The edema in torso and arms have subsided. Slight erythema in both the legs, skin intact Neurological: He is alert and oriented to person, place, and time.  Skin: Skin is warm and dry. No rash Psychiatric: He has a normal mood and affect. His behavior is normal.   LABS-   Procedures-  ECHOCARDIOGRAM 01/18/13 Study Conclusions  - Left ventricle: The cavity size was normal. Wall thickness   was normal. Systolic function was normal. The estimated   ejection fraction was in the range of 50% to 55%. Wall   motion was normal; there were no regional wall motion   abnormalities. - Mitral valve: Mildly to moderately calcified annulus.   Moderate regurgitation. - Left atrium: The atrium was mildly dilated. - Right ventricle: The cavity size was mildly dilated. - Right atrium: The atrium was mildly dilated. - Pulmonary arteries: PA peak pressure: 33mm Hg (S). - Pericardium, extracardiac: A trivial pericardial effusion   was identified.  CT CHEST WITH CONTRAST 12/31/12 Bilateral pleural effusions and basilar atelectasis.  On the right, loculated fluid is noted within the fissures.  These findings are new when compared to the prior chest x-ray.  This may reflect result of congestive heart failure or infection.  Recommend follow- up until clearance.   Right upper lobe 6 mm opacity may represent atelectasis.  No well- defined worrisome pulmonary mass separate from atelectatic changes/pleural fluid.   Preaortic slightly enlarged lymph nodes with short axis dimension of 1 cm may be reactive in origin or related to congestive heart failure.   Atherosclerotic type changes of the thoracic aorta with mild ectasia.  No dissection.   Enlarged thyroid gland greater on the right  with substernal extension.  Thyroid ultrasound recommended for further delineation.   Prior reversed shoulder arthroplasty.  Acetabular screw partially traverses medial cortex.  CXR 01/13/13 No change in loculated fluid within the right minor fissure and laterally in the right lower hemithorax.  Stable mild cardiomegaly   ASSESSMENT/PLAN  Dyspnea Improved. Reviewed prior cxr s/o loculated effusion and a repeat one showing the same. Echocardiogram showed decreased EF, elevated pro-bnp and then with his edema and JVD +, likely from chf, has diuresed well at present and symptoms improved  Edema Improved. Continue lasix 40 mg bid for now. Check bmp for renal function. Monitor daily weight. Will add kcl 20 meq daily for now. His weight is down from 162 lbs to 151 lbs, close to his dry weight.   CHF New finding. Reviewed echocardiogram showing EF 50-55% and normal systolic function. Will continue his lisinopril. Will add metoprolol 12.5 mg bid for now. Start statin, continue asa and will continue 40 mg bid of lasix for now. Check bmp. Check daily weight for now. Has upcoming cardiology appointment.continue kcl supplement  Sweaty palms- with enlarged thyroid noted in ct chest and his current symptms, will check tsh level today  Enlarged thyroid- incidental finding on ct chest. Will get thyroid usg for further evaluation  Rash- resolved  htn- bp well controlled. Continue current regimen of lisinopril and amlodipine  gerd- continue prilosec and monitor clinically  DM- elevated a1c. Will start him on metformin 500 mg po daily for now and restart simvastatin 10 mg daily. Continue asa and acei. Glucometer and diabetic supplies script provided, pt to bring his glucose log next visit  Labs- bmp  Spent 45 mintues in total discussing current medical issues, medications, answering questions from patient and his POA. Explained that stopping of nails from growing and hair not growing for 3 months  could be due to some nutritional deficit - mainly keratin and could be from stress with his hospital admission and then being in rehab. His PT/OT will be continued for now

## 2013-01-20 ENCOUNTER — Other Ambulatory Visit: Payer: Self-pay | Admitting: Internal Medicine

## 2013-01-20 DIAGNOSIS — E049 Nontoxic goiter, unspecified: Secondary | ICD-10-CM

## 2013-01-20 LAB — BASIC METABOLIC PANEL
BUN: 39 mg/dL — ABNORMAL HIGH (ref 10–36)
Calcium: 9.4 mg/dL (ref 8.6–10.2)
Creatinine, Ser: 1.28 mg/dL — ABNORMAL HIGH (ref 0.76–1.27)
GFR calc non Af Amer: 49 mL/min/{1.73_m2} — ABNORMAL LOW (ref >59)
Sodium: 141 mmol/L (ref 134–144)

## 2013-01-21 ENCOUNTER — Encounter: Payer: Self-pay | Admitting: Cardiology

## 2013-01-21 ENCOUNTER — Other Ambulatory Visit: Payer: Self-pay | Admitting: Internal Medicine

## 2013-01-21 ENCOUNTER — Ambulatory Visit (INDEPENDENT_AMBULATORY_CARE_PROVIDER_SITE_OTHER): Payer: Medicare Other | Admitting: Cardiology

## 2013-01-21 VITALS — BP 92/60 | HR 104 | Ht 67.0 in | Wt 149.1 lb

## 2013-01-21 DIAGNOSIS — R609 Edema, unspecified: Secondary | ICD-10-CM

## 2013-01-21 DIAGNOSIS — I482 Chronic atrial fibrillation, unspecified: Secondary | ICD-10-CM | POA: Insufficient documentation

## 2013-01-21 DIAGNOSIS — I4891 Unspecified atrial fibrillation: Secondary | ICD-10-CM

## 2013-01-21 LAB — TSH: TSH: 0.24 u[IU]/mL — ABNORMAL LOW (ref 0.35–5.50)

## 2013-01-21 LAB — T4, FREE: Free T4: 1.32 ng/dL (ref 0.60–1.60)

## 2013-01-21 MED ORDER — APIXABAN 5 MG PO TABS: 5.0000 mg | ORAL_TABLET | Freq: Two times a day (BID) | ORAL | Status: DC

## 2013-01-21 NOTE — Patient Instructions (Addendum)
Your physician recommends that you schedule a follow-up appointment in:   3 WEEKS WITH DR  [facility] OR [place] [place]  A  DAY DR  [facility] HERE Your physician has recommended you make the following change in your medication: START ELIQUIS 5 MG 1 TAB TWICE DAILY Your physician recommends that you return for lab work in: TODAY   TSH T3 FREE T4 DX 427.31 Your physician has recommended that you wear a holter monitor. Holter monitors are medical devices that record the heart's electrical activity. Doctors most often use these monitors to diagnose arrhythmias. Arrhythmias are problems with the speed or rhythm of the heartbeat. The monitor is a small, portable device. You can wear one while you do your normal daily activities. This is usually used to diagnose what is causing palpitations/syncope (passing out).   MAY NEED CARDIOVERSION

## 2013-01-21 NOTE — Progress Notes (Signed)
Patient aware.

## 2013-01-21 NOTE — Progress Notes (Signed)
HPI The patient presents for evaluation of lower extremity edema. He has had about 24 pound weight gain that started after he had ankle surgery for fracture which was preceded by a left shoulder surgery. From then on he had progressive lower strandy swelling. He had dyspnea though he was very limited because of the rehabilitation he was going through. He wasn't really describing PND or orthopnea. He's never been told he had rapid heart rate. He's not had any palpitations, presyncope or syncope. He's never had any chest pressure, neck or arm discomfort. His weight now is back down because he has been on diuretics. He was also started on beta blocker recently. Today he is noted to be in atrial fibrillation which is a new rhythm. He's never had any prior cardiac history. He denies any prior cardiac workup. I did review an echocardiogram which demonstrates a mildly reduced left ventricular systolic function with mildly elevated pulmonary pressures but no significant structural abnormalities for his age.  Allergies  Allergen Reactions  . Morphine And Related Nausea And Vomiting  . Oxycodone Nausea And Vomiting    Current Outpatient Prescriptions  Medication Sig Dispense Refill  . acetaminophen (TYLENOL) 650 MG CR tablet Take 650 mg by mouth daily as needed (joint pain).      Marland Kitchen amLODipine (NORVASC) 5 MG tablet Take one tablet once a day for blood pressure  30 tablet  5  . aspirin 81 MG tablet Take 1 tablet (81 mg total) by mouth at bedtime.  30 tablet  0  . Calcium Carbonate-Vitamin D 600-200 MG-UNIT TABS Take by mouth 2 (two) times daily. Take 1 tablet daily for vitamin supplement.      . furosemide (LASIX) 40 MG tablet Take 1 tablet (40 mg total) by mouth 2 (two) times daily.  60 tablet  5  . Glucosamine-Chondroit-Vit C-Mn (GLUCOSAMINE 1500 COMPLEX) CAPS Take one tablet once a day  30 each  0  . glucose blood test strip Use as instructed  100 each  12  . glucose monitoring kit (FREESTYLE)  monitoring kit 1 each by Does not apply route as needed for other. Take blood sugar once daily.  1 each  0  . lisinopril (PRINIVIL,ZESTRIL) 20 MG tablet Take 1 tablet (20 mg total) by mouth daily. Take 1 tablet by mouth daily for blood pressure.  30 tablet  5  . metFORMIN (GLUCOPHAGE) 500 MG tablet Take 500 mg by mouth daily with breakfast.      . metoprolol tartrate (LOPRESSOR) 12.5 mg TABS Take 0.5 tablets (12.5 mg total) by mouth 2 (two) times daily.  60 tablet  3  . Multiple Vitamin (MULITIVITAMIN WITH MINERALS) TABS Take 1 tablet by mouth daily. Take 1 tablet daily for vitamin supplement.      Marland Kitchen omeprazole (PRILOSEC) 20 MG capsule Take 1 capsule (20 mg total) by mouth daily before breakfast. Take 1 tablet daily for reflux.  30 capsule  5  . potassium chloride SA (K-DUR,KLOR-CON) 20 MEQ tablet Take 1 tablet (20 mEq total) by mouth daily.  30 tablet  3  . simvastatin (ZOCOR) 10 MG tablet Take 1 tablet (10 mg total) by mouth at bedtime.  90 tablet  3   No current facility-administered medications for this visit.    Past Medical History  Diagnosis Date  . Hypertension   . Hypercholesteremia   . Stomach ulcer     years  . H/O hiatal hernia   . Varicose veins     both legs  .  Arthritis     left knee  . Diabetes mellitus     diet controlled, pt does not check cbg  . Left rotator cuff tear Jul 12, 2012  . Edema   . Unspecified hereditary and idiopathic peripheral neuropathy   . Dizziness and giddiness   . Unspecified vitamin D deficiency   . Diaphragmatic hernia without mention of obstruction or gangrene     Past Surgical History  Procedure Laterality Date  . Esopagus strectching  2003 and 3 yrs ago  . Tonsillectomy  age 79  . Shoulder open rotator cuff repair  11/05/2011    Procedure: ROTATOR CUFF REPAIR SHOULDER OPEN;  Surgeon: Jacki Cones, MD;  Location: [facility] ORS;  Service: Orthopedics;  Laterality: Left;  Left Shoulder Rotator Cuff Repair Open with Graft and Anchors  .  Reverse shoulder arthroplasty Right 09/17/2012    Procedure: REVERSE SHOULDER ARTHROPLASTY;  Surgeon: Verlee Rossetti, MD;  Location: [facility] OR;  Service: Orthopedics;  Laterality: Right;  . Orif ankle fracture Right 09/20/2012    Procedure: OPEN REDUCTION INTERNAL FIXATION (ORIF) ANKLE FRACTURE;  Surgeon: Javier Docker, MD;  Location: [facility] OR[facility];  Service: Orthopedics;  Laterality: Right;    Family History  Problem Relation Age of Onset  . Parkinson's disease Mother   . Hip fracture Mother   . Heart disease Mother   . Alzheimer's disease Mother   . Heart disease Father   . Parkinson's disease Sister   . Heart disease Brother     History   Social History  . Marital Status: Widowed    Spouse Name: N/A    Number of Children: 0  . Years of Education: N/A   Occupational History  . Not on file.   Social History Main Topics  . Smoking status: Former Smoker -- 50 years    Types: Pipe, Cigarettes    Quit date: 07/14/1996  . Smokeless tobacco: Never Used  . Alcohol Use: Yes     Comment: occasional  . Drug Use: No  . Sexually Active: Not on file   Other Topics Concern  . Not on file   Social History Narrative   Lives alone.   ROS:  Occasional lightheadedness, cough, recent C. Difficile, urinary frequency secondary to Lasix. Otherwise as stated in the HPI and negative for all other systems.  PHYSICAL EXAM BP 92/60  Pulse 104  Ht 5\' 7"  (1.702 m)  Wt 149 lb 1.9 oz (67.64 kg)  BMI 23.35 kg/m2 GENERAL:  Well appearing, looks a Nexium and then stated age HEENT:  Pupils equal round and reactive, fundi not visualized, oral mucosa unremarkable NECK:  mildjugular venous distention at 90, waveform within normal limits, carotid upstroke brisk and symmetric, no bruits, no thyromegaly LYMPHATICS:  No cervical, inguinal adenopathy LUNGS:  Clear to auscultation bilaterally BACK:  No CVA tenderness CHEST:  Bilateral mastectomy scars HEART:  PMI not displaced or sustained,S1 and S2 within  normal limits, no S3,  no clicks, no rubs, no murmurs, irregular ABD:  Flat, positive bowel sounds normal in frequency in pitch, no bruits, no rebound, no guarding, no midline pulsatile mass, no hepatomegaly, no splenomegaly EXT:  2 plus pulses throughout, moderate edema, no cyanosis no clubbing SKIN:  No rashes no nodules NEURO:  Cranial nerves II through XII grossly intact, motor grossly intact throughout PSYCH:  Cognitively intact, oriented to person place and time   EKG:  Atrial fibrillation, l intervals within normal limits, baseline artifact, no acute ST-T wave changes. 01/21/2013  ASSESSMENT AND PLAN   CHF:  The patient seems to have right greater than left heart failure with lower extremity edema. We discussed at length compression stockings and other conservative measures. He allergy this is not entirely clear although atrial fibrillation could contribute. For now he will continue the diuretics as listed.  ATRIAL FIBRILLATION:  This is a new diagnosis for the patient. We discussed this at length.  Mr. Logan Bean has a CHA2DS2 - VASc score of 4 with a risk of stroke of 4%  and a HAS - BLED score of 2 with a low risk of bleeding.  He therefore has a clear indication for anticoagulation. We discussed this at length. I'm going to check a 24-hour Holter. I would like to check a T3-T4 given his depressed TSH.  I will start him on Eliquis.  After he has been on this for 3-4 weeks I will consider cardioversion.  I told her to stop her ASA.

## 2013-01-22 LAB — T3: T3, Total: 124.2 ng/dL (ref 80.0–204.0)

## 2013-01-24 ENCOUNTER — Telehealth: Payer: Self-pay | Admitting: *Deleted

## 2013-01-24 ENCOUNTER — Ambulatory Visit
Admission: RE | Admit: 2013-01-24 | Discharge: 2013-01-24 | Disposition: A | Discharge status: Home / Self Care | Payer: Medicare Other | Source: Ambulatory Visit | Attending: Internal Medicine | Admitting: Internal Medicine

## 2013-01-24 ENCOUNTER — Encounter (INDEPENDENT_AMBULATORY_CARE_PROVIDER_SITE_OTHER): Payer: Medicare Other

## 2013-01-24 DIAGNOSIS — E119 Type 2 diabetes mellitus without complications: Secondary | ICD-10-CM | POA: Diagnosis not present

## 2013-01-24 DIAGNOSIS — M159 Polyosteoarthritis, unspecified: Secondary | ICD-10-CM | POA: Diagnosis not present

## 2013-01-24 DIAGNOSIS — I10 Essential (primary) hypertension: Secondary | ICD-10-CM | POA: Diagnosis not present

## 2013-01-24 DIAGNOSIS — E0789 Other specified disorders of thyroid: Secondary | ICD-10-CM | POA: Diagnosis not present

## 2013-01-24 DIAGNOSIS — E049 Nontoxic goiter, unspecified: Secondary | ICD-10-CM

## 2013-01-24 DIAGNOSIS — I4891 Unspecified atrial fibrillation: Secondary | ICD-10-CM

## 2013-01-24 DIAGNOSIS — R269 Unspecified abnormalities of gait and mobility: Secondary | ICD-10-CM | POA: Diagnosis not present

## 2013-01-24 DIAGNOSIS — IMO0001 Reserved for inherently not codable concepts without codable children: Secondary | ICD-10-CM | POA: Diagnosis not present

## 2013-01-24 NOTE — Telephone Encounter (Signed)
24 Hr holter monitor placed on Pt 01/24/13 TK

## 2013-01-25 DIAGNOSIS — R269 Unspecified abnormalities of gait and mobility: Secondary | ICD-10-CM | POA: Diagnosis not present

## 2013-01-25 DIAGNOSIS — I10 Essential (primary) hypertension: Secondary | ICD-10-CM | POA: Diagnosis not present

## 2013-01-25 DIAGNOSIS — E119 Type 2 diabetes mellitus without complications: Secondary | ICD-10-CM | POA: Diagnosis not present

## 2013-01-25 DIAGNOSIS — M159 Polyosteoarthritis, unspecified: Secondary | ICD-10-CM | POA: Diagnosis not present

## 2013-01-25 DIAGNOSIS — IMO0001 Reserved for inherently not codable concepts without codable children: Secondary | ICD-10-CM | POA: Diagnosis not present

## 2013-01-26 ENCOUNTER — Other Ambulatory Visit: Payer: Self-pay | Admitting: Internal Medicine

## 2013-01-26 DIAGNOSIS — E079 Disorder of thyroid, unspecified: Secondary | ICD-10-CM

## 2013-01-28 DIAGNOSIS — M159 Polyosteoarthritis, unspecified: Secondary | ICD-10-CM | POA: Diagnosis not present

## 2013-01-28 DIAGNOSIS — R269 Unspecified abnormalities of gait and mobility: Secondary | ICD-10-CM | POA: Diagnosis not present

## 2013-01-28 DIAGNOSIS — IMO0001 Reserved for inherently not codable concepts without codable children: Secondary | ICD-10-CM | POA: Diagnosis not present

## 2013-01-28 DIAGNOSIS — I10 Essential (primary) hypertension: Secondary | ICD-10-CM | POA: Diagnosis not present

## 2013-01-28 DIAGNOSIS — E119 Type 2 diabetes mellitus without complications: Secondary | ICD-10-CM | POA: Diagnosis not present

## 2013-01-31 DIAGNOSIS — J810 Acute pulmonary edema: Secondary | ICD-10-CM | POA: Insufficient documentation

## 2013-01-31 DIAGNOSIS — IMO0001 Reserved for inherently not codable concepts without codable children: Secondary | ICD-10-CM | POA: Diagnosis not present

## 2013-01-31 DIAGNOSIS — Z7901 Long term (current) use of anticoagulants: Secondary | ICD-10-CM | POA: Insufficient documentation

## 2013-01-31 DIAGNOSIS — M159 Polyosteoarthritis, unspecified: Secondary | ICD-10-CM | POA: Diagnosis not present

## 2013-01-31 DIAGNOSIS — R269 Unspecified abnormalities of gait and mobility: Secondary | ICD-10-CM | POA: Diagnosis not present

## 2013-01-31 DIAGNOSIS — E119 Type 2 diabetes mellitus without complications: Secondary | ICD-10-CM | POA: Diagnosis not present

## 2013-01-31 DIAGNOSIS — I10 Essential (primary) hypertension: Secondary | ICD-10-CM | POA: Diagnosis not present

## 2013-01-31 NOTE — Progress Notes (Signed)
Patient ID: Logan Bean, male   DOB: 11-Aug-1920, 79 y.o.   MRN: [id number]        PROGRESS NOTE  DATE: 01/04/2013  FACILITY:  [facility]  LEVEL OF CARE: SNF (31)  Acute Visit  CHIEF COMPLAINT:  Manage edema.    HISTORY OF PRESENT ILLNESS: I was requested by the staff to assess the patient regarding above problem(s):  EDEMA: The patient had a chest CT done on 12/31/2012 which showed bilateral pleural effusions.  On the right side, there is loculated fluid within the fissures which may reflect CHF or infection.  Patient is currently on Lasix.  He is admitting to intermittent shortness of breath and increased lower extremity swelling.  He denies cough, fever, chills or night sweats.  He denies suprapubic pain.  No complications reported from the medications currently being used.   PAST MEDICAL HISTORY : Reviewed.  No changes.  CURRENT MEDICATIONS: Reviewed per [facility]  REVIEW OF SYSTEMS:  GENERAL: no change in appetite, no fatigue, no weight changes, no fever, chills or weakness RESPIRATORY: no cough, SOB, DOE,, wheezing, hemoptysis CARDIAC: no chest pain or palpitations; increasing lower extremity swelling GI: no abdominal pain, diarrhea, constipation, heart burn, nausea or vomiting  PHYSICAL EXAMINATION  GENERAL: no acute distress, normal body habitus EYES: conjunctivae normal, sclerae normal, normal eye lids NECK: supple, trachea midline, no neck masses, no thyroid tenderness, no thyromegaly LYMPHATICS: no LAN in the neck, no supraclavicular LAN RESPIRATORY: breathing is even & unlabored, BS CTAB CARDIAC: RRR, no murmur,no extra heart sounds EDEMA/VARICOSITIES:  +2 bilateral lower extremity edema  ARTERIAL:  pedal pulses nonpalpable   GI: abdomen soft, normal BS, no masses, no tenderness, no hepatomegaly, no splenomegaly PSYCHIATRIC: the patient is alert & oriented to person, affect & behavior appropriate  LABS/RADIOLOGY: 11/2012:  BUN 30, creatinine  0.96.    ASSESSMENT/PLAN:  Bilateral pleural effusions.    Pulmonary edema.    Lower extremity edema.    Hypokalemia-start KCL 20mQ qd.  Patient is symptomatic.  Increase Lasix to 40 mg q.d.     V58.69.  Check BMP in three days.    CPT CODE: 11914

## 2013-02-01 ENCOUNTER — Ambulatory Visit
Admission: RE | Admit: 2013-02-01 | Discharge: 2013-02-01 | Disposition: A | Discharge status: Home / Self Care | Payer: Medicare Other | Source: Ambulatory Visit | Attending: Internal Medicine | Admitting: Internal Medicine

## 2013-02-01 ENCOUNTER — Telehealth: Payer: Self-pay | Admitting: *Deleted

## 2013-02-01 ENCOUNTER — Other Ambulatory Visit (HOSPITAL_COMMUNITY)
Admission: RE | Admit: 2013-02-01 | Discharge: 2013-02-01 | Disposition: A | Discharge status: Home / Self Care | Payer: Medicare Other | Source: Ambulatory Visit | Attending: Interventional Radiology | Admitting: Interventional Radiology

## 2013-02-01 DIAGNOSIS — E049 Nontoxic goiter, unspecified: Secondary | ICD-10-CM | POA: Diagnosis not present

## 2013-02-01 DIAGNOSIS — E0789 Other specified disorders of thyroid: Secondary | ICD-10-CM | POA: Diagnosis not present

## 2013-02-01 DIAGNOSIS — E079 Disorder of thyroid, unspecified: Secondary | ICD-10-CM

## 2013-02-01 NOTE — Telephone Encounter (Signed)
Prior [place] # [id number] obtained  Pharmacy aware.

## 2013-02-02 DIAGNOSIS — E119 Type 2 diabetes mellitus without complications: Secondary | ICD-10-CM | POA: Diagnosis not present

## 2013-02-02 DIAGNOSIS — IMO0001 Reserved for inherently not codable concepts without codable children: Secondary | ICD-10-CM | POA: Diagnosis not present

## 2013-02-02 DIAGNOSIS — M159 Polyosteoarthritis, unspecified: Secondary | ICD-10-CM | POA: Diagnosis not present

## 2013-02-02 DIAGNOSIS — R269 Unspecified abnormalities of gait and mobility: Secondary | ICD-10-CM | POA: Diagnosis not present

## 2013-02-02 DIAGNOSIS — I10 Essential (primary) hypertension: Secondary | ICD-10-CM | POA: Diagnosis not present

## 2013-02-03 DIAGNOSIS — I10 Essential (primary) hypertension: Secondary | ICD-10-CM | POA: Diagnosis not present

## 2013-02-03 DIAGNOSIS — R269 Unspecified abnormalities of gait and mobility: Secondary | ICD-10-CM | POA: Diagnosis not present

## 2013-02-03 DIAGNOSIS — M159 Polyosteoarthritis, unspecified: Secondary | ICD-10-CM | POA: Diagnosis not present

## 2013-02-03 DIAGNOSIS — E119 Type 2 diabetes mellitus without complications: Secondary | ICD-10-CM | POA: Diagnosis not present

## 2013-02-03 DIAGNOSIS — IMO0001 Reserved for inherently not codable concepts without codable children: Secondary | ICD-10-CM | POA: Diagnosis not present

## 2013-02-08 DIAGNOSIS — M159 Polyosteoarthritis, unspecified: Secondary | ICD-10-CM | POA: Diagnosis not present

## 2013-02-08 DIAGNOSIS — E119 Type 2 diabetes mellitus without complications: Secondary | ICD-10-CM | POA: Diagnosis not present

## 2013-02-08 DIAGNOSIS — R269 Unspecified abnormalities of gait and mobility: Secondary | ICD-10-CM | POA: Diagnosis not present

## 2013-02-08 DIAGNOSIS — I10 Essential (primary) hypertension: Secondary | ICD-10-CM | POA: Diagnosis not present

## 2013-02-08 DIAGNOSIS — IMO0001 Reserved for inherently not codable concepts without codable children: Secondary | ICD-10-CM | POA: Diagnosis not present

## 2013-02-09 ENCOUNTER — Ambulatory Visit (INDEPENDENT_AMBULATORY_CARE_PROVIDER_SITE_OTHER): Payer: Medicare Other | Admitting: Physician Assistant

## 2013-02-09 ENCOUNTER — Encounter: Payer: Self-pay | Admitting: Physician Assistant

## 2013-02-09 VITALS — BP 117/63 | HR 83 | Ht 67.0 in | Wt 150.0 lb

## 2013-02-09 DIAGNOSIS — I10 Essential (primary) hypertension: Secondary | ICD-10-CM

## 2013-02-09 DIAGNOSIS — I4891 Unspecified atrial fibrillation: Secondary | ICD-10-CM | POA: Diagnosis not present

## 2013-02-09 DIAGNOSIS — E0590 Thyrotoxicosis, unspecified without thyrotoxic crisis or storm: Secondary | ICD-10-CM

## 2013-02-09 DIAGNOSIS — I5033 Acute on chronic diastolic (congestive) heart failure: Secondary | ICD-10-CM | POA: Diagnosis not present

## 2013-02-09 MED ORDER — FUROSEMIDE 40 MG PO TABS: 60.0000 mg | ORAL_TABLET | Freq: Every day | ORAL | Status: DC

## 2013-02-09 NOTE — Progress Notes (Addendum)
[address] [place], [place]  [address] Phone: [phone] Fax:  [phone]  Date:  02/09/2013   ID:  Logan Anzel, Bean 11-03-1920, MRN [id number]  PCP:  Oneal Grout, MD  Cardiologist:  Dr. Rollene Rotunda     History of Present Illness: Logan Bean is a 79 y.o. male who returns for follow up,  He has a hx of HTN, HL, DM2. He recently developed LE edema/volume overload after a prolonged hospitalization in 09/2012 which required rehab after discharge.  He had shoulder surgery, then fx his ankle.  He underwent ORIF.  Hospital stay was c/b C. Diff colitis.  He recently established with Dr. Rollene Rotunda and was noted to be in atrial fibrillation. He has been treated with diuretics. Echocardiogram 01/2013: EF 50-55%, normal wall motion, MAC, moderate MR, mild BAE, mild RVE, PASP 33, trivial effusion. Patient was placed on Eliquis for anticoagulation (CHADS2-VASc=5).  Labs indicated subclinical hyperthyroidism. Thyroid nodule has been biopsied and this was negative for malignancy. His PCP is following.  Patient is here with his POA.  They have several questions about AFib, CHF, hyperthyroidism.  I tried to answer them all and spent 20 minutes counseling the patient and his POA today.  He continues to note DOE.  He walks short distances with a walker.  He is probably NYHA Class IIb-III.  He denies orthopnea, PND.  His LE edema is unchanged.  He does note improvement after his legs are elevated, but they are worse at the end of the day.  He does have a Rx for compression hose.  He does not have these yet.  No CP or syncope.    Labs (7/14):   K 4.6, creatinine 1.13=> 1.28, ALT 31, Hgb 11.2, TSH 0.24, free T4 1.32, free T3 124.2  Wt Readings from Last 3 Encounters:  02/09/13 150 lb (68.04 kg)  01/21/13 149 lb 1.9 oz (67.64 kg)  01/19/13 151 lb 3.2 oz (68.584 kg)     Past Medical History  Diagnosis Date  . Hypertension   . Hypercholesteremia   . Stomach ulcer    years  . H/O hiatal hernia   . Varicose veins     both legs  . Arthritis     left knee  . Diabetes mellitus     diet controlled, pt does not check cbg  . Left rotator cuff tear Jul 12, 2012  . Edema   . Unspecified hereditary and idiopathic peripheral neuropathy   . Dizziness and giddiness   . Unspecified vitamin D deficiency   . Diaphragmatic hernia without mention of obstruction or gangrene     Current Outpatient Prescriptions  Medication Sig Dispense Refill  . acetaminophen (TYLENOL) 650 MG CR tablet Take 650 mg by mouth daily as needed (joint pain).      Marland Kitchen amLODipine (NORVASC) 5 MG tablet Take one tablet once a day for blood pressure  30 tablet  5  . apixaban (ELIQUIS) 5 MG TABS tablet Take 1 tablet (5 mg total) by mouth 2 (two) times daily.  60 tablet  6  . Calcium Carbonate-Vitamin D 600-200 MG-UNIT TABS Take by mouth 2 (two) times daily. Take 1 tablet daily for vitamin supplement.      . furosemide (LASIX) 40 MG tablet Take 1 tablet (40 mg total) by mouth 2 (two) times daily.  60 tablet  5  . Glucosamine-Chondroit-Vit C-Mn (GLUCOSAMINE 1500 COMPLEX) CAPS Take one tablet once a day  30 each  0  .  glucose blood test strip Use as instructed  100 each  12  . glucose monitoring kit (FREESTYLE) monitoring kit 1 each by Does not apply route as needed for other. Take blood sugar once daily.  1 each  0  . lisinopril (PRINIVIL,ZESTRIL) 20 MG tablet Take 1 tablet (20 mg total) by mouth daily. Take 1 tablet by mouth daily for blood pressure.  30 tablet  5  . metFORMIN (GLUCOPHAGE) 500 MG tablet Take 500 mg by mouth daily with breakfast.      . metoprolol tartrate (LOPRESSOR) 12.5 mg TABS Take 0.5 tablets (12.5 mg total) by mouth 2 (two) times daily.  60 tablet  3  . Multiple Vitamin (MULITIVITAMIN WITH MINERALS) TABS Take 1 tablet by mouth daily. Take 1 tablet daily for vitamin supplement.      Marland Kitchen omeprazole (PRILOSEC) 20 MG capsule Take 1 capsule (20 mg total) by mouth daily before  breakfast. Take 1 tablet daily for reflux.  30 capsule  5  . potassium chloride SA (K-DUR,KLOR-CON) 20 MEQ tablet Take 1 tablet (20 mEq total) by mouth daily.  30 tablet  3  . simvastatin (ZOCOR) 10 MG tablet Take 1 tablet (10 mg total) by mouth at bedtime.  90 tablet  3   No current facility-administered medications for this visit.    Allergies:    Allergies  Allergen Reactions  . Morphine And Related Nausea And Vomiting  . Oxycodone Nausea And Vomiting    Social History:  The patient  reports that he quit smoking about 16 years ago. His smoking use included Pipe and Cigarettes. He smoked 0.00 packs per day for 50 years. He has never used smokeless tobacco. He reports that  drinks alcohol. He reports that he does not use illicit drugs.   ROS:  Please see the history of present illness.   No melena, hematochezia, hematuria.  He has a mild NP cough.   All other systems reviewed and negative.   PHYSICAL EXAM: VS:  BP 117/63  Pulse 83  Ht 5\' 7"  (1.702 m)  Wt 150 lb (68.04 kg)  BMI 23.49 kg/m2 Well nourished, well developed, in no acute distress HEENT: normal Neck: + JVD at 90 degrees Cardiac:  normal S1, S2; irreg irreg rhythm; no murmur Lungs:  clear to auscultation bilaterally, no wheezing, rhonchi or rales Abd: soft, nontender, no hepatomegaly Ext: 1-2+ bilateral LE edema Skin: warm and dry Neuro:  CNs 2-12 intact, no focal abnormalities noted  EKG:  Atrial fibrillation, HR 83  Holter Monitor:  Mean heart rate 101     ASSESSMENT AND PLAN:  1. Acute on Chronic Diastolic CHF: He remains volume overloaded. I will adjust his Lasix to 60 mg daily. Check a followup basic metabolic panel in one week.  He is planning to get compression stockings as well.   2. Atrial Fibrillation:  Rate is controlled.  Continue Eliquis.  The patient and his POA have many concerns and questions regarding AFib and its contribution to CHF and how his thyroid is impacting this.  As noted, I spent 20  minutes counseling them today.  It is quite possible his AFib is from his acute illness with C.Diff in 09/2012.  I do not see an ECG or tele done in the hospital in the chart to document NSR.  Subclinical hyperthyroidism may be impacting his rhythm as well.  He is certainly at risk for AFib aside from these issues given his advanced age and long hx of HTN.  He  started Eliquis 7/12.  The earliest we could consider DCCV is next week.  Will get an ECG when he returns for his labs.  I will review with Dr. Rollene Rotunda regarding whether we should pursue DCCV now vs wait for further assessment of his thyroid.   3. Subclinical Hyperthyroidism:  F/u with PCP.  Recent thyroid nodule bx neg for malignancy. 4. Hypertension:  Controlled.  Continue current therapy.  He has been on Norvasc for a long time.  I do not think this is the sole cause of his edema.   5. Hyperlipidemia:  Continue statin. 6. Disposition:  F/u with Dr. Rollene Rotunda or me in 3-4 weeks.   Signed, Tereso Newcomer, PA-C  02/09/2013 3:50 PM

## 2013-02-09 NOTE — Patient Instructions (Addendum)
INCREASE LASIX TO 60 MG DAILY  LAB AND NURSE VISIT 1 WEEK FOR BMET AND EKG; POSSIBLE CARDIOVERSION  PLEASE FOLLOW UP WITH DR. HOCHREIN IN 3-4 WEEKS OR SCOTT WEAVER, PAC SAME DAY DR. Antoine Poche IS IN THE OFFICE

## 2013-02-11 DIAGNOSIS — R269 Unspecified abnormalities of gait and mobility: Secondary | ICD-10-CM | POA: Diagnosis not present

## 2013-02-11 DIAGNOSIS — IMO0001 Reserved for inherently not codable concepts without codable children: Secondary | ICD-10-CM | POA: Diagnosis not present

## 2013-02-11 DIAGNOSIS — E119 Type 2 diabetes mellitus without complications: Secondary | ICD-10-CM | POA: Diagnosis not present

## 2013-02-11 DIAGNOSIS — I10 Essential (primary) hypertension: Secondary | ICD-10-CM | POA: Diagnosis not present

## 2013-02-11 DIAGNOSIS — M159 Polyosteoarthritis, unspecified: Secondary | ICD-10-CM | POA: Diagnosis not present

## 2013-02-15 DIAGNOSIS — I10 Essential (primary) hypertension: Secondary | ICD-10-CM | POA: Diagnosis not present

## 2013-02-15 DIAGNOSIS — R269 Unspecified abnormalities of gait and mobility: Secondary | ICD-10-CM | POA: Diagnosis not present

## 2013-02-15 DIAGNOSIS — E119 Type 2 diabetes mellitus without complications: Secondary | ICD-10-CM | POA: Diagnosis not present

## 2013-02-15 DIAGNOSIS — M159 Polyosteoarthritis, unspecified: Secondary | ICD-10-CM | POA: Diagnosis not present

## 2013-02-15 DIAGNOSIS — IMO0001 Reserved for inherently not codable concepts without codable children: Secondary | ICD-10-CM | POA: Diagnosis not present

## 2013-02-16 ENCOUNTER — Other Ambulatory Visit: Payer: Self-pay

## 2013-02-17 ENCOUNTER — Encounter: Payer: Self-pay | Admitting: *Deleted

## 2013-02-17 ENCOUNTER — Telehealth: Payer: Self-pay | Admitting: *Deleted

## 2013-02-17 ENCOUNTER — Other Ambulatory Visit (INDEPENDENT_AMBULATORY_CARE_PROVIDER_SITE_OTHER): Payer: Medicare Other

## 2013-02-17 ENCOUNTER — Ambulatory Visit (INDEPENDENT_AMBULATORY_CARE_PROVIDER_SITE_OTHER): Payer: Medicare Other | Admitting: *Deleted

## 2013-02-17 VITALS — BP 92/48 | HR 96 | Wt 146.8 lb

## 2013-02-17 DIAGNOSIS — I4891 Unspecified atrial fibrillation: Secondary | ICD-10-CM

## 2013-02-17 DIAGNOSIS — I10 Essential (primary) hypertension: Secondary | ICD-10-CM | POA: Diagnosis not present

## 2013-02-17 DIAGNOSIS — I5033 Acute on chronic diastolic (congestive) heart failure: Secondary | ICD-10-CM

## 2013-02-17 LAB — BASIC METABOLIC PANEL
CO2: 27 mEq/L (ref 19–32)
Calcium: 9.7 mg/dL (ref 8.4–10.5)
Glucose, Bld: 93 mg/dL (ref 70–99)
Potassium: 4.6 mEq/L (ref 3.5–5.1)
Sodium: 141 mEq/L (ref 135–145)

## 2013-02-17 NOTE — Progress Notes (Addendum)
Addendum 02/17/2013 5:04 PM:  Patient in today for labs. ECG demonstrates AFib. Reviewed case with Dr. Verne Carrow (DOD). It is reasonable to pursue DCCV to see if we can restore NSR.  He has been on Eliquis for > 3 weeks now. Will arrange elective DCCV next week. Signed,  Tereso Newcomer, PA-C   02/17/2013 5:05 PM

## 2013-02-17 NOTE — Addendum Note (Signed)
Addended byAlben Spittle, Lorin Picket T on: 02/17/2013 05:10 PM   Modules accepted: Orders

## 2013-02-17 NOTE — Telephone Encounter (Signed)
Per Scott's review with Dr. Clifton James of the patient's history and EKG today, they do recommend DCCV. The patient intially wanted to go on Monday 8/11, but rescheduled to Tuesday 8/12. This is set for 1:00 pm with Dr. Patty Sermons. Instructions have been given to the patient via phone. He verbalizes understanding. BMP reviewed with Scott from today. BUN slightly elevated. Verified with the patient he is taking lasix 60 mg once daily. Per Scott, decrease lasix to 40 mg once daily. The patient is aware.

## 2013-02-17 NOTE — Progress Notes (Signed)
Patient came in for EKG and lab work per [facility] PA to f/u AFib. Scott reviewed EKG and he will review with DOD for possible cardioversion.

## 2013-02-17 NOTE — H&P (Signed)
Case reviewed with Tereso Newcomer, PA-C. Agree. cdm

## 2013-02-17 NOTE — Progress Notes (Signed)
Agree Tereso Newcomer, PA-C   02/17/2013 5:16 PM

## 2013-02-17 NOTE — Patient Instructions (Addendum)
Your physician has recommended you make the following change in your medication: 1.  HOLD NORVASC  We will call you with further instructions regarding potential cardioversion

## 2013-02-17 NOTE — H&P (Signed)
History and Physical  Date:  02/09/2013   ID:  Logan Bean, Logan Bean [birth year], MRN [id number]  PCP:  Oneal Grout, MD  Cardiologist:  Dr. Rollene Rotunda     History of Present Illness: Logan Bean is a 79 y.o. male who returns for follow up,  He has a hx of HTN, HL, DM2. He recently developed LE edema/volume overload after a prolonged hospitalization in 09/2012 which required rehab after discharge.  He had shoulder surgery, then fx his ankle.  He underwent ORIF.  Hospital stay was c/b C. Diff colitis.  He recently established with Dr. Rollene Rotunda and was noted to be in atrial fibrillation. He has been treated with diuretics. Echocardiogram 01/2013: EF 50-55%, normal wall motion, MAC, moderate MR, mild BAE, mild RVE, PASP 33, trivial effusion. Patient was placed on Eliquis for anticoagulation (CHADS2-VASc=5).  Labs indicated subclinical hyperthyroidism. Thyroid nodule has been biopsied and this was negative for malignancy. His PCP is following.  Patient is here with his POA.  They have several questions about AFib, CHF, hyperthyroidism.  I tried to answer them all and spent 20 minutes counseling the patient and his POA today.  He continues to note DOE.  He walks short distances with a walker.  He is probably NYHA Class IIb-III.  He denies orthopnea, PND.  His LE edema is unchanged.  He does note improvement after his legs are elevated, but they are worse at the end of the day.  He does have a Rx for compression hose.  He does not have these yet.  No CP or syncope.    Labs (7/14):   K 4.6, creatinine 1.13=> 1.28, ALT 31, Hgb 11.2, TSH 0.24, free T4 1.32, free T3 124.2  Wt Readings from Last 3 Encounters:  02/09/13 150 lb (68.04 kg)  01/21/13 149 lb 1.9 oz (67.64 kg)  01/19/13 151 lb 3.2 oz (68.584 kg)     Past Medical History  Diagnosis Date  . Hypertension   . Hypercholesteremia   . Stomach ulcer     years  . H/O hiatal hernia   . Varicose veins     both legs  . Arthritis     left  knee  . Diabetes mellitus     diet controlled, pt does not check cbg  . Left rotator cuff tear Jul 12, 2012  . Edema   . Unspecified hereditary and idiopathic peripheral neuropathy   . Dizziness and giddiness   . Unspecified vitamin D deficiency   . Diaphragmatic hernia without mention of obstruction or gangrene     Current Outpatient Prescriptions  Medication Sig Dispense Refill  . acetaminophen (TYLENOL) 650 MG CR tablet Take 650 mg by mouth daily as needed (joint pain).      Marland Kitchen amLODipine (NORVASC) 5 MG tablet Take one tablet once a day for blood pressure  30 tablet  5  . apixaban (ELIQUIS) 5 MG TABS tablet Take 1 tablet (5 mg total) by mouth 2 (two) times daily.  60 tablet  6  . Calcium Carbonate-Vitamin D 600-200 MG-UNIT TABS Take by mouth 2 (two) times daily. Take 1 tablet daily for vitamin supplement.      . furosemide (LASIX) 40 MG tablet Take 1 tablet (40 mg total) by mouth 2 (two) times daily.  60 tablet  5  . Glucosamine-Chondroit-Vit C-Mn (GLUCOSAMINE 1500 COMPLEX) CAPS Take one tablet once a day  30 each  0  . glucose blood test strip Use as instructed  100 each  12  .  glucose monitoring kit (FREESTYLE) monitoring kit 1 each by Does not apply route as needed for other. Take blood sugar once daily.  1 each  0  . lisinopril (PRINIVIL,ZESTRIL) 20 MG tablet Take 1 tablet (20 mg total) by mouth daily. Take 1 tablet by mouth daily for blood pressure.  30 tablet  5  . metFORMIN (GLUCOPHAGE) 500 MG tablet Take 500 mg by mouth daily with breakfast.      . metoprolol tartrate (LOPRESSOR) 12.5 mg TABS Take 0.5 tablets (12.5 mg total) by mouth 2 (two) times daily.  60 tablet  3  . Multiple Vitamin (MULITIVITAMIN WITH MINERALS) TABS Take 1 tablet by mouth daily. Take 1 tablet daily for vitamin supplement.      Marland Kitchen omeprazole (PRILOSEC) 20 MG capsule Take 1 capsule (20 mg total) by mouth daily before breakfast. Take 1 tablet daily for reflux.  30 capsule  5  . potassium chloride SA  (K-DUR,KLOR-CON) 20 MEQ tablet Take 1 tablet (20 mEq total) by mouth daily.  30 tablet  3  . simvastatin (ZOCOR) 10 MG tablet Take 1 tablet (10 mg total) by mouth at bedtime.  90 tablet  3   No current facility-administered medications for this visit.    Allergies:    Allergies  Allergen Reactions  . Morphine And Related Nausea And Vomiting  . Oxycodone Nausea And Vomiting    Social History:  The patient  reports that he quit smoking about 16 years ago. His smoking use included Pipe and Cigarettes. He smoked 0.00 packs per day for 50 years. He has never used smokeless tobacco. He reports that  drinks alcohol. He reports that he does not use illicit drugs.   ROS:  Please see the history of present illness.   No melena, hematochezia, hematuria.  He has a mild NP cough.   All other systems reviewed and negative.   PHYSICAL EXAM: VS:  BP 117/63  Pulse 83  Ht 5\' 7"  (1.702 m)  Wt 150 lb (68.04 kg)  BMI 23.49 kg/m2 Well nourished, well developed, in no acute distress HEENT: normal Neck: + JVD at 90 degrees Cardiac:  normal S1, S2; irreg irreg rhythm; no murmur Lungs:  clear to auscultation bilaterally, no wheezing, rhonchi or rales Abd: soft, nontender, no hepatomegaly Ext: 1-2+ bilateral LE edema Skin: warm and dry Neuro:  CNs 2-12 intact, no focal abnormalities noted  EKG:  Atrial fibrillation, HR 83  Holter Monitor:  Mean heart rate 101     ASSESSMENT AND PLAN:  1. Acute on Chronic Diastolic CHF: He remains volume overloaded. I will adjust his Lasix to 60 mg daily. Check a followup basic metabolic panel in one week.  He is planning to get compression stockings as well.   2. Atrial Fibrillation:  Rate is controlled.  Continue Eliquis.  The patient and his POA have many concerns and questions regarding AFib and its contribution to CHF and how his thyroid is impacting this.  As noted, I spent 20 minutes counseling them today.  It is quite possible his AFib is from his acute illness  with C.Diff in 09/2012.  I do not see an ECG or tele done in the hospital in the chart to document NSR.  Subclinical hyperthyroidism may be impacting his rhythm as well.  He is certainly at risk for AFib aside from these issues given his advanced age and long hx of HTN.  He started Eliquis 7/12.  The earliest we could consider DCCV is next week.  Will get an ECG when he returns for his labs.  I will review with Dr. Rollene Rotunda regarding whether we should pursue DCCV now vs wait for further assessment of his thyroid.   3. Subclinical Hyperthyroidism:  F/u with PCP.  Recent thyroid nodule bx neg for malignancy. 4. Hypertension:  Controlled.  Continue current therapy.  He has been on Norvasc for a long time.  I do not think this is the sole cause of his edema.   5. Hyperlipidemia:  Continue statin. 6. Disposition:  F/u with Dr. Rollene Rotunda or me in 3-4 weeks.   Signed, Tereso Newcomer, PA-C  02/09/2013 3:50 PM    Addendum 02/17/2013 5:04 PM:  Patient in today for labs. ECG demonstrates AFib. Reviewed case with Dr. Verne Carrow (DOD). It is reasonable to pursue DCCV to see if we can restore NSR.  He has been on Eliquis for > 3 weeks now. Will arrange elective DCCV next week.  Signed,  Tereso Newcomer, PA-C   02/17/2013 5:05 PM

## 2013-02-22 ENCOUNTER — Encounter (HOSPITAL_COMMUNITY): Payer: Self-pay | Admitting: Gastroenterology

## 2013-02-22 ENCOUNTER — Encounter (HOSPITAL_COMMUNITY): Payer: Self-pay | Admitting: Anesthesiology

## 2013-02-22 ENCOUNTER — Encounter (HOSPITAL_COMMUNITY)
Admission: RE | Disposition: A | Discharge status: Home / Self Care | Payer: Self-pay | Source: Ambulatory Visit | Attending: Cardiology

## 2013-02-22 ENCOUNTER — Ambulatory Visit (HOSPITAL_COMMUNITY)
Admission: RE | Admit: 2013-02-22 | Discharge: 2013-02-22 | Disposition: A | Discharge status: Home / Self Care | Payer: Medicare Other | Source: Ambulatory Visit | Attending: Cardiology | Admitting: Cardiology

## 2013-02-22 ENCOUNTER — Ambulatory Visit (HOSPITAL_COMMUNITY): Payer: Medicare Other | Admitting: Anesthesiology

## 2013-02-22 DIAGNOSIS — I509 Heart failure, unspecified: Secondary | ICD-10-CM | POA: Insufficient documentation

## 2013-02-22 DIAGNOSIS — E0500 Thyrotoxicosis with diffuse goiter without thyrotoxic crisis or storm: Secondary | ICD-10-CM | POA: Insufficient documentation

## 2013-02-22 DIAGNOSIS — I4891 Unspecified atrial fibrillation: Secondary | ICD-10-CM | POA: Insufficient documentation

## 2013-02-22 DIAGNOSIS — Z79899 Other long term (current) drug therapy: Secondary | ICD-10-CM | POA: Diagnosis not present

## 2013-02-22 DIAGNOSIS — G609 Hereditary and idiopathic neuropathy, unspecified: Secondary | ICD-10-CM | POA: Diagnosis not present

## 2013-02-22 DIAGNOSIS — E119 Type 2 diabetes mellitus without complications: Secondary | ICD-10-CM | POA: Diagnosis not present

## 2013-02-22 DIAGNOSIS — M1710 Unilateral primary osteoarthritis, unspecified knee: Secondary | ICD-10-CM | POA: Diagnosis not present

## 2013-02-22 DIAGNOSIS — Z7901 Long term (current) use of anticoagulants: Secondary | ICD-10-CM | POA: Diagnosis not present

## 2013-02-22 DIAGNOSIS — Z87891 Personal history of nicotine dependence: Secondary | ICD-10-CM | POA: Diagnosis not present

## 2013-02-22 DIAGNOSIS — I10 Essential (primary) hypertension: Secondary | ICD-10-CM | POA: Insufficient documentation

## 2013-02-22 DIAGNOSIS — E785 Hyperlipidemia, unspecified: Secondary | ICD-10-CM | POA: Diagnosis not present

## 2013-02-22 DIAGNOSIS — I5033 Acute on chronic diastolic (congestive) heart failure: Secondary | ICD-10-CM | POA: Diagnosis not present

## 2013-02-22 DIAGNOSIS — I8390 Asymptomatic varicose veins of unspecified lower extremity: Secondary | ICD-10-CM | POA: Insufficient documentation

## 2013-02-22 DIAGNOSIS — K219 Gastro-esophageal reflux disease without esophagitis: Secondary | ICD-10-CM | POA: Diagnosis not present

## 2013-02-22 DIAGNOSIS — E559 Vitamin D deficiency, unspecified: Secondary | ICD-10-CM | POA: Insufficient documentation

## 2013-02-22 DIAGNOSIS — K449 Diaphragmatic hernia without obstruction or gangrene: Secondary | ICD-10-CM | POA: Insufficient documentation

## 2013-02-22 DIAGNOSIS — Z885 Allergy status to narcotic agent status: Secondary | ICD-10-CM | POA: Diagnosis not present

## 2013-02-22 HISTORY — PX: CARDIOVERSION: SHX1299

## 2013-02-22 LAB — GLUCOSE, CAPILLARY: Glucose-Capillary: 94 mg/dL (ref 70–99)

## 2013-02-22 SURGERY — CARDIOVERSION
Anesthesia: Monitor Anesthesia Care

## 2013-02-22 MED ORDER — SODIUM CHLORIDE 0.9 % IV SOLN
INTRAVENOUS | Status: DC | PRN
  Administered 2013-02-22: 500 mL
  Administered 2013-02-22: 13:00:00 via INTRAVENOUS

## 2013-02-22 MED ORDER — PROPOFOL 10 MG/ML IV BOLUS
INTRAVENOUS | Status: DC | PRN
  Administered 2013-02-22: 40 mg via INTRAVENOUS

## 2013-02-22 MED ORDER — APIXABAN 5 MG PO TABS
5.0000 mg | ORAL_TABLET | ORAL | Status: AC
  Administered 2013-02-22: 5 mg via ORAL
  Filled 2013-02-22: qty 1

## 2013-02-22 NOTE — Anesthesia Preprocedure Evaluation (Signed)
Anesthesia Evaluation  Patient identified by MRN, date of birth, ID band Patient awake    Reviewed: Allergy & Precautions, H&P , NPO status , Patient's Chart, lab work & pertinent test results, reviewed documented beta blocker date and time   Airway Mallampati: I TM Distance: >3 FB Neck ROM: Full    Dental  (+) Teeth Intact and Poor Dentition,    Pulmonary shortness of breath,          Cardiovascular hypertension, +CHF + dysrhythmias Atrial Fibrillation Rhythm:Irregular Rate:Tachycardia     Neuro/Psych    GI/Hepatic hiatal hernia, PUD, GERD-  Controlled and Medicated,  Endo/Other  diabetes, Type 2, Oral Hypoglycemic Agents  Renal/GU      Musculoskeletal   Abdominal Normal abdominal exam  (+)   Peds  Hematology   Anesthesia Other Findings   Reproductive/Obstetrics                           Anesthesia Physical Anesthesia Plan  ASA: III  Anesthesia Plan: MAC   Post-op Pain Management:    Induction: Intravenous  Airway Management Planned: Mask  Additional Equipment:   Intra-op Plan:   Post-operative Plan:   Informed Consent: I have reviewed the patients History and Physical, chart, labs and discussed the procedure including the risks, benefits and alternatives for the proposed anesthesia with the patient or authorized representative who has indicated his/her understanding and acceptance.   Dental advisory given  Plan Discussed with: CRNA, Anesthesiologist and Surgeon  Anesthesia Plan Comments:         Anesthesia Quick Evaluation

## 2013-02-22 NOTE — Anesthesia Postprocedure Evaluation (Signed)
  Anesthesia Post-op Note  Patient: Logan Bean  Procedure(s) Performed: Procedure(s): CARDIOVERSION (N/A)  Patient Location: PACU  Anesthesia Type:General  Level of Consciousness: awake, alert  and oriented  Airway and Oxygen Therapy: Patient Spontanous Breathing and Patient connected to nasal cannula oxygen  Post-op Pain: mild  Post-op Assessment: Post-op Vital signs reviewed, Patient's Cardiovascular Status Stable, Respiratory Function Stable and Patent Airway  Post-op Vital Signs: stable  Complications: No apparent anesthesia complications

## 2013-02-22 NOTE — Preoperative (Signed)
Beta Blockers   Reason not to administer Beta Blockers:Not Applicable 

## 2013-02-22 NOTE — CV Procedure (Signed)
Electrical Cardioversion Procedure Note Averie Louque [id number] 1920-11-24  Procedure: Electrical Cardioversion Indications:  Atrial Fibrillation  Procedure Details Consent: Risks of procedure as well as the alternatives and risks of each were explained to the (patient/caregiver).  Consent for procedure obtained. Time Out: Verified patient identification, verified procedure, site/side was marked, verified correct patient position, special equipment/implants available, medications/allergies/relevent history reviewed, required imaging and test results available.  Performed  Patient placed on cardiac monitor, pulse oximetry, supplemental oxygen as necessary.  Sedation given: propofol 40 mg Pacer pads placed anterior and posterior chest.  Cardioverted 2 time(s).  Cardioverted at 150J.  Evaluation Findings: Post procedure EKG shows: NSR Complications: Frequent PACs Patient did tolerate procedure well.   Cassell Clement 02/22/2013, 1:25 PM

## 2013-02-22 NOTE — Anesthesia Postprocedure Evaluation (Signed)
  Anesthesia Post-op Note  Patient: Logan Bean  Procedure(s) Performed: Procedure(s): CARDIOVERSION (N/A)  Patient Location: PACU  Anesthesia Type:MAC  Level of Consciousness: awake, alert  and oriented  Airway and Oxygen Therapy: Patient Spontanous Breathing  Post-op Pain: none  Post-op Assessment: Post-op Vital signs reviewed  Post-op Vital Signs: Reviewed and stable  Complications: No apparent anesthesia complications

## 2013-02-22 NOTE — Transfer of Care (Signed)
Immediate Anesthesia Transfer of Care Note  Patient: Logan Bean  Procedure(s) Performed: Procedure(s): CARDIOVERSION (N/A)  Patient Location: PACU  Anesthesia Type:MAC  Level of Consciousness: awake, alert  and oriented  Airway & Oxygen Therapy: Patient Spontanous Breathing  Post-op Assessment: Report given to PACU RN  Post vital signs: Reviewed and stable  Complications: No apparent anesthesia complications

## 2013-02-23 ENCOUNTER — Telehealth: Payer: Self-pay | Admitting: Cardiology

## 2013-02-23 ENCOUNTER — Encounter (HOSPITAL_COMMUNITY): Payer: Self-pay | Admitting: Cardiology

## 2013-02-23 NOTE — Telephone Encounter (Signed)
New problem    Pt was told to hold BP medicine before procedure..pt wants to know if he should resume taking BP meds.  (msg taken per [employer] computer was down)

## 2013-02-24 NOTE — Telephone Encounter (Signed)
Per pt - was instructed to not take Norvasc due to edema wants to know if he should restart it.   Advised pt to remain off until he is seen in follow up at scheduled appt.  Per family member he stated understanding.

## 2013-02-28 ENCOUNTER — Institutional Professional Consult (permissible substitution): Payer: Medicare Other | Admitting: Cardiology

## 2013-03-01 ENCOUNTER — Telehealth: Payer: Self-pay | Admitting: Cardiology

## 2013-03-01 NOTE — Telephone Encounter (Signed)
Per pt - he noted he feels like he went back into At Fib about 48 hours after his cardioversion.  Advised pt to continue medications as instructed and that he will be re-evaluated at the time of his follow up appt.  He states understanding.

## 2013-03-01 NOTE — Telephone Encounter (Signed)
New Problem   Pt wants to report that the conversion was not a success. Pt wants to speak with a nurse to discus

## 2013-03-02 ENCOUNTER — Ambulatory Visit
Admission: RE | Admit: 2013-03-02 | Discharge: 2013-03-02 | Disposition: A | Discharge status: Home / Self Care | Payer: Medicare Other | Source: Ambulatory Visit | Attending: Internal Medicine | Admitting: Internal Medicine

## 2013-03-02 ENCOUNTER — Encounter: Payer: Self-pay | Admitting: Internal Medicine

## 2013-03-02 ENCOUNTER — Ambulatory Visit (INDEPENDENT_AMBULATORY_CARE_PROVIDER_SITE_OTHER): Payer: Medicare Other | Admitting: Internal Medicine

## 2013-03-02 VITALS — BP 118/62 | HR 68 | Temp 97.6°F | Resp 14 | Ht 67.0 in | Wt 143.8 lb

## 2013-03-02 DIAGNOSIS — I4891 Unspecified atrial fibrillation: Secondary | ICD-10-CM | POA: Diagnosis not present

## 2013-03-02 DIAGNOSIS — J90 Pleural effusion, not elsewhere classified: Secondary | ICD-10-CM | POA: Diagnosis not present

## 2013-03-02 DIAGNOSIS — E119 Type 2 diabetes mellitus without complications: Secondary | ICD-10-CM

## 2013-03-02 DIAGNOSIS — I10 Essential (primary) hypertension: Secondary | ICD-10-CM

## 2013-03-02 DIAGNOSIS — I509 Heart failure, unspecified: Secondary | ICD-10-CM

## 2013-03-02 DIAGNOSIS — E0590 Thyrotoxicosis, unspecified without thyrotoxic crisis or storm: Secondary | ICD-10-CM | POA: Diagnosis not present

## 2013-03-02 NOTE — Progress Notes (Signed)
Patient ID: Logan Bean, male   DOB: 05-30-1921, 79 y.o.   MRN: [id number]  Chief Complaint  Patient presents with  . Medical Managment of Chronic Issues   HPI 79 y/o male patient is here with his POA. He recently underwent cardioversion for his afib but has converted back to afib. He denies any chest pain or palpitations.  His breathing status is improved from dyspnea at rest but gets dyspneic with exertion.   His cbg has been between 96-112. He has been taking metformin 500 mg bid instead of prescribed 500 mg daily  He is compliant with his other medications  No further muscle cramps  Reflux is under control  Swelling in his legs have subsided  Review of Systems   Constitutional: Negative for malaise/fatigue.   Respiratory: Negative for cough   Cardiovascular: Negative for chest pain and orthopnea and PND Gastrointestinal: Negative for heartburn, abdominal pain, diarrhea and constipation.   Musculoskeletal: Negative for myalgias and joint pain.   Skin: Negative.    Neurological: Negative for headaches.   Psychiatric/Behavioral: Negative for depression. The patient does not have insomnia  Allergies  Allergen Reactions  . Morphine And Related Nausea And Vomiting  . Oxycodone Nausea And Vomiting    Physical exam  BP 118/62  Pulse 68  Temp(Src) 97.6 F (36.4 C) (Oral)  Resp 14  Ht 5\' 7"  (1.702 m)  Wt 143 lb 12.8 oz (65.227 kg)  BMI 22.52 kg/m2  Well nourished, well developed, in no acute distress  HEENT: normal  Neck: no JVD, no palpable LAD Cardiac:  normal S1, S2; irreg irreg rhythm; no murmur  Lungs:  clear to auscultation bilaterally, no wheezing, rhonchi or rales  Abd: soft, nontender, no hepatomegaly  Ext: trace b/l pedal edema Skin: warm and dry  Neuro:  CNs 2-12 intact, no focal abnormalities noted   ASSESSMENT AND PLAN:  afib- rate controlled, continue lopressor. Will continue eliquis. Has upcoming cardiology appointment next week  Subclinical  hyperthyroidism- recheck tsh and free T3. Malignancy ruled out for thyroid nodule by biopsy  Thyroid nodule- benign in biopsy result. Will recheck complete thyroid panel  CHF- improved symptoms. Check bmp. Taking lasix 20-40 mg daily. He checks bp at home and depending on the reading ad his swelling self adjusts the dose. Continue lopressor, lisinopril and statin  DM- will change his metformin to 500 mg daily, informed pt. Monitor cbg. Recheck a1c prior to next visit. Continue statin and lisinopril  GERD- continue omeprazole for now  HTN- bp well controlled. Continue current regimen  Hypokalemia- continue kcl supplement  Pleural effusion- recheck cxr to assess if loculated effusion have resolved.

## 2013-03-03 ENCOUNTER — Other Ambulatory Visit: Payer: Self-pay | Admitting: Internal Medicine

## 2013-03-03 ENCOUNTER — Other Ambulatory Visit: Payer: Self-pay | Admitting: *Deleted

## 2013-03-03 DIAGNOSIS — E0590 Thyrotoxicosis, unspecified without thyrotoxic crisis or storm: Secondary | ICD-10-CM

## 2013-03-03 LAB — BASIC METABOLIC PANEL
BUN/Creatinine Ratio: 29 — ABNORMAL HIGH (ref 10–22)
BUN: 36 mg/dL (ref 10–36)
Calcium: 9.8 mg/dL (ref 8.6–10.2)
Creatinine, Ser: 1.24 mg/dL (ref 0.76–1.27)
GFR calc non Af Amer: 51 mL/min/{1.73_m2} — ABNORMAL LOW (ref >59)
Glucose: 85 mg/dL (ref 65–99)
Potassium: 5.3 mmol/L — ABNORMAL HIGH (ref 3.5–5.2)
Sodium: 141 mmol/L (ref 134–144)

## 2013-03-03 LAB — TSH: TSH: 0.077 u[IU]/mL — ABNORMAL LOW (ref 0.450–4.500)

## 2013-03-03 LAB — T4: T4, Total: 8.1 ug/dL (ref 4.5–12.0)

## 2013-03-09 ENCOUNTER — Encounter: Payer: Self-pay | Admitting: Physician Assistant

## 2013-03-09 ENCOUNTER — Ambulatory Visit (INDEPENDENT_AMBULATORY_CARE_PROVIDER_SITE_OTHER): Payer: Medicare Other | Admitting: Physician Assistant

## 2013-03-09 VITALS — BP 122/71 | HR 111 | Ht 65.0 in | Wt 147.0 lb

## 2013-03-09 DIAGNOSIS — I10 Essential (primary) hypertension: Secondary | ICD-10-CM

## 2013-03-09 DIAGNOSIS — I4891 Unspecified atrial fibrillation: Secondary | ICD-10-CM | POA: Diagnosis not present

## 2013-03-09 DIAGNOSIS — E0590 Thyrotoxicosis, unspecified without thyrotoxic crisis or storm: Secondary | ICD-10-CM

## 2013-03-09 DIAGNOSIS — I5032 Chronic diastolic (congestive) heart failure: Secondary | ICD-10-CM

## 2013-03-09 MED ORDER — METOPROLOL TARTRATE 25 MG PO TABS: 25.0000 mg | ORAL_TABLET | Freq: Two times a day (BID) | ORAL | Status: DC

## 2013-03-09 NOTE — Progress Notes (Signed)
[address] [place], [place]  [address] Phone: [phone] Fax:  [phone]  Date:  03/09/2013   ID:  Logan Bean, Logan Bean 1920-11-22, MRN [id number]  PCP:  Logan Grout, MD  Cardiologist:  Dr. Rollene Bean     History of Present Illness: Logan Bean is a 79 y.o. male who returns for follow up after DCCV.  Logan Bean has a hx of HTN, HL, DM2. Logan Bean recently developed LE edema/volume overload after a prolonged hospitalization in 09/2012 which required rehab after discharge.  Logan Bean had shoulder surgery, then fx his ankle.  Logan Bean underwent ORIF.  Hospital stay was c/b C. Diff colitis.  Logan Bean recently established with Dr. Rollene Bean and was noted to be in atrial fibrillation. Logan Bean has been treated with diuretics. Echocardiogram 01/2013: EF 50-55%, normal wall motion, MAC, moderate MR, mild BAE, mild RVE, PASP 33, trivial effusion. Patient was placed on Eliquis for anticoagulation (CHADS2-VASc=5).  Labs indicated subclinical hyperthyroidism. Thyroid nodule has been biopsied and this was negative for malignancy. His PCP is following.  I saw him in follow up 02/09/13. Logan Bean continued to have difficulty with LE edema and dyspnea with exertion. I adjusted his diuretics. We ultimately decided to pursue cardioversion in order to restore normal sinus rhythm.  DCCV was performed 02/22/13 with restoration of NSR.  Logan Bean felt better after cardioversion. About 48 hours later, Logan Bean felt Logan Bean was back in atrial fibrillation. However, overall, his breathing is improved. His LE edema is improved. Today his edema has worsened somewhat. Right leg is always greater than the left. Logan Bean denies orthopnea or PND. Logan Bean denies syncope.  Of note, recent chest x-ray demonstrates improved effusion in right minor fissure.  Labs (7/14):   K 4.6, creatinine 1.13=> 1.28, ALT 31, Hgb 11.2, TSH 0.24, free T4 1.32, free T3 124.2 Labs (8/14):   K 4.6=> 5.3, creatinine 1.2, TSH 0.077, free T3 3.8, free T4 8.1  Wt Readings from Last 3  Encounters:  03/09/13 147 lb (66.679 kg)  03/02/13 143 lb 12.8 oz (65.227 kg)  02/17/13 146 lb 12.8 oz (66.588 kg)     Past Medical History  Diagnosis Date  . Hypertension   . Hypercholesteremia   . Stomach ulcer     years  . H/O hiatal hernia   . Varicose veins     both legs  . Arthritis     left knee  . Diabetes mellitus     diet controlled, pt does not check cbg  . Left rotator cuff tear Jul 12, 2012  . Edema   . Unspecified hereditary and idiopathic peripheral neuropathy   . Dizziness and giddiness   . Unspecified vitamin D deficiency   . Diaphragmatic hernia without mention of obstruction or gangrene     Current Outpatient Prescriptions  Medication Sig Dispense Refill  . acetaminophen (TYLENOL) 650 MG CR tablet Take 650 mg by mouth daily as needed (joint pain).      Marland Kitchen apixaban (ELIQUIS) 5 MG TABS tablet Take 1 tablet (5 mg total) by mouth 2 (two) times daily.  60 tablet  6  . Calcium Carbonate-Vitamin D 600-200 MG-UNIT TABS Take by mouth 2 (two) times daily. Take 1 tablet daily for vitamin supplement.      . furosemide (LASIX) 40 MG tablet as needed. Take one tablet by mouth daily      . Glucosamine-Chondroit-Vit C-Mn (GLUCOSAMINE 1500 COMPLEX) CAPS Take one tablet once a day  30 each  0  . glucose blood test strip  Use as instructed  100 each  12  . glucose monitoring kit (FREESTYLE) monitoring kit 1 each by Does not apply route as needed for other. Take blood sugar once daily.  1 each  0  . lisinopril (PRINIVIL,ZESTRIL) 20 MG tablet Take 1 tablet (20 mg total) by mouth daily. Take 1 tablet by mouth daily for blood pressure.  30 tablet  5  . metFORMIN (GLUCOPHAGE) 500 MG tablet Take 500 mg by mouth daily with breakfast.      . metoprolol tartrate (LOPRESSOR) 12.5 mg TABS Take 0.5 tablets (12.5 mg total) by mouth 2 (two) times daily.  60 tablet  3  . Multiple Vitamin (MULITIVITAMIN WITH MINERALS) TABS Take 1 tablet by mouth daily. Take 1 tablet daily for vitamin supplement.       Marland Kitchen omeprazole (PRILOSEC) 20 MG capsule Take 1 capsule (20 mg total) by mouth daily before breakfast. Take 1 tablet daily for reflux.  30 capsule  5  . potassium chloride SA (K-DUR,KLOR-CON) 20 MEQ tablet Take 1 tablet (20 mEq total) by mouth daily.  30 tablet  3  . simvastatin (ZOCOR) 10 MG tablet Take 1 tablet (10 mg total) by mouth at bedtime.  90 tablet  3   No current facility-administered medications for this visit.    Allergies:    Allergies  Allergen Reactions  . Morphine And Related Nausea And Vomiting  . Oxycodone Nausea And Vomiting    Social History:  The patient  reports that Logan Bean quit smoking about 16 years ago. His smoking use included Pipe and Cigarettes. Logan Bean smoked 0.00 packs per day for 50 years. Logan Bean has never used smokeless tobacco. Logan Bean reports that  drinks alcohol. Logan Bean reports that Logan Bean does not use illicit drugs.   ROS:  Please see the history of present illness.      All other systems reviewed and negative.   PHYSICAL EXAM: VS:  BP 122/71  Pulse 111  Ht 5\' 5"  (1.651 m)  Wt 147 lb (66.679 kg)  BMI 24.46 kg/m2 Well nourished, well developed, in no acute distress HEENT: normal Neck: no JVD at 90 degrees Cardiac:  normal S1, S2; irreg irreg rhythm; no murmur Lungs:  clear to auscultation bilaterally, no wheezing, rhonchi or rales Abd: soft, nontender, no hepatomegaly Ext: 1+ bilateral LE edema, R>L Skin: warm and dry Neuro:  CNs 2-12 intact, no focal abnormalities noted  EKG:  Atrial fibrillation, HR 111      ASSESSMENT AND PLAN:  1. Chronic Diastolic CHF:  Volume overall stable.  His LE edema is increased today.  Logan Bean will increase his Lasix to 40 mg for 3 days, then resume 20 mg QD.  Check a BMET in 1-2 weeks.     2. Atrial Fibrillation:  Logan Bean is back in atrial fibrillation. Overall, Logan Bean has had symptomatic improvement. Given his subclinical hyperthyroidism, I do not think Logan Bean is a candidate for amiodarone. Given his age, I suspect his only other option is Tikosyn. I  would recommend that strategy of rate control for the short-term while his thyroid disease is sorted out. I discussed this with the patient and his POA. They agreed.  Continue Eliquis. Arrange followup CBC. Increase metoprolol to 25 mg twice a day for rate control. 3. Subclinical Hyperthyroidism:  Logan Bean is being referred to endocrinology. 4. Hypertension:  Controlled.   5. Hyperlipidemia:  Continue statin. 6. Disposition:  F/u with me or Dr. Rollene Bean in 4 weeks.   Signed, Tereso Newcomer, PA-C  03/09/2013 12:38  PM

## 2013-03-09 NOTE — Patient Instructions (Addendum)
INCREASE METOPROLOL TO 25 MG TABLET TWICE DAILY  INCREASE LASIX FOR THE NEXT 2-3 DAYS TO 40 MG DAILY; THEN AFTER 2-3 DAYS RESUME THE 20 MG DAILY  LAB WORK IN 2 WEEKS FOR A BMET, CBC W/DIFF  PLEASE FOLLOW UP WITH DR. HOCHREIN OR SCOTT WEAVER, PAC SAME DAY DR. Antoine Poche IS INH THE OFFICE 4 WEEKS

## 2013-03-17 ENCOUNTER — Ambulatory Visit: Payer: Medicare Other | Admitting: Endocrinology

## 2013-03-18 ENCOUNTER — Telehealth: Payer: Self-pay | Admitting: *Deleted

## 2013-03-18 NOTE — Telephone Encounter (Signed)
03/02/2013--Chest X-Ray: Loculated effusion seen involving the right major fissure and lateral chest wall is improved compared to prior exam  Per Dr. Fleeta Emmer chest xray shows improved effusion compared to before. Will continue to monitor this periodically.   Amy Sedonia Small Notified patient on 03/03/2013.

## 2013-03-21 ENCOUNTER — Other Ambulatory Visit: Payer: Self-pay | Admitting: *Deleted

## 2013-03-21 ENCOUNTER — Other Ambulatory Visit (INDEPENDENT_AMBULATORY_CARE_PROVIDER_SITE_OTHER): Payer: Medicare Other

## 2013-03-21 DIAGNOSIS — I10 Essential (primary) hypertension: Secondary | ICD-10-CM | POA: Diagnosis not present

## 2013-03-21 DIAGNOSIS — E0590 Thyrotoxicosis, unspecified without thyrotoxic crisis or storm: Secondary | ICD-10-CM | POA: Diagnosis not present

## 2013-03-21 DIAGNOSIS — I4891 Unspecified atrial fibrillation: Secondary | ICD-10-CM

## 2013-03-21 DIAGNOSIS — E875 Hyperkalemia: Secondary | ICD-10-CM

## 2013-03-21 LAB — BASIC METABOLIC PANEL
BUN: 34 mg/dL — ABNORMAL HIGH (ref 6–23)
CO2: 26 mEq/L (ref 19–32)
Calcium: 9.5 mg/dL (ref 8.4–10.5)
Chloride: 111 mEq/L (ref 96–112)
Creatinine, Ser: 1.2 mg/dL (ref 0.4–1.5)

## 2013-03-21 LAB — CBC WITH DIFFERENTIAL/PLATELET
Basophils Absolute: 0 10*3/uL (ref 0.0–0.1)
Eosinophils Absolute: 0.3 10*3/uL (ref 0.0–0.7)
Lymphocytes Relative: 16.2 % (ref 12.0–46.0)
MCHC: 32.4 g/dL (ref 30.0–36.0)
MCV: 74.4 fl — ABNORMAL LOW (ref 78.0–100.0)
Monocytes Absolute: 0.7 10*3/uL (ref 0.1–1.0)
Neutrophils Relative %: 71.6 % (ref 43.0–77.0)
Platelets: 240 10*3/uL (ref 150.0–400.0)
RDW: 20.5 % — ABNORMAL HIGH (ref 11.5–14.6)

## 2013-03-23 ENCOUNTER — Encounter: Payer: Self-pay | Admitting: Endocrinology

## 2013-03-23 ENCOUNTER — Ambulatory Visit (INDEPENDENT_AMBULATORY_CARE_PROVIDER_SITE_OTHER): Payer: Medicare Other | Admitting: Endocrinology

## 2013-03-23 ENCOUNTER — Telehealth: Payer: Self-pay | Admitting: Endocrinology

## 2013-03-23 VITALS — BP 122/72 | HR 83 | Resp 10 | Ht 65.25 in | Wt 148.0 lb

## 2013-03-23 DIAGNOSIS — E042 Nontoxic multinodular goiter: Secondary | ICD-10-CM

## 2013-03-23 DIAGNOSIS — E0590 Thyrotoxicosis, unspecified without thyrotoxic crisis or storm: Secondary | ICD-10-CM | POA: Diagnosis not present

## 2013-03-23 NOTE — Telephone Encounter (Signed)
Pt did not sch 3 month follow up at check out, he wants to get testing completed first and go from there / Sherri S.

## 2013-03-23 NOTE — Telephone Encounter (Signed)
Please read note below and advise.  

## 2013-03-23 NOTE — Progress Notes (Signed)
Patient ID: Logan Bean, male   DOB: Jul 06, 1921, 79 y.o.   MRN: [id number]  Reason for Appointment:  Hyperthyroidism, new visit    History of Present Illness:   He had an incidental diagnosis of thyroid enlargement with substernal extension when he had a chest CT scan to evaluate a lung nodule followed on chest x-ray in March Ultrasound showed a 5.2 cm solid right nodule with central necrosis and small left-sided nodules. Biopsy of this was benign. Subsequently thyroid levels were also done when he was having problems with his atrial arrhythmias which showed low TSH in July and subsequent TSH in August was lower.  He has had nonspecific complaints of shortness of breath and most of the time especially when he is showering. He also has had swelling of his feet off and on He has known atrial fibrillation but does not feel any palpitations No symptoms of shakiness, nervousness, feeling excessively warm and sweaty, and fatigue. Not losing any  weight this year and his appetite is fairly good   He is now referred here for further evaluation   The labs results are as follows:  Lab Results  Component Value Date   FREET4 1.32 01/21/2013   TSH 0.077* 03/02/2013   Lab Results  Component Value Date   TSH 0.077* 03/02/2013   TSH 0.24* 01/21/2013   TSH 0.431* 01/19/2013     Total T3 and free T3 are normal for the last 2 months  Appointment on 03/21/2013  Component Date Value Range Status  . Sodium 03/21/2013 141  135 - 145 mEq/L Final  . Potassium 03/21/2013 5.6* 3.5 - 5.1 mEq/L Final  . Chloride 03/21/2013 111  96 - 112 mEq/L Final  . CO2 03/21/2013 26  19 - 32 mEq/L Final  . Glucose, Bld 03/21/2013 106* 70 - 99 mg/dL Final  . BUN 40/98/1191 34* 6 - 23 mg/dL Final  . Creatinine, Ser 03/21/2013 1.2  0.4 - 1.5 mg/dL Final  . Calcium 47/82/9562 9.5  8.4 - 10.5 mg/dL Final  . GFR 13/02/6577 62.17  >60.00 mL/min Final  . WBC 03/21/2013 8.1  4.5 - 10.5 K/uL Final  . RBC 03/21/2013 4.56  4.22 -  5.81 Mil/uL Final  . Hemoglobin 03/21/2013 11.0* 13.0 - 17.0 g/dL Final  . HCT 46/96/2952 33.9* 39.0 - 52.0 % Final  . MCV 03/21/2013 74.4* 78.0 - 100.0 fl Final  . MCHC 03/21/2013 32.4  30.0 - 36.0 g/dL Final  . RDW 84/13/2440 20.5* 11.5 - 14.6 % Final  . Platelets 03/21/2013 240.0  150.0 - 400.0 K/uL Final  . Neutrophils Relative % 03/21/2013 71.6  43.0 - 77.0 % Final  . Lymphocytes Relative 03/21/2013 16.2  12.0 - 46.0 % Final  . Monocytes Relative 03/21/2013 8.5  3.0 - 12.0 % Final  . Eosinophils Relative 03/21/2013 3.3  0.0 - 5.0 % Final  . Basophils Relative 03/21/2013 0.4  0.0 - 3.0 % Final  . Neutro Abs 03/21/2013 5.8  1.4 - 7.7 K/uL Final  . Lymphs Abs 03/21/2013 1.3  0.7 - 4.0 K/uL Final  . Monocytes Absolute 03/21/2013 0.7  0.1 - 1.0 K/uL Final  . Eosinophils Absolute 03/21/2013 0.3  0.0 - 0.7 K/uL Final  . Basophils Absolute 03/21/2013 0.0  0.0 - 0.1 K/uL Final      Medication List       This list is accurate as of: 03/23/13 11:59 PM.  Always use your most recent med list.  acetaminophen 650 MG CR tablet  Commonly known as:  TYLENOL  Take 650 mg by mouth daily as needed (joint pain).     apixaban 5 MG Tabs tablet  Commonly known as:  ELIQUIS  Take 1 tablet (5 mg total) by mouth 2 (two) times daily.     Calcium Carbonate-Vitamin D 600-200 MG-UNIT Tabs  Take by mouth 2 (two) times daily. Take 1 tablet daily for vitamin supplement.     furosemide 40 MG tablet  Commonly known as:  LASIX  20mg  daily, can take extra 20mg  prn     GLUCOSAMINE 1500 COMPLEX Caps  Take one tablet once a day     glucose blood test strip  Use as instructed     glucose monitoring kit monitoring kit  1 each by Does not apply route as needed for other. Take blood sugar once daily.     lisinopril 20 MG tablet  Commonly known as:  PRINIVIL,ZESTRIL  Take 1 tablet (20 mg total) by mouth daily. Take 1 tablet by mouth daily for blood pressure.     metFORMIN 500 MG tablet   Commonly known as:  GLUCOPHAGE  Take 500 mg by mouth daily with breakfast.     metoprolol tartrate 25 MG tablet  Commonly known as:  LOPRESSOR  Take 1 tablet (25 mg total) by mouth 2 (two) times daily.     multivitamin with minerals Tabs tablet  Take 1 tablet by mouth daily. Take 1 tablet daily for vitamin supplement.     omeprazole 20 MG capsule  Commonly known as:  PRILOSEC  Take 1 capsule (20 mg total) by mouth daily before breakfast. Take 1 tablet daily for reflux.     simvastatin 10 MG tablet  Commonly known as:  ZOCOR  Take 1 tablet (10 mg total) by mouth at bedtime.            Past Medical History  Diagnosis Date  . Hypertension   . Hypercholesteremia   . Stomach ulcer     years  . H/O hiatal hernia   . Varicose veins     both legs  . Arthritis     left knee  . Diabetes mellitus     diet controlled, pt does not check cbg  . Left rotator cuff tear Jul 12, 2012  . Edema   . Unspecified hereditary and idiopathic peripheral neuropathy   . Dizziness and giddiness   . Unspecified vitamin D deficiency   . Diaphragmatic hernia without mention of obstruction or gangrene     Past Surgical History  Procedure Laterality Date  . Esopagus strectching  2003 and 3 yrs ago  . Tonsillectomy  age 18  . Shoulder open rotator cuff repair  11/05/2011    Procedure: ROTATOR CUFF REPAIR SHOULDER OPEN;  Surgeon: Jacki Cones, MD;  Location: [facility];  Service: Orthopedics;  Laterality: Left;  Left Shoulder Rotator Cuff Repair Open with Graft and Anchors  . Reverse shoulder arthroplasty Right 09/17/2012    Procedure: REVERSE SHOULDER ARTHROPLASTY;  Surgeon: Verlee Rossetti, MD;  Location: [facility] OR;  Service: Orthopedics;  Laterality: Right;  . Orif ankle fracture Right 09/20/2012    Procedure: OPEN REDUCTION INTERNAL FIXATION (ORIF) ANKLE FRACTURE;  Surgeon: Javier Docker, MD;  Location: [facility];  Service: Orthopedics;  Laterality: Right;  . Mastectomy      bilateral  . Cardioversion  N/A 02/22/2013    Procedure: CARDIOVERSION;  Surgeon: Cassell Clement, MD;  Location: [facility] ENDOSCOPY;  Service: Cardiovascular;  Laterality: N/A;    Family History  Problem Relation Age of Onset  . Parkinson's disease Mother   . Hip fracture Mother   . Heart disease Mother   . Alzheimer's disease Mother   . Heart disease Father   . Parkinson's disease Sister   . Heart disease Brother     Social History:  reports that he quit smoking about 16 years ago. His smoking use included Pipe and Cigarettes. He smoked 0.00 packs per day for 50 years. He has never used smokeless tobacco. He reports that  drinks alcohol. He reports that he does not use illicit drugs.  Allergies:  Allergies  Allergen Reactions  . Morphine And Related Nausea And Vomiting  . Oxycodone Nausea And Vomiting    Review of Systems:  CARDIOLOGY: long  history of high blood pressure.       RESPIRATORY:   he has dyspnea on exertion.      GASTROENTEROLOGY:  no Change in bowel habits.      ENDOCRINOLOGY:  no history of Diabetes.   He has had generalized weakness History of neuropathy            Examination:   BP 122/72  Pulse 83  Resp 10  Ht 5' 5.25" (1.657 m)  Wt 148 lb (67.132 kg)  BMI 24.45 kg/m2  SpO2 99%   General Appearance:  well-built but somewhat asthenic looking, pleasant, pale looking, not anxious or hyperkinetic..        Eyes: No excessive prominence, lid lag or stare. No swelling of the eyelids Oral mucosa normal  Neck: The thyroid exam shows a 4-5 cm right-sided smooth firm nodule in the medial and lower part, best felt on swallowing. Left side not palpable  There is no lymphadenopathy .          Heart:  heart rate is irregular with a heart rate about 110-120; normal S1 and S2, no murmurs .          Lungs: breath sounds are clear bilaterally with occasional coarse crepitations right base  Extremities: hands are warm. No ankle edema. Neurological: REFLEXES: at biceps and ankles are difficult to  elicit.  he appears to have some wasting of his hand muscles  TREMORS:  not present   Assessments    Subclinical Hyperthyroidism   He has a low TSH but normal T4 and free T3 levels Quite likely he may have a warm or hot nodule in the right side and TSH is getting progressively lower At this time unlikely that the  hyperthyroidism is symptomatic and probably not affecting his heart rate or causing atrial fibrillation  Will need to do a thyroid nuclear scan to evaluate thyroid function and identify any hot nodules If he does have a hot nodule with a relatively high uptake will ablate with [id number] Otherwise he can followup in 3 months and continue monitoring thyroid levels   Explained the diagnosis and plan to the family and patient and he will be scheduled for a thyroid scan. He prefers not to make a followup appointment unless scan abnormal  Rabecca Birge 03/24/2013, 9:21 PM

## 2013-03-23 NOTE — Telephone Encounter (Signed)
OK 

## 2013-03-23 NOTE — Patient Instructions (Signed)
To be scheduled for Thyroid nuclear scan

## 2013-03-24 ENCOUNTER — Institutional Professional Consult (permissible substitution): Payer: Medicare Other | Admitting: Cardiology

## 2013-03-28 ENCOUNTER — Other Ambulatory Visit: Payer: Medicare Other

## 2013-03-31 ENCOUNTER — Encounter (HOSPITAL_COMMUNITY)
Admission: RE | Admit: 2013-03-31 | Discharge: 2013-03-31 | Disposition: A | Discharge status: Home / Self Care | Payer: Medicare Other | Source: Ambulatory Visit | Attending: Endocrinology | Admitting: Endocrinology

## 2013-03-31 DIAGNOSIS — E0590 Thyrotoxicosis, unspecified without thyrotoxic crisis or storm: Secondary | ICD-10-CM | POA: Insufficient documentation

## 2013-03-31 DIAGNOSIS — E042 Nontoxic multinodular goiter: Secondary | ICD-10-CM

## 2013-04-01 ENCOUNTER — Encounter (HOSPITAL_COMMUNITY)
Admission: RE | Admit: 2013-04-01 | Discharge: 2013-04-01 | Disposition: A | Discharge status: Home / Self Care | Payer: Medicare Other | Source: Ambulatory Visit | Attending: Endocrinology | Admitting: Endocrinology

## 2013-04-01 DIAGNOSIS — E0590 Thyrotoxicosis, unspecified without thyrotoxic crisis or storm: Secondary | ICD-10-CM | POA: Diagnosis not present

## 2013-04-14 ENCOUNTER — Ambulatory Visit (HOSPITAL_COMMUNITY): Payer: Medicare Other

## 2013-04-15 ENCOUNTER — Other Ambulatory Visit (HOSPITAL_COMMUNITY): Payer: Medicare Other

## 2013-04-25 ENCOUNTER — Ambulatory Visit (INDEPENDENT_AMBULATORY_CARE_PROVIDER_SITE_OTHER): Payer: Medicare Other | Admitting: Cardiology

## 2013-04-25 ENCOUNTER — Encounter: Payer: Self-pay | Admitting: Cardiology

## 2013-04-25 DIAGNOSIS — E875 Hyperkalemia: Secondary | ICD-10-CM

## 2013-04-25 LAB — BASIC METABOLIC PANEL
BUN: 34 mg/dL — ABNORMAL HIGH (ref 6–23)
Chloride: 104 mEq/L (ref 96–112)
GFR: 59.79 mL/min — ABNORMAL LOW (ref >60.00)
Potassium: 4.3 mEq/L (ref 3.5–5.1)
Sodium: 140 mEq/L (ref 135–145)

## 2013-04-25 NOTE — Progress Notes (Signed)
HPI The patient presents for evaluation of lower extremity edema.  The first time I saw him he was in atrial fibrillation. He was placed on anticoagulation and we did attempt cardioversion but he only maintaining sinus rhythm for 48 hours. He's had some subclinical thyroid issues and is seeing Dr. Lucianne Muss.  I reviewed these notes and he does not think that his thyroid issues are related to his atrial fibrillation. However, I do think this would make treatment with amiodarone more problematic. In addition I am not entirely sure the atrial fibrillation is contributing to symptoms. Unfortunately he continued to have edema with an 11 pound weight gain. He respects his salt but not severely. He thinks his legs up sometimes. Knee-high compression stockings simply relocated the swelling to his knees. The lower extremity edema is his biggest problem. He's not having new shortness of breath, PND or orthopnea. He does have some baseline dyspnea. He's not really noticing the fibrillation per se.  Allergies  Allergen Reactions  . Morphine And Related Nausea And Vomiting  . Oxycodone Nausea And Vomiting    Current Outpatient Prescriptions  Medication Sig Dispense Refill  . acetaminophen (TYLENOL) 650 MG CR tablet Take 650 mg by mouth daily as needed (joint pain).      Marland Kitchen apixaban (ELIQUIS) 5 MG TABS tablet Take 1 tablet (5 mg total) by mouth 2 (two) times daily.  60 tablet  6  . Calcium Carbonate-Vitamin D 600-200 MG-UNIT TABS Take by mouth 2 (two) times daily. Take 1 tablet daily for vitamin supplement.      . furosemide (LASIX) 40 MG tablet 20mg  daily, can take extra 20mg  prn      . Glucosamine-Chondroit-Vit C-Mn (GLUCOSAMINE 1500 COMPLEX) CAPS Take one tablet once a day  30 each  0  . glucose blood test strip Use as instructed  100 each  12  . glucose monitoring kit (FREESTYLE) monitoring kit 1 each by Does not apply route as needed for other. Take blood sugar once daily.  1 each  0  . lisinopril  (PRINIVIL,ZESTRIL) 20 MG tablet Take 1 tablet (20 mg total) by mouth daily. Take 1 tablet by mouth daily for blood pressure.  30 tablet  5  . metFORMIN (GLUCOPHAGE) 500 MG tablet Take 500 mg by mouth daily with breakfast.      . metoprolol tartrate (LOPRESSOR) 25 MG tablet Take 1 tablet (25 mg total) by mouth 2 (two) times daily.  60 tablet  11  . Multiple Vitamin (MULITIVITAMIN WITH MINERALS) TABS Take 1 tablet by mouth daily. Take 1 tablet daily for vitamin supplement.      Marland Kitchen omeprazole (PRILOSEC) 20 MG capsule Take 1 capsule (20 mg total) by mouth daily before breakfast. Take 1 tablet daily for reflux.  30 capsule  5  . simvastatin (ZOCOR) 10 MG tablet Take 1 tablet (10 mg total) by mouth at bedtime.  90 tablet  3   No current facility-administered medications for this visit.    Past Medical History  Diagnosis Date  . Hypertension   . Hypercholesteremia   . Stomach ulcer     years  . H/O hiatal hernia   . Varicose veins     both legs  . Arthritis     left knee  . Diabetes mellitus     diet controlled, pt does not check cbg  . Left rotator cuff tear Jul 12, 2012  . Edema   . Unspecified hereditary and idiopathic peripheral neuropathy   . Dizziness  and giddiness   . Unspecified vitamin D deficiency   . Diaphragmatic hernia without mention of obstruction or gangrene     Past Surgical History  Procedure Laterality Date  . Esopagus strectching  2003 and 3 yrs ago  . Tonsillectomy  age 40  . Shoulder open rotator cuff repair  11/05/2011    Procedure: ROTATOR CUFF REPAIR SHOULDER OPEN;  Surgeon: Jacki Cones, MD;  Location: [facility];  Service: Orthopedics;  Laterality: Left;  Left Shoulder Rotator Cuff Repair Open with Graft and Anchors  . Reverse shoulder arthroplasty Right 09/17/2012    Procedure: REVERSE SHOULDER ARTHROPLASTY;  Surgeon: Verlee Rossetti, MD;  Location: [facility] OR;  Service: Orthopedics;  Laterality: Right;  . Orif ankle fracture Right 09/20/2012    Procedure: OPEN  REDUCTION INTERNAL FIXATION (ORIF) ANKLE FRACTURE;  Surgeon: Javier Docker, MD;  Location: [facility];  Service: Orthopedics;  Laterality: Right;  . Mastectomy      bilateral  . Cardioversion N/A 02/22/2013    Procedure: CARDIOVERSION;  Surgeon: Cassell Clement, MD;  Location: [facility] ENDOSCOPY;  Service: Cardiovascular;  Laterality: N/A;    ROS:  Recent C. Difficile, urinary frequency secondary to Lasix. Otherwise as stated in the HPI and negative for all other systems.  PHYSICAL EXAM BP 122/60  Pulse 90  Ht 5\' 5"  (1.651 m)  Wt 159 lb (72.122 kg)  BMI 26.46 kg/m2  SpO2 96% GENERAL:  Well appearing, looks a Nexium and then stated age HEENT:  Pupils equal round and reactive, fundi not visualized, oral mucosa unremarkable NECK:  mildjugular venous distention at 90, waveform within normal limits, carotid upstroke brisk and symmetric, no bruits, no thyromegaly LYMPHATICS:  No cervical, inguinal adenopathy LUNGS:  Clear to auscultation bilaterally BACK:  No CVA tenderness CHEST:  Bilateral mastectomy scars HEART:  PMI not displaced or sustained,S1 and S2 within normal limits, no S3,  no clicks, no rubs, no murmurs, irregular ABD:  Flat, positive bowel sounds normal in frequency in pitch, no bruits, no rebound, no guarding, no midline pulsatile mass, no hepatomegaly, no splenomegaly EXT:  2 plus pulses throughout, moderate edema, no cyanosis no clubbing SKIN:  No rashes no nodules NEURO:  Cranial nerves II through XII grossly intact, motor grossly intact throughout PSYCH:  Cognitively intact, oriented to person place and time   ASSESSMENT AND PLAN   CHF:  He does have some RV dysfunction and elevated pulmonary pressures and progressive lower extremity edema though not left-sided failure.I offered him hospitalization but he would like to defer for now. We discussed conservative therapies again. I'm going to check a basic metabolic profile. Going to try to keep his legs elevated watch his salt  and fluid and wear thigh-high compression stockings. If this doesn't work I would admit him for therapy.  ATRIAL FIBRILLATION:   Mr. Jyles Sontag has a CHA2DS2 - VASc score of 4 with a risk of stroke of 4%  and a HAS - BLED score of 2 with a low risk of bleeding.  He therefore has a clear indication for anticoagulation. He is tolerating his blood thinners. At this point I'm not entirely clear that trying to maintain sinus rhythm with improved overall his situation. We had a long discussion about this and I will hold this therapy in reserve as a possibility.

## 2013-04-25 NOTE — Patient Instructions (Addendum)
The current medical regimen is effective;  continue present plan and medications.  Please have blood work today (BMP).  Please wear compression stockings daily.  You may remove them at bedtime.  Follow up with Lilian Coma in 1 month.

## 2013-05-19 ENCOUNTER — Other Ambulatory Visit: Payer: Self-pay

## 2013-05-23 ENCOUNTER — Other Ambulatory Visit: Payer: Medicare Other

## 2013-05-23 DIAGNOSIS — E785 Hyperlipidemia, unspecified: Secondary | ICD-10-CM

## 2013-05-23 DIAGNOSIS — I10 Essential (primary) hypertension: Secondary | ICD-10-CM | POA: Diagnosis not present

## 2013-05-23 DIAGNOSIS — E119 Type 2 diabetes mellitus without complications: Secondary | ICD-10-CM | POA: Diagnosis not present

## 2013-05-24 LAB — HEMOGLOBIN A1C
Est. average glucose Bld gHb Est-mCnc: 157 mg/dL
Hgb A1c MFr Bld: 7.1 % — ABNORMAL HIGH (ref 4.8–5.6)

## 2013-05-24 LAB — BASIC METABOLIC PANEL
BUN: 37 mg/dL — ABNORMAL HIGH (ref 10–36)
Calcium: 9.7 mg/dL (ref 8.6–10.2)
Creatinine, Ser: 1.28 mg/dL — ABNORMAL HIGH (ref 0.76–1.27)
GFR calc non Af Amer: 49 mL/min/{1.73_m2} — ABNORMAL LOW (ref >59)
Glucose: 178 mg/dL — ABNORMAL HIGH (ref 65–99)
Potassium: 5.4 mmol/L — ABNORMAL HIGH (ref 3.5–5.2)

## 2013-05-25 ENCOUNTER — Ambulatory Visit (INDEPENDENT_AMBULATORY_CARE_PROVIDER_SITE_OTHER): Payer: Medicare Other | Admitting: Internal Medicine

## 2013-05-25 ENCOUNTER — Encounter: Payer: Self-pay | Admitting: Internal Medicine

## 2013-05-25 VITALS — BP 132/76 | HR 78 | Temp 97.2°F | Wt 157.8 lb

## 2013-05-25 DIAGNOSIS — I10 Essential (primary) hypertension: Secondary | ICD-10-CM

## 2013-05-25 DIAGNOSIS — G2581 Restless legs syndrome: Secondary | ICD-10-CM | POA: Diagnosis not present

## 2013-05-25 DIAGNOSIS — I4891 Unspecified atrial fibrillation: Secondary | ICD-10-CM

## 2013-05-25 DIAGNOSIS — E0590 Thyrotoxicosis, unspecified without thyrotoxic crisis or storm: Secondary | ICD-10-CM | POA: Diagnosis not present

## 2013-05-25 DIAGNOSIS — Z23 Encounter for immunization: Secondary | ICD-10-CM

## 2013-05-25 DIAGNOSIS — R609 Edema, unspecified: Secondary | ICD-10-CM

## 2013-05-25 DIAGNOSIS — E119 Type 2 diabetes mellitus without complications: Secondary | ICD-10-CM

## 2013-05-25 MED ORDER — ROPINIROLE HCL 0.25 MG PO TABS: 0.2500 mg | ORAL_TABLET | Freq: Every day | ORAL | Status: DC

## 2013-05-25 NOTE — Progress Notes (Signed)
Patient ID: Logan Bean, male   DOB: 12/19/1920, 79 y.o.   MRN: [id number]  Chief Complaint  Patient presents with  . Medical Managment of Chronic Issues    3 month f/u  . Immunizations    flu shot to be given today   Allergies  Allergen Reactions  . Morphine And Related Nausea And Vomiting  . Oxycodone Nausea And Vomiting   HPI 79 y/o male patient with history of afib, dm type 2, edema, CKD stage 3 among others is here for follow up. He is seeing cardiology and endocrinology next week. Denies any palpitations. He complaints of cramps and feeling of something crawling on his legs and arms mostly at night  cbg average 120-140 fasting  Review of Systems  Constitutional: Negative for fever, chills, weight loss, malaise/fatigue and diaphoresis.  HENT: Negative for congestion, hearing loss and sore throat.   Eyes: Negative for blurred vision, double vision and discharge.  Respiratory: Negative for cough, sputum production and wheezing.  ocassional dyspnea on exertion but improved from before Cardiovascular: Negative for chest pain, palpitations, orthopnea. Positive for leg swelling.  Gastrointestinal: Negative for heartburn, nausea, vomiting, abdominal pain, diarrhea and constipation.  Genitourinary: Negative for dysuria, urgency, frequency and flank pain.  Musculoskeletal: Negative for back pain, falls, joint pain and myalgias.  Skin: Negative for itching and rash.  Neurological: Negative for dizziness, tingling, focal weakness and headaches.  Psychiatric/Behavioral: Negative for depression and memory loss. The patient is not nervous/anxious.   Past Medical History  Diagnosis Date  . Hypertension   . Hypercholesteremia   . Stomach ulcer     years  . H/O hiatal hernia   . Varicose veins     both legs  . Arthritis     left knee  . Diabetes mellitus     diet controlled, pt does not check cbg  . Left rotator cuff tear Jul 12, 2012  . Edema   . Unspecified hereditary and  idiopathic peripheral neuropathy   . Dizziness and giddiness   . Unspecified vitamin D deficiency   . Diaphragmatic hernia without mention of obstruction or gangrene    Past Surgical History  Procedure Laterality Date  . Esopagus strectching  2003 and 3 yrs ago  . Tonsillectomy  age 69  . Shoulder open rotator cuff repair  11/05/2011    Procedure: ROTATOR CUFF REPAIR SHOULDER OPEN;  Surgeon: Jacki Cones, MD;  Location: [facility];  Service: Orthopedics;  Laterality: Left;  Left Shoulder Rotator Cuff Repair Open with Graft and Anchors  . Reverse shoulder arthroplasty Right 09/17/2012    Procedure: REVERSE SHOULDER ARTHROPLASTY;  Surgeon: Verlee Rossetti, MD;  Location: [facility] OR;  Service: Orthopedics;  Laterality: Right;  . Orif ankle fracture Right 09/20/2012    Procedure: OPEN REDUCTION INTERNAL FIXATION (ORIF) ANKLE FRACTURE;  Surgeon: Javier Docker, MD;  Location: [facility];  Service: Orthopedics;  Laterality: Right;  . Mastectomy      bilateral  . Cardioversion N/A 02/22/2013    Procedure: CARDIOVERSION;  Surgeon: Cassell Clement, MD;  Location: [facility] ENDOSCOPY;  Service: Cardiovascular;  Laterality: N/A;   Current Outpatient Prescriptions on File Prior to Visit  Medication Sig Dispense Refill  . acetaminophen (TYLENOL) 650 MG CR tablet Take 650 mg by mouth daily as needed (joint pain).      Marland Kitchen apixaban (ELIQUIS) 5 MG TABS tablet Take 1 tablet (5 mg total) by mouth 2 (two) times daily.  60 tablet  6  . Calcium Carbonate-Vitamin D  600-200 MG-UNIT TABS Take by mouth 2 (two) times daily. Take 1 tablet daily for vitamin supplement.      . furosemide (LASIX) 40 MG tablet 20mg  daily, can take extra 20mg  prn      . Glucosamine-Chondroit-Vit C-Mn (GLUCOSAMINE 1500 COMPLEX) CAPS Take one tablet once a day  30 each  0  . glucose blood test strip Use as instructed  100 each  12  . glucose monitoring kit (FREESTYLE) monitoring kit 1 each by Does not apply route as needed for other. Take blood sugar once  daily.  1 each  0  . lisinopril (PRINIVIL,ZESTRIL) 20 MG tablet Take 1 tablet (20 mg total) by mouth daily. Take 1 tablet by mouth daily for blood pressure.  30 tablet  5  . metFORMIN (GLUCOPHAGE) 500 MG tablet Take 500 mg by mouth daily with breakfast.      . metoprolol tartrate (LOPRESSOR) 25 MG tablet Take 1 tablet (25 mg total) by mouth 2 (two) times daily.  60 tablet  11  . Multiple Vitamin (MULITIVITAMIN WITH MINERALS) TABS Take 1 tablet by mouth daily. Take 1 tablet daily for vitamin supplement.      Marland Kitchen omeprazole (PRILOSEC) 20 MG capsule Take 1 capsule (20 mg total) by mouth daily before breakfast. Take 1 tablet daily for reflux.  30 capsule  5  . simvastatin (ZOCOR) 10 MG tablet Take 1 tablet (10 mg total) by mouth at bedtime.  90 tablet  3   No current facility-administered medications on file prior to visit.     Physical exam- BP 132/76  Pulse 78  Temp(Src) 97.2 F (36.2 C) (Oral)  Wt 157 lb 12.8 oz (71.578 kg)  SpO2 95%  Constitutional: He is oriented to person, place, and time. Has gained weight Neck: Neck supple. No JVD present. No thyromegaly present.   Cardiovascular: controlled rate, irregular HR Respiratory: Effort normal and breath sounds normal. No respiratory distress. No crackles, rhonchi or wheeze GI: Soft. Bowel sounds are normal. He exhibits no distension. There is no tenderness.  Musculoskeletal: Normal range of motion. He exhibits edema 1+ in his legs upto below the knee. Neurological: He is alert and oriented to person, place, and time.   Skin: Skin is warm and dry. No rash Psychiatric: He has a normal mood and affect. His behavior is normal.   Labs- CBC    Component Value Date/Time   WBC 8.1 03/21/2013 1208   WBC 8.6 01/12/2013 1632   RBC 4.56 03/21/2013 1208   RBC 4.68 01/12/2013 1632   HGB 11.0* 03/21/2013 1208   HCT 33.9* 03/21/2013 1208   PLT 240.0 03/21/2013 1208   MCV 74.4* 03/21/2013 1208   MCH 23.9* 01/12/2013 1632   MCH 28.8 09/29/2012 0415   MCHC 32.4  03/21/2013 1208   MCHC 30.9* 01/12/2013 1632   RDW 20.5* 03/21/2013 1208   RDW 17.4* 01/12/2013 1632   LYMPHSABS 1.3 03/21/2013 1208   LYMPHSABS 1.2 01/12/2013 1632   MONOABS 0.7 03/21/2013 1208   EOSABS 0.3 03/21/2013 1208   EOSABS 0.6* 01/12/2013 1632   BASOSABS 0.0 03/21/2013 1208   BASOSABS 0.0 01/12/2013 1632    CMP     Component Value Date/Time   NA 140 05/23/2013 1037   NA 140 04/25/2013 1226   K 5.4* 05/23/2013 1037   CL 104 05/23/2013 1037   CO2 21 05/23/2013 1037   GLUCOSE 178* 05/23/2013 1037   GLUCOSE 175* 04/25/2013 1226   BUN 37* 05/23/2013 1037   BUN 34*  04/25/2013 1226   CREATININE 1.28* 05/23/2013 1037   CALCIUM 9.7 05/23/2013 1037   PROT 5.8* 01/12/2013 1632   PROT 5.2* 09/23/2012 0450   ALBUMIN 1.8* 09/23/2012 0450   AST 20 01/12/2013 1632   ALT 31 01/12/2013 1632   ALKPHOS 112 01/12/2013 1632   BILITOT 0.8 01/12/2013 1632   GFRNONAA 49* 05/23/2013 1037   GFRAA 57* 05/23/2013 1037   Lab Results  Component Value Date   HGBA1C 7.1* 05/23/2013   Lab Results  Component Value Date   TSH 0.077* 03/02/2013    Assessment/plan  1. Restless leg syndrome With crawling sensation in the night time, normal potassium level on labs and feeling of ? Cramps will treat him for RLS and reassess in 4 weeks - rOPINIRole (REQUIP) 0.25 MG tablet; Take 1 tablet (0.25 mg total) by mouth at bedtime.  Dispense: 60 tablet; Refill: 0  2. Atrial fibrillation Rate controlled. Continue coreg and eliquis  3. CHF (congestive heart failure) Symptoms under control. Has gained weight. Continue current regimen  4. HTN (hypertension) bp under control, continue coreg, lisinopril for now with lasix  5. Hyperthyroidism, subclinical To follow with Dr Lucianne Muss next in 2 days, will review notes  6. Type II or unspecified type diabetes mellitus without mention of complication, not stated as uncontrolled Reviewed a1c. Continue metformin 500 mg daily and monitor cbg. Continue lisinopril and statin  7. Edema Will  continue lasix 40 mg daily, pt taking this dose for now. Reviewed bmp. Monitor renal function  8. Need for prophylactic vaccination and inoculation against influenza Influenza vaccine provided

## 2013-05-26 ENCOUNTER — Ambulatory Visit (INDEPENDENT_AMBULATORY_CARE_PROVIDER_SITE_OTHER): Payer: Medicare Other | Admitting: Physician Assistant

## 2013-05-26 ENCOUNTER — Encounter: Payer: Self-pay | Admitting: Physician Assistant

## 2013-05-26 VITALS — BP 100/50 | HR 87 | Ht 65.0 in | Wt 158.0 lb

## 2013-05-26 DIAGNOSIS — I4891 Unspecified atrial fibrillation: Secondary | ICD-10-CM

## 2013-05-26 DIAGNOSIS — I5032 Chronic diastolic (congestive) heart failure: Secondary | ICD-10-CM | POA: Diagnosis not present

## 2013-05-26 DIAGNOSIS — M1710 Unilateral primary osteoarthritis, unspecified knee: Secondary | ICD-10-CM | POA: Diagnosis not present

## 2013-05-26 DIAGNOSIS — I10 Essential (primary) hypertension: Secondary | ICD-10-CM

## 2013-05-26 DIAGNOSIS — E0590 Thyrotoxicosis, unspecified without thyrotoxic crisis or storm: Secondary | ICD-10-CM

## 2013-05-26 NOTE — Progress Notes (Signed)
[address] [place], [place]  [address] Phone: [phone] Fax:  [phone]  Date:  05/26/2013   ID:  Logan Bean, Logan Bean 09/29/1920, MRN [id number]  PCP:  Logan Grout, MD  Cardiologist:  Dr. Rollene Bean     History of Present Illness: Logan Bean is a 79 y.o. male with a hx of HTN, HL, DM2, CKD, diastolic CHF, AFib. He developed LE edema/volume overload after a prolonged hospitalization in 09/2012 which required rehab after discharge. He had shoulder surgery, then fx his ankle. He underwent ORIF. Hospital stay was c/b C. Diff colitis. He established with Dr. Rollene Bean and was noted to be in atrial fibrillation. Echocardiogram 01/2013: EF 50-55%, normal wall motion, MAC, moderate MR, mild BAE, mild RVE, PASP 33, trivial effusion. Patient was placed on Eliquis for anticoagulation (CHADS2-VASc=5). Labs indicated subclinical hyperthyroidism. Thyroid nodule has been biopsied and this was negative for malignancy. His PCP is following.  For his AFib, he underwent DCCV 02/22/13 with restoration of NSR. He felt better after cardioversion. About 48 hours later, he felt he was back in atrial fibrillation.  A strategy of rate control was pursued.  He was seen recently in f/u with Dr. Rollene Bean and was felt to be volume overloaded again.  He is brought back for follow up today.    Patient continues to note significant LE edema. However, interestingly, his edema is typically resolved in the mornings.  He really denies significant dyspnea. He denies orthopnea, PND. He denies chest pain or syncope. Overall, he is NYHA class II-IIb. He typically takes 40 mg of Lasix a day. He will take an extra 20 mg as needed. He is quite miserable with frequent urination. Compression stockings have been difficult to wear. He has an open toe stocking that rolls up around his ankle and has caused some irritation.  Recent Labs: 01/12/2013: ALT 31; Pro B Natriuretic peptide (BNP) 2192*  03/02/2013: TSH  0.077*  03/21/2013: Hemoglobin 11.0*  05/23/2013: Creatinine 1.28*; Potassium 5.4*   Wt Readings from Last 3 Encounters:  05/26/13 158 lb (71.668 kg)  05/25/13 157 lb 12.8 oz (71.578 kg)  04/25/13 159 lb (72.122 kg)     Past Medical History  Diagnosis Date  . Hypertension   . Hypercholesteremia   . Stomach ulcer     years  . H/O hiatal hernia   . Varicose veins     both legs  . Arthritis     left knee  . Diabetes mellitus     diet controlled, pt does not check cbg  . Left rotator cuff tear Jul 12, 2012  . Edema   . Unspecified hereditary and idiopathic peripheral neuropathy   . Dizziness and giddiness   . Unspecified vitamin D deficiency   . Diaphragmatic hernia without mention of obstruction or gangrene     Current Outpatient Prescriptions  Medication Sig Dispense Refill  . acetaminophen (TYLENOL) 650 MG CR tablet Take 650 mg by mouth daily as needed (joint pain).      Marland Kitchen apixaban (ELIQUIS) 5 MG TABS tablet Take 1 tablet (5 mg total) by mouth 2 (two) times daily.  60 tablet  6  . Calcium Carbonate-Vitamin D 600-200 MG-UNIT TABS Take by mouth 2 (two) times daily. Take 1 tablet daily for vitamin supplement.      . furosemide (LASIX) 40 MG tablet 20mg  daily, can take extra 20mg  prn      . Glucosamine-Chondroit-Vit C-Mn (GLUCOSAMINE 1500 COMPLEX) CAPS Take one tablet once  a day  30 each  0  . glucose blood test strip Use as instructed  100 each  12  . glucose monitoring kit (FREESTYLE) monitoring kit 1 each by Does not apply route as needed for other. Take blood sugar once daily.  1 each  0  . lisinopril (PRINIVIL,ZESTRIL) 20 MG tablet Take 1 tablet (20 mg total) by mouth daily. Take 1 tablet by mouth daily for blood pressure.  30 tablet  5  . metFORMIN (GLUCOPHAGE) 500 MG tablet Take 500 mg by mouth daily with breakfast.      . metoprolol tartrate (LOPRESSOR) 25 MG tablet Take 1 tablet (25 mg total) by mouth 2 (two) times daily.  60 tablet  11  . Multiple Vitamin  (MULITIVITAMIN WITH MINERALS) TABS Take 1 tablet by mouth daily. Take 1 tablet daily for vitamin supplement.      . Omega-3 Fatty Acids (FISH OIL) 1000 MG CAPS Take 1,000 mg by mouth once.      Marland Kitchen omeprazole (PRILOSEC) 20 MG capsule Take 1 capsule (20 mg total) by mouth daily before breakfast. Take 1 tablet daily for reflux.  30 capsule  5  . simvastatin (ZOCOR) 10 MG tablet Take 1 tablet (10 mg total) by mouth at bedtime.  90 tablet  3  . rOPINIRole (REQUIP) 0.25 MG tablet Take 1 tablet (0.25 mg total) by mouth at bedtime.  60 tablet  0   No current facility-administered medications for this visit.    Allergies:   Morphine and related and Oxycodone   Social History:  The patient  reports that he quit smoking about 16 years ago. His smoking use included Pipe and Cigarettes. He smoked 0.00 packs per day for 50 years. He has never used smokeless tobacco. He reports that he drinks alcohol. He reports that he does not use illicit drugs.   Family History:  The patient's family history includes Alzheimer's disease in his mother; Heart disease in his brother, father, and mother; Hip fracture in his mother; Parkinson's disease in his mother and sister.   ROS:  Please see the history of present illness.   All other systems reviewed and negative.   PHYSICAL EXAM: VS:  BP 100/50  Pulse 87  Ht 5\' 5"  (1.651 m)  Wt 158 lb (71.668 kg)  BMI 26.29 kg/m2 Well nourished, well developed, in no acute distress HEENT: normal Neck: + JVD at 90 Cardiac:  normal S1, S2; irregularly irregular rhythm; no murmur Lungs:  clear to auscultation bilaterally, no wheezing, rhonchi or rales Abd: soft, nontender, no hepatomegaly Ext: 1-2+ bilateral LE edema up to the knees Skin: warm and dry Neuro:  CNs 2-12 intact, no focal abnormalities noted  EKG:  Atrial fibrillation, HR 87     ASSESSMENT AND PLAN:  1. Atrial Fibrillation: Heart rate is well controlled. He remains on anticoagulation with Eliquis.  Continue  current therapy. 2. Chronic Diastolic CHF:  He has more right-sided heart failure than left. We discussed the possibility of admission to the hospital again today. He is not interested in this. He seems to have some success with wearing compression stockings and keeping his legs elevated. I have reiterated this again to him today. I have given him a prescription for compression stockings with closed toe and a lower strength to hopefully help him with compliance. We discussed low-sodium diet. He will continue his current dose of Lasix. 3. Subclinical Hyperthyroidism: Follow up with endocrinology. 4. Hypertension: Controlled. 5. Hyperlipidemia: Continue statin. 6. Disposition: Follow up  with Dr. Antoine Poche or me in 6-8 weeks.  Signed, Tereso Newcomer, PA-C  05/26/2013 12:01 PM

## 2013-05-26 NOTE — Patient Instructions (Signed)
Your physician recommends that you keep your scheduled a follow-up appointment with Dr. Blades Lions on January 8th @ 11:15   Your physician recommends that you continue on your current medications as directed. Please refer to the Current Medication list given to you today.   Given a prescription today for compression hose  Keep legs elevated at all times   2 Gram Low Sodium Diet A 2 gram sodium diet restricts the amount of sodium in the diet to no more than 2 g or 2000 mg daily. Limiting the amount of sodium is often used to help lower blood pressure. It is important if you have heart, liver, or kidney problems. Many foods contain sodium for flavor and sometimes as a preservative. When the amount of sodium in a diet needs to be low, it is important to know what to look for when choosing foods and drinks. The following includes some information and guidelines to help make it easier for you to adapt to a low sodium diet. QUICK TIPS  Do not add salt to food.  Avoid convenience items and fast food.  Choose unsalted snack foods.  Buy lower sodium products, often labeled as "lower sodium" or "no salt added."  Check food labels to learn how much sodium is in 1 serving.  When eating at a restaurant, ask that your food be prepared with less salt or none, if possible. READING FOOD LABELS FOR SODIUM INFORMATION The nutrition facts label is a good place to find how much sodium is in foods. Look for products with no more than 500 to 600 mg of sodium per meal and no more than 150 mg per serving. Remember that 2 g = 2000 mg. The food label may also list foods as:  Sodium-free: Less than 5 mg in a serving.  Very low sodium: 35 mg or less in a serving.  Low-sodium: 140 mg or less in a serving.  Light in sodium: 50% less sodium in a serving. For example, if a food that usually has 300 mg of sodium is changed to become light in sodium, it will have 150 mg of sodium.  Reduced sodium: 25% less sodium in  a serving. For example, if a food that usually has 400 mg of sodium is changed to reduced sodium, it will have 300 mg of sodium. CHOOSING FOODS Grains  Avoid: Salted crackers and snack items. Some cereals, including instant hot cereals. Bread stuffing and biscuit mixes. Seasoned rice or pasta mixes.  Choose: Unsalted snack items. Low-sodium cereals, oats, puffed wheat and rice, shredded wheat. English muffins and bread. Pasta. Meats  Avoid: Salted, canned, smoked, spiced, pickled meats, including fish and poultry. Bacon, ham, sausage, cold cuts, hot dogs, anchovies.  Choose: Low-sodium canned tuna and salmon. Fresh or frozen meat, poultry, and fish. Dairy  Avoid: Processed cheese and spreads. Cottage cheese. Buttermilk and condensed milk. Regular cheese.  Choose: Milk. Low-sodium cottage cheese. Yogurt. Sour cream. Low-sodium cheese. Fruits and Vegetables  Avoid: Regular canned vegetables. Regular canned tomato sauce and paste. Frozen vegetables in sauces. Olives. Rosita Fire. Relishes. Sauerkraut.  Choose: Low-sodium canned vegetables. Low-sodium tomato sauce and paste. Frozen or fresh vegetables. Fresh and frozen fruit. Condiments  Avoid: Canned and packaged gravies. Worcestershire sauce. Tartar sauce. Barbecue sauce. Soy sauce. Steak sauce. Ketchup. Onion, garlic, and table salt. Meat flavorings and tenderizers.  Choose: Fresh and dried herbs and spices. Low-sodium varieties of mustard and ketchup. Lemon juice. Tabasco sauce. Horseradish. SAMPLE 2 GRAM SODIUM MEAL PLAN Breakfast / Sodium (mg)  1 cup low-fat milk / 143 mg  2 slices whole-wheat toast / 270 mg  1 tbs heart-healthy margarine / 153 mg  1 hard-boiled egg / 139 mg  1 small orange / 0 mg Lunch / Sodium (mg)  1 cup raw carrots / 76 mg   cup hummus / 298 mg  1 cup low-fat milk / 143 mg   cup red grapes / 2 mg  1 whole-wheat pita bread / 356 mg Dinner / Sodium (mg)  1 cup whole-wheat pasta / 2 mg  1 cup  low-sodium tomato sauce / 73 mg  3 oz lean ground beef / 57 mg  1 small side salad (1 cup raw spinach leaves,  cup cucumber,  cup yellow bell pepper) with 1 tsp olive oil and 1 tsp red wine vinegar / 25 mg Snack / Sodium (mg)  1 container low-fat vanilla yogurt / 107 mg  3 graham cracker squares / 127 mg Nutrient Analysis  Calories: 2033  Protein: 77 g  Carbohydrate: 282 g  Fat: 72 g  Sodium: 1971 mg Document Released: 06/30/2005 Document Revised: 09/22/2011 Document Reviewed: 10/01/2009 [employer] Patient Information 2014 [place], [place].

## 2013-05-27 ENCOUNTER — Ambulatory Visit (INDEPENDENT_AMBULATORY_CARE_PROVIDER_SITE_OTHER): Payer: Medicare Other | Admitting: Endocrinology

## 2013-05-27 ENCOUNTER — Encounter: Payer: Self-pay | Admitting: Endocrinology

## 2013-05-27 VITALS — BP 114/68 | HR 111 | Temp 98.5°F | Resp 14 | Ht 65.0 in | Wt 158.6 lb

## 2013-05-27 DIAGNOSIS — E0590 Thyrotoxicosis, unspecified without thyrotoxic crisis or storm: Secondary | ICD-10-CM | POA: Diagnosis not present

## 2013-05-27 DIAGNOSIS — E041 Nontoxic single thyroid nodule: Secondary | ICD-10-CM | POA: Diagnosis not present

## 2013-05-27 LAB — T4, FREE: Free T4: 1.25 ng/dL (ref 0.60–1.60)

## 2013-05-27 LAB — TSH: TSH: 0.19 u[IU]/mL — ABNORMAL LOW (ref 0.35–5.50)

## 2013-05-27 NOTE — Progress Notes (Signed)
Patient ID: Logan Bean, male   DOB: Apr 09, 1921, 79 y.o.   MRN: [id number]  Reason for Appointment:  Thyroid nodule, f/u visit   History of Present Illness:   He was seen in 9/14 with a diagnosis of subclinical hyperthyroidism associated with the thyroid nodule Since his free T4 and T3 were normal and his thyroid scan did not show a hot nodule is not recommended any treatment Also surprisingly his I-131 uptake was relatively low for unclear reasons, last IV contrast was given about 2-1/2 months prior  He is now complaining of Swelling in his legs which is not new, fingernail and skin changes, moist skin in the hands and also cramps. He has had swelling of his feet off and on He thinks this is related to his thyroid and is here for further evaluation  Previous history: He had an incidental diagnosis of thyroid enlargement with substernal extension when he had a chest CT scan to evaluate a lung nodule followed on chest x-ray in March Ultrasound showed a 5.2 cm solid right nodule with central necrosis and small left-sided nodules. Biopsy of this was benign. When he was having problems with his atrial arrhythmias his labs showed low TSH in July and subsequent TSH in August was lower at 0.077. He had no symptoms of shakiness, nervousness, feeling excessively warm and sweaty, and fatigue. Not losing any  weight this year and his appetite is fairly good He has had nonspecific complaints of shortness of breath and most of the time especially when he is showering. He has known atrial fibrillation for some time  The labs results are as follows:  Lab Results  Component Value Date   FREET4 1.32 01/21/2013   TSH 0.077* 03/02/2013   Lab Results  Component Value Date   TSH 0.077* 03/02/2013   TSH 0.24* 01/21/2013   TSH 0.431* 01/19/2013      Scan: The 24 hr radioactive iodine uptake is equal to 2.6%.  The thyroid scan shows diminished radiotracer activity within both lobes   Appointment on  05/23/2013  Component Date Value Range Status  . Glucose 05/23/2013 178* 65 - 99 mg/dL Final  . BUN 30/86/5784 37* 10 - 36 mg/dL Final  . Creatinine, Ser 05/23/2013 1.28* 0.76 - 1.27 mg/dL Final  . GFR calc non Af Amer 05/23/2013 49* >59 mL/min/1.73 Final  . GFR calc Af Amer 05/23/2013 57* >59 mL/min/1.73 Final  . BUN/Creatinine Ratio 05/23/2013 29* 10 - 22 Final  . Sodium 05/23/2013 140  134 - 144 mmol/L Final  . Potassium 05/23/2013 5.4* 3.5 - 5.2 mmol/L Final  . Chloride 05/23/2013 104  97 - 108 mmol/L Final  . CO2 05/23/2013 21  18 - 29 mmol/L Final  . Calcium 05/23/2013 9.7  8.6 - 10.2 mg/dL Final  . Hemoglobin O9G 05/23/2013 7.1* 4.8 - 5.6 % Final   Comment:          Increased risk for diabetes: 5.7 - 6.4                                   Diabetes: >6.4                                   Glycemic control for adults with diabetes: <7.0  . Estimated average glucose 05/23/2013 157   Final      Medication List  This list is accurate as of: 05/27/13  1:22 PM.  Always use your most recent med list.               acetaminophen 650 MG CR tablet  Commonly known as:  TYLENOL  Take 650 mg by mouth daily as needed (joint pain).     apixaban 5 MG Tabs tablet  Commonly known as:  ELIQUIS  Take 1 tablet (5 mg total) by mouth 2 (two) times daily.     Calcium Carbonate-Vitamin D 600-200 MG-UNIT Tabs  Take by mouth 2 (two) times daily. Take 1 tablet daily for vitamin supplement.     Fish Oil 1000 MG Caps  Take 1,000 mg by mouth once.     furosemide 40 MG tablet  Commonly known as:  LASIX  20mg  daily, can take extra 20mg  prn     GLUCOSAMINE 1500 COMPLEX Caps  Take one tablet once a day     glucose blood test strip  Use as instructed     glucose monitoring kit monitoring kit  1 each by Does not apply route as needed for other. Take blood sugar once daily.     lisinopril 20 MG tablet  Commonly known as:  PRINIVIL,ZESTRIL  Take 1 tablet (20 mg total) by mouth daily.  Take 1 tablet by mouth daily for blood pressure.     metFORMIN 500 MG tablet  Commonly known as:  GLUCOPHAGE  Take 500 mg by mouth daily with breakfast.     metoprolol tartrate 25 MG tablet  Commonly known as:  LOPRESSOR  Take 1 tablet (25 mg total) by mouth 2 (two) times daily.     multivitamin with minerals Tabs tablet  Take 1 tablet by mouth daily. Take 1 tablet daily for vitamin supplement.     omeprazole 20 MG capsule  Commonly known as:  PRILOSEC  Take 1 capsule (20 mg total) by mouth daily before breakfast. Take 1 tablet daily for reflux.     rOPINIRole 0.25 MG tablet  Commonly known as:  REQUIP  Take 1 tablet (0.25 mg total) by mouth at bedtime.     simvastatin 10 MG tablet  Commonly known as:  ZOCOR  Take 1 tablet (10 mg total) by mouth at bedtime.            Past Medical History  Diagnosis Date  . Hypertension   . Hypercholesteremia   . Stomach ulcer     years  . H/O hiatal hernia   . Varicose veins     both legs  . Arthritis     left knee  . Diabetes mellitus     diet controlled, pt does not check cbg  . Left rotator cuff tear Jul 12, 2012  . Edema   . Unspecified hereditary and idiopathic peripheral neuropathy   . Dizziness and giddiness   . Unspecified vitamin D deficiency   . Diaphragmatic hernia without mention of obstruction or gangrene     Past Surgical History  Procedure Laterality Date  . Esopagus strectching  2003 and 3 yrs ago  . Tonsillectomy  age 49  . Shoulder open rotator cuff repair  11/05/2011    Procedure: ROTATOR CUFF REPAIR SHOULDER OPEN;  Surgeon: Jacki Cones, MD;  Location: [facility];  Service: Orthopedics;  Laterality: Left;  Left Shoulder Rotator Cuff Repair Open with Graft and Anchors  . Reverse shoulder arthroplasty Right 09/17/2012    Procedure: REVERSE SHOULDER ARTHROPLASTY;  Surgeon: Verlee Rossetti, MD;  Location: [facility] OR;  Service: Orthopedics;  Laterality: Right;  . Orif ankle fracture Right 09/20/2012    Procedure: OPEN  REDUCTION INTERNAL FIXATION (ORIF) ANKLE FRACTURE;  Surgeon: Javier Docker, MD;  Location: [facility];  Service: Orthopedics;  Laterality: Right;  . Mastectomy      bilateral  . Cardioversion N/A 02/22/2013    Procedure: CARDIOVERSION;  Surgeon: Cassell Clement, MD;  Location: [facility];  Service: Cardiovascular;  Laterality: N/A;    Family History  Problem Relation Age of Onset  . Parkinson's disease Mother   . Hip fracture Mother   . Heart disease Mother   . Alzheimer's disease Mother   . Heart disease Father   . Parkinson's disease Sister   . Heart disease Brother     Social History:  reports that he quit smoking about 16 years ago. His smoking use included Pipe and Cigarettes. He smoked 0.00 packs per day for 50 years. He has never used smokeless tobacco. He reports that he drinks alcohol. He reports that he does not use illicit drugs.  Allergies:  Allergies  Allergen Reactions  . Morphine And Related Nausea And Vomiting  . Oxycodone Nausea And Vomiting    Review of Systems:  CARDIOLOGY: long  history of high blood pressure.       RESPIRATORY:   he has dyspnea on exertion.     .      He has had generalized weakness He is on metformin for diabetes History of neuropathy            Examination:   BP 114/68  Pulse 111  Temp(Src) 98.5 F (36.9 C)  Resp 14  Ht 5\' 5"  (1.651 m)  Wt 158 lb 9.6 oz (71.94 kg)  BMI 26.39 kg/m2  SpO2 98%   General Appearance:  well-built but somewhat asthenic looking, pleasant, pale looking, not anxious  Neck: The thyroid exam shows a 4-5 cm right-sided smooth firm nodule in the medial and lower part, best felt on swallowing. Left side not palpable Neurological: REFLEXES: at biceps are difficult to elicit.    TREMORS:  not present   Assessments    Subclinical Hyperthyroidism, from silent thyroiditis or warm nodule  He has rather nonspecific symptoms now unlikely to be related to his thyroid Discussed with the patient that unless his  hyperthyroidism is confirmed by lab today he does not need any treatment for his thyroid His thyroid nodule has been benign by biopsy and could not be evaluated by nuclear scan because of low iodine uptake   Hennie Gosa 05/27/2013, 1:22 PM    Addendum: Labs show improvement in thyroid levels, no further evaluation needed for this condition  Office Visit on 05/27/2013  Component Date Value Range Status  . Free T4 05/27/2013 1.25  0.60 - 1.60 ng/dL Final  . TSH 11/91/4782 0.19* 0.35 - 5.50 uIU/mL Final  . T3, Free 05/27/2013 2.3  2.3 - 4.2 pg/mL Final  Appointment on 05/23/2013  Component Date Value Range Status  . Glucose 05/23/2013 178* 65 - 99 mg/dL Final  . BUN 95/62/1308 37* 10 - 36 mg/dL Final  . Creatinine, Ser 05/23/2013 1.28* 0.76 - 1.27 mg/dL Final  . GFR calc non Af Amer 05/23/2013 49* >59 mL/min/1.73 Final  . GFR calc Af Amer 05/23/2013 57* >59 mL/min/1.73 Final  . BUN/Creatinine Ratio 05/23/2013 29* 10 - 22 Final  . Sodium 05/23/2013 140  134 - 144 mmol/L Final  . Potassium 05/23/2013 5.4* 3.5 - 5.2 mmol/L Final  .  Chloride 05/23/2013 104  97 - 108 mmol/L Final  . CO2 05/23/2013 21  18 - 29 mmol/L Final  . Calcium 05/23/2013 9.7  8.6 - 10.2 mg/dL Final  . Hemoglobin Z6X 05/23/2013 7.1* 4.8 - 5.6 % Final   Comment:          Increased risk for diabetes: 5.7 - 6.4                                   Diabetes: >6.4                                   Glycemic control for adults with diabetes: <7.0  . Estimated average glucose 05/23/2013 157   Final

## 2013-05-31 ENCOUNTER — Telehealth: Payer: Self-pay | Admitting: Endocrinology

## 2013-05-31 NOTE — Telephone Encounter (Signed)
Results given to patient

## 2013-06-28 ENCOUNTER — Encounter: Payer: Self-pay | Admitting: Internal Medicine

## 2013-06-28 ENCOUNTER — Ambulatory Visit (INDEPENDENT_AMBULATORY_CARE_PROVIDER_SITE_OTHER): Payer: Medicare Other | Admitting: Internal Medicine

## 2013-06-28 VITALS — BP 122/68 | HR 55 | Temp 97.1°F | Resp 12 | Wt 145.6 lb

## 2013-06-28 DIAGNOSIS — E0590 Thyrotoxicosis, unspecified without thyrotoxic crisis or storm: Secondary | ICD-10-CM

## 2013-06-28 DIAGNOSIS — I4891 Unspecified atrial fibrillation: Secondary | ICD-10-CM

## 2013-06-28 DIAGNOSIS — E119 Type 2 diabetes mellitus without complications: Secondary | ICD-10-CM

## 2013-06-28 DIAGNOSIS — I10 Essential (primary) hypertension: Secondary | ICD-10-CM | POA: Diagnosis not present

## 2013-06-28 DIAGNOSIS — I509 Heart failure, unspecified: Secondary | ICD-10-CM

## 2013-06-28 DIAGNOSIS — K219 Gastro-esophageal reflux disease without esophagitis: Secondary | ICD-10-CM | POA: Diagnosis not present

## 2013-06-28 MED ORDER — OMEPRAZOLE 10 MG PO CPDR: 10.0000 mg | DELAYED_RELEASE_CAPSULE | Freq: Every day | ORAL | Status: DC

## 2013-06-28 NOTE — Progress Notes (Signed)
Patient ID: Logan Bean, male   DOB: 06/14/1921, 79 y.o.   MRN: [id number]  Chief Complaint  Patient presents with  . Medical Managment of Chronic Issues    1 month follow-up: "feeling a lot better but still have a little ways to go"   Allergies  Allergen Reactions  . Morphine And Related Nausea And Vomiting  . Oxycodone Nausea And Vomiting    HPI 79 y/o male pt is here for follow up. His edema and breathing have improved. No further palpitations. Followed by endocrinology for his subclinical hyperthyroidism. No complaints this visit cbg average 100-140 fasting  Review of Systems  Constitutional: Negative for fever, chills, weight loss, malaise/fatigue and diaphoresis.  HENT: Negative for congestion, hearing loss and sore throat.   Eyes: Negative for blurred vision, double vision and discharge.  Respiratory: Negative for cough, sputum production and wheezing.  ocassional dyspnea on exertion but improved from before Cardiovascular: Negative for chest pain, palpitations, orthopnea. Positive for leg swelling.  Gastrointestinal: Negative for heartburn, nausea, vomiting, abdominal pain, diarrhea and constipation.  Genitourinary: Negative for dysuria, urgency, frequency and flank pain.  Musculoskeletal: Negative for back pain, falls, joint pain and myalgias.  Skin: Negative for itching and rash.  Neurological: Negative for dizziness, tingling, focal weakness and headaches.  Psychiatric/Behavioral: Negative for depression and memory loss. The patient is not nervous/anxious.   Past Medical History  Diagnosis Date  . Hypertension   . Hypercholesteremia   . Stomach ulcer     years  . H/O hiatal hernia   . Varicose veins     both legs  . Arthritis     left knee  . Diabetes mellitus     diet controlled, pt does not check cbg  . Left rotator cuff tear Jul 12, 2012  . Edema   . Unspecified hereditary and idiopathic peripheral neuropathy   . Dizziness and giddiness   . Unspecified  vitamin D deficiency   . Diaphragmatic hernia without mention of obstruction or gangrene    Past Surgical History  Procedure Laterality Date  . Esopagus strectching  2003 and 3 yrs ago  . Tonsillectomy  age 31  . Shoulder open rotator cuff repair  11/05/2011    Procedure: ROTATOR CUFF REPAIR SHOULDER OPEN;  Surgeon: Jacki Cones, MD;  Location: [facility];  Service: Orthopedics;  Laterality: Left;  Left Shoulder Rotator Cuff Repair Open with Graft and Anchors  . Reverse shoulder arthroplasty Right 09/17/2012    Procedure: REVERSE SHOULDER ARTHROPLASTY;  Surgeon: Verlee Rossetti, MD;  Location: [facility] OR;  Service: Orthopedics;  Laterality: Right;  . Orif ankle fracture Right 09/20/2012    Procedure: OPEN REDUCTION INTERNAL FIXATION (ORIF) ANKLE FRACTURE;  Surgeon: Javier Docker, MD;  Location: [facility];  Service: Orthopedics;  Laterality: Right;  . Mastectomy      bilateral  . Cardioversion N/A 02/22/2013    Procedure: CARDIOVERSION;  Surgeon: Cassell Clement, MD;  Location: [facility] ENDOSCOPY;  Service: Cardiovascular;  Laterality: N/A;   Medication reviewed. See [facility]  Physical exam BP 122/68  Pulse 55  Temp(Src) 97.1 F (36.2 C) (Oral)  Resp 12  Wt 145 lb 9.6 oz (66.044 kg)  SpO2 95%  Constitutional: He is oriented to person, place, and time. In no distress Neck: Neck supple. No JVD present. No thyromegaly present.   Cardiovascular: normal s1, s2, no murmur, irregular rate Respiratory: Effort normal and breath sounds normal. No respiratory distress. No crackles, rhonchi or wheeze GI: Soft. Bowel sounds are normal.  He exhibits no distension. There is no tenderness.  Musculoskeletal: Normal range of motion. He exhibits trace edema Neurological: He is alert and oriented to person, place, and time.   Skin: Skin is warm and dry. No rash Psychiatric: He has a normal mood and affect. His behavior is normal.    Lab Results  Component Value Date   HGBA1C 7.1* 05/23/2013   CBC    Component  Value Date/Time   WBC 8.1 03/21/2013 1208   WBC 8.6 01/12/2013 1632   RBC 4.56 03/21/2013 1208   RBC 4.68 01/12/2013 1632   HGB 11.0* 03/21/2013 1208   HCT 33.9* 03/21/2013 1208   PLT 240.0 03/21/2013 1208   MCV 74.4* 03/21/2013 1208   MCH 23.9* 01/12/2013 1632   MCH 28.8 09/29/2012 0415   MCHC 32.4 03/21/2013 1208   MCHC 30.9* 01/12/2013 1632   RDW 20.5* 03/21/2013 1208   RDW 17.4* 01/12/2013 1632   LYMPHSABS 1.3 03/21/2013 1208   LYMPHSABS 1.2 01/12/2013 1632   MONOABS 0.7 03/21/2013 1208   EOSABS 0.3 03/21/2013 1208   EOSABS 0.6* 01/12/2013 1632   BASOSABS 0.0 03/21/2013 1208   BASOSABS 0.0 01/12/2013 1632    CMP     Component Value Date/Time   NA 140 05/23/2013 1037   NA 140 04/25/2013 1226   K 5.4* 05/23/2013 1037   CL 104 05/23/2013 1037   CO2 21 05/23/2013 1037   GLUCOSE 178* 05/23/2013 1037   GLUCOSE 175* 04/25/2013 1226   BUN 37* 05/23/2013 1037   BUN 34* 04/25/2013 1226   CREATININE 1.28* 05/23/2013 1037   CALCIUM 9.7 05/23/2013 1037   PROT 5.8* 01/12/2013 1632   PROT 5.2* 09/23/2012 0450   ALBUMIN 1.8* 09/23/2012 0450   AST 20 01/12/2013 1632   ALT 31 01/12/2013 1632   ALKPHOS 112 01/12/2013 1632   BILITOT 0.8 01/12/2013 1632   GFRNONAA 49* 05/23/2013 1037   GFRAA 57* 05/23/2013 1037    Assessment/plan  Type II or unspecified type diabetes mellitus without mention of complication, not stated as uncontrolled check a1c. Continue metformin 500 mg daily and monitor cbg. Continue lisinopril and statin  Edema With minimal edema and weight stable, pt advised to take 20 mg lasix daily and additional 20 mg only when needed compared to his current 40 mg daily. Monitor bmp  Restless leg syndrome Tolerating requip well, continue this for now  GERD Continue prilosec  Atrial fibrillation Rate controlled. Continue coreg and eliquis  HTN (hypertension) bp under control, continue coreg, lisinopril for now with lasix  Subclinical hyperthyroidism Monitor thyroid panel

## 2013-07-13 ENCOUNTER — Other Ambulatory Visit: Payer: Self-pay | Admitting: Internal Medicine

## 2013-07-21 ENCOUNTER — Ambulatory Visit: Payer: Medicare Other | Admitting: Cardiology

## 2013-08-12 ENCOUNTER — Other Ambulatory Visit: Payer: Self-pay | Admitting: Internal Medicine

## 2013-08-23 ENCOUNTER — Ambulatory Visit: Payer: Medicare Other | Admitting: Cardiology

## 2013-08-24 DIAGNOSIS — IMO0002 Reserved for concepts with insufficient information to code with codable children: Secondary | ICD-10-CM | POA: Diagnosis not present

## 2013-08-24 DIAGNOSIS — M1710 Unilateral primary osteoarthritis, unspecified knee: Secondary | ICD-10-CM | POA: Diagnosis not present

## 2013-08-25 ENCOUNTER — Encounter: Payer: Self-pay | Admitting: Cardiology

## 2013-08-25 ENCOUNTER — Ambulatory Visit (INDEPENDENT_AMBULATORY_CARE_PROVIDER_SITE_OTHER): Payer: Medicare Other | Admitting: Cardiology

## 2013-08-25 VITALS — BP 122/72 | HR 76 | Ht 65.0 in | Wt 149.0 lb

## 2013-08-25 DIAGNOSIS — I4891 Unspecified atrial fibrillation: Secondary | ICD-10-CM

## 2013-08-25 DIAGNOSIS — I10 Essential (primary) hypertension: Secondary | ICD-10-CM

## 2013-08-25 DIAGNOSIS — Z79899 Other long term (current) drug therapy: Secondary | ICD-10-CM

## 2013-08-25 NOTE — Patient Instructions (Addendum)
The current medical regimen is effective;  continue present plan and medications.  Please have blood work today  (BMP)  Follow up in 6 months with Dr Percival Spanish.  You will receive a letter in the mail 2 months before you are due.  Please call us when you receive this letter to schedule your follow up appointment.

## 2013-08-25 NOTE — Progress Notes (Signed)
HPI The patient presents for evaluation of atrial fibrillation and volume overload.since I last saw him he has done much better. He has had much less volume overload. He is not feeling his atrial fibrillation. He doesn't have any palpitations, presyncope or syncope. He denies any chest pressure, neck or arm discomfort. He's had at least a 10 pound weight loss with his diuretics. He's feeling much better with this.  Allergies  Allergen Reactions  . Morphine And Related Nausea And Vomiting  . Oxycodone Nausea And Vomiting    Current Outpatient Prescriptions  Medication Sig Dispense Refill  . acetaminophen (TYLENOL) 650 MG CR tablet Take 650 mg by mouth daily as needed (joint pain).      Marland Kitchen apixaban (ELIQUIS) 5 MG TABS tablet Take 1 tablet (5 mg total) by mouth 2 (two) times daily.  60 tablet  6  . Calcium Carbonate-Vitamin D 600-200 MG-UNIT TABS Take by mouth 2 (two) times daily. Take 1 tablet daily for vitamin supplement.      . furosemide (LASIX) 40 MG tablet 72m daily, can take extra 24mprn      . Glucosamine-Chondroit-Vit C-Mn (GLUCOSAMINE 1500 COMPLEX) CAPS Take one tablet once a day  30 each  0  . glucose blood test strip Use as instructed  100 each  12  . glucose monitoring kit (FREESTYLE) monitoring kit 1 each by Does not apply route as needed for other. Take blood sugar once daily.  1 each  0  . lisinopril (PRINIVIL,ZESTRIL) 20 MG tablet TAKE 1 TABLET DAILY FOR BLOOD PRESSURE.  30 tablet  3  . metFORMIN (GLUCOPHAGE) 500 MG tablet Take 500 mg by mouth daily with breakfast.      . metoprolol tartrate (LOPRESSOR) 25 MG tablet Take 1 tablet (25 mg total) by mouth 2 (two) times daily.  60 tablet  11  . Multiple Vitamin (MULITIVITAMIN WITH MINERALS) TABS Take 1 tablet by mouth daily. Take 1 tablet daily for vitamin supplement.      . Omega-3 Fatty Acids (FISH OIL) 1000 MG CAPS Take 1,000 mg by mouth once.      . Marland KitchenOPINIRole (REQUIP) 0.25 MG tablet TAKE ONE TABLET AT BEDTIME.  60 tablet   3  . simvastatin (ZOCOR) 10 MG tablet Take 1 tablet (10 mg total) by mouth at bedtime.  90 tablet  3   No current facility-administered medications for this visit.    Past Medical History  Diagnosis Date  . Hypertension   . Hypercholesteremia   . Stomach ulcer     years  . H/O hiatal hernia   . Varicose veins     both legs  . Arthritis     left knee  . Diabetes mellitus     diet controlled, pt does not check cbg  . Left rotator cuff tear Jul 12, 2012  . Edema   . Unspecified hereditary and idiopathic peripheral neuropathy   . Dizziness and giddiness   . Unspecified vitamin D deficiency   . Diaphragmatic hernia without mention of obstruction or gangrene     Past Surgical History  Procedure Laterality Date  . Esopagus strectching  2003 and 3 yrs ago  . Tonsillectomy  age 80. Shoulder open rotator cuff repair  11/05/2011    Procedure: ROTATOR CUFF REPAIR SHOULDER OPEN;  Surgeon: RoTobi BastosMD;  Location: [facility];  Service: Orthopedics;  Laterality: Left;  Left Shoulder Rotator Cuff Repair Open with Graft and Anchors  . Reverse shoulder arthroplasty Right  09/17/2012    Procedure: REVERSE SHOULDER ARTHROPLASTY;  Surgeon: Augustin Schooling, MD;  Location: [place];  Service: Orthopedics;  Laterality: Right;  . Orif ankle fracture Right 09/20/2012    Procedure: OPEN REDUCTION INTERNAL FIXATION (ORIF) ANKLE FRACTURE;  Surgeon: Johnn Hai, MD;  Location: [facility];  Service: Orthopedics;  Laterality: Right;  . Mastectomy      bilateral  . Cardioversion N/A 02/22/2013    Procedure: CARDIOVERSION;  Surgeon: Darlin Coco, MD;  Location: [facility] ENDOSCOPY;  Service: Cardiovascular;  Laterality: N/A;    ROS:  Recent C. Difficile, urinary frequency secondary to Lasix. Otherwise as stated in the HPI and negative for all other systems.  PHYSICAL EXAM BP 122/72  Pulse 76  Ht 5' 5" (1.651 m)  Wt 149 lb (67.586 kg)  BMI 24.79 kg/m2 GENERAL:  Frail appearing HEENT:  Pupils equal round  and reactive, fundi not visualized, oral mucosa unremarkable NECK: No JVD LYMPHATICS:  No cervical, inguinal adenopathy LUNGS:  Clear to auscultation bilaterally BACK:  No CVA tenderness CHEST:  Bilateral mastectomy scars HEART:  PMI not displaced or sustained,S1 and S2 within normal limits, no S3,  no clicks, no rubs, no murmurs, irregular ABD:  Flat, positive bowel sounds normal in frequency in pitch, no bruits, no rebound, no guarding, no midline pulsatile mass, no hepatomegaly, no splenomegaly EXT:  2 plus pulses throughout, mild ankle edema, no cyanosis no clubbing SKIN:  No rashes no nodules NEURO:  Cranial nerves II through XII grossly intact, motor grossly intact throughout PSYCH:  Cognitively intact, oriented to person place and time  EKG:  Atrial fibrillation, rate 76, axis within normal limits, intervals within normal limits, no acute ST-T wave.  ASSESSMENT AND PLAN   CHF:  The patient is volume seems to be much better. He will remain on the meds as listed. I will check a basic metabolic profile.  ATRIAL FIBRILLATION:   Mr. Logan Bean has a CHA2DS2 - VASc score of 4 with a risk of stroke of 4%  and a HAS - BLED score of 2 with a low risk of bleeding. He is tolerating his blood thinners. He will continue with rate control and anticoagulation.

## 2013-08-26 LAB — BASIC METABOLIC PANEL
BUN: 37 mg/dL — ABNORMAL HIGH (ref 6–23)
CALCIUM: 10 mg/dL (ref 8.4–10.5)
CO2: 27 mEq/L (ref 19–32)
Chloride: 107 mEq/L (ref 96–112)
Creatinine, Ser: 1.1 mg/dL (ref 0.4–1.5)
GFR: 66 mL/min (ref >60.00)
GLUCOSE: 97 mg/dL (ref 70–99)
Potassium: 5.7 mEq/L — ABNORMAL HIGH (ref 3.5–5.1)
Sodium: 142 mEq/L (ref 135–145)

## 2013-09-16 ENCOUNTER — Other Ambulatory Visit: Payer: Self-pay | Admitting: *Deleted

## 2013-09-16 MED ORDER — APIXABAN 5 MG PO TABS: ORAL_TABLET | ORAL | Status: DC

## 2013-09-23 ENCOUNTER — Other Ambulatory Visit: Payer: Medicare Other

## 2013-09-27 ENCOUNTER — Ambulatory Visit: Payer: Medicare Other | Admitting: Internal Medicine

## 2013-10-04 ENCOUNTER — Other Ambulatory Visit: Payer: Medicare Other

## 2013-10-04 DIAGNOSIS — E0590 Thyrotoxicosis, unspecified without thyrotoxic crisis or storm: Secondary | ICD-10-CM | POA: Diagnosis not present

## 2013-10-04 DIAGNOSIS — E119 Type 2 diabetes mellitus without complications: Secondary | ICD-10-CM

## 2013-10-04 DIAGNOSIS — I10 Essential (primary) hypertension: Secondary | ICD-10-CM

## 2013-10-05 LAB — LIPID PANEL
Chol/HDL Ratio: 2.8 ratio units (ref 0.0–5.0)
Cholesterol, Total: 158 mg/dL (ref 100–199)
HDL: 56 mg/dL (ref >39)
LDL Calculated: 89 mg/dL (ref 0–99)
TRIGLYCERIDES: 65 mg/dL (ref 0–149)
VLDL Cholesterol Cal: 13 mg/dL (ref 5–40)

## 2013-10-05 LAB — COMPREHENSIVE METABOLIC PANEL
ALT: 22 IU/L (ref 0–44)
AST: 21 IU/L (ref 0–40)
Albumin/Globulin Ratio: 2 (ref 1.1–2.5)
Albumin: 4.1 g/dL (ref 3.2–4.6)
Alkaline Phosphatase: 102 IU/L (ref 39–117)
BUN/Creatinine Ratio: 28 — ABNORMAL HIGH (ref 10–22)
BUN: 36 mg/dL (ref 10–36)
CALCIUM: 9.5 mg/dL (ref 8.6–10.2)
CO2: 25 mmol/L (ref 18–29)
CREATININE: 1.29 mg/dL — AB (ref 0.76–1.27)
Chloride: 102 mmol/L (ref 97–108)
GFR calc Af Amer: 56 mL/min/{1.73_m2} — ABNORMAL LOW (ref >59)
GFR calc non Af Amer: 48 mL/min/{1.73_m2} — ABNORMAL LOW (ref >59)
GLOBULIN, TOTAL: 2.1 g/dL (ref 1.5–4.5)
Glucose: 125 mg/dL — ABNORMAL HIGH (ref 65–99)
Potassium: 4.9 mmol/L (ref 3.5–5.2)
Sodium: 143 mmol/L (ref 134–144)
Total Bilirubin: 0.6 mg/dL (ref 0.0–1.2)
Total Protein: 6.2 g/dL (ref 6.0–8.5)

## 2013-10-05 LAB — T3, FREE: T3 FREE: 2.8 pg/mL (ref 2.0–4.4)

## 2013-10-05 LAB — HEMOGLOBIN A1C
ESTIMATED AVERAGE GLUCOSE: 151 mg/dL
HEMOGLOBIN A1C: 6.9 % — AB (ref 4.8–5.6)

## 2013-10-05 LAB — T4: T4, Total: 7.4 ug/dL (ref 4.5–12.0)

## 2013-10-05 LAB — TSH: TSH: 0.515 u[IU]/mL (ref 0.450–4.500)

## 2013-10-11 ENCOUNTER — Encounter: Payer: Self-pay | Admitting: Internal Medicine

## 2013-10-11 ENCOUNTER — Ambulatory Visit (INDEPENDENT_AMBULATORY_CARE_PROVIDER_SITE_OTHER): Payer: Medicare Other | Admitting: Internal Medicine

## 2013-10-11 VITALS — BP 114/72 | HR 72 | Temp 96.4°F | Wt 150.0 lb

## 2013-10-11 DIAGNOSIS — I4891 Unspecified atrial fibrillation: Secondary | ICD-10-CM | POA: Diagnosis not present

## 2013-10-11 DIAGNOSIS — E119 Type 2 diabetes mellitus without complications: Secondary | ICD-10-CM | POA: Diagnosis not present

## 2013-10-11 DIAGNOSIS — I509 Heart failure, unspecified: Secondary | ICD-10-CM

## 2013-10-11 DIAGNOSIS — I10 Essential (primary) hypertension: Secondary | ICD-10-CM

## 2013-10-11 DIAGNOSIS — N183 Chronic kidney disease, stage 3 unspecified: Secondary | ICD-10-CM

## 2013-10-11 DIAGNOSIS — E785 Hyperlipidemia, unspecified: Secondary | ICD-10-CM

## 2013-10-11 DIAGNOSIS — E0590 Thyrotoxicosis, unspecified without thyrotoxic crisis or storm: Secondary | ICD-10-CM

## 2013-10-11 MED ORDER — METFORMIN HCL 500 MG PO TABS: ORAL_TABLET | ORAL | Status: DC

## 2013-10-11 MED ORDER — FUROSEMIDE 20 MG PO TABS: ORAL_TABLET | ORAL | Status: DC

## 2013-10-11 NOTE — Progress Notes (Signed)
Patient ID: Logan Bean, male   DOB: [birth year], 80 y.o.   MRN: [id number]    Chief Complaint  Patient presents with  . Medical Managment of Chronic Issues    3 month follow-up, discuss labs (copy given)   . Medication Management    Discuss changing dose of Furosemide, patient is currently taking 1/4 of a 40 mg (10 mg daily)    HPI 80 y/o male patient is here for follow up. His lab results were reviewed with him. His edema and breathing have improved. He has cut down on his lasix to 10 mg daily and his weight is stable. No dyspnea reported. No further palpitations. cbg average 90-110 fasting  Review of Systems   Constitutional: Negative for fever, chills, weight loss, malaise/fatigue and diaphoresis.   HENT: Negative for congestion, hearing loss and sore throat.    Eyes: Negative for blurred vision, double vision and discharge.   Respiratory: Negative for cough, sputum production and wheezing.  ocassional dyspnea on exertion Cardiovascular: Negative for chest pain, palpitations, orthopnea, leg swelling.   Gastrointestinal: Negative for heartburn, nausea, vomiting, abdominal pain, diarrhea and constipation.   Genitourinary: Negative for dysuria, urgency, frequency and flank pain.   Musculoskeletal: Negative for back pain, falls, joint pain and myalgias.   Skin: Negative for itching and rash.   Neurological: Negative for dizziness, tingling, focal weakness and headaches.   Psychiatric/Behavioral: Negative for depression and memory loss. The patient is not nervous/anxious.   Past Medical History  Diagnosis Date  . Hypertension   . Hypercholesteremia   . Stomach ulcer     years  . H/O hiatal hernia   . Varicose veins     both legs  . Arthritis     left knee  . Diabetes mellitus     diet controlled, pt does not check cbg  . Left rotator cuff tear Jul 12, 2012  . Edema   . Unspecified hereditary and idiopathic peripheral neuropathy   . Dizziness and giddiness   . Unspecified  vitamin D deficiency   . Diaphragmatic hernia without mention of obstruction or gangrene    Past Surgical History  Procedure Laterality Date  . Esopagus strectching  2003 and 3 yrs ago  . Tonsillectomy  age 60  . Shoulder open rotator cuff repair  11/05/2011    Procedure: ROTATOR CUFF REPAIR SHOULDER OPEN;  Surgeon: Tobi Bastos, MD;  Location: [facility];  Service: Orthopedics;  Laterality: Left;  Left Shoulder Rotator Cuff Repair Open with Graft and Anchors  . Reverse shoulder arthroplasty Right 09/17/2012    Procedure: REVERSE SHOULDER ARTHROPLASTY;  Surgeon: Augustin Schooling, MD;  Location: [place];  Service: Orthopedics;  Laterality: Right;  . Orif ankle fracture Right 09/20/2012    Procedure: OPEN REDUCTION INTERNAL FIXATION (ORIF) ANKLE FRACTURE;  Surgeon: Johnn Hai, MD;  Location: [facility];  Service: Orthopedics;  Laterality: Right;  . Mastectomy      bilateral  . Cardioversion N/A 02/22/2013    Procedure: CARDIOVERSION;  Surgeon: Darlin Coco, MD;  Location: [facility] ENDOSCOPY;  Service: Cardiovascular;  Laterality: N/A;   Current Outpatient Prescriptions on File Prior to Visit  Medication Sig Dispense Refill  . acetaminophen (TYLENOL) 650 MG CR tablet Take 650 mg by mouth daily as needed (joint pain).      Marland Kitchen apixaban (ELIQUIS) 5 MG TABS tablet Take one tablet twice daily  60 tablet  5  . Calcium Carbonate-Vitamin D 600-200 MG-UNIT TABS Take by mouth 2 (two) times daily.  Take 1 tablet daily for vitamin supplement.      . Glucosamine-Chondroit-Vit C-Mn (GLUCOSAMINE 1500 COMPLEX) CAPS Take one tablet once a day  30 each  0  . glucose blood test strip Use as instructed  100 each  12  . glucose monitoring kit (FREESTYLE) monitoring kit 1 each by Does not apply route as needed for other. Take blood sugar once daily.  1 each  0  . lisinopril (PRINIVIL,ZESTRIL) 20 MG tablet TAKE 1 TABLET DAILY FOR BLOOD PRESSURE.  30 tablet  3  . Multiple Vitamin (MULITIVITAMIN WITH MINERALS) TABS Take 1  tablet by mouth daily. Take 1 tablet daily for vitamin supplement.      . Omega-3 Fatty Acids (FISH OIL) 1000 MG CAPS Take 1,000 mg by mouth once.       No current facility-administered medications on file prior to visit.   Physical exam BP 114/72  Pulse 72  Temp(Src) 96.4 F (35.8 C) (Oral)  Wt 150 lb (68.04 kg)  Constitutional: He is oriented to person, place, and time. In no distress Neck: Neck supple. No JVD present. No thyromegaly present.   Cardiovascular: normal s1, s2, no murmur, irregular rate Respiratory: Effort normal and breath sounds normal. No respiratory distress. No crackles, rhonchi or wheeze GI: Soft. Bowel sounds are normal. He exhibits no distension. There is no tenderness.  Musculoskeletal: Normal range of motion. He exhibits trace edema Neurological: He is alert and oriented to person, place, and time.   Skin: Skin is warm and dry. No rash Psychiatric: He has a normal mood and affect. His behavior is normal.    Labs- Lab Results  Component Value Date   WBC 8.1 03/21/2013   HGB 11.0* 03/21/2013   HCT 33.9* 03/21/2013   MCV 74.4* 03/21/2013   PLT 240.0 03/21/2013   Lab Results  Component Value Date   HGBA1C 6.9* 10/04/2013   Lab Results  Component Value Date   TSH 0.515 10/04/2013   Lipid Panel     Component Value Date/Time   TRIG 65 10/04/2013 1026   HDL 56 10/04/2013 1026   CHOLHDL 2.8 10/04/2013 1026   LDLCALC 89 10/04/2013 1026    Assessment/plan  1. Type II or unspecified type diabetes mellitus without mention of complication, not stated as uncontrolled Reviewed a1c and cbg reading. Sugar has been better controlled. Will change metformin 500 mg to every other day and monitor cbg. Continue statin - Hemoglobin A1c; Future - Basic Metabolic Panel; Future  2. Hyperthyroidism, subclinical Monitor clinically  3. Atrial fibrillation Rate controlled. Continue lopressor and eliquis  4. HTN (hypertension) Well controlled. Continue lopressor and decrease  lasix to 10 mg daily. Monitor bmp - Basic Metabolic Panel; Future  5. CHF (congestive heart failure) Weight stable. Symptoms under control. Continue b blocker and diuretics. Decrease lasix to 10 mg daily and monitor bmp and weight  6. Dyslipidemia Controlled. Reviewed with patient. Continue zocor 10 mg daily  7. CKD (chronic kidney disease) stage 3, GFR 30-59 ml/min With his age, dm and HTN. Avoid NSAIDs. Monitor kidney function

## 2013-10-20 ENCOUNTER — Other Ambulatory Visit: Payer: Self-pay

## 2013-10-24 ENCOUNTER — Ambulatory Visit (INDEPENDENT_AMBULATORY_CARE_PROVIDER_SITE_OTHER): Payer: Medicare Other | Admitting: Podiatry

## 2013-10-24 ENCOUNTER — Encounter: Payer: Self-pay | Admitting: Podiatry

## 2013-10-24 VITALS — BP 130/77 | HR 87 | Resp 17 | Ht 65.0 in | Wt 145.0 lb

## 2013-10-24 DIAGNOSIS — M79609 Pain in unspecified limb: Secondary | ICD-10-CM

## 2013-10-24 DIAGNOSIS — B351 Tinea unguium: Secondary | ICD-10-CM

## 2013-10-24 NOTE — Patient Instructions (Signed)
Diabetes and Foot Care Diabetes may cause you to have problems because of poor blood supply (circulation) to your feet and legs. This may cause the skin on your feet to become thinner, break easier, and heal more slowly. Your skin may become dry, and the skin may peel and crack. You may also have nerve damage in your legs and feet causing decreased feeling in them. You may not notice minor injuries to your feet that could lead to infections or more serious problems. Taking care of your feet is one of the most important things you can do for yourself.  HOME CARE INSTRUCTIONS  Wear shoes at all times, even in the house. Do not go barefoot. Bare feet are easily injured.  Check your feet daily for blisters, cuts, and redness. If you cannot see the bottom of your feet, use a mirror or ask someone for help.  Wash your feet with warm water (do not use hot water) and mild soap. Then pat your feet and the areas between your toes until they are completely dry. Do not soak your feet as this can dry your skin.  Apply a moisturizing lotion or petroleum jelly (that does not contain alcohol and is unscented) to the skin on your feet and to dry, brittle toenails. Do not apply lotion between your toes.  Trim your toenails straight across. Do not dig under them or around the cuticle. File the edges of your nails with an emery board or nail file.  Do not cut corns or calluses or try to remove them with medicine.  Wear clean socks or stockings every day. Make sure they are not too tight. Do not wear knee-high stockings since they may decrease blood flow to your legs.  Wear shoes that fit properly and have enough cushioning. To break in new shoes, wear them for just a few hours a day. This prevents you from injuring your feet. Always look in your shoes before you put them on to be sure there are no objects inside.  Do not cross your legs. This may decrease the blood flow to your feet.  If you find a minor scrape,  cut, or break in the skin on your feet, keep it and the skin around it clean and dry. These areas may be cleansed with mild soap and water. Do not cleanse the area with peroxide, alcohol, or iodine.  When you remove an adhesive bandage, be sure not to damage the skin around it.  If you have a wound, look at it several times a day to make sure it is healing.  Do not use heating pads or hot water bottles. They may burn your skin. If you have lost feeling in your feet or legs, you may not know it is happening until it is too late.  Make sure your health care provider performs a complete foot exam at least annually or more often if you have foot problems. Report any cuts, sores, or bruises to your health care provider immediately. SEEK MEDICAL CARE IF:   You have an injury that is not healing.  You have cuts or breaks in the skin.  You have an ingrown nail.  You notice redness on your legs or feet.  You feel burning or tingling in your legs or feet.  You have pain or cramps in your legs and feet.  Your legs or feet are numb.  Your feet always feel cold. SEEK IMMEDIATE MEDICAL CARE IF:   There is increasing redness,   swelling, or pain in or around a wound.  There is a red line that goes up your leg.  Pus is coming from a wound.  You develop a fever or as directed by your health care provider.  You notice a bad smell coming from an ulcer or wound. Document Released: 06/27/2000 Document Revised: 03/02/2013 Document Reviewed: 12/07/2012 [employer] Patient Information 2014 [employer].  

## 2013-10-24 NOTE — Progress Notes (Signed)
   Subjective:    Patient ID: Logan Bean, male    DOB: 12/09/1920, 80 y.o.   MRN: [id number]  HPI Comments: N debridement L B/L 1 - 5 toenails D and O long-term C elongated, thick, crumbly toenails A diabetes, enclosed shoes, and right shoulder injury T home pedicure, Dr Dwaine Deter in [place] trimmed     Review of Systems  Cardiovascular: Positive for leg swelling.       Congestive Hart Failure  All other systems reviewed and are negative.      Objective:   Physical Exam Orientated x96 80 year old white male  Vascular: DP is are 2/4 bilaterally. PTs right 1/4 PT left 2/4  Neurological: Sensation to 10 g monofilament wire intact 4/5, bilaterally. Vibratory sensation nonreactive bilaterally. Ankle reflexes reactive bilaterally.  Dermatological: Brittle, elongated, hypertrophic toenails with palpable tenderness in all nail plates.  Musculoskeletal: Hammertoe deformities 2-5 bilaterally.       Assessment & Plan:   Assessment: Satisfactory neurovascular status Symptomatic onychomycoses x10  Plan: Nails x10 are debrided without any bleeding. Reappoint at three-month intervals. Information about diabetic footcare provided to patient in the after visit summary.

## 2013-10-25 ENCOUNTER — Encounter: Payer: Self-pay | Admitting: Podiatry

## 2013-11-02 DIAGNOSIS — IMO0002 Reserved for concepts with insufficient information to code with codable children: Secondary | ICD-10-CM | POA: Diagnosis not present

## 2013-11-02 DIAGNOSIS — M1710 Unilateral primary osteoarthritis, unspecified knee: Secondary | ICD-10-CM | POA: Diagnosis not present

## 2013-11-09 DIAGNOSIS — M1710 Unilateral primary osteoarthritis, unspecified knee: Secondary | ICD-10-CM | POA: Diagnosis not present

## 2013-11-09 DIAGNOSIS — IMO0002 Reserved for concepts with insufficient information to code with codable children: Secondary | ICD-10-CM | POA: Diagnosis not present

## 2013-11-16 DIAGNOSIS — M1710 Unilateral primary osteoarthritis, unspecified knee: Secondary | ICD-10-CM | POA: Diagnosis not present

## 2013-11-17 ENCOUNTER — Other Ambulatory Visit: Payer: Self-pay | Admitting: Internal Medicine

## 2014-01-09 ENCOUNTER — Other Ambulatory Visit: Payer: Medicare Other

## 2014-01-09 DIAGNOSIS — I10 Essential (primary) hypertension: Secondary | ICD-10-CM | POA: Diagnosis not present

## 2014-01-09 DIAGNOSIS — E119 Type 2 diabetes mellitus without complications: Secondary | ICD-10-CM

## 2014-01-10 LAB — BASIC METABOLIC PANEL
BUN / CREAT RATIO: 24 — AB (ref 10–22)
BUN: 27 mg/dL (ref 10–36)
CO2: 25 mmol/L (ref 18–29)
CREATININE: 1.11 mg/dL (ref 0.76–1.27)
Calcium: 9.4 mg/dL (ref 8.6–10.2)
Chloride: 103 mmol/L (ref 97–108)
GFR calc non Af Amer: 58 mL/min/{1.73_m2} — ABNORMAL LOW (ref >59)
GFR, EST AFRICAN AMERICAN: 67 mL/min/{1.73_m2} (ref >59)
Glucose: 116 mg/dL — ABNORMAL HIGH (ref 65–99)
Potassium: 4.8 mmol/L (ref 3.5–5.2)
SODIUM: 143 mmol/L (ref 134–144)

## 2014-01-10 LAB — HEMOGLOBIN A1C
Est. average glucose Bld gHb Est-mCnc: 157 mg/dL
HEMOGLOBIN A1C: 7.1 % — AB (ref 4.8–5.6)

## 2014-01-11 ENCOUNTER — Encounter: Payer: Self-pay | Admitting: Internal Medicine

## 2014-01-11 ENCOUNTER — Ambulatory Visit (INDEPENDENT_AMBULATORY_CARE_PROVIDER_SITE_OTHER): Payer: Medicare Other | Admitting: Internal Medicine

## 2014-01-11 VITALS — BP 128/70 | HR 82 | Temp 97.9°F | Resp 10 | Wt 153.0 lb

## 2014-01-11 DIAGNOSIS — I10 Essential (primary) hypertension: Secondary | ICD-10-CM | POA: Diagnosis not present

## 2014-01-11 DIAGNOSIS — I4891 Unspecified atrial fibrillation: Secondary | ICD-10-CM | POA: Diagnosis not present

## 2014-01-11 DIAGNOSIS — E785 Hyperlipidemia, unspecified: Secondary | ICD-10-CM

## 2014-01-11 DIAGNOSIS — K219 Gastro-esophageal reflux disease without esophagitis: Secondary | ICD-10-CM

## 2014-01-11 DIAGNOSIS — N183 Chronic kidney disease, stage 3 unspecified: Secondary | ICD-10-CM

## 2014-01-11 DIAGNOSIS — I509 Heart failure, unspecified: Secondary | ICD-10-CM

## 2014-01-11 DIAGNOSIS — G2581 Restless legs syndrome: Secondary | ICD-10-CM

## 2014-01-11 DIAGNOSIS — E119 Type 2 diabetes mellitus without complications: Secondary | ICD-10-CM

## 2014-01-11 DIAGNOSIS — E0590 Thyrotoxicosis, unspecified without thyrotoxic crisis or storm: Secondary | ICD-10-CM

## 2014-01-11 NOTE — Progress Notes (Signed)
Patient ID: Logan Bean, male   DOB: 1920-12-20, 80 y.o.   MRN: [id number]    Chief Complaint  Patient presents with  . Medical Management of Chronic Issues    3 month follow-up and discuss labs (copy printed)    Allergies  Allergen Reactions  . Morphine And Related Nausea And Vomiting  . Oxycodone Nausea And Vomiting    HPI 80 y/o male patient is here for follow up. He has history of afib, chf, HTN, GERD, dm among others. His lab results were reviewed with him. His edema has improved. Breathing is stable. No further palpitations. cbg average 126, between100- 130. Denies hypoglycemia. One reading of 150  Wt Readings from Last 3 Encounters:  01/11/14 153 lb (69.4 kg)  10/24/13 145 lb (65.772 kg)  10/11/13 150 lb (68.04 kg)   Review of Systems   Constitutional: Negative for fever, chills, malaise/fatigue and diaphoresis.   HENT: Negative for congestion, hearing loss and sore throat.    Eyes: Negative for blurred vision, double vision and discharge. No eye exam recently Respiratory: Negative for cough, sputum production and wheezing. ocassional dyspnea on exertion Cardiovascular: Negative for chest pain, palpitations, orthopnea. Has leg swelling.   Gastrointestinal: Negative for heartburn, nausea, vomiting, abdominal pain, diarrhea and constipation.   Genitourinary: Negative for dysuria, urgency, frequency and flank pain.   Musculoskeletal: Negative for back pain, falls, joint pain and myalgias. Uses a cane Skin: Negative for itching and rash.   Neurological: Negative for dizziness, tingling, focal weakness and headaches.   Psychiatric/Behavioral: Negative for depression and memory loss. The patient is not nervous/anxious.   Past Medical History  Diagnosis Date  . Hypertension   . Hypercholesteremia   . Stomach ulcer     years  . H/O hiatal hernia   . Varicose veins     both legs  . Arthritis     left knee  . Diabetes mellitus     diet controlled, pt does not check cbg    . Left rotator cuff tear Jul 12, 2012  . Edema   . Unspecified hereditary and idiopathic peripheral neuropathy   . Dizziness and giddiness   . Unspecified vitamin D deficiency   . Diaphragmatic hernia without mention of obstruction or gangrene    Current Outpatient Prescriptions on File Prior to Visit  Medication Sig Dispense Refill  . acetaminophen (TYLENOL) 650 MG CR tablet Take 650 mg by mouth daily as needed (joint pain).      Marland Kitchen apixaban (ELIQUIS) 5 MG TABS tablet Take one tablet twice daily  60 tablet  5  . Calcium Carbonate-Vitamin D 600-200 MG-UNIT TABS Take by mouth 2 (two) times daily. Take 1 tablet daily for vitamin supplement.      . furosemide (LASIX) 20 MG tablet Take half a tablet of 20 mg = 10 mg once a day for edema, Hypertension  30 tablet  3  . Glucosamine-Chondroit-Vit C-Mn (GLUCOSAMINE 1500 COMPLEX) CAPS Take one tablet once a day  30 each  0  . glucose blood test strip Use as instructed  100 each  12  . glucose monitoring kit (FREESTYLE) monitoring kit 1 each by Does not apply route as needed for other. Take blood sugar once daily.  1 each  0  . lisinopril (PRINIVIL,ZESTRIL) 20 MG tablet TAKE 1 TABLET DAILY FOR BLOOD PRESSURE.  30 tablet  1  . metFORMIN (GLUCOPHAGE) 500 MG tablet Take one tablet of 500 mg every other day for your diabetes  30 tablet  3  . metoprolol tartrate (LOPRESSOR) 25 MG tablet Take 25 mg by mouth 2 (two) times daily. For arrythmia      . Multiple Vitamin (MULITIVITAMIN WITH MINERALS) TABS Take 1 tablet by mouth daily. Take 1 tablet daily for vitamin supplement.      . Omega-3 Fatty Acids (FISH OIL) 1000 MG CAPS Take 1,000 mg by mouth once.      . simvastatin (ZOCOR) 10 MG tablet Take 10 mg by mouth at bedtime. For Cholesterol       No current facility-administered medications on file prior to visit.    Physical exam BP 128/70  Pulse 82  Temp(Src) 97.9 F (36.6 C) (Oral)  Resp 10  Wt 153 lb (69.4 kg)  SpO2 94%  Constitutional: He is  oriented to person, place, and time. In no distress Neck: Neck supple. No JVD present. No thyromegaly present.   Cardiovascular: normal s1, s2, no murmur, irregular rate Respiratory: Effort normal and breath sounds normal. No respiratory distress. No crackles, rhonchi or wheeze GI: Soft. Bowel sounds are normal. He exhibits no distension. There is no tenderness.  Musculoskeletal: Normal range of motion. He exhibits trace edema Neurological: He is alert and oriented to person, place, and time.  normal sensation to pinprick and vibration, normal dorsalis pedis, normal reflexes Skin: Skin is warm and dry. No rash Psychiatric: He has a normal mood and affect. His behavior is normal.   Labs-  Lab Results  Component Value Date   TSH 0.515 10/04/2013   Lab Results  Component Value Date   HGBA1C 7.1* 01/09/2014   Lab Results  Component Value Date   CREATININE 1.11 01/09/2014   Lab Results  Component Value Date   WBC 8.1 03/21/2013   HGB 11.0* 03/21/2013   HCT 33.9* 03/21/2013   MCV 74.4* 03/21/2013   PLT 240.0 03/21/2013   Assessment/plan  1. Chronic congestive heart failure, unspecified congestive heart failure type Stable.continue b blocker, ACEI and lasix - CMP; Future  2. Atrial fibrillation, unspecified Rate controlled. Continue lopressor and eliquis  3. Essential hypertension Stable. Continue lisinopril 20 mg daily and lasix 10 mg daily  4. Gastroesophageal reflux disease, esophagitis presence not specified Stable. Off all PPI  5. Hyperthyroidism, subclinical Monitor clinically - TSH; Future  6. Type II or unspecified type diabetes mellitus without mention of complication, not stated as uncontrolled Reviewed a1c and cbg reading. Sugar has been better controlled. a1c 7.1 but given his age, i will keep a goal of < 7.5. Continue metformin 500 mg every other day and monitor cbg. Continue statin. Normal foot exam - Hemoglobin A1c; Future - Microalbumin/Creatinine Ratio, Urine -  Ambulatory referral to Ophthalmology  7. CKD (chronic kidney disease) stage 3, GFR 30-59 ml/min With his age, dm and HTN. Avoid NSAIDs. Monitor kidney function - CBC with Differential; Future  8. Dyslipidemia Continue zocor 10 mg daily - Lipid Panel; Future

## 2014-01-12 ENCOUNTER — Other Ambulatory Visit: Payer: Medicare Other

## 2014-01-12 DIAGNOSIS — E119 Type 2 diabetes mellitus without complications: Secondary | ICD-10-CM | POA: Diagnosis not present

## 2014-01-13 LAB — MICROALBUMIN / CREATININE URINE RATIO
CREATININE UR: 40 mg/dL (ref 22.0–328.0)
MICROALB/CREAT RATIO: <7.5 mg/g creat (ref 0.0–30.0)

## 2014-01-17 ENCOUNTER — Other Ambulatory Visit: Payer: Self-pay | Admitting: Internal Medicine

## 2014-01-30 ENCOUNTER — Ambulatory Visit: Payer: Medicare Other | Admitting: Podiatry

## 2014-03-03 ENCOUNTER — Other Ambulatory Visit: Payer: Self-pay | Admitting: Internal Medicine

## 2014-03-15 DIAGNOSIS — M1710 Unilateral primary osteoarthritis, unspecified knee: Secondary | ICD-10-CM | POA: Diagnosis not present

## 2014-03-16 ENCOUNTER — Other Ambulatory Visit: Payer: Self-pay | Admitting: Physician Assistant

## 2014-03-21 DIAGNOSIS — E119 Type 2 diabetes mellitus without complications: Secondary | ICD-10-CM | POA: Diagnosis not present

## 2014-03-21 LAB — HM DIABETES EYE EXAM

## 2014-03-22 DIAGNOSIS — D3130 Benign neoplasm of unspecified choroid: Secondary | ICD-10-CM | POA: Diagnosis not present

## 2014-04-12 ENCOUNTER — Ambulatory Visit (INDEPENDENT_AMBULATORY_CARE_PROVIDER_SITE_OTHER): Payer: Medicare Other | Admitting: Internal Medicine

## 2014-04-12 ENCOUNTER — Encounter: Payer: Self-pay | Admitting: Internal Medicine

## 2014-04-12 VITALS — BP 138/70 | HR 65 | Temp 98.1°F | Resp 20 | Ht 65.0 in | Wt 151.0 lb

## 2014-04-12 DIAGNOSIS — R208 Other disturbances of skin sensation: Secondary | ICD-10-CM

## 2014-04-12 DIAGNOSIS — R209 Unspecified disturbances of skin sensation: Secondary | ICD-10-CM | POA: Diagnosis not present

## 2014-04-12 MED ORDER — ZOSTER VACCINE LIVE 19400 UNT/0.65ML ~~LOC~~ SOLR: 0.6500 mL | Freq: Once | SUBCUTANEOUS | Status: DC

## 2014-04-12 NOTE — Progress Notes (Signed)
Patient ID: Logan Bean, male   DOB: [birth year], 80 y.o.   MRN: [id number]    Chief Complaint  Patient presents with  . Acute Visit    skin concerns and sensory changes   Allergies  Allergen Reactions  . Morphine And Related Nausea And Vomiting  . Oxycodone Nausea And Vomiting   HPI 80 y/o male patient is here with concerns of his skin feeling slick and having wet sensation in his hand for sometime. He has had some sensory changes in tip of his fingers for a year now but feels this has progressed to involve his whole hand and skin in several parts of his body now.  He mentions that he can tell the difference between warm and cold but not wet and dry.  Denies sweating in his palms Denies excessive sweating Denies new joint stiffness Denies skin breakdown Denies difficulty swallowing Denies losing grips on objects he is holding  ROS See hpi  Past Medical History  Diagnosis Date  . Hypertension   . Hypercholesteremia   . Stomach ulcer     years  . H/O hiatal hernia   . Varicose veins     both legs  . Arthritis     left knee  . Diabetes mellitus     diet controlled, pt does not check cbg  . Left rotator cuff tear Jul 12, 2012  . Edema   . Unspecified hereditary and idiopathic peripheral neuropathy   . Dizziness and giddiness   . Unspecified vitamin D deficiency   . Diaphragmatic hernia without mention of obstruction or gangrene    Current Outpatient Prescriptions on File Prior to Visit  Medication Sig Dispense Refill  . acetaminophen (TYLENOL) 650 MG CR tablet Take 650 mg by mouth daily as needed (joint pain).      . Calcium Carbonate-Vitamin D 600-200 MG-UNIT TABS Take by mouth 2 (two) times daily. Take 1 tablet daily for vitamin supplement.      Marland Kitchen ELIQUIS 5 MG TABS tablet TAKE 1 TABLET TWICE DAILY.  60 tablet  2  . furosemide (LASIX) 20 MG tablet Take half a tablet of 20 mg = 10 mg once a day for edema, Hypertension  30 tablet  3  . Glucosamine-Chondroit-Vit C-Mn  (GLUCOSAMINE 1500 COMPLEX) CAPS Take one tablet once a day  30 each  0  . glucose blood test strip Use as instructed  100 each  12  . glucose monitoring kit (FREESTYLE) monitoring kit 1 each by Does not apply route as needed for other. Take blood sugar once daily.  1 each  0  . lisinopril (PRINIVIL,ZESTRIL) 20 MG tablet TAKE 1 TABLET DAILY FOR BLOOD PRESSURE.  30 tablet  3  . metFORMIN (GLUCOPHAGE) 500 MG tablet Take one tablet of 500 mg every other day for your diabetes  30 tablet  3  . metoprolol tartrate (LOPRESSOR) 25 MG tablet TAKE 1 TABLET TWICE DAILY.  60 tablet  0  . Multiple Vitamin (MULITIVITAMIN WITH MINERALS) TABS Take 1 tablet by mouth daily. Take 1 tablet daily for vitamin supplement.      . Omega-3 Fatty Acids (FISH OIL) 1000 MG CAPS Take 1,000 mg by mouth once.      . simvastatin (ZOCOR) 10 MG tablet TAKE ONE TABLET AT BEDTIME.  30 tablet  3   No current facility-administered medications on file prior to visit.    Physical exam  BP 138/70  Pulse 65  Temp(Src) 98.1 F (36.7 C) (Oral)  Resp 20  Ht _0  (1.651 m)  Wt 151 lb (68.493 kg)  BMI 25.13 kg/m2  SpO2 96%  Constitutional: He is oriented to person, place, and time. In no distress Neck: Neck supple. No JVD present. No thyromegaly present.   Cardiovascular: normal s1, s2, no murmur, irregular rate Respiratory: Effort normal and breath sounds normal. No respiratory distress. No crackles, rhonchi or wheeze GI: Soft. Bowel sounds are normal. He exhibits no distension. There is no tenderness.  Musculoskeletal: Normal range of motion. He exhibits trace edema Neurological: He is alert and oriented to person, place, and time.  normal sensation to pinprick and vibration, normal dorsalis pedis, normal reflexes Skin: Skin is warm and dry. No rash. Smooth skin in palm but otherwise normal skin elsewhere, thin skin Psychiatric: He has a normal mood and affect. His behavior is normal.   Labs CBC Latest Ref Rng 03/21/2013  01/12/2013 09/29/2012  WBC 4.5 - 10.5 K/uL 8.1 8.6 15.6(H)  Hemoglobin 13.0 - 17.0 g/dL 11.0(L) 11.2(L) 10.7(L)  Hematocrit 39.0 - 52.0 % 33.9(L) 36.2(L) 32.2(L)  Platelets 150.0 - 400.0 K/uL 240.0 - 546(H)   Lab Results  Component Value Date   TSH 0.515 10/04/2013   Assessment/plan  1. Decreased sensation of hand and arm His hx of DM and vascular disease could be contributing to this with peripheral neuropathy. Will get nerve conduction test to assess further. Normal tsh. Check b12 level. No signs of scleroderma - Nerve conduction test; Future - Vitamin B12

## 2014-04-13 LAB — VITAMIN B12: VITAMIN B 12: 954 pg/mL — AB (ref 211–946)

## 2014-04-17 ENCOUNTER — Ambulatory Visit (INDEPENDENT_AMBULATORY_CARE_PROVIDER_SITE_OTHER): Payer: Medicare Other | Admitting: Podiatry

## 2014-04-17 DIAGNOSIS — M79676 Pain in unspecified toe(s): Secondary | ICD-10-CM | POA: Diagnosis not present

## 2014-04-17 DIAGNOSIS — B351 Tinea unguium: Secondary | ICD-10-CM

## 2014-04-17 NOTE — Progress Notes (Signed)
Patient ID: Logan Bean, male   DOB: [birth year], 80 y.o.   MRN: [id number]  Subjective: This patient presents today complaining of painful toenails  Objective: Brittle, elongated, hypertrophic, discolored toenails 6-10  Assessment: Symptomatic onychomycoses x10  Plan: Debrided toenails x10 without a bleeding  Reappoint x3 months

## 2014-04-18 ENCOUNTER — Other Ambulatory Visit: Payer: Self-pay | Admitting: Cardiology

## 2014-05-01 ENCOUNTER — Other Ambulatory Visit: Payer: Self-pay | Admitting: *Deleted

## 2014-05-01 DIAGNOSIS — E119 Type 2 diabetes mellitus without complications: Secondary | ICD-10-CM

## 2014-05-01 MED ORDER — TRUEPLUS LANCETS 30G MISC: Status: DC

## 2014-05-01 MED ORDER — GLUCOSE BLOOD VI STRP: ORAL_STRIP | Status: DC

## 2014-05-01 NOTE — Telephone Encounter (Signed)
[facility]  

## 2014-05-03 DIAGNOSIS — G629 Polyneuropathy, unspecified: Secondary | ICD-10-CM | POA: Diagnosis not present

## 2014-05-03 DIAGNOSIS — G5600 Carpal tunnel syndrome, unspecified upper limb: Secondary | ICD-10-CM | POA: Diagnosis not present

## 2014-05-10 ENCOUNTER — Encounter: Payer: Self-pay | Admitting: Internal Medicine

## 2014-05-10 DIAGNOSIS — G5600 Carpal tunnel syndrome, unspecified upper limb: Secondary | ICD-10-CM | POA: Insufficient documentation

## 2014-05-11 ENCOUNTER — Other Ambulatory Visit: Payer: Medicare Other

## 2014-05-11 DIAGNOSIS — I509 Heart failure, unspecified: Secondary | ICD-10-CM

## 2014-05-11 DIAGNOSIS — N183 Chronic kidney disease, stage 3 unspecified: Secondary | ICD-10-CM

## 2014-05-11 DIAGNOSIS — E785 Hyperlipidemia, unspecified: Secondary | ICD-10-CM | POA: Diagnosis not present

## 2014-05-11 DIAGNOSIS — E119 Type 2 diabetes mellitus without complications: Secondary | ICD-10-CM | POA: Diagnosis not present

## 2014-05-11 DIAGNOSIS — E0590 Thyrotoxicosis, unspecified without thyrotoxic crisis or storm: Secondary | ICD-10-CM

## 2014-05-12 LAB — COMPREHENSIVE METABOLIC PANEL
ALBUMIN: 4.1 g/dL (ref 3.2–4.6)
ALT: 16 IU/L (ref 0–44)
AST: 18 IU/L (ref 0–40)
Albumin/Globulin Ratio: 2.1 (ref 1.1–2.5)
Alkaline Phosphatase: 99 IU/L (ref 39–117)
BUN/Creatinine Ratio: 31 — ABNORMAL HIGH (ref 10–22)
BUN: 35 mg/dL (ref 10–36)
CALCIUM: 9.9 mg/dL (ref 8.6–10.2)
CO2: 27 mmol/L (ref 18–29)
CREATININE: 1.13 mg/dL (ref 0.76–1.27)
Chloride: 103 mmol/L (ref 97–108)
GFR calc Af Amer: 65 mL/min/{1.73_m2} (ref >59)
GFR, EST NON AFRICAN AMERICAN: 57 mL/min/{1.73_m2} — AB (ref >59)
Globulin, Total: 2 g/dL (ref 1.5–4.5)
Glucose: 121 mg/dL — ABNORMAL HIGH (ref 65–99)
Potassium: 4.7 mmol/L (ref 3.5–5.2)
SODIUM: 145 mmol/L — AB (ref 134–144)
Total Bilirubin: 0.7 mg/dL (ref 0.0–1.2)
Total Protein: 6.1 g/dL (ref 6.0–8.5)

## 2014-05-12 LAB — CBC WITH DIFFERENTIAL/PLATELET
BASOS ABS: 0 10*3/uL (ref 0.0–0.2)
Basos: 0 %
EOS: 2 %
Eosinophils Absolute: 0.1 10*3/uL (ref 0.0–0.4)
HEMATOCRIT: 39.9 % (ref 37.5–51.0)
Hemoglobin: 12.7 g/dL (ref 12.6–17.7)
IMMATURE GRANS (ABS): 0 10*3/uL (ref 0.0–0.1)
IMMATURE GRANULOCYTES: 0 %
LYMPHS: 18 %
Lymphocytes Absolute: 1.3 10*3/uL (ref 0.7–3.1)
MCH: 27 pg (ref 26.6–33.0)
MCHC: 31.8 g/dL (ref 31.5–35.7)
MCV: 85 fL (ref 79–97)
MONOCYTES: 9 %
Monocytes Absolute: 0.6 10*3/uL (ref 0.1–0.9)
NEUTROS PCT: 71 %
Neutrophils Absolute: 5 10*3/uL (ref 1.4–7.0)
RBC: 4.71 x10E6/uL (ref 4.14–5.80)
RDW: 16 % — AB (ref 12.3–15.4)
WBC: 7 10*3/uL (ref 3.4–10.8)

## 2014-05-12 LAB — HEMOGLOBIN A1C
Est. average glucose Bld gHb Est-mCnc: 157 mg/dL
HEMOGLOBIN A1C: 7.1 % — AB (ref 4.8–5.6)

## 2014-05-12 LAB — LIPID PANEL
Chol/HDL Ratio: 3.7 ratio units (ref 0.0–5.0)
Cholesterol, Total: 157 mg/dL (ref 100–199)
HDL: 43 mg/dL (ref >39)
LDL CALC: 90 mg/dL (ref 0–99)
Triglycerides: 120 mg/dL (ref 0–149)
VLDL CHOLESTEROL CAL: 24 mg/dL (ref 5–40)

## 2014-05-12 LAB — TSH: TSH: 0.289 u[IU]/mL — ABNORMAL LOW (ref 0.450–4.500)

## 2014-05-16 ENCOUNTER — Ambulatory Visit (INDEPENDENT_AMBULATORY_CARE_PROVIDER_SITE_OTHER): Payer: Medicare Other | Admitting: Internal Medicine

## 2014-05-16 ENCOUNTER — Encounter: Payer: Self-pay | Admitting: Internal Medicine

## 2014-05-16 ENCOUNTER — Ambulatory Visit: Payer: Medicare Other | Admitting: *Deleted

## 2014-05-16 VITALS — BP 130/70 | HR 92 | Temp 97.9°F | Ht 65.0 in | Wt 155.4 lb

## 2014-05-16 DIAGNOSIS — E119 Type 2 diabetes mellitus without complications: Secondary | ICD-10-CM

## 2014-05-16 DIAGNOSIS — I10 Essential (primary) hypertension: Secondary | ICD-10-CM | POA: Diagnosis not present

## 2014-05-16 DIAGNOSIS — E042 Nontoxic multinodular goiter: Secondary | ICD-10-CM | POA: Diagnosis not present

## 2014-05-16 DIAGNOSIS — I872 Venous insufficiency (chronic) (peripheral): Secondary | ICD-10-CM | POA: Insufficient documentation

## 2014-05-16 DIAGNOSIS — Z23 Encounter for immunization: Secondary | ICD-10-CM

## 2014-05-16 DIAGNOSIS — G5601 Carpal tunnel syndrome, right upper limb: Secondary | ICD-10-CM

## 2014-05-16 DIAGNOSIS — G5603 Carpal tunnel syndrome, bilateral upper limbs: Secondary | ICD-10-CM

## 2014-05-16 DIAGNOSIS — E0590 Thyrotoxicosis, unspecified without thyrotoxic crisis or storm: Secondary | ICD-10-CM | POA: Diagnosis not present

## 2014-05-16 DIAGNOSIS — G5602 Carpal tunnel syndrome, left upper limb: Secondary | ICD-10-CM

## 2014-05-16 DIAGNOSIS — I8311 Varicose veins of right lower extremity with inflammation: Secondary | ICD-10-CM

## 2014-05-16 DIAGNOSIS — I8312 Varicose veins of left lower extremity with inflammation: Secondary | ICD-10-CM | POA: Diagnosis not present

## 2014-05-16 NOTE — Patient Instructions (Signed)
Get wrist splints at medical supply. Consider using Vitamin B6 (pyridoxine).

## 2014-05-16 NOTE — Progress Notes (Signed)
Patient ID: Logan Bean, male   DOB: [birth year], 80 y.o.   MRN: [id number]    Facility  [facility]    Place of Service:   OFFICE   Allergies  Allergen Reactions  . Morphine And Related Nausea And Vomiting  . Oxycodone Nausea And Vomiting    Chief Complaint  Patient presents with  . Annual Exam    Yearly physical, Discuss labs (copy printed)    HPI:  Visit was scheduled as a CPX, but he would prefer to reschedule with Dr. Bubba Camp. He does have questions about several things, though,  His hands continue to feel slick. He recently had PNCV that confirmed bilateral carpal tunnel syndrome.  He has a chronic ras on his legs. He has varicosities and the rash is a chronic venous stasis dermatitis. It has been labeled as eczema in the past.  He believes there is a problem with his thyroid again. Has had radiation therapy in the past for throiditis. TSH has fallen to a range that is consistent with hyperthyooidism. Weight is stable and he denies palpitations.  Medications: Patient's Medications  New Prescriptions   No medications on file  Previous Medications   ACETAMINOPHEN (TYLENOL) 650 MG CR TABLET    Take 650 mg by mouth daily as needed (joint pain).   CALCIUM CARBONATE-VITAMIN D 600-200 MG-UNIT TABS    Take by mouth 2 (two) times daily. Take 1 tablet daily for vitamin supplement.   ELIQUIS 5 MG TABS TABLET    TAKE 1 TABLET TWICE DAILY.   FUROSEMIDE (LASIX) 20 MG TABLET    Take half a tablet of 20 mg = 10 mg once a day for edema, Hypertension   GLUCOSAMINE-CHONDROIT-VIT C-MN (GLUCOSAMINE 1500 COMPLEX) CAPS    Take one tablet once a day   GLUCOSE BLOOD TEST STRIP    True Test Glucose Test Strips Check blood sugar once daily Dx: E11.9   GLUCOSE MONITORING KIT (FREESTYLE) MONITORING KIT    1 each by Does not apply route as needed for other. Take blood sugar once daily.   LISINOPRIL (PRINIVIL,ZESTRIL) 20 MG TABLET    TAKE 1 TABLET DAILY FOR BLOOD PRESSURE.   METFORMIN (GLUCOPHAGE) 500 MG  TABLET    Take one tablet of 500 mg every other day for your diabetes   METOPROLOL TARTRATE (LOPRESSOR) 25 MG TABLET    TAKE 1 TABLET TWICE DAILY.   MULTIPLE VITAMIN (MULITIVITAMIN WITH MINERALS) TABS    Take 1 tablet by mouth daily. Take 1 tablet daily for vitamin supplement.   OMEGA-3 FATTY ACIDS (FISH OIL) 1000 MG CAPS    Take 1,000 mg by mouth once.   SIMVASTATIN (ZOCOR) 10 MG TABLET    TAKE ONE TABLET AT BEDTIME.   TRUEPLUS LANCETS 30G MISC    Use to check blood sugar once daily. Dx E11.9   ZOSTER VACCINE LIVE, PF, (ZOSTAVAX) 97673 UNT/0.65ML INJECTION    Inject 19,400 Units into the skin once.  Modified Medications   No medications on file  Discontinued Medications   No medications on file     Review of Systems  Constitutional: Positive for fatigue. Negative for chills, diaphoresis, activity change and appetite change.  HENT: Positive for hearing loss. Negative for sore throat, trouble swallowing and voice change.   Eyes: Negative.   Respiratory: Negative.   Cardiovascular: Negative.   Gastrointestinal: Negative.   Endocrine: Negative for cold intolerance, heat intolerance, polydipsia, polyphagia and polyuria.  Genitourinary: Negative for urgency, decreased urine volume and difficulty urinating.  Musculoskeletal: Positive for back pain, arthralgias and gait problem.  Skin:       Bilateral stasis dermatitis.  Allergic/Immunologic: Negative.   Neurological: Positive for numbness. Negative for dizziness, tremors, seizures, syncope, speech difficulty, weakness, light-headedness and headaches.  Hematological: Negative.  Negative for adenopathy. Does not bruise/bleed easily.  Psychiatric/Behavioral: Negative.  Negative for sleep disturbance. The patient is not nervous/anxious.     Filed Vitals:   05/16/14 1354  BP: 130/70  Pulse: 92  Temp: 97.9 F (36.6 C)  TempSrc: Oral  Height: '5\' 5"'  (1.651 m)  Weight: 155 lb 6.4 oz (70.489 kg)  SpO2: 97%   Body mass index is 25.86  kg/(m^2).  Physical Exam  Constitutional: No distress.  Frail  HENT:  Right Ear: External ear normal.  Left Ear: External ear normal.  Nose: Nose normal.  Neck: No JVD present. No tracheal deviation present. Thyromegaly present.  Cardiovascular: Normal rate, regular rhythm and normal heart sounds.  Exam reveals no gallop and no friction rub.   No murmur heard. Pulmonary/Chest: No respiratory distress. He has no wheezes. He has no rales. He exhibits no tenderness.  Abdominal: He exhibits no distension. There is no tenderness. There is no rebound.  Musculoskeletal: Normal range of motion. He exhibits edema and tenderness.  Using cane   Lymphadenopathy:    He has no cervical adenopathy.  Skin: No erythema.  Stasis dermatitis bilaterally  Psychiatric: He has a normal mood and affect. His behavior is normal.     Labs reviewed: Appointment on 05/11/2014  Component Date Value Ref Range Status  . Glucose 05/11/2014 121* 65 - 99 mg/dL Final  . BUN 05/11/2014 35  10 - 36 mg/dL Final  . Creatinine, Ser 05/11/2014 1.13  0.76 - 1.27 mg/dL Final  . GFR calc non Af Amer 05/11/2014 57* >59 mL/min/1.73 Final  . GFR calc Af Amer 05/11/2014 65  >59 mL/min/1.73 Final  . BUN/Creatinine Ratio 05/11/2014 31* 10 - 22 Final  . Sodium 05/11/2014 145* 134 - 144 mmol/L Final  . Potassium 05/11/2014 4.7  3.5 - 5.2 mmol/L Final  . Chloride 05/11/2014 103  97 - 108 mmol/L Final  . CO2 05/11/2014 27  18 - 29 mmol/L Final  . Calcium 05/11/2014 9.9  8.6 - 10.2 mg/dL Final  . Total Protein 05/11/2014 6.1  6.0 - 8.5 g/dL Final  . Albumin 05/11/2014 4.1  3.2 - 4.6 g/dL Final  . Globulin, Total 05/11/2014 2.0  1.5 - 4.5 g/dL Final  . Albumin/Globulin Ratio 05/11/2014 2.1  1.1 - 2.5 Final  . Total Bilirubin 05/11/2014 0.7  0.0 - 1.2 mg/dL Final  . Alkaline Phosphatase 05/11/2014 99  39 - 117 IU/L Final  . AST 05/11/2014 18  0 - 40 IU/L Final  . ALT 05/11/2014 16  0 - 44 IU/L Final  . Cholesterol, Total  05/11/2014 157  100 - 199 mg/dL Final                 **Please note reference interval change**  . Triglycerides 05/11/2014 120  0 - 149 mg/dL Final                 **Please note reference interval change**  . HDL 05/11/2014 43  >39 mg/dL Final   Comment: According to ATP-III Guidelines, HDL-C >59 mg/dL is considered a                          negative risk factor for  CHD.  Marland Kitchen VLDL Cholesterol Cal 05/11/2014 24  5 - 40 mg/dL Final  . LDL Calculated 05/11/2014 90  0 - 99 mg/dL Final                 **Please note reference interval change**  . Chol/HDL Ratio 05/11/2014 3.7  0.0 - 5.0 ratio units Final   Comment:                                   T. Chol/HDL Ratio                                                                      Men  Women                                                        1/2 Avg.Risk  3.4    3.3                                                            Avg.Risk  5.0    4.4                                                         2X Avg.Risk  9.6    7.1                                                         3X Avg.Risk 23.4   11.0  . WBC 05/11/2014 7.0  3.4 - 10.8 x10E3/uL Final  . RBC 05/11/2014 4.71  4.14 - 5.80 x10E6/uL Final  . Hemoglobin 05/11/2014 12.7  12.6 - 17.7 g/dL Final  . HCT 05/11/2014 39.9  37.5 - 51.0 % Final  . MCV 05/11/2014 85  79 - 97 fL Final  . MCH 05/11/2014 27.0  26.6 - 33.0 pg Final  . MCHC 05/11/2014 31.8  31.5 - 35.7 g/dL Final  . RDW 05/11/2014 16.0* 12.3 - 15.4 % Final  . Neutrophils Relative % 05/11/2014 71   Final  . Lymphs 05/11/2014 18   Final  . Monocytes 05/11/2014 9   Final  . Eos 05/11/2014 2   Final  . Basos 05/11/2014 0   Final  . Neutrophils Absolute 05/11/2014 5.0  1.4 - 7.0 x10E3/uL Final  . Lymphocytes Absolute 05/11/2014 1.3  0.7 - 3.1 x10E3/uL Final  . Monocytes Absolute 05/11/2014 0.6  0.1 - 0.9 x10E3/uL Final  . Eosinophils Absolute 05/11/2014 0.1  0.0 -  0.4 x10E3/uL Final  . Basophils Absolute 05/11/2014 0.0  0.0  - 0.2 x10E3/uL Final  . Immature Granulocytes 05/11/2014 0   Final  . Immature Grans (Abs) 05/11/2014 0.0  0.0 - 0.1 x10E3/uL Final  . TSH 05/11/2014 0.289* 0.450 - 4.500 uIU/mL Final  . Hgb A1c MFr Bld 05/11/2014 7.1* 4.8 - 5.6 % Final   Comment:          Pre-diabetes: 5.7 - 6.4                                   Diabetes: >6.4                                   Glycemic control for adults with diabetes: <7.0  . Est. average glucose Bld gHb Est-m* 05/11/2014 157   Final  Office Visit on 04/12/2014  Component Date Value Ref Range Status  . Vitamin B-12 04/12/2014 954* 211 - 946 pg/mL Final     Assessment/Plan  1. Essential hypertension controlled  2. Hyperthyroidism, subclinical - TSH; Future  3. Multinodular goiter Unchanged. Not tender.  4. Venous stasis dermatitis of both lower extremities Continue compression stockings. Use Vaseline or mineral oil daily to area of discoloration  5. Diabetes type 2, controlled controlled  6. Bilateral carpal tunnel syndrome. Most likely explanation of the cause of his paresthesias of the hands. Use splints and B6. If not better in a few weeks, I would suggest surgical referral.

## 2014-05-16 NOTE — Progress Notes (Signed)
Patient passed clock test.

## 2014-05-18 ENCOUNTER — Encounter (HOSPITAL_COMMUNITY): Payer: Self-pay | Admitting: *Deleted

## 2014-05-18 ENCOUNTER — Emergency Department (HOSPITAL_COMMUNITY): Payer: Medicare Other

## 2014-05-18 ENCOUNTER — Inpatient Hospital Stay (HOSPITAL_COMMUNITY)
Admission: EM | Admit: 2014-05-18 | Discharge: 2014-05-19 | DRG: 309 | Disposition: A | Discharge status: Home / Self Care | Payer: Medicare Other | Attending: Internal Medicine | Admitting: Internal Medicine

## 2014-05-18 ENCOUNTER — Ambulatory Visit: Payer: Medicare Other | Admitting: *Deleted

## 2014-05-18 DIAGNOSIS — S0083XA Contusion of other part of head, initial encounter: Secondary | ICD-10-CM

## 2014-05-18 DIAGNOSIS — N183 Chronic kidney disease, stage 3 unspecified: Secondary | ICD-10-CM | POA: Diagnosis present

## 2014-05-18 DIAGNOSIS — M179 Osteoarthritis of knee, unspecified: Secondary | ICD-10-CM | POA: Diagnosis present

## 2014-05-18 DIAGNOSIS — Y92009 Unspecified place in unspecified non-institutional (private) residence as the place of occurrence of the external cause: Secondary | ICD-10-CM | POA: Diagnosis not present

## 2014-05-18 DIAGNOSIS — E119 Type 2 diabetes mellitus without complications: Secondary | ICD-10-CM | POA: Diagnosis present

## 2014-05-18 DIAGNOSIS — D181 Lymphangioma, any site: Secondary | ICD-10-CM | POA: Diagnosis present

## 2014-05-18 DIAGNOSIS — M170 Bilateral primary osteoarthritis of knee: Secondary | ICD-10-CM | POA: Diagnosis not present

## 2014-05-18 DIAGNOSIS — W010XXA Fall on same level from slipping, tripping and stumbling without subsequent striking against object, initial encounter: Secondary | ICD-10-CM | POA: Diagnosis present

## 2014-05-18 DIAGNOSIS — T07 Unspecified multiple injuries: Secondary | ICD-10-CM | POA: Diagnosis not present

## 2014-05-18 DIAGNOSIS — E785 Hyperlipidemia, unspecified: Secondary | ICD-10-CM | POA: Diagnosis present

## 2014-05-18 DIAGNOSIS — M25562 Pain in left knee: Secondary | ICD-10-CM | POA: Diagnosis not present

## 2014-05-18 DIAGNOSIS — I509 Heart failure, unspecified: Secondary | ICD-10-CM

## 2014-05-18 DIAGNOSIS — I10 Essential (primary) hypertension: Secondary | ICD-10-CM | POA: Diagnosis present

## 2014-05-18 DIAGNOSIS — Z87891 Personal history of nicotine dependence: Secondary | ICD-10-CM | POA: Diagnosis not present

## 2014-05-18 DIAGNOSIS — Z885 Allergy status to narcotic agent status: Secondary | ICD-10-CM | POA: Diagnosis not present

## 2014-05-18 DIAGNOSIS — Z79899 Other long term (current) drug therapy: Secondary | ICD-10-CM

## 2014-05-18 DIAGNOSIS — Z8711 Personal history of peptic ulcer disease: Secondary | ICD-10-CM | POA: Diagnosis not present

## 2014-05-18 DIAGNOSIS — K219 Gastro-esophageal reflux disease without esophagitis: Secondary | ICD-10-CM | POA: Diagnosis present

## 2014-05-18 DIAGNOSIS — Z96611 Presence of right artificial shoulder joint: Secondary | ICD-10-CM | POA: Diagnosis present

## 2014-05-18 DIAGNOSIS — G609 Hereditary and idiopathic neuropathy, unspecified: Secondary | ICD-10-CM | POA: Diagnosis present

## 2014-05-18 DIAGNOSIS — I4891 Unspecified atrial fibrillation: Principal | ICD-10-CM | POA: Diagnosis present

## 2014-05-18 DIAGNOSIS — S61412A Laceration without foreign body of left hand, initial encounter: Secondary | ICD-10-CM | POA: Diagnosis not present

## 2014-05-18 DIAGNOSIS — IMO0002 Reserved for concepts with insufficient information to code with codable children: Secondary | ICD-10-CM

## 2014-05-18 DIAGNOSIS — S0990XA Unspecified injury of head, initial encounter: Secondary | ICD-10-CM | POA: Diagnosis not present

## 2014-05-18 DIAGNOSIS — R918 Other nonspecific abnormal finding of lung field: Secondary | ICD-10-CM | POA: Diagnosis not present

## 2014-05-18 DIAGNOSIS — S0993XA Unspecified injury of face, initial encounter: Secondary | ICD-10-CM | POA: Diagnosis not present

## 2014-05-18 DIAGNOSIS — I5032 Chronic diastolic (congestive) heart failure: Secondary | ICD-10-CM | POA: Diagnosis present

## 2014-05-18 DIAGNOSIS — W19XXXA Unspecified fall, initial encounter: Secondary | ICD-10-CM | POA: Diagnosis not present

## 2014-05-18 DIAGNOSIS — Z23 Encounter for immunization: Secondary | ICD-10-CM

## 2014-05-18 DIAGNOSIS — S61215A Laceration without foreign body of left ring finger without damage to nail, initial encounter: Secondary | ICD-10-CM | POA: Diagnosis not present

## 2014-05-18 DIAGNOSIS — S199XXA Unspecified injury of neck, initial encounter: Secondary | ICD-10-CM | POA: Diagnosis not present

## 2014-05-18 DIAGNOSIS — I129 Hypertensive chronic kidney disease with stage 1 through stage 4 chronic kidney disease, or unspecified chronic kidney disease: Secondary | ICD-10-CM | POA: Diagnosis present

## 2014-05-18 DIAGNOSIS — I5030 Unspecified diastolic (congestive) heart failure: Secondary | ICD-10-CM | POA: Diagnosis not present

## 2014-05-18 DIAGNOSIS — S0181XA Laceration without foreign body of other part of head, initial encounter: Secondary | ICD-10-CM | POA: Diagnosis present

## 2014-05-18 DIAGNOSIS — G8929 Other chronic pain: Secondary | ICD-10-CM | POA: Diagnosis present

## 2014-05-18 DIAGNOSIS — S8992XA Unspecified injury of left lower leg, initial encounter: Secondary | ICD-10-CM | POA: Diagnosis not present

## 2014-05-18 DIAGNOSIS — Y9301 Activity, walking, marching and hiking: Secondary | ICD-10-CM | POA: Diagnosis not present

## 2014-05-18 DIAGNOSIS — R0781 Pleurodynia: Secondary | ICD-10-CM | POA: Diagnosis not present

## 2014-05-18 DIAGNOSIS — T148 Other injury of unspecified body region: Secondary | ICD-10-CM | POA: Diagnosis not present

## 2014-05-18 DIAGNOSIS — R9389 Abnormal findings on diagnostic imaging of other specified body structures: Secondary | ICD-10-CM

## 2014-05-18 DIAGNOSIS — R000 Tachycardia, unspecified: Secondary | ICD-10-CM | POA: Diagnosis not present

## 2014-05-18 DIAGNOSIS — S299XXA Unspecified injury of thorax, initial encounter: Secondary | ICD-10-CM | POA: Diagnosis not present

## 2014-05-18 LAB — CBC WITH DIFFERENTIAL/PLATELET
BASOS ABS: 0 10*3/uL (ref 0.0–0.1)
BASOS PCT: 0 % (ref 0–1)
EOS PCT: 0 % (ref 0–5)
Eosinophils Absolute: 0 10*3/uL (ref 0.0–0.7)
HEMATOCRIT: 44.3 % (ref 39.0–52.0)
Hemoglobin: 14.1 g/dL (ref 13.0–17.0)
Lymphocytes Relative: 6 % — ABNORMAL LOW (ref 12–46)
Lymphs Abs: 0.6 10*3/uL — ABNORMAL LOW (ref 0.7–4.0)
MCH: 27.2 pg (ref 26.0–34.0)
MCHC: 31.8 g/dL (ref 30.0–36.0)
MCV: 85.4 fL (ref 78.0–100.0)
MONO ABS: 0.2 10*3/uL (ref 0.1–1.0)
Monocytes Relative: 2 % — ABNORMAL LOW (ref 3–12)
NEUTROS ABS: 9.9 10*3/uL — AB (ref 1.7–7.7)
Neutrophils Relative %: 92 % — ABNORMAL HIGH (ref 43–77)
Platelets: 205 10*3/uL (ref 150–400)
RBC: 5.19 MIL/uL (ref 4.22–5.81)
RDW: 16.1 % — AB (ref 11.5–15.5)
WBC: 10.7 10*3/uL — ABNORMAL HIGH (ref 4.0–10.5)

## 2014-05-18 LAB — COMPREHENSIVE METABOLIC PANEL
ALT: 20 U/L (ref 0–53)
AST: 27 U/L (ref 0–37)
Albumin: 3.9 g/dL (ref 3.5–5.2)
Alkaline Phosphatase: 114 U/L (ref 39–117)
Anion gap: 15 (ref 5–15)
BUN: 36 mg/dL — ABNORMAL HIGH (ref 6–23)
CO2: 23 mEq/L (ref 19–32)
CREATININE: 1.12 mg/dL (ref 0.50–1.35)
Calcium: 10.5 mg/dL (ref 8.4–10.5)
Chloride: 95 mEq/L — ABNORMAL LOW (ref 96–112)
GFR calc Af Amer: 64 mL/min — ABNORMAL LOW (ref >90)
GFR calc non Af Amer: 55 mL/min — ABNORMAL LOW (ref >90)
Glucose, Bld: 179 mg/dL — ABNORMAL HIGH (ref 70–99)
Potassium: 4.6 mEq/L (ref 3.7–5.3)
Sodium: 133 mEq/L — ABNORMAL LOW (ref 137–147)
TOTAL PROTEIN: 7.3 g/dL (ref 6.0–8.3)
Total Bilirubin: 0.9 mg/dL (ref 0.3–1.2)

## 2014-05-18 LAB — URINALYSIS, ROUTINE W REFLEX MICROSCOPIC
Bilirubin Urine: NEGATIVE
Glucose, UA: NEGATIVE mg/dL
Ketones, ur: NEGATIVE mg/dL
Leukocytes, UA: NEGATIVE
NITRITE: NEGATIVE
Protein, ur: NEGATIVE mg/dL
SPECIFIC GRAVITY, URINE: 1.01 (ref 1.005–1.030)
Urobilinogen, UA: 0.2 mg/dL (ref 0.0–1.0)
pH: 6.5 (ref 5.0–8.0)

## 2014-05-18 LAB — PRO B NATRIURETIC PEPTIDE: PRO B NATRI PEPTIDE: 1647 pg/mL — AB (ref 0–450)

## 2014-05-18 LAB — TROPONIN I: Troponin I: <0.3 ng/mL (ref <0.30)

## 2014-05-18 LAB — URINE MICROSCOPIC-ADD ON

## 2014-05-18 MED ORDER — SIMVASTATIN 10 MG PO TABS
10.0000 mg | ORAL_TABLET | Freq: Every day | ORAL | Status: DC
  Filled 2014-05-18: qty 1

## 2014-05-18 MED ORDER — CALCIUM CARBONATE-VITAMIN D 500-200 MG-UNIT PO TABS
1.0000 | ORAL_TABLET | Freq: Two times a day (BID) | ORAL | Status: DC
  Administered 2014-05-19 (×2): 1 via ORAL
  Filled 2014-05-18 (×3): qty 1

## 2014-05-18 MED ORDER — LIDOCAINE HCL 2 % IJ SOLN
20.0000 mL | Freq: Once | INTRAMUSCULAR | Status: AC
  Administered 2014-05-18: 400 mg
  Filled 2014-05-18: qty 20

## 2014-05-18 MED ORDER — GLUCOSAMINE 1500 COMPLEX PO CAPS: ORAL_CAPSULE | Freq: Every day | ORAL | Status: DC

## 2014-05-18 MED ORDER — ACETAMINOPHEN 325 MG PO TABS
650.0000 mg | ORAL_TABLET | Freq: Four times a day (QID) | ORAL | Status: DC | PRN
  Administered 2014-05-19: 650 mg via ORAL
  Filled 2014-05-18: qty 2

## 2014-05-18 MED ORDER — LISINOPRIL 20 MG PO TABS
20.0000 mg | ORAL_TABLET | Freq: Every day | ORAL | Status: DC
  Administered 2014-05-19: 20 mg via ORAL
  Filled 2014-05-18: qty 1

## 2014-05-18 MED ORDER — ACETAMINOPHEN 325 MG PO TABS
650.0000 mg | ORAL_TABLET | Freq: Once | ORAL | Status: AC
  Administered 2014-05-18: 650 mg via ORAL
  Filled 2014-05-18: qty 2

## 2014-05-18 MED ORDER — ADULT MULTIVITAMIN W/MINERALS CH
1.0000 | ORAL_TABLET | Freq: Every day | ORAL | Status: DC
  Administered 2014-05-19: 1 via ORAL
  Filled 2014-05-18: qty 1

## 2014-05-18 MED ORDER — SODIUM CHLORIDE 0.9 % IJ SOLN
3.0000 mL | Freq: Two times a day (BID) | INTRAMUSCULAR | Status: DC
  Administered 2014-05-19 (×2): 3 mL via INTRAVENOUS

## 2014-05-18 MED ORDER — APIXABAN 5 MG PO TABS
5.0000 mg | ORAL_TABLET | Freq: Two times a day (BID) | ORAL | Status: DC
  Administered 2014-05-19 (×2): 5 mg via ORAL
  Filled 2014-05-18 (×3): qty 1

## 2014-05-18 MED ORDER — METOPROLOL TARTRATE 1 MG/ML IV SOLN
5.0000 mg | INTRAVENOUS | Status: DC | PRN
  Administered 2014-05-18: 5 mg via INTRAVENOUS
  Filled 2014-05-18: qty 5

## 2014-05-18 MED ORDER — METOPROLOL TARTRATE 1 MG/ML IV SOLN
5.0000 mg | INTRAVENOUS | Status: AC | PRN
  Administered 2014-05-18: 5 mg via INTRAVENOUS
  Filled 2014-05-18: qty 5

## 2014-05-18 MED ORDER — METOPROLOL TARTRATE 1 MG/ML IV SOLN
5.0000 mg | Freq: Once | INTRAVENOUS | Status: AC
  Administered 2014-05-18: 5 mg via INTRAVENOUS
  Filled 2014-05-18: qty 5

## 2014-05-18 MED ORDER — METOPROLOL TARTRATE 25 MG PO TABS
25.0000 mg | ORAL_TABLET | Freq: Two times a day (BID) | ORAL | Status: DC
  Administered 2014-05-19 (×2): 25 mg via ORAL
  Filled 2014-05-18 (×2): qty 1

## 2014-05-18 MED ORDER — OMEGA-3-ACID ETHYL ESTERS 1 G PO CAPS
1.0000 g | ORAL_CAPSULE | Freq: Every day | ORAL | Status: DC
  Administered 2014-05-19: 1 g via ORAL
  Filled 2014-05-18: qty 1

## 2014-05-18 NOTE — ED Notes (Signed)
Bed: RESB Expected date:  Expected time:  Means of arrival:  Comments: Fall 

## 2014-05-18 NOTE — Progress Notes (Signed)
  CARE MANAGEMENT ED NOTE 05/18/2014  Patient:  Logan Bean,Logan Bean   Account Number:  [id number]  Date Initiated:  05/18/2014  Documentation initiated by:  Livia Snellen  Subjective/Objective Assessment:   Patient presents to Ed post fall.  Patient on blood thinners     Subjective/Objective Assessment Detail:     Action/Plan:   Action/Plan Detail:   Anticipated DC Date:       Status Recommendation to Physician:   Result of Recommendation:    Other ED Services  Consult Working [place]  Other    Choice offered to / List presented to:            Status of service:  Completed, signed off  ED Comments:   ED Comments Detail:  EDCM spoke to patient and his very close friends at bedside.  Patient lives in the in law suite at his friends house.  Patient's friend Logan Bean is patient's POA. Patient has a walking stick at home, no other medical equipment.  Patient reports that he has [facility] home health care in the past when he broke his foot.  Patient does not have home health services currently.  Patient reports he is able to perform his ADL's on his own. Patient confirms his pcp is Dr. Bubba Camp.  CM consult placed to follow for discharge needs.  No further EDCM needs at this time.

## 2014-05-18 NOTE — H&P (Signed)
[facility] History and Physical  [name] KZS:[id number] DOB: 15-Oct-1920 DOA: 05/18/2014  Referring physician: ED physician PCP: Blanchie Serve, MD  Specialists:   Chief Complaint: fall and skin laceration  HPI: Logan Bean is a 80 y.o. male with past medical history of A. Fib on Eliquis, hypertension, hyperlipidemia, GERD, diastolic congestive heart failure, diabetes, who presented with fall and skin laceration, and a fib with RVR.  Patient reports that at about 5:00 PM, he was walking downhill and his legs started to go faster than he could keep up with.  When he tried to turn around, he lost his balance and fell forward. He reports that he hit his chin on the pavement. No loss of consciousness. Patient complaining of headache, facial and chin pain. Is also complaining of left hand and left knee pain. He is not experiencing any back pain. Denies hip and pelvis pain. Family reports that he complained of dizziness prior to the fall. Patient does not have chest pain or shortness of breath. No weakness or numbness in his extremities. No nausea, vomiting, abdominal pain, diarrhea, symptoms for UTI.   CT scan of head, maxillofacial bones, cervical spine was negative for any intracranial injury or fractures. There is a chronic left frontal hygroma with minimal mass effect on the frontal lobe on CT-head. Patient required sutures in his chin as well as left hand. Left hand and left knee x-rays were negative.  In ED, patient was found to be in atrial fibrillation with rapid ventricular response upon arrival. After received IV lopressor in the ER, 5 mg IV 3, his HR improved to 120 to 130/min when I evaluated patient. He is admitted to inpatient for further evaluation and treatment.  Review of Systems: As presented in the history of presenting illness, rest negative.  Where does patient live?  Lives at home  Can patient participate in ADLs? yes  Allergy:  Allergies  Allergen Reactions   . Morphine And Related Nausea And Vomiting  . Oxycodone Nausea And Vomiting    Past Medical History  Diagnosis Date  . Hypertension   . Hypercholesteremia   . Stomach ulcer     years  . H/O hiatal hernia   . Varicose veins     both legs  . Arthritis     left knee  . Diabetes mellitus     diet controlled, pt does not check cbg  . Left rotator cuff tear Jul 12, 2012  . Edema   . Unspecified hereditary and idiopathic peripheral neuropathy   . Dizziness and giddiness   . Unspecified vitamin D deficiency   . Diaphragmatic hernia without mention of obstruction or gangrene     Past Surgical History  Procedure Laterality Date  . Esopagus strectching  2003 and 3 yrs ago  . Tonsillectomy  age 44  . Shoulder open rotator cuff repair  11/05/2011    Procedure: ROTATOR CUFF REPAIR SHOULDER OPEN;  Surgeon: Tobi Bastos, MD;  Location: [facility];  Service: Orthopedics;  Laterality: Left;  Left Shoulder Rotator Cuff Repair Open with Graft and Anchors  . Reverse shoulder arthroplasty Right 09/17/2012    Procedure: REVERSE SHOULDER ARTHROPLASTY;  Surgeon: Augustin Schooling, MD;  Location: [place];  Service: Orthopedics;  Laterality: Right;  . Orif ankle fracture Right 09/20/2012    Procedure: OPEN REDUCTION INTERNAL FIXATION (ORIF) ANKLE FRACTURE;  Surgeon: Johnn Hai, MD;  Location: [facility];  Service: Orthopedics;  Laterality: Right;  . Mastectomy  bilateral  . Cardioversion N/A 02/22/2013    Procedure: CARDIOVERSION;  Surgeon: Darlin Coco, MD;  Location: [facility];  Service: Cardiovascular;  Laterality: N/A;    Social History:  reports that he quit smoking about 17 years ago. His smoking use included Pipe and Cigarettes. He smoked 0.00 packs per day for 50 years. He has never used smokeless tobacco. He reports that he drinks alcohol. He reports that he does not use illicit drugs.  Family History:  Family History  Problem Relation Age of Onset  . Parkinson's disease Mother   .  Hip fracture Mother   . Heart disease Mother   . Alzheimer's disease Mother   . Heart disease Father   . Parkinson's disease Sister   . Heart disease Brother      Prior to Admission medications   Medication Sig Start Date End Date Taking? Authorizing Provider  acetaminophen (TYLENOL) 650 MG CR tablet Take 1,300 mg by mouth daily as needed (joint pain).    Yes Historical Provider, MD  apixaban (ELIQUIS) 5 MG TABS tablet Take 5 mg by mouth 2 (two) times daily.   Yes Historical Provider, MD  Calcium Carbonate-Vitamin D 600-200 MG-UNIT TABS Take by mouth 2 (two) times daily. Take 1 tablet daily for vitamin supplement.   Yes Historical Provider, MD  furosemide (LASIX) 20 MG tablet Take 10 mg by mouth daily.   Yes Historical Provider, MD  Glucosamine-Chondroit-Vit C-Mn (GLUCOSAMINE 1500 COMPLEX) CAPS Take one tablet once a day 01/12/13  Yes Mahima Pandey, MD  glucose blood test strip True Test Glucose Test Strips Check blood sugar once daily Dx: E11.9 05/01/14  Yes Tiffany L Reed, DO  glucose monitoring kit (FREESTYLE) monitoring kit 1 each by Does not apply route as needed for other. Take blood sugar once daily. 01/19/13  Yes Mahima Pandey, MD  lisinopril (PRINIVIL,ZESTRIL) 20 MG tablet Take 20 mg by mouth daily.   Yes Historical Provider, MD  metFORMIN (GLUCOPHAGE) 500 MG tablet Take one tablet of 500 mg every other day for your diabetes 10/11/13  Yes Mahima Bubba Camp, MD  metoprolol tartrate (LOPRESSOR) 25 MG tablet Take 25 mg by mouth 2 (two) times daily.   Yes Historical Provider, MD  Multiple Vitamin (MULITIVITAMIN WITH MINERALS) TABS Take 1 tablet by mouth daily. Take 1 tablet daily for vitamin supplement.   Yes Historical Provider, MD  Omega-3 Fatty Acids (FISH OIL) 1000 MG CAPS Take 1,000 mg by mouth once.   Yes Historical Provider, MD  simvastatin (ZOCOR) 10 MG tablet Take 10 mg by mouth at bedtime.   Yes Historical Provider, MD  TRUEPLUS LANCETS 30G MISC Use to check blood sugar once daily. Dx  E11.9 05/01/14  Yes Tiffany L Reed, DO  zoster vaccine live, PF, (ZOSTAVAX) 38466 UNT/0.65ML injection Inject 19,400 Units into the skin once. 04/12/14  Yes Mahima Pandey, MD  ELIQUIS 5 MG TABS tablet TAKE 1 TABLET TWICE DAILY. 03/06/14   Blanchie Serve, MD  furosemide (LASIX) 20 MG tablet Take half a tablet of 20 mg = 10 mg once a day for edema, Hypertension 10/11/13   Blanchie Serve, MD  lisinopril (PRINIVIL,ZESTRIL) 20 MG tablet TAKE 1 TABLET DAILY FOR BLOOD PRESSURE. 01/17/14   Blanchie Serve, MD  metoprolol tartrate (LOPRESSOR) 25 MG tablet TAKE 1 TABLET TWICE DAILY. 04/19/14   Minus Breeding, MD  simvastatin (ZOCOR) 10 MG tablet TAKE ONE TABLET AT BEDTIME. 01/17/14   Blanchie Serve, MD    Physical Exam: Filed Vitals:   05/19/14 0028 05/19/14  0042 05/19/14 0045 05/19/14 0049  BP:  161/79 156/74 160/96  Pulse:  84 122 141  Temp:  98.1 F (36.7 C)    TempSrc:  Oral    Resp:  '20 20 20  ' Height: '5\' 5"'  (1.651 m)     Weight: 68.1 kg (150 lb 2.1 oz)     SpO2:  98% 97% 98%   General: Not in acute distress HEENT: Head is with contusion (nasal and periorbital)       Eyes: PERRL, EOMI, no scleral icterus       ENT:  Sinus tenderness present. No nasal septal hematoma. No epistaxis.         Neck: No JVD, no bruit, no mass felt. Cardiac: S1/S2, RRR, No murmurs, gallops or rubs Pulm: Good air movement bilaterally. Clear to auscultation bilaterally. No rales, wheezing, rhonchi or rubs. Abd: Soft, nondistended, nontender, no rebound pain, no organomegaly, BS present Ext: No edema bilaterally. 2+DP/PT pulse bilaterally Musculoskeletal: No joint deformities, erythema, or stiffness, ROM full Skin:  Laceration (left hand, chin). No rash noted. No cyanosis.  Neuro: Alert and oriented X3, cranial nerves II-XII grossly intact, muscle strength 5/5 in all extremeties, sensation to light touch intact. Brachial reflex 2+ bilaterally. Knee reflex 1+ bilaterally. Negative Babinski's sign. Normal finger to nose  test. Psych: Patient is not psychotic, no suicidal or hemocidal ideation.  Labs on Admission:  Basic Metabolic Panel:  Recent Labs Lab 05/18/14 1940  NA 133*  K 4.6  CL 95*  CO2 23  GLUCOSE 179*  BUN 36*  CREATININE 1.12  CALCIUM 10.5   Liver Function Tests:  Recent Labs Lab 05/18/14 1940  AST 27  ALT 20  ALKPHOS 114  BILITOT 0.9  PROT 7.3  ALBUMIN 3.9   No results for input(s): LIPASE, AMYLASE in the last 168 hours. No results for input(s): AMMONIA in the last 168 hours. CBC:  Recent Labs Lab 05/18/14 1940  WBC 10.7*  NEUTROABS 9.9*  HGB 14.1  HCT 44.3  MCV 85.4  PLT 205   Cardiac Enzymes:  Recent Labs Lab 05/18/14 1940 05/18/14 2328  TROPONINI <0.30 <0.30    BNP (last 3 results)  Recent Labs  05/18/14 1940  PROBNP 1647.0*   CBG: No results for input(s): GLUCAP in the last 168 hours.  Radiological Exams on Admission: Ct Head Wo Contrast  05/18/2014   CLINICAL DATA:  Fall.  EXAM: CT HEAD WITHOUT CONTRAST  CT MAXILLOFACIAL WITHOUT CONTRAST  CT CERVICAL SPINE WITHOUT CONTRAST  TECHNIQUE: Multidetector CT imaging of the head, cervical spine, and maxillofacial structures were performed using the standard protocol without intravenous contrast. Multiplanar CT image reconstructions of the cervical spine and maxillofacial structures were also generated.  COMPARISON:  Head and cervical spine CT 07/13/2011  FINDINGS: CT HEAD FINDINGS  Skull and Sinuses:Negative for calvarial fracture.  Orbits: No traumatic findings.  Brain: There is a chronic CSF density extra-axial fluid collection around the left frontal lobe with mild mass effect.  No acute intracranial hemorrhage, acute infarct, hydrocephalus, mass lesion, or midline shift. There is generalized brain atrophy which has progressed from 2012. Low-attenuation in the deep cerebral white matter consistent with chronic small vessel disease.  CT MAXILLOFACIAL FINDINGS  Soft tissue swelling over the nose, especially  on the left, and around the left cheek. There is subcutaneous gas at the anterior margin of the cartilaginous nasal septum. The septum is midline. There is no bony fracture identified. Status post bilateral cataract resection. No evidence of globe injury  or postseptal hemorrhage. Incidental torus mandibularis and palatini. No sinus effusion.  CT CERVICAL SPINE FINDINGS  No evidence of acute fracture or traumatic malalignment. There is C3-4 retrolisthesis which is chronic and unchanged from 2012. No gross cervical canal hematoma or prevertebral swelling.  Diffuse degenerative disc narrowing without significant spurring.  Large right lobe thyroid nodule, at least 4 cm in AP dimension, stable from 2012. Interstitial coarsening in the apical lungs is likely chronic lung scarring. There are also small emphysematous bases.  IMPRESSION: 1. No acute intracranial findings or cervical spine fracture. 2. Soft tissue/ cartilaginous injury of the nose. No facial bone fracture. 3. Chronic left frontal hygroma with minimal mass effect on the frontal lobe.   Electronically Signed   By: Jorje Guild M.D.   On: 05/18/2014 19:54   Ct Cervical Spine Wo Contrast  05/18/2014   CLINICAL DATA:  Fall.  EXAM: CT HEAD WITHOUT CONTRAST  CT MAXILLOFACIAL WITHOUT CONTRAST  CT CERVICAL SPINE WITHOUT CONTRAST  TECHNIQUE: Multidetector CT imaging of the head, cervical spine, and maxillofacial structures were performed using the standard protocol without intravenous contrast. Multiplanar CT image reconstructions of the cervical spine and maxillofacial structures were also generated.  COMPARISON:  Head and cervical spine CT 07/13/2011  FINDINGS: CT HEAD FINDINGS  Skull and Sinuses:Negative for calvarial fracture.  Orbits: No traumatic findings.  Brain: There is a chronic CSF density extra-axial fluid collection around the left frontal lobe with mild mass effect.  No acute intracranial hemorrhage, acute infarct, hydrocephalus, mass lesion, or  midline shift. There is generalized brain atrophy which has progressed from 2012. Low-attenuation in the deep cerebral white matter consistent with chronic small vessel disease.  CT MAXILLOFACIAL FINDINGS  Soft tissue swelling over the nose, especially on the left, and around the left cheek. There is subcutaneous gas at the anterior margin of the cartilaginous nasal septum. The septum is midline. There is no bony fracture identified. Status post bilateral cataract resection. No evidence of globe injury or postseptal hemorrhage. Incidental torus mandibularis and palatini. No sinus effusion.  CT CERVICAL SPINE FINDINGS  No evidence of acute fracture or traumatic malalignment. There is C3-4 retrolisthesis which is chronic and unchanged from 2012. No gross cervical canal hematoma or prevertebral swelling.  Diffuse degenerative disc narrowing without significant spurring.  Large right lobe thyroid nodule, at least 4 cm in AP dimension, stable from 2012. Interstitial coarsening in the apical lungs is likely chronic lung scarring. There are also small emphysematous bases.  IMPRESSION: 1. No acute intracranial findings or cervical spine fracture. 2. Soft tissue/ cartilaginous injury of the nose. No facial bone fracture. 3. Chronic left frontal hygroma with minimal mass effect on the frontal lobe.   Electronically Signed   By: Jorje Guild M.D.   On: 05/18/2014 19:54   Dg Knee Complete 4 Views Left  05/18/2014   CLINICAL DATA:  Pt was on his daily walk when he fell forward and hit his face. Left anterior knee pain.Pt states he had an injection in his left knee today and that he gets them every month due to chronic pain.  EXAM: LEFT KNEE - COMPLETE 4+ VIEW  COMPARISON:  09/11/2012  FINDINGS: Three compartment osteoarthritis again identified. Moderate to severe. Joint space narrowing with subchondral sclerosis and especially lateral osteophytes. No acute fracture or dislocation. Vascular calcifications. No joint  effusion.  IMPRESSION: Degenerative change, without acute osseous finding.   Electronically Signed   By: Abigail Miyamoto M.D.   On: 05/18/2014 19:14  Dg Hand Complete Left  05/18/2014   CLINICAL DATA:  Laceration to the left ring and pinky finger. Left hand pain.  EXAM: LEFT HAND - COMPLETE 3+ VIEW  COMPARISON:  None.  FINDINGS: There is a diffuse osteopenia. Joint space narrowing in the DIP and PIP joints. Degenerative changes at the first carpometacarpal joint. Narrowing at the radiocarpal joint. Negative for a fracture or dislocation.  IMPRESSION: No acute bone abnormality in the left hand.  Diffuse osteoarthritis with osteopenia.   Electronically Signed   By: Markus Daft M.D.   On: 05/18/2014 19:12   Ct Maxillofacial Wo Cm  05/18/2014   CLINICAL DATA:  Fall.  EXAM: CT HEAD WITHOUT CONTRAST  CT MAXILLOFACIAL WITHOUT CONTRAST  CT CERVICAL SPINE WITHOUT CONTRAST  TECHNIQUE: Multidetector CT imaging of the head, cervical spine, and maxillofacial structures were performed using the standard protocol without intravenous contrast. Multiplanar CT image reconstructions of the cervical spine and maxillofacial structures were also generated.  COMPARISON:  Head and cervical spine CT 07/13/2011  FINDINGS: CT HEAD FINDINGS  Skull and Sinuses:Negative for calvarial fracture.  Orbits: No traumatic findings.  Brain: There is a chronic CSF density extra-axial fluid collection around the left frontal lobe with mild mass effect.  No acute intracranial hemorrhage, acute infarct, hydrocephalus, mass lesion, or midline shift. There is generalized brain atrophy which has progressed from 2012. Low-attenuation in the deep cerebral white matter consistent with chronic small vessel disease.  CT MAXILLOFACIAL FINDINGS  Soft tissue swelling over the nose, especially on the left, and around the left cheek. There is subcutaneous gas at the anterior margin of the cartilaginous nasal septum. The septum is midline. There is no bony fracture  identified. Status post bilateral cataract resection. No evidence of globe injury or postseptal hemorrhage. Incidental torus mandibularis and palatini. No sinus effusion.  CT CERVICAL SPINE FINDINGS  No evidence of acute fracture or traumatic malalignment. There is C3-4 retrolisthesis which is chronic and unchanged from 2012. No gross cervical canal hematoma or prevertebral swelling.  Diffuse degenerative disc narrowing without significant spurring.  Large right lobe thyroid nodule, at least 4 cm in AP dimension, stable from 2012. Interstitial coarsening in the apical lungs is likely chronic lung scarring. There are also small emphysematous bases.  IMPRESSION: 1. No acute intracranial findings or cervical spine fracture. 2. Soft tissue/ cartilaginous injury of the nose. No facial bone fracture. 3. Chronic left frontal hygroma with minimal mass effect on the frontal lobe.   Electronically Signed   By: Jorje Guild M.D.   On: 05/18/2014 19:54    EKG: Independently reviewed. A fib with RVR  Assessment/Plan Principal Problem:   Atrial fibrillation with RVR Active Problems:   Diabetes type 2, controlled   HTN (hypertension)   CHF (congestive heart failure)   CKD (chronic kidney disease) stage 3, GFR 30-59 ml/min   Laceration of skin of face   Fall   Hygroma  A fib with RVR: likely triggered by his fall (alternatively A. Fib with RVR may have caused fall).  Patient does not have chest pain or shortness of breath. Initial troponin is negative. He responded to IV metoprolol. - Patient was initially admitted for SDU, but changed to tele since his HR is about 100/min and no SUD bed available - prn Metoprolol 5 mg.  - will start cardizem gtt if HR is persistently elevated above 130/min. Currently is 100 to 110/min - trop x 3 - repeat EKG in AM - continue oral metoprolol 25 mg  twice a day - Start low-dose Cardizem 30 mg twice a day - continue Eliquis  Fall: seems to be mechanical etiology, but  could be due to orthostatic status given lasix use. CT-head is negative for acute abnormalities except for a chronic left frontal hygroma with minimal mass effect on the frontal lobe. Examination has no focal neurologic findings. No signs of stroke.  -will hold lasix (patient is euvolemic on admission) -check orthostatic signs  DM-II: on metformin at home. A1c was 7.1 on 05/11/14. - switch  metformin to SSI  Diastolic congestive heart failure: 2-D echo on 01/19/03 showed EF 50-55%. He is on Lasix 20 mg daily at home. His euvolemic on admission. No leg edema.  - hold Lasix because of possible orthostatic status. - check Pro BNP   HTN: continue metoprolol and added Cardizem for A fib with RVR  CKD-II: cre is 1.12 on admission - follow renal fx by BMP  Skin laceration: He required sutures. will require suture removal in 7-10 days.  Left frontal hygroma: CT-head showed a chronic left frontal hygroma with minimal mass effect on the frontal lobe. No focal neurological findings - Observe patient closely   DVT ppx: on Eliquis, SCD  Code Status: Full code Family Communication:  Yes, patient's  Friend and POA (Ms. Charlestine Massed)  at bed side Disposition Plan: Admit to inpatient   Date of Service 05/19/2014    Ivor Costa [facility] Pager [phone]  If 7PM-7AM, please contact night-coverage [email or web address] Password [email or web address] 05/19/2014, 1:23 AM

## 2014-05-18 NOTE — ED Notes (Signed)
Pt cleaned up and changed into gown, skin tear on R elbow has drsg over, face and chin cleaned w/ NS, L ring/pinkie finger cleaned w/ NS

## 2014-05-18 NOTE — ED Provider Notes (Signed)
CSN: [id number]     Arrival date & time 05/18/14  1809 History   First MD Initiated Contact with Patient 05/18/14 Talent     Chief Complaint  Patient presents with  . Fall     (Consider location/radiation/quality/duration/timing/severity/associated sxs/prior Treatment) HPI Comments: Patient presents to the ER for evaluation after a fall. Patient reports that he was walking and lost his balance. He reports that he was walking downhill and his legs started to go faster than he could keep up with and he fell forward. He hit his chin on the pavement. No loss of consciousness. Patient complaining of headache and facial and chin pain. Is also complaining of left hand and left knee pain.patient denies chest pain, shortness of breath. He is not experiencing any back pain. Denies hip and pelvis pain. Family reports that he complained of dizziness prior to the fall.  Patient is a 80 y.o. male presenting with fall.  Fall Associated symptoms include headaches. Pertinent negatives include no chest pain and no shortness of breath.    Past Medical History  Diagnosis Date  . Hypertension   . Hypercholesteremia   . Stomach ulcer     years  . H/O hiatal hernia   . Varicose veins     both legs  . Arthritis     left knee  . Diabetes mellitus     diet controlled, pt does not check cbg  . Left rotator cuff tear Jul 12, 2012  . Edema   . Unspecified hereditary and idiopathic peripheral neuropathy   . Dizziness and giddiness   . Unspecified vitamin D deficiency   . Diaphragmatic hernia without mention of obstruction or gangrene    Past Surgical History  Procedure Laterality Date  . Esopagus strectching  2003 and 3 yrs ago  . Tonsillectomy  age 20  . Shoulder open rotator cuff repair  11/05/2011    Procedure: ROTATOR CUFF REPAIR SHOULDER OPEN;  Surgeon: Tobi Bastos, MD;  Location: [facility];  Service: Orthopedics;  Laterality: Left;  Left Shoulder Rotator Cuff Repair Open with Graft and Anchors  .  Reverse shoulder arthroplasty Right 09/17/2012    Procedure: REVERSE SHOULDER ARTHROPLASTY;  Surgeon: Augustin Schooling, MD;  Location: [place];  Service: Orthopedics;  Laterality: Right;  . Orif ankle fracture Right 09/20/2012    Procedure: OPEN REDUCTION INTERNAL FIXATION (ORIF) ANKLE FRACTURE;  Surgeon: Johnn Hai, MD;  Location: [facility];  Service: Orthopedics;  Laterality: Right;  . Mastectomy      bilateral  . Cardioversion N/A 02/22/2013    Procedure: CARDIOVERSION;  Surgeon: Darlin Coco, MD;  Location: [facility];  Service: Cardiovascular;  Laterality: N/A;   Family History  Problem Relation Age of Onset  . Parkinson's disease Mother   . Hip fracture Mother   . Heart disease Mother   . Alzheimer's disease Mother   . Heart disease Father   . Parkinson's disease Sister   . Heart disease Brother    History  Substance Use Topics  . Smoking status: Former Smoker -- 6 years    Types: Pipe, Cigarettes    Quit date: 07/14/1996  . Smokeless tobacco: Never Used  . Alcohol Use: Yes     Comment: occasional beer    Review of Systems  HENT: Positive for facial swelling.   Respiratory: Negative for shortness of breath.   Cardiovascular: Negative for chest pain.  Musculoskeletal: Negative for back pain.  Skin: Positive for wound.  Neurological: Positive for dizziness and  headaches. Negative for syncope.  All other systems reviewed and are negative.     Allergies  Morphine and related and Oxycodone  Home Medications   Prior to Admission medications   Medication Sig Start Date End Date Taking? Authorizing Provider  acetaminophen (TYLENOL) 650 MG CR tablet Take 1,300 mg by mouth daily as needed (joint pain).    Yes Historical Provider, MD  apixaban (ELIQUIS) 5 MG TABS tablet Take 5 mg by mouth 2 (two) times daily.   Yes Historical Provider, MD  Calcium Carbonate-Vitamin D 600-200 MG-UNIT TABS Take by mouth 2 (two) times daily. Take 1 tablet daily for vitamin supplement.    Yes Historical Provider, MD  furosemide (LASIX) 20 MG tablet Take 10 mg by mouth daily.   Yes Historical Provider, MD  Glucosamine-Chondroit-Vit C-Mn (GLUCOSAMINE 1500 COMPLEX) CAPS Take one tablet once a day 01/12/13  Yes Mahima Pandey, MD  glucose blood test strip True Test Glucose Test Strips Check blood sugar once daily Dx: E11.9 05/01/14  Yes Tiffany L Reed, DO  glucose monitoring kit (FREESTYLE) monitoring kit 1 each by Does not apply route as needed for other. Take blood sugar once daily. 01/19/13  Yes Mahima Pandey, MD  lisinopril (PRINIVIL,ZESTRIL) 20 MG tablet Take 20 mg by mouth daily.   Yes Historical Provider, MD  metFORMIN (GLUCOPHAGE) 500 MG tablet Take one tablet of 500 mg every other day for your diabetes 10/11/13  Yes Mahima Bubba Camp, MD  metoprolol tartrate (LOPRESSOR) 25 MG tablet Take 25 mg by mouth 2 (two) times daily.   Yes Historical Provider, MD  Multiple Vitamin (MULITIVITAMIN WITH MINERALS) TABS Take 1 tablet by mouth daily. Take 1 tablet daily for vitamin supplement.   Yes Historical Provider, MD  Omega-3 Fatty Acids (FISH OIL) 1000 MG CAPS Take 1,000 mg by mouth once.   Yes Historical Provider, MD  simvastatin (ZOCOR) 10 MG tablet Take 10 mg by mouth at bedtime.   Yes Historical Provider, MD  TRUEPLUS LANCETS 30G MISC Use to check blood sugar once daily. Dx E11.9 05/01/14  Yes Tiffany L Reed, DO  zoster vaccine live, PF, (ZOSTAVAX) 62376 UNT/0.65ML injection Inject 19,400 Units into the skin once. 04/12/14  Yes Mahima Pandey, MD  ELIQUIS 5 MG TABS tablet TAKE 1 TABLET TWICE DAILY. 03/06/14   Blanchie Serve, MD  furosemide (LASIX) 20 MG tablet Take half a tablet of 20 mg = 10 mg once a day for edema, Hypertension 10/11/13   Blanchie Serve, MD  lisinopril (PRINIVIL,ZESTRIL) 20 MG tablet TAKE 1 TABLET DAILY FOR BLOOD PRESSURE. 01/17/14   Blanchie Serve, MD  metoprolol tartrate (LOPRESSOR) 25 MG tablet TAKE 1 TABLET TWICE DAILY. 04/19/14   Minus Breeding, MD  simvastatin (ZOCOR) 10 MG  tablet TAKE ONE TABLET AT BEDTIME. 01/17/14   Mahima Pandey, MD   BP 158/85 mmHg  Pulse 125  Temp(Src) 98.4 F (36.9 C) (Oral)  Resp 15  SpO2 98% Physical Exam  Constitutional: He is oriented to person, place, and time. He appears well-developed and well-nourished. No distress.  HENT:  Head: Normocephalic. Head is with contusion (nasal) and with laceration.    Right Ear: Hearing normal.  Left Ear: Hearing normal.  Nose: Sinus tenderness present. No nasal septal hematoma. No epistaxis.    Mouth/Throat: Oropharynx is clear and moist and mucous membranes are normal.  Eyes: Conjunctivae and EOM are normal. Pupils are equal, round, and reactive to light.  Neck: Normal range of motion. Neck supple.  Cardiovascular: Regular rhythm, S1 normal and  S2 normal.  Exam reveals no gallop and no friction rub.   No murmur heard. Pulmonary/Chest: Effort normal and breath sounds normal. No respiratory distress. He exhibits no tenderness.  Abdominal: Soft. Normal appearance and bowel sounds are normal. There is no hepatosplenomegaly. There is no tenderness. There is no rebound, no guarding, no tenderness at McBurney's point and negative Murphy's sign. No hernia.  Musculoskeletal: Normal range of motion.       Left wrist: Normal.       Right hip: Normal.       Left hip: Normal.       Left knee: He exhibits normal range of motion, no swelling, no effusion and no ecchymosis. Tenderness found.       Left ankle: Normal.       Left hand: He exhibits laceration. He exhibits normal range of motion and normal capillary refill.       Hands:      Legs: Neurological: He is alert and oriented to person, place, and time. He has normal strength. No cranial nerve deficit or sensory deficit. Coordination normal. GCS eye subscore is 4. GCS verbal subscore is 5. GCS motor subscore is 6.  Skin: Skin is warm and dry. Laceration (left hand, chin) noted. No rash noted. No cyanosis.  Psychiatric: He has a normal mood and  affect. His speech is normal and behavior is normal. Thought content normal.    ED Course  Procedures (including critical care time)  LACERATION REPAIR #1 Performed by: Orpah Greek. Authorized by: Orpah Greek Consent: Verbal consent obtained. Risks and benefits: risks, benefits and alternatives were discussed Consent given by: patient Patient identity confirmed: provided demographic data Prepped and Draped in normal sterile fashion Wound explored  Laceration Location: chin Laceration Length: 2.5cm No Foreign Bodies seen or palpated Anesthesia: local infiltration Local anesthetic: lidocaine 2% without epinephrine Anesthetic total: 3 ml Irrigation method: syringe Amount of cleaning: standard Skin closure: sutures Number of sutures: 6 5-0 Ethilon simple interrupted in skin, 1 deep 4-0 vicryl in to stop bleeding in a vessle Technique: while extensively irrigating the wound there was a small vessel that was oozing blood. I was unable to achieve hemostasis by pressure on the area, therefore Vicryl suture was placed deep in the wound and the bleeding was controlled.  Patient tolerance: Patient tolerated the procedure well with no immediate complications.  LACERATION REPAIR #2 Performed by: Orpah Greek. Authorized by: Orpah Greek Consent: Verbal consent obtained. Risks and benefits: risks, benefits and alternatives were discussed Consent given by: patient Patient identity confirmed: provided demographic data Prepped and Draped in normal sterile fashion Wound explored  Laceration Location: hand Laceration Length: 3.5cm No Foreign Bodies seen or palpated Anesthesia: local infiltration Local anesthetic: lidocaine 2% without epinephrine Anesthetic total: 3 ml Irrigation method: syringe Amount of cleaning: standard Skin closure: sutures Number of sutures: 4 5-0 Ethilon simple interrupted in skin Technique:   Patient tolerance:  Patient tolerated the procedure well with no immediate complications.   Labs Review Labs Reviewed  CBC WITH DIFFERENTIAL - Abnormal; Notable for the following:    WBC 10.7 (*)    RDW 16.1 (*)    Neutrophils Relative % 92 (*)    Neutro Abs 9.9 (*)    Lymphocytes Relative 6 (*)    Lymphs Abs 0.6 (*)    Monocytes Relative 2 (*)    All other components within normal limits  COMPREHENSIVE METABOLIC PANEL - Abnormal; Notable for the following:  Sodium 133 (*)    Chloride 95 (*)    Glucose, Bld 179 (*)    BUN 36 (*)    GFR calc non Af Amer 55 (*)    GFR calc Af Amer 64 (*)    All other components within normal limits  PRO B NATRIURETIC PEPTIDE - Abnormal; Notable for the following:    Pro B Natriuretic peptide (BNP) 1647.0 (*)    All other components within normal limits  URINALYSIS, ROUTINE W REFLEX MICROSCOPIC - Abnormal; Notable for the following:    Hgb urine dipstick SMALL (*)    All other components within normal limits  TROPONIN I  URINE MICROSCOPIC-ADD ON    Imaging Review Ct Head Wo Contrast  05/18/2014   CLINICAL DATA:  Fall.  EXAM: CT HEAD WITHOUT CONTRAST  CT MAXILLOFACIAL WITHOUT CONTRAST  CT CERVICAL SPINE WITHOUT CONTRAST  TECHNIQUE: Multidetector CT imaging of the head, cervical spine, and maxillofacial structures were performed using the standard protocol without intravenous contrast. Multiplanar CT image reconstructions of the cervical spine and maxillofacial structures were also generated.  COMPARISON:  Head and cervical spine CT 07/13/2011  FINDINGS: CT HEAD FINDINGS  Skull and Sinuses:Negative for calvarial fracture.  Orbits: No traumatic findings.  Brain: There is a chronic CSF density extra-axial fluid collection around the left frontal lobe with mild mass effect.  No acute intracranial hemorrhage, acute infarct, hydrocephalus, mass lesion, or midline shift. There is generalized brain atrophy which has progressed from 2012. Low-attenuation in the deep cerebral  white matter consistent with chronic small vessel disease.  CT MAXILLOFACIAL FINDINGS  Soft tissue swelling over the nose, especially on the left, and around the left cheek. There is subcutaneous gas at the anterior margin of the cartilaginous nasal septum. The septum is midline. There is no bony fracture identified. Status post bilateral cataract resection. No evidence of globe injury or postseptal hemorrhage. Incidental torus mandibularis and palatini. No sinus effusion.  CT CERVICAL SPINE FINDINGS  No evidence of acute fracture or traumatic malalignment. There is C3-4 retrolisthesis which is chronic and unchanged from 2012. No gross cervical canal hematoma or prevertebral swelling.  Diffuse degenerative disc narrowing without significant spurring.  Large right lobe thyroid nodule, at least 4 cm in AP dimension, stable from 2012. Interstitial coarsening in the apical lungs is likely chronic lung scarring. There are also small emphysematous bases.  IMPRESSION: 1. No acute intracranial findings or cervical spine fracture. 2. Soft tissue/ cartilaginous injury of the nose. No facial bone fracture. 3. Chronic left frontal hygroma with minimal mass effect on the frontal lobe.   Electronically Signed   By: Jorje Guild M.D.   On: 05/18/2014 19:54   Ct Cervical Spine Wo Contrast  05/18/2014   CLINICAL DATA:  Fall.  EXAM: CT HEAD WITHOUT CONTRAST  CT MAXILLOFACIAL WITHOUT CONTRAST  CT CERVICAL SPINE WITHOUT CONTRAST  TECHNIQUE: Multidetector CT imaging of the head, cervical spine, and maxillofacial structures were performed using the standard protocol without intravenous contrast. Multiplanar CT image reconstructions of the cervical spine and maxillofacial structures were also generated.  COMPARISON:  Head and cervical spine CT 07/13/2011  FINDINGS: CT HEAD FINDINGS  Skull and Sinuses:Negative for calvarial fracture.  Orbits: No traumatic findings.  Brain: There is a chronic CSF density extra-axial fluid collection  around the left frontal lobe with mild mass effect.  No acute intracranial hemorrhage, acute infarct, hydrocephalus, mass lesion, or midline shift. There is generalized brain atrophy which has progressed from 2012. Low-attenuation in the  deep cerebral white matter consistent with chronic small vessel disease.  CT MAXILLOFACIAL FINDINGS  Soft tissue swelling over the nose, especially on the left, and around the left cheek. There is subcutaneous gas at the anterior margin of the cartilaginous nasal septum. The septum is midline. There is no bony fracture identified. Status post bilateral cataract resection. No evidence of globe injury or postseptal hemorrhage. Incidental torus mandibularis and palatini. No sinus effusion.  CT CERVICAL SPINE FINDINGS  No evidence of acute fracture or traumatic malalignment. There is C3-4 retrolisthesis which is chronic and unchanged from 2012. No gross cervical canal hematoma or prevertebral swelling.  Diffuse degenerative disc narrowing without significant spurring.  Large right lobe thyroid nodule, at least 4 cm in AP dimension, stable from 2012. Interstitial coarsening in the apical lungs is likely chronic lung scarring. There are also small emphysematous bases.  IMPRESSION: 1. No acute intracranial findings or cervical spine fracture. 2. Soft tissue/ cartilaginous injury of the nose. No facial bone fracture. 3. Chronic left frontal hygroma with minimal mass effect on the frontal lobe.   Electronically Signed   By: Jorje Guild M.D.   On: 05/18/2014 19:54   Dg Knee Complete 4 Views Left  05/18/2014   CLINICAL DATA:  Pt was on his daily walk when he fell forward and hit his face. Left anterior knee pain.Pt states he had an injection in his left knee today and that he gets them every month due to chronic pain.  EXAM: LEFT KNEE - COMPLETE 4+ VIEW  COMPARISON:  09/11/2012  FINDINGS: Three compartment osteoarthritis again identified. Moderate to severe. Joint space narrowing with  subchondral sclerosis and especially lateral osteophytes. No acute fracture or dislocation. Vascular calcifications. No joint effusion.  IMPRESSION: Degenerative change, without acute osseous finding.   Electronically Signed   By: Abigail Miyamoto M.D.   On: 05/18/2014 19:14   Dg Hand Complete Left  05/18/2014   CLINICAL DATA:  Laceration to the left ring and pinky finger. Left hand pain.  EXAM: LEFT HAND - COMPLETE 3+ VIEW  COMPARISON:  None.  FINDINGS: There is a diffuse osteopenia. Joint space narrowing in the DIP and PIP joints. Degenerative changes at the first carpometacarpal joint. Narrowing at the radiocarpal joint. Negative for a fracture or dislocation.  IMPRESSION: No acute bone abnormality in the left hand.  Diffuse osteoarthritis with osteopenia.   Electronically Signed   By: Markus Daft M.D.   On: 05/18/2014 19:12   Ct Maxillofacial Wo Cm  05/18/2014   CLINICAL DATA:  Fall.  EXAM: CT HEAD WITHOUT CONTRAST  CT MAXILLOFACIAL WITHOUT CONTRAST  CT CERVICAL SPINE WITHOUT CONTRAST  TECHNIQUE: Multidetector CT imaging of the head, cervical spine, and maxillofacial structures were performed using the standard protocol without intravenous contrast. Multiplanar CT image reconstructions of the cervical spine and maxillofacial structures were also generated.  COMPARISON:  Head and cervical spine CT 07/13/2011  FINDINGS: CT HEAD FINDINGS  Skull and Sinuses:Negative for calvarial fracture.  Orbits: No traumatic findings.  Brain: There is a chronic CSF density extra-axial fluid collection around the left frontal lobe with mild mass effect.  No acute intracranial hemorrhage, acute infarct, hydrocephalus, mass lesion, or midline shift. There is generalized brain atrophy which has progressed from 2012. Low-attenuation in the deep cerebral white matter consistent with chronic small vessel disease.  CT MAXILLOFACIAL FINDINGS  Soft tissue swelling over the nose, especially on the left, and around the left cheek. There is  subcutaneous gas at the anterior margin  of the cartilaginous nasal septum. The septum is midline. There is no bony fracture identified. Status post bilateral cataract resection. No evidence of globe injury or postseptal hemorrhage. Incidental torus mandibularis and palatini. No sinus effusion.  CT CERVICAL SPINE FINDINGS  No evidence of acute fracture or traumatic malalignment. There is C3-4 retrolisthesis which is chronic and unchanged from 2012. No gross cervical canal hematoma or prevertebral swelling.  Diffuse degenerative disc narrowing without significant spurring.  Large right lobe thyroid nodule, at least 4 cm in AP dimension, stable from 2012. Interstitial coarsening in the apical lungs is likely chronic lung scarring. There are also small emphysematous bases.  IMPRESSION: 1. No acute intracranial findings or cervical spine fracture. 2. Soft tissue/ cartilaginous injury of the nose. No facial bone fracture. 3. Chronic left frontal hygroma with minimal mass effect on the frontal lobe.   Electronically Signed   By: Jorje Guild M.D.   On: 05/18/2014 19:54     EKG Interpretation   Date/Time:  Thursday May 18 2014 18:24:09 EST Ventricular Rate:  141 PR Interval:    QRS Duration: 90 QT Interval:  340 QTC Calculation: 521 R Axis:   34 Text Interpretation:  Atrial fibrillation with rapid V-rate ST depression,  probably rate related Confirmed by POLLINA  MD, CHRISTOPHER [phone]) on  05/18/2014 6:35:39 PM      MDM   Final diagnoses:  Fall  Facial contusion, initial encounter  Laceration  Atrial fibrillation with rapid ventricular response    Patient presents to the ER for evaluation after a fall. Patient was walking on the street and reports that he lost his balance, falling forward.she suffered a laceration of the chin and had significant contusion of the nose. No septal hematoma was noted. CT scan of head, maxillofacial bones, cervical spine was negative for any intracranial  injury or fractures. Patient required sutures in his chin as well as left hand. Patient's only other complaints were pain in the left hand and left knee, these x-rays were negative.  Patient was found to be in atrial fibrillation with rapid ventricular response upon arrival. Patient does have a history of atrial fibrillation, is on Eliquis chronically for this. The Eliquis caused significant bruising and contusions to form during his stay here in the ER.  Patient takes Lopressor for rate control. He was given additional Lopressor here in the ER, 5 mg IV 3 with only partial improvement. Heart rate down in the 110 range now. Patient was very hypertensive on arrival, pressure has improved as well. As he has not, however, had complete resolution of his rapid ventricular response, will last for admission to the hospital for further management. Additionally, with his significant bruising secondary to chronic blood thinner use, observation for bleeding is reasonable.  Patient will require suture removal in 7-10 days.  Orpah Greek, MD 05/18/14 [phone]

## 2014-05-18 NOTE — ED Notes (Signed)
Pt does not have to urinate at this time. Has a urinal at the bedside.

## 2014-05-18 NOTE — ED Notes (Signed)
Patient on daily walk when he fell--patient fell prone, hitting face Laceration noted to chin, swelling and bruising to nose No LOC Patient is on blood thinners Patient with hx of A-fib Patient alert and oriented x 4 PERRLA

## 2014-05-19 ENCOUNTER — Inpatient Hospital Stay (HOSPITAL_COMMUNITY): Payer: Medicare Other

## 2014-05-19 ENCOUNTER — Other Ambulatory Visit: Payer: Self-pay | Admitting: Internal Medicine

## 2014-05-19 ENCOUNTER — Other Ambulatory Visit: Payer: Self-pay | Admitting: Cardiology

## 2014-05-19 ENCOUNTER — Encounter: Payer: Self-pay | Admitting: Internal Medicine

## 2014-05-19 ENCOUNTER — Encounter (HOSPITAL_COMMUNITY): Payer: Self-pay

## 2014-05-19 ENCOUNTER — Other Ambulatory Visit: Payer: Self-pay

## 2014-05-19 DIAGNOSIS — D181 Lymphangioma, any site: Secondary | ICD-10-CM | POA: Diagnosis present

## 2014-05-19 LAB — CBC
HCT: 39 % (ref 39.0–52.0)
HEMOGLOBIN: 12.5 g/dL — AB (ref 13.0–17.0)
MCH: 27 pg (ref 26.0–34.0)
MCHC: 32.1 g/dL (ref 30.0–36.0)
MCV: 84.2 fL (ref 78.0–100.0)
PLATELETS: 208 10*3/uL (ref 150–400)
RBC: 4.63 MIL/uL (ref 4.22–5.81)
RDW: 16.1 % — ABNORMAL HIGH (ref 11.5–15.5)
WBC: 8.4 10*3/uL (ref 4.0–10.5)

## 2014-05-19 LAB — BASIC METABOLIC PANEL
Anion gap: 14 (ref 5–15)
BUN: 37 mg/dL — AB (ref 6–23)
CHLORIDE: 100 meq/L (ref 96–112)
CO2: 23 mEq/L (ref 19–32)
Calcium: 10 mg/dL (ref 8.4–10.5)
Creatinine, Ser: 1.02 mg/dL (ref 0.50–1.35)
GFR calc Af Amer: 72 mL/min — ABNORMAL LOW (ref >90)
GFR calc non Af Amer: 62 mL/min — ABNORMAL LOW (ref >90)
GLUCOSE: 257 mg/dL — AB (ref 70–99)
Potassium: 4.7 mEq/L (ref 3.7–5.3)
SODIUM: 137 meq/L (ref 137–147)

## 2014-05-19 LAB — TROPONIN I
Troponin I: <0.3 ng/mL (ref <0.30)
Troponin I: <0.3 ng/mL (ref <0.30)
Troponin I: <0.3 ng/mL (ref <0.30)

## 2014-05-19 LAB — GLUCOSE, CAPILLARY: GLUCOSE-CAPILLARY: 194 mg/dL — AB (ref 70–99)

## 2014-05-19 MED ORDER — DILTIAZEM HCL 30 MG PO TABS
30.0000 mg | ORAL_TABLET | Freq: Two times a day (BID) | ORAL | Status: DC
  Administered 2014-05-19 (×2): 30 mg via ORAL
  Filled 2014-05-19 (×2): qty 1

## 2014-05-19 MED ORDER — METOPROLOL TARTRATE 25 MG PO TABS: 50.0000 mg | ORAL_TABLET | Freq: Two times a day (BID) | ORAL | Status: DC
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
## 2014-05-19 NOTE — Care Management Note (Addendum)
    Page 1 of 1   05/19/2014     5:05:56 PM CARE MANAGEMENT NOTE 05/19/2014  Patient:  Logan Bean,Logan Bean   Account Number:  [id number]  Date Initiated:  05/19/2014  Documentation initiated by:  Dessa Phi  Subjective/Objective Assessment:   80 Y/O M ADMITTED W/AFIB,FALL.LA:GTXM.     Action/Plan:   FROM HOME.HAS FAMILY SUPPORT.HAS CANE,PCP,PHARMACY.   Anticipated DC Date:  05/19/2014   Anticipated DC Plan:  [place]  CM consult  Patient refused services      Choice offered to / List presented to:             Status of service:  Completed, signed off Medicare Important Message given?   (If response is "NO", the following Medicare IM given date fields will be blank) Date Medicare IM given:   Medicare IM given by:   Date Additional Medicare IM given:   Additional Medicare IM given by:    Discharge Disposition:  HOME/SELF CARE  Per UR Regulation:  Reviewed for med. necessity/level of care/duration of stay  If discussed at [place] of Stay Meetings, dates discussed:    Comments:  05/19/14 Endora Teresi RN,BNS NCM [phone] PT-NO F/U.PATIENT HAS FAMILY SUPPORT.TELE MONITOR @ HOME.MD WILL CANCEL HHC ORDERS.NO FURTHER D/C NEEDS. PT/OT  ORDERED, AWAIT RECOMMENDATIONS.

## 2014-05-19 NOTE — Progress Notes (Signed)
PT Cancellation Note  Patient Details Name: Maurie Olesen MRN: [id number] DOB: 01/14/1921   Cancelled Treatment:    Reason Eval/Treat Not Completed: Patient at procedure or test/unavailable 2nd attempt for PT eval. will have to check back another time-later today. thanks.    Weston Anna, MPT Pager: [phone]

## 2014-05-19 NOTE — Evaluation (Signed)
Physical Therapy Evaluation Patient Details Name: Logan Bean MRN: [id number] DOB: 05/28/1921 Today's Date: 05/19/2014   History of Present Illness  80 yo male admitted with fall, skin lacerations, A fib with RVR. Hx of HTN, DM, hiatal hernia, dizziness.   Clinical Impression  3rd attempt for PT eval today. On eval, pt was Min guard assist for mobility-able to ambulate ~250 feet without assistive device. Pt normally uses walking stick-especially when outside. Overall pt mobilized well. No overt LOB with balance tasks. Recommend use of walking stick and for pt to avoid down-sloped hill for a while. Pt states he does not have to walk down that hill. Family present-state they can check on pt. Do not feel pt has any follow up PT needs at this time. Made RN aware.     Follow Up Recommendations No PT follow up; Intermittent supervision    Equipment Recommendations  None recommended by PT    Recommendations for Other Services       Precautions / Restrictions Precautions Precautions: Fall Restrictions Weight Bearing Restrictions: No      Mobility  Bed Mobility               General bed mobility comments: pt sitting at EOB  Transfers Overall transfer level: Modified independent                  Ambulation/Gait Ambulation/Gait assistance: Min guard Ambulation Distance (Feet): 250 Feet Assistive device: None Gait Pattern/deviations: Step-through pattern;Drifts right/left (external rotation bil feet)     General Gait Details: good gait speed. Unsteady at times but no overt LOB. Pt normally uses walking stick but did well without assistive device.   Stairs            Wheelchair Mobility    Modified Rankin (Stroke Patients Only)       Balance Overall balance assessment: History of Falls Sitting-balance support: No upper extremity supported Sitting balance-Leahy Scale: Normal     Standing balance support: No upper extremity supported;During functional  activity Standing balance-Leahy Scale: Good Standing balance comment: EO/EC, static standing with external perturbations, 360 degree turn L/R, P/u object-all with close guard assist. Increased difficulty/time with picking up object but no LOB.              High level balance activites: Backward walking;Direction changes;Turns;Sudden stops;Head turns High Level Balance Comments: Performed all tasks with close guard assist. No LOB.              Pertinent Vitals/Pain Pain Assessment: 0-10 Pain Score: 5  Pain Location: L side/ribs Pain Descriptors / Indicators: Sore    Home Living Family/patient expects to be discharged to:: Private residence Living Arrangements: Children Available Help at Discharge: Family Type of Home: House Home Access: Stairs to enter   Technical brewer of Steps: 1 Home Layout: One level Home Equipment:  (walking stick)      Prior Function Level of Independence: Independent with assistive device(s)         Comments: uses walking stick outside     Hand Dominance        Extremity/Trunk Assessment   Upper Extremity Assessment: Defer to OT evaluation           Lower Extremity Assessment: Generalized weakness      Cervical / Trunk Assessment: Kyphotic  Communication   Communication: No difficulties  Cognition Arousal/Alertness: Awake/alert Behavior During Therapy: WFL for tasks assessed/performed Overall Cognitive Status: Within Functional Limits for tasks assessed  General Comments      Exercises        Assessment/Plan    PT Assessment Patent does not need any further PT services  PT Diagnosis     PT Problem List    PT Treatment Interventions     PT Goals (Current goals can be found in the Care Plan section) Acute Rehab PT Goals Patient Stated Goal: home PT Goal Formulation: All assessment and education complete, DC therapy    Frequency     Barriers to discharge         Co-evaluation               End of Session Equipment Utilized During Treatment: Gait belt Activity Tolerance: Patient tolerated treatment well Patient left: in bed;with call bell/phone within reach           Time: 5790-3833 PT Time Calculation (min): 12 min   Charges:   PT Evaluation $Initial PT Evaluation Tier I: 1 Procedure PT Treatments $Gait Training: 8-22 mins   PT G Codes:          Weston Anna, MPT Pager: [phone]

## 2014-05-19 NOTE — Plan of Care (Signed)
Problem: Consults Goal: General Medical Patient Education See Patient Education Module for specific education.  Outcome: Completed/Met Date Met:  05/19/14 Goal: Skin Care Protocol Initiated - if Braden Score 18 or less If consults are not indicated, leave blank or document N/A  Outcome: Completed/Met Date Met:  05/19/14 Goal: Nutrition Consult-if indicated Outcome: Not Applicable Date Met:  44/62/86 Goal: Diabetes Guidelines if Diabetic/Glucose > 140 If diabetic or lab glucose is > 140 mg/dl - Initiate Diabetes/Hyperglycemia Guidelines & Document Interventions  Outcome: Not Applicable Date Met:  38/17/71  Problem: Phase I Progression Outcomes Goal: OOB as tolerated unless otherwise ordered Outcome: Completed/Met Date Met:  05/19/14 Goal: Initial discharge plan identified Outcome: Completed/Met Date Met:  05/19/14 Goal: Hemodynamically stable Outcome: Completed/Met Date Met:  05/19/14 Goal: Other Phase I Outcomes/Goals Outcome: Completed/Met Date Met:  05/19/14  Problem: Phase II Progression Outcomes Goal: Progress activity as tolerated unless otherwise ordered Outcome: Completed/Met Date Met:  05/19/14 Goal: Discharge plan established Outcome: Completed/Met Date Met:  05/19/14 Goal: Vital signs remain stable Outcome: Completed/Met Date Met:  05/19/14 Goal: IV changed to normal saline lock Outcome: Completed/Met Date Met:  05/19/14 Goal: Obtain order to discontinue catheter if appropriate Outcome: Not Applicable Date Met:  16/57/90 Goal: Other Phase II Outcomes/Goals Outcome: Completed/Met Date Met:  05/19/14  Problem: Phase III Progression Outcomes Goal: Pain controlled on oral analgesia Outcome: Completed/Met Date Met:  05/19/14 Goal: Activity at appropriate level-compared to baseline (UP IN CHAIR FOR HEMODIALYSIS)  Outcome: Completed/Met Date Met:  05/19/14 Goal: Voiding independently Outcome: Completed/Met Date Met:  05/19/14 Goal: IV/normal saline lock  discontinued Outcome: Completed/Met Date Met:  05/19/14 Goal: Foley discontinued Outcome: Not Applicable Date Met:  38/33/38 Goal: Discharge plan remains appropriate-arrangements made Outcome: Completed/Met Date Met:  05/19/14 Goal: Other Phase III Outcomes/Goals Outcome: Completed/Met Date Met:  05/19/14

## 2014-05-19 NOTE — Discharge Instructions (Signed)

## 2014-05-19 NOTE — Progress Notes (Signed)
PHARMACIST - PHYSICIAN ORDER COMMUNICATION  CONCERNING: P&T Medication Policy on Herbal Medications  DESCRIPTION:  This patient's order for:  Glucosamine  has been noted.  This product(s) is classified as an "herbal" or natural product. Due to a lack of definitive safety studies or FDA approval, nonstandard manufacturing practices, plus the potential risk of unknown drug-drug interactions while on inpatient medications, the Pharmacy and Therapeutics Committee does not permit the use of "herbal" or natural products of this type within [facility].   ACTION TAKEN: The pharmacy department is unable to verify this order at this time and your patient has been informed of this safety policy. Please reevaluate patient's clinical condition at discharge and address if the herbal or natural product(s) should be resumed at that time.   Dorrene German 05/19/2014 12:40 AM

## 2014-05-19 NOTE — Plan of Care (Signed)
Problem: Phase I Progression Outcomes Goal: Pain controlled with appropriate interventions Outcome: Completed/Met Date Met:  05/19/14 Goal: Voiding-avoid urinary catheter unless indicated Outcome: Completed/Met Date Met:  05/19/14     

## 2014-05-19 NOTE — Progress Notes (Signed)
PT Cancellation Note  Patient Details Name: Logan Bean MRN: [id number] DOB: December 15, 1920   Cancelled Treatment:    Reason Eval/Treat Not Completed: Order received. MD in room and pt currently eating lunch.  PT will try to attempt later as schedule permits. OT will see as time allows.    Logan Bean 05/19/2014, 12:29 PM  Pauline Aus OTR/L [phone] 05/19/2014

## 2014-05-22 NOTE — Telephone Encounter (Signed)
Rx refill sent to patient pharmacy   

## 2014-05-23 ENCOUNTER — Telehealth: Payer: Self-pay

## 2014-05-23 NOTE — Telephone Encounter (Signed)
Please make hospital f/u appt in 1-2 weeks       ----- Message -----    From: Bonnielee Haff, MD    Sent: 05/19/2014  4:34 PM     To: Blanchie Serve, MD   Spoke with patient, patient states he will check with his children and call back to schedule appointment. Patient was given the dates 11/17,11/24,11/25 to present to his children as available times to schedule hospital follow-up

## 2014-05-24 ENCOUNTER — Encounter: Payer: Self-pay | Admitting: Internal Medicine

## 2014-05-30 ENCOUNTER — Ambulatory Visit (INDEPENDENT_AMBULATORY_CARE_PROVIDER_SITE_OTHER): Payer: Medicare Other | Admitting: Internal Medicine

## 2014-05-30 ENCOUNTER — Encounter: Payer: Self-pay | Admitting: Internal Medicine

## 2014-05-30 VITALS — BP 122/78 | HR 60 | Temp 97.8°F | Resp 10 | Ht 65.0 in | Wt 151.0 lb

## 2014-05-30 DIAGNOSIS — I482 Chronic atrial fibrillation, unspecified: Secondary | ICD-10-CM

## 2014-05-30 DIAGNOSIS — S61412D Laceration without foreign body of left hand, subsequent encounter: Secondary | ICD-10-CM | POA: Diagnosis not present

## 2014-05-30 DIAGNOSIS — R58 Hemorrhage, not elsewhere classified: Secondary | ICD-10-CM

## 2014-05-30 DIAGNOSIS — S0181XD Laceration without foreign body of other part of head, subsequent encounter: Secondary | ICD-10-CM | POA: Diagnosis not present

## 2014-05-30 NOTE — Progress Notes (Signed)
Patient ID: Logan Bean, male   DOB: 09/14/1920, 80 y.o.   MRN: [id number]    Chief Complaint  Patient presents with  . Hospitalization Follow-up    Seen in hospital on 05/18/2014 (Facial Contusion), examine stitches on chin and left hand   Allergies  Allergen Reactions  . Morphine And Related Nausea And Vomiting  . Oxycodone Nausea And Vomiting   HPI 80 y/o male patient is here for hospital follow up. He was in the hospital from 05/18/14-05/19/14 post mechanical fall with afib with RVR and skin lacerations. He had sutures placed on his chin and left hand. His metoprolol dosing was increased in hospital to 50 bid but appears he was discharged home back on 25 mg bid. He is currently taking the med. His eliquis was continued.  He has PMH of afib, dm, HTN, CHF, CKD among others He is seen in his room. Denies any further palpitations or chest pain Has some dyspnea with exertion Denies uri symptoms Has pain in his left hand while exerting pressure during change of position He mentions that he tried cleaning the chin area but feels something is stuck in his chin and has difficulty cleaning it. Also mentions noticing some bleeding in the chin area  ROS Denies blood loss elsewhere No bowel or bladder complaints Appetite is fair Denies nausea or vomiting Denies any new fall post discharge from hospital  Past Medical History  Diagnosis Date  . Hypertension   . Hypercholesteremia   . Stomach ulcer     years  . H/O hiatal hernia   . Varicose veins     both legs  . Arthritis     left knee  . Diabetes mellitus     diet controlled, pt does not check cbg  . Left rotator cuff tear Jul 12, 2012  . Edema   . Unspecified hereditary and idiopathic peripheral neuropathy   . Dizziness and giddiness   . Unspecified vitamin D deficiency   . Diaphragmatic hernia without mention of obstruction or gangrene    Medication reviewed. See [facility]  Physical exam BP 122/78 mmHg  Pulse 60  Temp(Src)  97.8 F (36.6 C) (Oral)  Resp 10  Ht 5\' 5"  (1.651 m)  Wt 151 lb (68.493 kg)  BMI 25.13 kg/m2  SpO2 94%  General: alert, in no distress, has multiple bruises on his face Face: 2 visible suture in chin area with minimal bleed present the dressing had some blood in it, removed and tried to clean the mass/ clot on the chin, unable to remove it. Resp: CTAB Cardio: regular rate and rhythm, S1, S2 normal, no murmur GI: soft, non-tender; bowel sounds normal Extremities: mild bruising over knees. Uses a cane, has  4 sutures in left hand between the 4th and 5th finger in the interdigital space extending to the palm Neurologic: Alert and oriented x 3   Assessment/plan  1. Blood loss From laceration. Has some bleed in the chin area. Check cbc to assess for hb drop with him on eliquis - CBC (no diff)  2. Chin laceration, subsequent encounter Has sutures in place, difficult to assess the healing with ? Heamtoma/old blood clot around it. On exam some gaping still present with minimal bleed. Will refer to general surgery to help in stitch removal - Ambulatory referral to General Surgery  3. Chronic atrial fibrillation Rate currently controlled with normal bp. Continue metoprolol 25 mg bid for now (pt never took 50 mg bid at home since discharge and rate  currently controlled on 25 bid). On eliquis. Has CHADS-2 of 4 and is a high fall risk patient. Will consult with cardiology Dr Jenkins Rouge on discontinuing vs continuation of the anticoagulation given his risk factors.  4. Hand laceration, left, subsequent encounter Sutures in place but some gaping present at the interdigital fold. Will not remove it this visit. Reassess in 2 weeks for suture removal

## 2014-05-31 ENCOUNTER — Telehealth: Payer: Self-pay | Admitting: *Deleted

## 2014-05-31 LAB — CBC
HCT: 38 % (ref 37.5–51.0)
Hemoglobin: 12.4 g/dL — ABNORMAL LOW (ref 12.6–17.7)
MCH: 27.6 pg (ref 26.6–33.0)
MCHC: 32.6 g/dL (ref 31.5–35.7)
MCV: 85 fL (ref 79–97)
Platelets: 247 10*3/uL (ref 150–379)
RBC: 4.49 x10E6/uL (ref 4.14–5.80)
RDW: 16.1 % — AB (ref 12.3–15.4)
WBC: 9.6 10*3/uL (ref 3.4–10.8)

## 2014-05-31 NOTE — Addendum Note (Signed)
Addended byBlanchie Serve on: 05/31/2014 07:55 AM   Modules accepted: Level of Service

## 2014-05-31 NOTE — Telephone Encounter (Signed)
Dr. Bubba Camp spoke with patient's Cardiologist and they agree that patient should continue his Eliquis. Patient Notified and agreed.

## 2014-06-01 ENCOUNTER — Encounter: Payer: Self-pay | Admitting: *Deleted

## 2014-06-01 ENCOUNTER — Telehealth: Payer: Self-pay | Admitting: *Deleted

## 2014-06-01 NOTE — Telephone Encounter (Signed)
Patient called wanting to check on his referral appointment. I spoke with Earlie Server and it was done just awaiting an appointment time from the [place]. Patient notified of this and patient also wanted you to know that there is some swelling in his chin, not a lot but some. Not painful but sore

## 2014-06-13 ENCOUNTER — Ambulatory Visit (INDEPENDENT_AMBULATORY_CARE_PROVIDER_SITE_OTHER): Payer: Medicare Other | Admitting: Internal Medicine

## 2014-06-13 ENCOUNTER — Encounter: Payer: Self-pay | Admitting: Internal Medicine

## 2014-06-13 VITALS — BP 124/68 | HR 50 | Temp 97.0°F | Resp 12 | Wt 152.0 lb

## 2014-06-13 DIAGNOSIS — Z4802 Encounter for removal of sutures: Secondary | ICD-10-CM

## 2014-06-13 DIAGNOSIS — S0181XS Laceration without foreign body of other part of head, sequela: Secondary | ICD-10-CM

## 2014-06-13 DIAGNOSIS — S61412S Laceration without foreign body of left hand, sequela: Secondary | ICD-10-CM

## 2014-06-13 NOTE — Progress Notes (Signed)
Patient ID: Logan Bean, male   DOB: 1921-02-13, 80 y.o.   MRN: [id number]    Chief Complaint  Patient presents with  . Follow-up    2 week follow-up, never seen wound center   Allergies  Allergen Reactions  . Morphine And Related Nausea And Vomiting  . Oxycodone Nausea And Vomiting   HPI Pt here for suture removal on his chin and hand. Denies any concerns today. Mentions that bleeding on his chin has subsided. Bruise on face has improved.  No further falls  ROS Negative  Physical exam BP 124/68 mmHg  Pulse 50  Temp(Src) 97 F (36.1 C) (Oral)  Resp 12  Wt 152 lb (68.947 kg)  SpO2 93%  General: alert, in no distress, has resolving bruises on his face, small hematoma on left cheek area Face: 4 suture in chin area with some caked blood clot, removed all 4 suture, laceration area healed well Resp: CTAB Cardio: regular rate and rhythm, S1, S2 normal, no murmur Extremities: mild bruising over knees. Uses a cane, has  4 sutures in left hand between the 4th and 5th finger in the interdigital space extending to the palm- all 4 removed Neurologic: Alert and oriented x 3   Assessment/plan  Chin laceration, subsequent encounter Suture removed, tolerated procedure well. Laceration healed well. Can leave it open to dry to air  Hand laceration, left, subsequent encounter Sutures removed, healed well

## 2014-06-19 ENCOUNTER — Other Ambulatory Visit: Payer: Self-pay | Admitting: Internal Medicine

## 2014-06-28 DIAGNOSIS — M1712 Unilateral primary osteoarthritis, left knee: Secondary | ICD-10-CM | POA: Diagnosis not present

## 2014-07-04 ENCOUNTER — Other Ambulatory Visit: Payer: Medicare Other

## 2014-07-05 ENCOUNTER — Other Ambulatory Visit: Payer: Medicare Other

## 2014-07-05 DIAGNOSIS — E0590 Thyrotoxicosis, unspecified without thyrotoxic crisis or storm: Secondary | ICD-10-CM | POA: Diagnosis not present

## 2014-07-05 DIAGNOSIS — M1712 Unilateral primary osteoarthritis, left knee: Secondary | ICD-10-CM | POA: Diagnosis not present

## 2014-07-05 DIAGNOSIS — M25561 Pain in right knee: Secondary | ICD-10-CM | POA: Diagnosis not present

## 2014-07-06 LAB — TSH: TSH: 0.479 u[IU]/mL (ref 0.450–4.500)

## 2014-07-11 ENCOUNTER — Ambulatory Visit (INDEPENDENT_AMBULATORY_CARE_PROVIDER_SITE_OTHER): Payer: Medicare Other | Admitting: Internal Medicine

## 2014-07-11 ENCOUNTER — Encounter: Payer: Self-pay | Admitting: Internal Medicine

## 2014-07-11 VITALS — BP 116/72 | HR 88 | Temp 97.4°F | Resp 10 | Ht 65.0 in | Wt 153.0 lb

## 2014-07-11 DIAGNOSIS — I4891 Unspecified atrial fibrillation: Secondary | ICD-10-CM

## 2014-07-11 DIAGNOSIS — E785 Hyperlipidemia, unspecified: Secondary | ICD-10-CM | POA: Diagnosis not present

## 2014-07-11 DIAGNOSIS — N183 Chronic kidney disease, stage 3 unspecified: Secondary | ICD-10-CM

## 2014-07-11 DIAGNOSIS — E1122 Type 2 diabetes mellitus with diabetic chronic kidney disease: Secondary | ICD-10-CM

## 2014-07-11 DIAGNOSIS — N189 Chronic kidney disease, unspecified: Secondary | ICD-10-CM

## 2014-07-11 DIAGNOSIS — Z23 Encounter for immunization: Secondary | ICD-10-CM

## 2014-07-11 DIAGNOSIS — Z1211 Encounter for screening for malignant neoplasm of colon: Secondary | ICD-10-CM

## 2014-07-11 DIAGNOSIS — I10 Essential (primary) hypertension: Secondary | ICD-10-CM | POA: Diagnosis not present

## 2014-07-11 NOTE — Progress Notes (Signed)
Patient ID: Logan Bean, male   DOB: [birth year], 80 y.o.   MRN: [id number]    Chief Complaint  Patient presents with  . Annual Exam    Yearly check-up, EKG-UTD, labs completed 04/2014 and MMSE completed on 05/16/14 (27/30)  . Immunizations    Prevnar   Allergies  Allergen Reactions  . Morphine And Related Nausea And Vomiting  . Oxycodone Nausea And Vomiting    HPI 80 y/o male pt is here for his annual exam. He denies any concerns this visit. His hands continue to feel slick. He recently had PNCV that confirmed bilateral carpal tunnel syndrome. Stable thyroid function at present. His bp reading has been stable.  Due for prevnar uptodate with influenza and pneumococcal 23  Review of Systems  Constitutional: Negative for fever, chills, malaise/fatigue and diaphoresis.  HENT: Negative for congestion, hearing loss and sore throat.   Eyes: Negative for blurred vision, double vision and discharge.  Respiratory: Negative for cough, sputum production, shortness of breath and wheezing.   Cardiovascular: Negative for chest pain, palpitations, orthopnea and leg swelling.  Gastrointestinal: Negative for heartburn, nausea, vomiting, abdominal pain, diarrhea and constipation.  Genitourinary: Negative for dysuria, urgency, frequency and flank pain. has nocturia Musculoskeletal: Negative for back pain, falls.  Skin: Negative for itching and rash.  Neurological: Negative for dizziness, tingling, focal weakness and headaches.  Psychiatric/Behavioral: Negative for depression and memory loss. The patient is not nervous/anxious.    Past Medical History  Diagnosis Date  . Hypertension   . Hypercholesteremia   . Stomach ulcer     years  . H/O hiatal hernia   . Varicose veins     both legs  . Arthritis     left knee  . Diabetes mellitus     diet controlled, pt does not check cbg  . Left rotator cuff tear Jul 12, 2012  . Edema   . Unspecified hereditary and idiopathic peripheral neuropathy     . Dizziness and giddiness   . Unspecified vitamin D deficiency   . Diaphragmatic hernia without mention of obstruction or gangrene    Past Surgical History  Procedure Laterality Date  . Esopagus strectching  2003 and 3 yrs ago  . Tonsillectomy  age 50  . Shoulder open rotator cuff repair  11/05/2011    Procedure: ROTATOR CUFF REPAIR SHOULDER OPEN;  Surgeon: Tobi Bastos, MD;  Location: [facility];  Service: Orthopedics;  Laterality: Left;  Left Shoulder Rotator Cuff Repair Open with Graft and Anchors  . Reverse shoulder arthroplasty Right 09/17/2012    Procedure: REVERSE SHOULDER ARTHROPLASTY;  Surgeon: Augustin Schooling, MD;  Location: [facility];  Service: Orthopedics;  Laterality: Right;  . Orif ankle fracture Right 09/20/2012    Procedure: OPEN REDUCTION INTERNAL FIXATION (ORIF) ANKLE FRACTURE;  Surgeon: Johnn Hai, MD;  Location: [facility];  Service: Orthopedics;  Laterality: Right;  . Mastectomy      bilateral  . Cardioversion N/A 02/22/2013    Procedure: CARDIOVERSION;  Surgeon: Darlin Coco, MD;  Location: [facility];  Service: Cardiovascular;  Laterality: N/A;   Family History  Problem Relation Age of Onset  . Parkinson's disease Mother   . Hip fracture Mother   . Heart disease Mother   . Alzheimer's disease Mother   . Heart disease Father   . Parkinson's disease Sister   . Heart disease Brother    History   Social History  . Marital Status: Widowed    Spouse Name: N/A  Number of Children: 0  . Years of Education: N/A   Occupational History  . Not on file.   Social History Main Topics  . Smoking status: Former Smoker -- 73 years    Types: Pipe, Cigarettes    Quit date: 07/14/1996  . Smokeless tobacco: Never Used  . Alcohol Use: Yes     Comment: occasional beer  . Drug Use: No  . Sexual Activity: Not on file   Other Topics Concern  . Not on file   Social History Narrative   Lives alone.   Current Outpatient Prescriptions on File Prior to Visit   Medication Sig Dispense Refill  . acetaminophen (TYLENOL) 650 MG CR tablet Take 1,300 mg by mouth daily as needed (joint pain).     . Calcium Carbonate-Vitamin D 600-200 MG-UNIT TABS Take by mouth 2 (two) times daily. Take 1 tablet daily for vitamin supplement.    Marland Kitchen ELIQUIS 5 MG TABS tablet TAKE 1 TABLET TWICE DAILY. 60 tablet 1  . furosemide (LASIX) 20 MG tablet TAKE (1/2) TABLET ONCE A DAY FOR EDEMA, HYPERTENSION. 30 tablet 2  . Glucosamine-Chondroit-Vit C-Mn (GLUCOSAMINE 1500 COMPLEX) CAPS Take one tablet once a day 30 each 0  . glucose blood test strip True Test Glucose Test Strips Check blood sugar once daily Dx: E11.9 50 each 12  . glucose monitoring kit (FREESTYLE) monitoring kit 1 each by Does not apply route as needed for other. Take blood sugar once daily. 1 each 0  . lisinopril (PRINIVIL,ZESTRIL) 20 MG tablet TAKE 1 TABLET DAILY FOR BLOOD PRESSURE. 30 tablet 5  . metFORMIN (GLUCOPHAGE) 500 MG tablet Take one tablet of 500 mg every other day for your diabetes 30 tablet 3  . metoprolol tartrate (LOPRESSOR) 25 MG tablet TAKE 1 TABLET TWICE DAILY. 60 tablet 5  . Multiple Vitamin (MULITIVITAMIN WITH MINERALS) TABS Take 1 tablet by mouth daily. Take 1 tablet daily for vitamin supplement.    . Omega-3 Fatty Acids (FISH OIL) 1000 MG CAPS Take 1,000 mg by mouth once.    . simvastatin (ZOCOR) 10 MG tablet TAKE ONE TABLET AT BEDTIME. 30 tablet 5  . TRUEPLUS LANCETS 30G MISC Use to check blood sugar once daily. Dx E11.9 50 each 12   No current facility-administered medications on file prior to visit.    Physical exam BP 116/72 mmHg  Pulse 88  Temp(Src) 97.4 F (36.3 C) (Oral)  Resp 10  Ht _0  (1.651 m)  Wt 153 lb (69.4 kg)  BMI 25.46 kg/m2  SpO2 97%  Wt Readings from Last 3 Encounters:  07/11/14 153 lb (69.4 kg)  06/13/14 152 lb (68.947 kg)  05/30/14 151 lb (68.493 kg)   General- elderly male in no acute distress Head- atraumatic, normocephalic Eyes- PERRLA, EOMI, no pallor,  no icterus, no discharge Ears- left ear normal tympanic membrane and normal external ear canal , right ear normal tympanic membrane and normal external ear canal Neck- no lymphadenopathy, no thyromegaly, no jugular vein distension, no carotid bruit Nose- normal nasaal mucosa, no maxillary sinus tenderness, no frontal sinus tenderness Mouth- normal mucus membrane, no oral thrush, normal oropharynx Chest- no chest wall deformities, no chest wall tenderness Cardiovascular- irregular heart rate, normal distal pulses Respiratory- bilateral clear to auscultation, no wheeze, no rhonchi, no crackles Abdomen- bowel sounds present, soft, non tender, no CVA tenderness Musculoskeletal- able to move all 4 extremities, using cane, no leg edema Neurological- no focal deficit, normal reflexes, normal muscle strength, normal sensation to fine touch  and vibration Skin- warm and dry skin Psychiatry- alert and oriented to person, place and time, normal mood and affect  Labs CBC Latest Ref Rng 05/30/2014 05/19/2014 05/18/2014  WBC 3.4 - 10.8 x10E3/uL 9.6 8.4 10.7(H)  Hemoglobin 12.6 - 17.7 g/dL 12.4(L) 12.5(L) 14.1  Hematocrit 37.5 - 51.0 % 38.0 39.0 44.3  Platelets 150 - 379 x10E3/uL 247 208 205   CMP Latest Ref Rng 05/19/2014 05/18/2014 05/11/2014  Glucose 70 - 99 mg/dL 257(H) 179(H) 121(H)  BUN 6 - 23 mg/dL 37(H) 36(H) 35  Creatinine 0.50 - 1.35 mg/dL 1.02 1.12 1.13  Sodium 137 - 147 mEq/L 137 133(L) 145(H)  Potassium 3.7 - 5.3 mEq/L 4.7 4.6 4.7  Chloride 96 - 112 mEq/L 100 95(L) 103  CO2 19 - 32 mEq/L _0 Calcium 8.4 - 10.5 mg/dL 10.0 10.5 9.9  Total Protein 6.0 - 8.3 g/dL - 7.3 6.1  Albumin 3.2 - 4.6 g/dL - - 4.1  Total Bilirubin 0.3 - 1.2 mg/dL - 0.9 0.7  Alkaline Phos 39 - 117 U/L - 114 99  AST 0 - 37 U/L - 27 18  ALT 0 - 53 U/L - 20 16   Lipid Panel     Component Value Date/Time   TRIG 120 05/11/2014 1330   HDL 43 05/11/2014 1330   CHOLHDL 3.7 05/11/2014 1330   LDLCALC 90 05/11/2014  1330   Lab Results  Component Value Date   HGBA1C 7.1* 05/11/2014   Lab Results  Component Value Date   TSH 0.479 07/05/2014    Assessment/plan  1. Essential hypertension Controlled. Continue lisinopril 20 mg daily - CMP; Future - CBC with Differential; Future - TSH; Future  2. Atrial fibrillation with RVR Rate controlled. Continue lopressor and eliquis for now - TSH; Future  3. Dyslipidemia Continue zocor 10 mg daily with fish oil - Lipid Panel; Future  4. CKD (chronic kidney disease) stage 3, GFR 30-59 ml/min Stable, monitor renal function. Continue lasix - Microalbumin/Creatinine Ratio, Urine; Future  5. Type 2 diabetes mellitus with diabetic chronic kidney disease Continue current regimen of metformin, goal a1c < 7.5, continue lisinopril and simvastatin - TSH; Future - Hemoglobin A1c; Future - Microalbumin/Creatinine Ratio, Urine; Future - Pneumococcal conjugate vaccine 13-valent  6. Need for vaccination with 13-polyvalent pneumococcal conjugate vaccine - Pneumococcal conjugate vaccine 13-valent  7. Colon cancer screening - Fecal occult blood, imunochemical; Future

## 2014-07-12 DIAGNOSIS — M1712 Unilateral primary osteoarthritis, left knee: Secondary | ICD-10-CM | POA: Diagnosis not present

## 2014-07-17 ENCOUNTER — Encounter: Payer: Self-pay | Admitting: Podiatry

## 2014-07-17 ENCOUNTER — Ambulatory Visit (INDEPENDENT_AMBULATORY_CARE_PROVIDER_SITE_OTHER): Payer: Medicare Other | Admitting: Podiatry

## 2014-07-17 DIAGNOSIS — B351 Tinea unguium: Secondary | ICD-10-CM | POA: Diagnosis not present

## 2014-07-17 DIAGNOSIS — M79676 Pain in unspecified toe(s): Secondary | ICD-10-CM | POA: Diagnosis not present

## 2014-07-17 NOTE — Patient Instructions (Signed)
Diabetes and Foot Care Diabetes may cause you to have problems because of poor blood supply (circulation) to your feet and legs. This may cause the skin on your feet to become thinner, break easier, and heal more slowly. Your skin may become dry, and the skin may peel and crack. You may also have nerve damage in your legs and feet causing decreased feeling in them. You may not notice minor injuries to your feet that could lead to infections or more serious problems. Taking care of your feet is one of the most important things you can do for yourself.  HOME CARE INSTRUCTIONS  Wear shoes at all times, even in the house. Do not go barefoot. Bare feet are easily injured.  Check your feet daily for blisters, cuts, and redness. If you cannot see the bottom of your feet, use a mirror or ask someone for help.  Wash your feet with warm water (do not use hot water) and mild soap. Then pat your feet and the areas between your toes until they are completely dry. Do not soak your feet as this can dry your skin.  Apply a moisturizing lotion or petroleum jelly (that does not contain alcohol and is unscented) to the skin on your feet and to dry, brittle toenails. Do not apply lotion between your toes.  Trim your toenails straight across. Do not dig under them or around the cuticle. File the edges of your nails with an emery board or nail file.  Do not cut corns or calluses or try to remove them with medicine.  Wear clean socks or stockings every day. Make sure they are not too tight. Do not wear knee-high stockings since they may decrease blood flow to your legs.  Wear shoes that fit properly and have enough cushioning. To break in new shoes, wear them for just a few hours a day. This prevents you from injuring your feet. Always look in your shoes before you put them on to be sure there are no objects inside.  Do not cross your legs. This may decrease the blood flow to your feet.  If you find a minor scrape,  cut, or break in the skin on your feet, keep it and the skin around it clean and dry. These areas may be cleansed with mild soap and water. Do not cleanse the area with peroxide, alcohol, or iodine.  When you remove an adhesive bandage, be sure not to damage the skin around it.  If you have a wound, look at it several times a day to make sure it is healing.  Do not use heating pads or hot water bottles. They may burn your skin. If you have lost feeling in your feet or legs, you may not know it is happening until it is too late.  Make sure your health care provider performs a complete foot exam at least annually or more often if you have foot problems. Report any cuts, sores, or bruises to your health care provider immediately. SEEK MEDICAL CARE IF:   You have an injury that is not healing.  You have cuts or breaks in the skin.  You have an ingrown nail.  You notice redness on your legs or feet.  You feel burning or tingling in your legs or feet.  You have pain or cramps in your legs and feet.  Your legs or feet are numb.  Your feet always feel cold. SEEK IMMEDIATE MEDICAL CARE IF:   There is increasing redness,   swelling, or pain in or around a wound.  There is a red line that goes up your leg.  Pus is coming from a wound.  You develop a fever or as directed by your health care provider.  You notice a bad smell coming from an ulcer or wound. Document Released: 06/27/2000 Document Revised: 03/02/2013 Document Reviewed: 12/07/2012 Exit[employer] Patient Information 2015 [employer]. This information is not intended to replace advice given to you by your health care provider. Make sure you discuss any questions you have with your health care provider.  

## 2014-07-18 NOTE — Progress Notes (Signed)
Patient ID: Logan Bean, male   DOB: [birth year], 80 y.o.   MRN: [id number]  Subjective: This patient presents again today complaining of painful toenails and walking wearing shoes  Objective: Pleasant orientated 3 The toenails are elongated, discolored, incurvated and tender to palpation 6-10  Assessment: Symptomatic onychomycoses 6-10  Plan: Debrided toenails 10 without a bleeding Patient is extremely "ticklish" and very reactive during debridement  Reappoint 3 months

## 2014-08-21 ENCOUNTER — Encounter: Payer: Self-pay | Admitting: Internal Medicine

## 2014-08-21 ENCOUNTER — Ambulatory Visit (INDEPENDENT_AMBULATORY_CARE_PROVIDER_SITE_OTHER): Payer: Medicare Other | Admitting: Internal Medicine

## 2014-08-21 VITALS — BP 118/58 | HR 88 | Temp 98.2°F | Wt 153.0 lb

## 2014-08-21 DIAGNOSIS — J209 Acute bronchitis, unspecified: Secondary | ICD-10-CM

## 2014-08-21 MED ORDER — DOXYCYCLINE HYCLATE 100 MG PO TABS: 100.0000 mg | ORAL_TABLET | Freq: Two times a day (BID) | ORAL | Status: DC

## 2014-08-21 NOTE — Patient Instructions (Signed)
Acute Bronchitis Bronchitis is when the airways that extend from the windpipe into the lungs get red, puffy, and painful (inflamed). Bronchitis often causes thick spit (mucus) to develop. This leads to a cough. A cough is the most common symptom of bronchitis. In acute bronchitis, the condition usually begins suddenly and goes away over time (usually in 2 weeks). Smoking, allergies, and asthma can make bronchitis worse. Repeated episodes of bronchitis may cause more lung problems. HOME CARE  Rest.  Drink enough fluids to keep your pee (urine) clear or pale yellow (unless you need to limit fluids as told by your doctor).  Only take over-the-counter or prescription medicines as told by your doctor.  Avoid smoking and secondhand smoke. These can make bronchitis worse. If you are a smoker, think about using nicotine gum or skin patches. Quitting smoking will help your lungs heal faster.  Reduce the chance of getting bronchitis again by:  Washing your hands often.  Avoiding people with cold symptoms.  Trying not to touch your hands to your mouth, nose, or eyes.  Follow up with your doctor as told. GET HELP IF: Your symptoms do not improve after 1 week of treatment. Symptoms include:  Cough.  Fever.  Coughing up thick spit.  Body aches.  Chest congestion.  Chills.  Shortness of breath.  Sore throat. GET HELP RIGHT AWAY IF:   You have an increased fever.  You have chills.  You have severe shortness of breath.  You have bloody thick spit (sputum).  You throw up (vomit) often.  You lose too much body fluid (dehydration).  You have a severe headache.  You faint. MAKE SURE YOU:   Understand these instructions.  Will watch your condition.  Will get help right away if you are not doing well or get worse. Document Released: 12/17/2007 Document Revised: 03/02/2013 Document Reviewed: 12/21/2012 ExitCare Patient Information 2015 [employer]. This information is not  intended to replace advice given to you by your health care provider. Make sure you discuss any questions you have with your health care provider.  

## 2014-08-21 NOTE — Progress Notes (Signed)
Patient ID: Logan Bean, male   DOB: 01-Jan-1921, 81 y.o.   MRN: [id number]   Location:  [facility]  Code Status: full code  Allergies  Allergen Reactions  . Morphine And Related Nausea And Vomiting  . Oxycodone Nausea And Vomiting    Chief Complaint  Patient presents with  . Acute Visit    bad cold( runny nose and cough for 3 days now)    HPI: Patient is a 81 y.o. male with h/o diabetes, chf, afib, and ckd seen in the office today for an acute visit due to cough and runny nose for 3 days.  Has taken tylenol each morning and mucinex 12 hour with some improvement.  No fever.  Has checked temp at home and it's been low.  Has been very weak.  No ear pain, no headache.  Is short of breath.  Is coughing up mucus--a little discolored but did not examine very closely.  Had been in [place] a week and 1/2 ago and some friends also got ill a few days afterwards.      Glucose has been about the same when he's checked it over the past three days.   Review of Systems:  ROS   Past Medical History  Diagnosis Date  . Hypertension   . Hypercholesteremia   . Stomach ulcer     years  . H/O hiatal hernia   . Varicose veins     both legs  . Arthritis     left knee  . Diabetes mellitus     diet controlled, pt does not check cbg  . Left rotator cuff tear Jul 12, 2012  . Edema   . Unspecified hereditary and idiopathic peripheral neuropathy   . Dizziness and giddiness   . Unspecified vitamin D deficiency   . Diaphragmatic hernia without mention of obstruction or gangrene     Past Surgical History  Procedure Laterality Date  . Esopagus strectching  2003 and 3 yrs ago  . Tonsillectomy  age 24  . Shoulder open rotator cuff repair  11/05/2011    Procedure: ROTATOR CUFF REPAIR SHOULDER OPEN;  Surgeon: Tobi Bastos, MD;  Location: [facility];  Service: Orthopedics;  Laterality: Left;  Left Shoulder Rotator Cuff Repair Open with Graft and Anchors  .  Reverse shoulder arthroplasty Right 09/17/2012    Procedure: REVERSE SHOULDER ARTHROPLASTY;  Surgeon: Augustin Schooling, MD;  Location: [place];  Service: Orthopedics;  Laterality: Right;  . Orif ankle fracture Right 09/20/2012    Procedure: OPEN REDUCTION INTERNAL FIXATION (ORIF) ANKLE FRACTURE;  Surgeon: Johnn Hai, MD;  Location: [facility];  Service: Orthopedics;  Laterality: Right;  . Mastectomy      bilateral  . Cardioversion N/A 02/22/2013    Procedure: CARDIOVERSION;  Surgeon: Darlin Coco, MD;  Location: [facility];  Service: Cardiovascular;  Laterality: N/A;    Social History:   reports that he quit smoking about 18 years ago. His smoking use included Pipe and Cigarettes. He quit after 50 years of use. He has never used smokeless tobacco. He reports that he drinks alcohol. He reports that he does not use illicit drugs.  Family History  Problem Relation Age of Onset  . Parkinson's disease Mother   . Hip fracture Mother   . Heart disease Mother   . Alzheimer's disease Mother   . Heart disease Father   . Parkinson's disease Sister   . Heart disease Brother  Medications: Patient's Medications  New Prescriptions   No medications on file  Previous Medications   ACETAMINOPHEN (TYLENOL) 650 MG CR TABLET    Take 1,300 mg by mouth daily as needed (joint pain).    CALCIUM CARBONATE-VITAMIN D 600-200 MG-UNIT TABS    Take by mouth 2 (two) times daily. Take 1 tablet daily for vitamin supplement.   DEXTROMETHORPHAN-GUAIFENESIN (MUCINEX DM) 30-600 MG PER 12 HR TABLET    Take 1 tablet by mouth 2 (two) times daily.   ELIQUIS 5 MG TABS TABLET    TAKE 1 TABLET TWICE DAILY.   FUROSEMIDE (LASIX) 20 MG TABLET    TAKE (1/2) TABLET ONCE A DAY FOR EDEMA, HYPERTENSION.   GLUCOSAMINE-CHONDROIT-VIT C-MN (GLUCOSAMINE 1500 COMPLEX) CAPS    Take one tablet once a day   GLUCOSE BLOOD TEST STRIP    True Test Glucose Test Strips Check blood sugar once daily Dx: E11.9   GLUCOSE MONITORING KIT (FREESTYLE)  MONITORING KIT    1 each by Does not apply route as needed for other. Take blood sugar once daily.   LISINOPRIL (PRINIVIL,ZESTRIL) 20 MG TABLET    TAKE 1 TABLET DAILY FOR BLOOD PRESSURE.   METFORMIN (GLUCOPHAGE) 500 MG TABLET    Take one tablet of 500 mg every other day for your diabetes   METOPROLOL TARTRATE (LOPRESSOR) 25 MG TABLET    TAKE 1 TABLET TWICE DAILY.   MULTIPLE VITAMIN (MULITIVITAMIN WITH MINERALS) TABS    Take 1 tablet by mouth daily. Take 1 tablet daily for vitamin supplement.   OMEGA-3 FATTY ACIDS (FISH OIL) 1000 MG CAPS    Take 1,000 mg by mouth once.   SIMVASTATIN (ZOCOR) 10 MG TABLET    TAKE ONE TABLET AT BEDTIME.   TRUEPLUS LANCETS 30G MISC    Use to check blood sugar once daily. Dx E11.9  Modified Medications   No medications on file  Discontinued Medications   No medications on file     Physical Exam: Filed Vitals:   08/21/14 1209  BP: 118/58  Pulse: 88  Temp: 98.2 F (36.8 C)  TempSrc: Oral  Weight: 153 lb (69.4 kg)  SpO2: 96%  Physical Exam   Labs reviewed: Basic Metabolic Panel:  Recent Labs  10/04/13 1026  05/11/14 1330 05/18/14 1940 05/19/14 0500 07/05/14 1142  NA 143  < > 145* 133* 137  --   K 4.9  < > 4.7 4.6 4.7  --   CL 102  < > 103 95* 100  --   CO2 25  < > '27 23 23  ' --   GLUCOSE 125*  < > 121* 179* 257*  --   BUN 36  < > 35 36* 37*  --   CREATININE 1.29*  < > 1.13 1.12 1.02  --   CALCIUM 9.5  < > 9.9 10.5 10.0  --   TSH 0.515  --  0.289*  --   --  0.479  < > = values in this interval not displayed. Liver Function Tests:  Recent Labs  10/04/13 1026 05/11/14 1330 05/18/14 1940  AST '21 18 27  ' ALT '22 16 20  ' ALKPHOS 102 99 114  BILITOT 0.6 0.7 0.9  PROT 6.2 6.1 7.3  ALBUMIN  --   --  3.9   No results for input(s): LIPASE, AMYLASE in the last 8760 hours. No results for input(s): AMMONIA in the last 8760 hours. CBC:  Recent Labs  05/11/14 1330 05/18/14 1940 05/19/14 0500 05/30/14 1629  WBC  7.0 10.7* 8.4 9.6  NEUTROABS  5.0 9.9*  --   --   HGB 12.7 14.1 12.5* 12.4*  HCT 39.9 44.3 39.0 38.0  MCV 85 85.4 84.2 85  PLT  --  205 208 247   Lipid Panel:  Recent Labs  10/04/13 1026 05/11/14 1330  HDL 56 43  LDLCALC 89 90  TRIG 65 120  CHOLHDL 2.8 3.7   Lab Results  Component Value Date   HGBA1C 7.1* 05/11/2014   Assessment/Plan: 1. Acute bronchitis, unspecified organism - lungs were clear -pt feels he is starting to improve -cont mucinex and tylenol - doxycycline (VIBRA-TABS) 100 MG tablet; Take 1 tablet (100 mg total) by mouth 2 (two) times daily.  Dispense: 20 tablet; Refill: 0 -instructions for conservative mgt provided  Labs/tests ordered:  None at this time Next appt: keep regular visit with Dr. Sima Matas L. Chrissie Dacquisto, D.O. [facility] [address], [place] [address] Cell Phone (Mon-Fri 8am-5pm):  [phone] On Call:  [phone] & follow prompts after 5pm & weekends Office Phone:  [phone] Office Fax:  [phone]

## 2014-08-22 ENCOUNTER — Other Ambulatory Visit: Payer: Self-pay | Admitting: Internal Medicine

## 2014-08-22 ENCOUNTER — Other Ambulatory Visit: Payer: Self-pay | Admitting: Nurse Practitioner

## 2014-09-05 ENCOUNTER — Encounter: Payer: Self-pay | Admitting: Internal Medicine

## 2014-09-07 DIAGNOSIS — M1712 Unilateral primary osteoarthritis, left knee: Secondary | ICD-10-CM | POA: Diagnosis not present

## 2014-09-21 DIAGNOSIS — M25562 Pain in left knee: Secondary | ICD-10-CM | POA: Diagnosis not present

## 2014-09-21 DIAGNOSIS — M1712 Unilateral primary osteoarthritis, left knee: Secondary | ICD-10-CM | POA: Diagnosis not present

## 2014-09-21 DIAGNOSIS — M25561 Pain in right knee: Secondary | ICD-10-CM | POA: Diagnosis not present

## 2014-10-16 ENCOUNTER — Encounter: Payer: Self-pay | Admitting: Podiatry

## 2014-10-16 ENCOUNTER — Ambulatory Visit (INDEPENDENT_AMBULATORY_CARE_PROVIDER_SITE_OTHER): Payer: Medicare Other | Admitting: Podiatry

## 2014-10-16 DIAGNOSIS — B351 Tinea unguium: Secondary | ICD-10-CM

## 2014-10-16 DIAGNOSIS — M79676 Pain in unspecified toe(s): Secondary | ICD-10-CM

## 2014-10-16 NOTE — Patient Instructions (Signed)
Diabetes and Foot Care Diabetes may cause you to have problems because of poor blood supply (circulation) to your feet and legs. This may cause the skin on your feet to become thinner, break easier, and heal more slowly. Your skin may become dry, and the skin may peel and crack. You may also have nerve damage in your legs and feet causing decreased feeling in them. You may not notice minor injuries to your feet that could lead to infections or more serious problems. Taking care of your feet is one of the most important things you can do for yourself.  HOME CARE INSTRUCTIONS  Wear shoes at all times, even in the house. Do not go barefoot. Bare feet are easily injured.  Check your feet daily for blisters, cuts, and redness. If you cannot see the bottom of your feet, use a mirror or ask someone for help.  Wash your feet with warm water (do not use hot water) and mild soap. Then pat your feet and the areas between your toes until they are completely dry. Do not soak your feet as this can dry your skin.  Apply a moisturizing lotion or petroleum jelly (that does not contain alcohol and is unscented) to the skin on your feet and to dry, brittle toenails. Do not apply lotion between your toes.  Trim your toenails straight across. Do not dig under them or around the cuticle. File the edges of your nails with an emery board or nail file.  Do not cut corns or calluses or try to remove them with medicine.  Wear clean socks or stockings every day. Make sure they are not too tight. Do not wear knee-high stockings since they may decrease blood flow to your legs.  Wear shoes that fit properly and have enough cushioning. To break in new shoes, wear them for just a few hours a day. This prevents you from injuring your feet. Always look in your shoes before you put them on to be sure there are no objects inside.  Do not cross your legs. This may decrease the blood flow to your feet.  If you find a minor scrape,  cut, or break in the skin on your feet, keep it and the skin around it clean and dry. These areas may be cleansed with mild soap and water. Do not cleanse the area with peroxide, alcohol, or iodine.  When you remove an adhesive bandage, be sure not to damage the skin around it.  If you have a wound, look at it several times a day to make sure it is healing.  Do not use heating pads or hot water bottles. They may burn your skin. If you have lost feeling in your feet or legs, you may not know it is happening until it is too late.  Make sure your health care provider performs a complete foot exam at least annually or more often if you have foot problems. Report any cuts, sores, or bruises to your health care provider immediately. SEEK MEDICAL CARE IF:   You have an injury that is not healing.  You have cuts or breaks in the skin.  You have an ingrown nail.  You notice redness on your legs or feet.  You feel burning or tingling in your legs or feet.  You have pain or cramps in your legs and feet.  Your legs or feet are numb.  Your feet always feel cold. SEEK IMMEDIATE MEDICAL CARE IF:   There is increasing redness,   swelling, or pain in or around a wound.  There is a red line that goes up your leg.  Pus is coming from a wound.  You develop a fever or as directed by your health care provider.  You notice a bad smell coming from an ulcer or wound. Document Released: 06/27/2000 Document Revised: 03/02/2013 Document Reviewed: 12/07/2012 Exit[employer] Patient Information 2015 [employer]. This information is not intended to replace advice given to you by your health care provider. Make sure you discuss any questions you have with your health care provider.  

## 2014-10-17 NOTE — Progress Notes (Signed)
Patient ID: Logan Bean, male   DOB: [birth year], 81 y.o.   MRN: [id number]  Subjective: This patient presents today complaining of painful toenails and requests toenail debridement  Objective: Orientated 3 The toenails are discolored, elongated, brittle, and tender to direct palpation 6-10  Assessment: Symptomatic onychomycoses 6-10  Plan: Debridement toenails 10 without any bleeding Patient is extremely reactive to light pressure and describing himself as ticklish,  Reappoint 3 months

## 2014-11-21 ENCOUNTER — Other Ambulatory Visit: Payer: Self-pay | Admitting: Cardiology

## 2014-11-21 ENCOUNTER — Other Ambulatory Visit: Payer: Self-pay | Admitting: Internal Medicine

## 2014-11-21 NOTE — Telephone Encounter (Signed)
Rx(s) sent to pharmacy electronically.  

## 2014-12-06 DIAGNOSIS — M170 Bilateral primary osteoarthritis of knee: Secondary | ICD-10-CM | POA: Diagnosis not present

## 2014-12-08 ENCOUNTER — Other Ambulatory Visit: Payer: Medicare Other

## 2014-12-08 DIAGNOSIS — N183 Chronic kidney disease, stage 3 unspecified: Secondary | ICD-10-CM

## 2014-12-08 DIAGNOSIS — E785 Hyperlipidemia, unspecified: Secondary | ICD-10-CM | POA: Diagnosis not present

## 2014-12-08 DIAGNOSIS — E1122 Type 2 diabetes mellitus with diabetic chronic kidney disease: Secondary | ICD-10-CM | POA: Diagnosis not present

## 2014-12-08 DIAGNOSIS — I10 Essential (primary) hypertension: Secondary | ICD-10-CM

## 2014-12-08 DIAGNOSIS — N189 Chronic kidney disease, unspecified: Secondary | ICD-10-CM | POA: Diagnosis not present

## 2014-12-08 DIAGNOSIS — I4891 Unspecified atrial fibrillation: Secondary | ICD-10-CM

## 2014-12-09 LAB — COMPREHENSIVE METABOLIC PANEL
ALK PHOS: 94 IU/L (ref 39–117)
ALT: 22 IU/L (ref 0–44)
AST: 20 IU/L (ref 0–40)
Albumin/Globulin Ratio: 2 (ref 1.1–2.5)
Albumin: 4.1 g/dL (ref 3.2–4.6)
BUN/Creatinine Ratio: 37 — ABNORMAL HIGH (ref 10–22)
BUN: 43 mg/dL — ABNORMAL HIGH (ref 10–36)
Bilirubin Total: 0.7 mg/dL (ref 0.0–1.2)
CHLORIDE: 100 mmol/L (ref 97–108)
CO2: 23 mmol/L (ref 18–29)
CREATININE: 1.15 mg/dL (ref 0.76–1.27)
Calcium: 9.5 mg/dL (ref 8.6–10.2)
GFR calc Af Amer: 64 mL/min/{1.73_m2} (ref >59)
GFR calc non Af Amer: 55 mL/min/{1.73_m2} — ABNORMAL LOW (ref >59)
GLOBULIN, TOTAL: 2.1 g/dL (ref 1.5–4.5)
Glucose: 202 mg/dL — ABNORMAL HIGH (ref 65–99)
Potassium: 5.3 mmol/L — ABNORMAL HIGH (ref 3.5–5.2)
Sodium: 137 mmol/L (ref 134–144)
TOTAL PROTEIN: 6.2 g/dL (ref 6.0–8.5)

## 2014-12-09 LAB — HEMOGLOBIN A1C
Est. average glucose Bld gHb Est-mCnc: 166 mg/dL
Hgb A1c MFr Bld: 7.4 % — ABNORMAL HIGH (ref 4.8–5.6)

## 2014-12-09 LAB — CBC WITH DIFFERENTIAL/PLATELET
BASOS ABS: 0 10*3/uL (ref 0.0–0.2)
BASOS: 0 %
EOS (ABSOLUTE): 0 10*3/uL (ref 0.0–0.4)
Eos: 0 %
HEMATOCRIT: 39.1 % (ref 37.5–51.0)
Hemoglobin: 12.2 g/dL — ABNORMAL LOW (ref 12.6–17.7)
Immature Grans (Abs): 0 10*3/uL (ref 0.0–0.1)
Immature Granulocytes: 0 %
LYMPHS ABS: 1.2 10*3/uL (ref 0.7–3.1)
LYMPHS: 10 %
MCH: 26.1 pg — AB (ref 26.6–33.0)
MCHC: 31.2 g/dL — ABNORMAL LOW (ref 31.5–35.7)
MCV: 84 fL (ref 79–97)
MONOCYTES: 3 %
MONOS ABS: 0.3 10*3/uL (ref 0.1–0.9)
NEUTROS ABS: 10 10*3/uL — AB (ref 1.4–7.0)
NEUTROS PCT: 87 %
Platelets: 240 10*3/uL (ref 150–379)
RBC: 4.68 x10E6/uL (ref 4.14–5.80)
RDW: 15.7 % — ABNORMAL HIGH (ref 12.3–15.4)
WBC: 11.5 10*3/uL — ABNORMAL HIGH (ref 3.4–10.8)

## 2014-12-09 LAB — LIPID PANEL
Chol/HDL Ratio: 3.2 ratio units (ref 0.0–5.0)
Cholesterol, Total: 168 mg/dL (ref 100–199)
HDL: 53 mg/dL (ref >39)
LDL Calculated: 100 mg/dL — ABNORMAL HIGH (ref 0–99)
Triglycerides: 73 mg/dL (ref 0–149)
VLDL Cholesterol Cal: 15 mg/dL (ref 5–40)

## 2014-12-09 LAB — MICROALBUMIN / CREATININE URINE RATIO
Creatinine, Urine: 94.3 mg/dL
MICROALB/CREAT RATIO: 36.5 mg/g creat — ABNORMAL HIGH (ref 0.0–30.0)
Microalbumin, Urine: 34.4 ug/mL

## 2014-12-09 LAB — TSH: TSH: 0.399 u[IU]/mL — ABNORMAL LOW (ref 0.450–4.500)

## 2014-12-12 ENCOUNTER — Ambulatory Visit: Payer: Medicare Other | Admitting: Internal Medicine

## 2014-12-19 ENCOUNTER — Ambulatory Visit: Payer: Medicare Other | Admitting: Internal Medicine

## 2014-12-21 ENCOUNTER — Ambulatory Visit (INDEPENDENT_AMBULATORY_CARE_PROVIDER_SITE_OTHER): Payer: Medicare Other | Admitting: Nurse Practitioner

## 2014-12-21 ENCOUNTER — Encounter: Payer: Self-pay | Admitting: Nurse Practitioner

## 2014-12-21 VITALS — BP 118/70 | HR 71 | Temp 97.4°F | Resp 20 | Ht 65.0 in | Wt 150.6 lb

## 2014-12-21 DIAGNOSIS — E119 Type 2 diabetes mellitus without complications: Secondary | ICD-10-CM | POA: Diagnosis not present

## 2014-12-21 DIAGNOSIS — N183 Chronic kidney disease, stage 3 unspecified: Secondary | ICD-10-CM

## 2014-12-21 DIAGNOSIS — E785 Hyperlipidemia, unspecified: Secondary | ICD-10-CM | POA: Diagnosis not present

## 2014-12-21 DIAGNOSIS — I10 Essential (primary) hypertension: Secondary | ICD-10-CM | POA: Diagnosis not present

## 2014-12-21 DIAGNOSIS — M170 Bilateral primary osteoarthritis of knee: Secondary | ICD-10-CM

## 2014-12-21 DIAGNOSIS — E0590 Thyrotoxicosis, unspecified without thyrotoxic crisis or storm: Secondary | ICD-10-CM

## 2014-12-21 DIAGNOSIS — I482 Chronic atrial fibrillation, unspecified: Secondary | ICD-10-CM

## 2014-12-21 NOTE — Progress Notes (Signed)
Patient ID: Logan Bean, male   DOB: Jul 27, 1920, 81 y.o.   MRN: [id number]    PCP: Lauree Chandler, NP  Allergies  Allergen Reactions  . Morphine And Related Nausea And Vomiting  . Oxycodone Nausea And Vomiting    Chief Complaint  Patient presents with  . Medical Management of Chronic Issues     HPI: Patient is a 81 y.o. male seen in the office today for routine follow up of chronic conditions. pt with h/o diabetes, chf, afib, and ckd, OA of bilateral knees.   Following with ortho for knee injections due to OA. Injection due next month. Having a little bit of trouble with his knees today  Blood sugars 100-150 fasting, this morning 125 per pt (did not bring record)  Lives in an in-law suite with a friend who helps takes care of him.   Currently drives.   Takes 1/2-1 tablet of lasix as needed for swelling of his legs.   No falls since hospitalization in November for fall and laceration  Review of Systems:  Review of Systems  Constitutional: Negative for activity change, appetite change, fatigue and unexpected weight change.  HENT: Positive for hearing loss. Negative for congestion.   Eyes: Negative.   Respiratory: Negative for cough and shortness of breath.   Cardiovascular: Negative for chest pain, palpitations and leg swelling.  Gastrointestinal: Negative for abdominal pain, diarrhea and constipation.  Genitourinary: Negative for dysuria and difficulty urinating.  Musculoskeletal: Positive for arthralgias and gait problem. Negative for myalgias.       Bilateral knee pain, uses walking stick   Skin: Negative for color change and wound.  Neurological: Negative for dizziness and weakness.  Psychiatric/Behavioral: Negative for behavioral problems, confusion and agitation.    Past Medical History  Diagnosis Date  . Hypertension   . Hypercholesteremia   . Stomach ulcer     years  . H/O hiatal hernia   . Varicose veins     both legs  . Arthritis     left knee  .  Diabetes mellitus     diet controlled, pt does not check cbg  . Left rotator cuff tear Jul 12, 2012  . Edema   . Unspecified hereditary and idiopathic peripheral neuropathy   . Dizziness and giddiness   . Unspecified vitamin D deficiency   . Diaphragmatic hernia without mention of obstruction or gangrene    Past Surgical History  Procedure Laterality Date  . Esopagus strectching  2003 and 3 yrs ago  . Tonsillectomy  age 13  . Shoulder open rotator cuff repair  11/05/2011    Procedure: ROTATOR CUFF REPAIR SHOULDER OPEN;  Surgeon: Tobi Bastos, MD;  Location: [facility];  Service: Orthopedics;  Laterality: Left;  Left Shoulder Rotator Cuff Repair Open with Graft and Anchors  . Reverse shoulder arthroplasty Right 09/17/2012    Procedure: REVERSE SHOULDER ARTHROPLASTY;  Surgeon: Augustin Schooling, MD;  Location: [place];  Service: Orthopedics;  Laterality: Right;  . Orif ankle fracture Right 09/20/2012    Procedure: OPEN REDUCTION INTERNAL FIXATION (ORIF) ANKLE FRACTURE;  Surgeon: Johnn Hai, MD;  Location: [facility];  Service: Orthopedics;  Laterality: Right;  . Mastectomy      bilateral  . Cardioversion N/A 02/22/2013    Procedure: CARDIOVERSION;  Surgeon: Darlin Coco, MD;  Location: [facility];  Service: Cardiovascular;  Laterality: N/A;   Social History:   reports that he quit smoking about 18 years ago. His smoking use included Pipe and Cigarettes.  He quit after 50 years of use. He has never used smokeless tobacco. He reports that he drinks alcohol. He reports that he does not use illicit drugs.  Family History  Problem Relation Age of Onset  . Parkinson's disease Mother   . Hip fracture Mother   . Heart disease Mother   . Alzheimer's disease Mother   . Heart disease Father   . Parkinson's disease Sister   . Heart disease Brother     Medications: Patient's Medications  New Prescriptions   No medications on file  Previous Medications   ACETAMINOPHEN (TYLENOL) 650 MG CR  TABLET    Take 1,300 mg by mouth daily as needed (joint pain).    CALCIUM CARBONATE-VITAMIN D 600-200 MG-UNIT TABS    Take by mouth 2 (two) times daily. Take 1 tablet daily for vitamin supplement.   ELIQUIS 5 MG TABS TABLET    TAKE 1 TABLET TWICE DAILY.   FUROSEMIDE (LASIX) 20 MG TABLET    TAKE (1/2) TABLET ONCE A DAY FOR EDEMA, HYPERTENSION.   GLUCOSAMINE-CHONDROIT-VIT C-MN (GLUCOSAMINE 1500 COMPLEX) CAPS    Take one tablet once a day   GLUCOSE BLOOD TEST STRIP    True Test Glucose Test Strips Check blood sugar once daily Dx: E11.9   GLUCOSE MONITORING KIT (FREESTYLE) MONITORING KIT    1 each by Does not apply route as needed for other. Take blood sugar once daily.   LISINOPRIL (PRINIVIL,ZESTRIL) 20 MG TABLET    TAKE 1 TABLET DAILY FOR BLOOD PRESSURE.   METFORMIN (GLUCOPHAGE) 500 MG TABLET    TAKE ONE TABLET EVERY OTHER DAY.   METOPROLOL TARTRATE (LOPRESSOR) 25 MG TABLET    Take 1 tablet (25 mg total) by mouth 2 (two) times daily. NEEDS APPOINTMENT FOR FUTURE REFILLS   MULTIPLE VITAMIN (MULITIVITAMIN WITH MINERALS) TABS    Take 1 tablet by mouth daily. Take 1 tablet daily for vitamin supplement.   OMEGA-3 FATTY ACIDS (FISH OIL) 1000 MG CAPS    Take 1,000 mg by mouth once.   SIMVASTATIN (ZOCOR) 10 MG TABLET    TAKE ONE TABLET AT BEDTIME.   TRUEPLUS LANCETS 30G MISC    Use to check blood sugar once daily. Dx E11.9  Modified Medications   No medications on file  Discontinued Medications   DEXTROMETHORPHAN-GUAIFENESIN (MUCINEX DM) 30-600 MG PER 12 HR TABLET    Take 1 tablet by mouth 2 (two) times daily.   DOXYCYCLINE (VIBRA-TABS) 100 MG TABLET    Take 1 tablet (100 mg total) by mouth 2 (two) times daily.     Physical Exam:  Filed Vitals:   12/21/14 1516  BP: 118/70  Pulse: 71  Temp: 97.4 F (36.3 C)  TempSrc: Oral  Resp: 20  Height: '5\' 5"'  (1.651 m)  Weight: 150 lb 9.6 oz (68.312 kg)  SpO2: 94%    Physical Exam  Constitutional: He is oriented to person, place, and time. He appears  well-nourished. No distress.  Frail elderly man  HENT:  Head: Normocephalic and atraumatic.  Mouth/Throat: Oropharynx is clear and moist. No oropharyngeal exudate.  Eyes: Conjunctivae and EOM are normal. Pupils are equal, round, and reactive to light.  Neck: Normal range of motion. Neck supple.  Cardiovascular: Normal rate and normal heart sounds.  An irregularly irregular rhythm present.  Pulmonary/Chest: Effort normal and breath sounds normal.  Abdominal: Soft. Bowel sounds are normal.  Musculoskeletal: He exhibits no edema or tenderness.  Neurological: He is alert and oriented to person, place, and time.  Skin: Skin is warm and dry. He is not diaphoretic.  Psychiatric: He has a normal mood and affect.    Labs reviewed: Basic Metabolic Panel:  Recent Labs  05/11/14 1330 05/18/14 1940 05/19/14 0500 07/05/14 1142 12/08/14 1102  NA 145* 133* 137  --  137  K 4.7 4.6 4.7  --  5.3*  CL 103 95* 100  --  100  CO2 '27 23 23  ' --  23  GLUCOSE 121* 179* 257*  --  202*  BUN 35 36* 37*  --  43*  CREATININE 1.13 1.12 1.02  --  1.15  CALCIUM 9.9 10.5 10.0  --  9.5  TSH 0.289*  --   --  0.479 0.399*   Liver Function Tests:  Recent Labs  05/11/14 1330 05/18/14 1940 12/08/14 1102  AST '18 27 20  ' ALT '16 20 22  ' ALKPHOS 99 114 94  BILITOT 0.7 0.9 0.7  PROT 6.1 7.3 6.2  ALBUMIN  --  3.9  --    No results for input(s): LIPASE, AMYLASE in the last 8760 hours. No results for input(s): AMMONIA in the last 8760 hours. CBC:  Recent Labs  05/11/14 1330  05/18/14 1940 05/19/14 0500 05/30/14 1629 12/08/14 1102  WBC 7.0  < > 10.7* 8.4 9.6 11.5*  NEUTROABS 5.0  --  9.9*  --   --  10.0*  HGB 12.7  --  14.1 12.5* 12.4*  --   HCT 39.9  --  44.3 39.0 38.0 39.1  MCV 85  --  85.4 84.2 85  --   PLT  --   --  205 208 247  --   < > = values in this interval not displayed. Lipid Panel:  Recent Labs  05/11/14 1330 12/08/14 1102  CHOL 157 168  HDL 43 53  LDLCALC 90 100*  TRIG 120 73    CHOLHDL 3.7 3.2   TSH:  Recent Labs  05/11/14 1330 07/05/14 1142 12/08/14 1102  TSH 0.289* 0.479 0.399*   A1C: Lab Results  Component Value Date   HGBA1C 7.4* 12/08/2014     Assessment/Plan 1. Chronic atrial fibrillation -rate controlled, conts on metoprolol and eliquis for anticoagulation, no additional falls noted  - CBC with Differential; Future  2. Hyperthyroidism, subclinical -stable at this time, ongoing monitoring  - TSH; Future  3. Diabetes type 2, controlled -slightly worse than A1c in October of 2015, pt is adamet his blood sugars are at goal fasting, however on lab glucose is elevated, does not have log, will cont current dose and recheck pt prior to next visit - Hemoglobin A1c; Future  4. Essential hypertension Blood pressure stable on current regimen  5. CKD (chronic kidney disease) stage 3, GFR 30-59 ml/min -labs reviewed with pt BUN/Cr stable, remains on lisinopril for renal protection, avoid NSAIDs, to maintain hydration - Basic metabolic panel; Future  6. Hyperlipidemia -lipids reviewed with pt   7. Osteoarthritis of both knees, unspecified osteoarthritis type -some days worse than others, follows with ortho for injections, uses tylenol as needed

## 2014-12-21 NOTE — Patient Instructions (Signed)
Follow up in 4 months with lab work prior to visit.  

## 2014-12-25 ENCOUNTER — Other Ambulatory Visit: Payer: Self-pay | Admitting: Cardiology

## 2014-12-25 NOTE — Telephone Encounter (Signed)
Rx has been sent to the pharmacy electronically. ° °

## 2015-01-16 DIAGNOSIS — M25562 Pain in left knee: Secondary | ICD-10-CM | POA: Diagnosis not present

## 2015-01-16 DIAGNOSIS — M25561 Pain in right knee: Secondary | ICD-10-CM | POA: Diagnosis not present

## 2015-01-24 DIAGNOSIS — M1712 Unilateral primary osteoarthritis, left knee: Secondary | ICD-10-CM | POA: Diagnosis not present

## 2015-01-26 ENCOUNTER — Other Ambulatory Visit: Payer: Self-pay | Admitting: Cardiology

## 2015-01-26 DIAGNOSIS — H5211 Myopia, right eye: Secondary | ICD-10-CM | POA: Diagnosis not present

## 2015-01-26 DIAGNOSIS — E119 Type 2 diabetes mellitus without complications: Secondary | ICD-10-CM | POA: Diagnosis not present

## 2015-01-26 DIAGNOSIS — H5202 Hypermetropia, left eye: Secondary | ICD-10-CM | POA: Diagnosis not present

## 2015-01-26 DIAGNOSIS — H52223 Regular astigmatism, bilateral: Secondary | ICD-10-CM | POA: Diagnosis not present

## 2015-01-26 DIAGNOSIS — H524 Presbyopia: Secondary | ICD-10-CM | POA: Diagnosis not present

## 2015-01-29 ENCOUNTER — Other Ambulatory Visit: Payer: Self-pay

## 2015-01-29 MED ORDER — METOPROLOL TARTRATE 25 MG PO TABS: 25.0000 mg | ORAL_TABLET | Freq: Two times a day (BID) | ORAL | Status: DC

## 2015-01-31 DIAGNOSIS — M170 Bilateral primary osteoarthritis of knee: Secondary | ICD-10-CM | POA: Diagnosis not present

## 2015-02-05 ENCOUNTER — Ambulatory Visit: Payer: Medicare Other | Admitting: Podiatry

## 2015-02-06 ENCOUNTER — Encounter: Payer: Self-pay | Admitting: Cardiology

## 2015-02-06 ENCOUNTER — Ambulatory Visit (INDEPENDENT_AMBULATORY_CARE_PROVIDER_SITE_OTHER): Payer: Medicare Other | Admitting: Cardiology

## 2015-02-06 VITALS — BP 128/60 | HR 87 | Ht 63.0 in | Wt 152.6 lb

## 2015-02-06 DIAGNOSIS — E1122 Type 2 diabetes mellitus with diabetic chronic kidney disease: Secondary | ICD-10-CM

## 2015-02-06 DIAGNOSIS — N183 Chronic kidney disease, stage 3 unspecified: Secondary | ICD-10-CM

## 2015-02-06 DIAGNOSIS — N189 Chronic kidney disease, unspecified: Secondary | ICD-10-CM

## 2015-02-06 DIAGNOSIS — I482 Chronic atrial fibrillation, unspecified: Secondary | ICD-10-CM

## 2015-02-06 DIAGNOSIS — Z7901 Long term (current) use of anticoagulants: Secondary | ICD-10-CM

## 2015-02-06 DIAGNOSIS — I4891 Unspecified atrial fibrillation: Secondary | ICD-10-CM | POA: Diagnosis not present

## 2015-02-06 DIAGNOSIS — I10 Essential (primary) hypertension: Secondary | ICD-10-CM

## 2015-02-06 NOTE — Assessment & Plan Note (Signed)
SCr 1.15, wgt 69 kg

## 2015-02-06 NOTE — Assessment & Plan Note (Signed)
Mechanical fall Nov 2015- no recurrance

## 2015-02-06 NOTE — Progress Notes (Signed)
02/06/2015 Logan Bean   1921-04-17  [id number]  Primary Physician Lauree Chandler, NP Primary Cardiologist: Dr Percival Spanish  HPI:  81y.o. male with a hx of remote bilateral mastectomy, HTN, HL, DM2, CKD stage 3, diastolic CHF, and chronic AFib on Eliquis. He is followed by Dr Percival Spanish. He had a mechanical fall in Nov 2015 and we saw him in consult then for AF with RVR.  He is in the office today after his pharmacist suggested he be seen when the pt had his medications refilled. He denies any awareness of his AF. He has had no further falls. He is now living in an "in law" appartment at a friends house.    Current Outpatient Prescriptions  Medication Sig Dispense Refill  . acetaminophen (TYLENOL) 650 MG CR tablet Take 1,300 mg by mouth daily as needed (joint pain).     . Calcium Carbonate-Vitamin D 600-200 MG-UNIT TABS Take by mouth 2 (two) times daily. Take 1 tablet daily for vitamin supplement.    Marland Kitchen ELIQUIS 5 MG TABS tablet TAKE 1 TABLET TWICE DAILY. 60 tablet 3  . furosemide (LASIX) 20 MG tablet TAKE (1/2) TABLET ONCE A DAY FOR EDEMA, HYPERTENSION. 30 tablet 2  . Glucosamine-Chondroit-Vit C-Mn (GLUCOSAMINE 1500 COMPLEX) CAPS Take one tablet once a day 30 each 0  . glucose blood test strip True Test Glucose Test Strips Check blood sugar once daily Dx: E11.9 50 each 12  . glucose monitoring kit (FREESTYLE) monitoring kit 1 each by Does not apply route as needed for other. Take blood sugar once daily. 1 each 0  . lisinopril (PRINIVIL,ZESTRIL) 20 MG tablet TAKE 1 TABLET DAILY FOR BLOOD PRESSURE. 30 tablet 5  . metFORMIN (GLUCOPHAGE) 500 MG tablet TAKE ONE TABLET EVERY OTHER DAY. 30 tablet 3  . metoprolol tartrate (LOPRESSOR) 25 MG tablet Take 1 tablet (25 mg total) by mouth 2 (two) times daily. 60 tablet 0  . Multiple Vitamin (MULITIVITAMIN WITH MINERALS) TABS Take 1 tablet by mouth daily. Take 1 tablet daily for vitamin supplement.    . Omega-3 Fatty Acids (FISH OIL) 1000 MG CAPS Take  1,000 mg by mouth once.    . simvastatin (ZOCOR) 10 MG tablet TAKE ONE TABLET AT BEDTIME. 30 tablet 5  . TRUEPLUS LANCETS 30G MISC Use to check blood sugar once daily. Dx E11.9 50 each 12   No current facility-administered medications for this visit.    Allergies  Allergen Reactions  . Morphine And Related Nausea And Vomiting  . Oxycodone Nausea And Vomiting    History   Social History  . Marital Status: Widowed    Spouse Name: N/A  . Number of Children: 0  . Years of Education: N/A   Occupational History  . Not on file.   Social History Main Topics  . Smoking status: Former Smoker -- 70 years    Types: Pipe, Cigarettes    Quit date: 07/14/1996  . Smokeless tobacco: Never Used  . Alcohol Use: Yes     Comment: occasional beer  . Drug Use: No  . Sexual Activity: Not on file   Other Topics Concern  . Not on file   Social History Narrative   Lives alone.     Review of Systems: General: negative for chills, fever, night sweats or weight changes.  Cardiovascular: negative for chest pain, dyspnea on exertion, edema, orthopnea, palpitations, paroxysmal nocturnal dyspnea or shortness of breath Dermatological: negative for rash Respiratory: negative for cough or wheezing Urologic: negative for hematuria  Abdominal: negative for nausea, vomiting, diarrhea, bright red blood per rectum, melena, or hematemesis Neurologic: negative for visual changes, syncope, or dizziness All other systems reviewed and are otherwise negative except as noted above.    Blood pressure 128/60, pulse 87, height '5\' 3"'  (1.6 m), weight 152 lb 9.6 oz (69.219 kg).  General appearance: alert, cooperative and no distress Neck: no JVD Lungs: clear to auscultation bilaterally Heart: irregularly irregular rhythm Skin: pale cool dry Neurologic: Grossly normal  EKG AF with CVR  ASSESSMENT AND PLAN:   Chronic atrial fibrillation Rate controlled, he is asymptomatic  Chronic  anticoagulation-Eliquis SCr 1.15, wgt 69 kg  CKD (chronic kidney disease) stage 3, GFR 30-59 ml/min Stable May 2016  Essential hypertension Controlled  Fall Mechanical fall Nov 2015- no recurrance  Type 2 diabetes mellitus with diabetic chronic kidney disease Followed by PCP   PLAN  Same Rx. He can f/u with Dr Percival Spanish in a year or sooner if needed. He is scheduled to f/u with his new PCP at [facility].   Kerin Ransom K PA-C 02/06/2015 4:10 PM

## 2015-02-06 NOTE — Assessment & Plan Note (Signed)
Stable May 2016

## 2015-02-06 NOTE — Patient Instructions (Signed)
Your physician recommends that you schedule a follow-up appointment in:  One year with Dr. Hochrein  

## 2015-02-06 NOTE — Assessment & Plan Note (Signed)
Controlled.  

## 2015-02-06 NOTE — Assessment & Plan Note (Signed)
Followed by PCP

## 2015-02-06 NOTE — Assessment & Plan Note (Signed)
Rate controlled, he is asymptomatic

## 2015-02-07 ENCOUNTER — Encounter: Payer: Self-pay | Admitting: Podiatry

## 2015-02-07 ENCOUNTER — Ambulatory Visit (INDEPENDENT_AMBULATORY_CARE_PROVIDER_SITE_OTHER): Payer: Medicare Other | Admitting: Podiatry

## 2015-02-07 DIAGNOSIS — M79676 Pain in unspecified toe(s): Secondary | ICD-10-CM

## 2015-02-07 DIAGNOSIS — B351 Tinea unguium: Secondary | ICD-10-CM | POA: Diagnosis not present

## 2015-02-07 NOTE — Patient Instructions (Signed)
Diabetes and Foot Care Diabetes may cause you to have problems because of poor blood supply (circulation) to your feet and legs. This may cause the skin on your feet to become thinner, break easier, and heal more slowly. Your skin may become dry, and the skin may peel and crack. You may also have nerve damage in your legs and feet causing decreased feeling in them. You may not notice minor injuries to your feet that could lead to infections or more serious problems. Taking care of your feet is one of the most important things you can do for yourself.  HOME CARE INSTRUCTIONS  Wear shoes at all times, even in the house. Do not go barefoot. Bare feet are easily injured.  Check your feet daily for blisters, cuts, and redness. If you cannot see the bottom of your feet, use a mirror or ask someone for help.  Wash your feet with warm water (do not use hot water) and mild soap. Then pat your feet and the areas between your toes until they are completely dry. Do not soak your feet as this can dry your skin.  Apply a moisturizing lotion or petroleum jelly (that does not contain alcohol and is unscented) to the skin on your feet and to dry, brittle toenails. Do not apply lotion between your toes.  Trim your toenails straight across. Do not dig under them or around the cuticle. File the edges of your nails with an emery board or nail file.  Do not cut corns or calluses or try to remove them with medicine.  Wear clean socks or stockings every day. Make sure they are not too tight. Do not wear knee-high stockings since they may decrease blood flow to your legs.  Wear shoes that fit properly and have enough cushioning. To break in new shoes, wear them for just a few hours a day. This prevents you from injuring your feet. Always look in your shoes before you put them on to be sure there are no objects inside.  Do not cross your legs. This may decrease the blood flow to your feet.  If you find a minor scrape,  cut, or break in the skin on your feet, keep it and the skin around it clean and dry. These areas may be cleansed with mild soap and water. Do not cleanse the area with peroxide, alcohol, or iodine.  When you remove an adhesive bandage, be sure not to damage the skin around it.  If you have a wound, look at it several times a day to make sure it is healing.  Do not use heating pads or hot water bottles. They may burn your skin. If you have lost feeling in your feet or legs, you may not know it is happening until it is too late.  Make sure your health care provider performs a complete foot exam at least annually or more often if you have foot problems. Report any cuts, sores, or bruises to your health care provider immediately. SEEK MEDICAL CARE IF:   You have an injury that is not healing.  You have cuts or breaks in the skin.  You have an ingrown nail.  You notice redness on your legs or feet.  You feel burning or tingling in your legs or feet.  You have pain or cramps in your legs and feet.  Your legs or feet are numb.  Your feet always feel cold. SEEK IMMEDIATE MEDICAL CARE IF:   There is increasing redness,   swelling, or pain in or around a wound.  There is a red line that goes up your leg.  Pus is coming from a wound.  You develop a fever or as directed by your health care provider.  You notice a bad smell coming from an ulcer or wound. Document Released: 06/27/2000 Document Revised: 03/02/2013 Document Reviewed: 12/07/2012 Exit[employer] Patient Information 2015 [employer]. This information is not intended to replace advice given to you by your health care provider. Make sure you discuss any questions you have with your health care provider.  

## 2015-02-08 NOTE — Progress Notes (Signed)
Patient ID: Logan Bean, male   DOB: 16-Sep-1920, 81 y.o.   MRN: [id number]  Subjective: This patient presents for scheduled visit complaining of painful toenails and requests toenail debridement  Assessment: The toenails are hypertrophic, elongated, brittle, discolored,, incurvated and tender direct palpation 6-10  Assessment: Symptomatic onychomycoses 6-10 Type II diabetic  Plan: Debridement of toenails 10 without any bleeding. Please note this patient is screaming reactive light pressure complains of a ticklish-like sensation making debridement difficult at times

## 2015-02-25 ENCOUNTER — Other Ambulatory Visit: Payer: Self-pay | Admitting: Cardiology

## 2015-03-07 DIAGNOSIS — M170 Bilateral primary osteoarthritis of knee: Secondary | ICD-10-CM | POA: Diagnosis not present

## 2015-03-26 ENCOUNTER — Other Ambulatory Visit: Payer: Self-pay | Admitting: Nurse Practitioner

## 2015-04-14 DIAGNOSIS — Z23 Encounter for immunization: Secondary | ICD-10-CM | POA: Diagnosis not present

## 2015-04-19 ENCOUNTER — Other Ambulatory Visit: Payer: Medicare Other

## 2015-04-19 DIAGNOSIS — N183 Chronic kidney disease, stage 3 unspecified: Secondary | ICD-10-CM

## 2015-04-19 DIAGNOSIS — E0590 Thyrotoxicosis, unspecified without thyrotoxic crisis or storm: Secondary | ICD-10-CM

## 2015-04-19 DIAGNOSIS — E119 Type 2 diabetes mellitus without complications: Secondary | ICD-10-CM | POA: Diagnosis not present

## 2015-04-19 DIAGNOSIS — I482 Chronic atrial fibrillation, unspecified: Secondary | ICD-10-CM

## 2015-04-20 ENCOUNTER — Other Ambulatory Visit: Payer: PRIVATE HEALTH INSURANCE

## 2015-04-20 LAB — HEMOGLOBIN A1C
Est. average glucose Bld gHb Est-mCnc: 174 mg/dL
Hgb A1c MFr Bld: 7.7 % — ABNORMAL HIGH (ref 4.8–5.6)

## 2015-04-20 LAB — CBC WITH DIFFERENTIAL/PLATELET
BASOS: 1 %
Basophils Absolute: 0 10*3/uL (ref 0.0–0.2)
EOS (ABSOLUTE): 0.1 10*3/uL (ref 0.0–0.4)
Eos: 2 %
HEMATOCRIT: 43.2 % (ref 37.5–51.0)
Hemoglobin: 13.7 g/dL (ref 12.6–17.7)
Immature Grans (Abs): 0 10*3/uL (ref 0.0–0.1)
Immature Granulocytes: 0 %
Lymphocytes Absolute: 1.7 10*3/uL (ref 0.7–3.1)
Lymphs: 27 %
MCH: 26.8 pg (ref 26.6–33.0)
MCHC: 31.7 g/dL (ref 31.5–35.7)
MCV: 85 fL (ref 79–97)
Monocytes Absolute: 0.6 10*3/uL (ref 0.1–0.9)
Monocytes: 9 %
Neutrophils Absolute: 3.8 10*3/uL (ref 1.4–7.0)
Neutrophils: 61 %
PLATELETS: 211 10*3/uL (ref 150–379)
RBC: 5.11 x10E6/uL (ref 4.14–5.80)
RDW: 16.6 % — AB (ref 12.3–15.4)
WBC: 6.2 10*3/uL (ref 3.4–10.8)

## 2015-04-20 LAB — BASIC METABOLIC PANEL
BUN/Creatinine Ratio: 25 — ABNORMAL HIGH (ref 10–22)
BUN: 26 mg/dL (ref 10–36)
CO2: 23 mmol/L (ref 18–29)
Calcium: 9.7 mg/dL (ref 8.6–10.2)
Chloride: 103 mmol/L (ref 97–108)
Creatinine, Ser: 1.05 mg/dL (ref 0.76–1.27)
GFR, EST AFRICAN AMERICAN: 71 mL/min/{1.73_m2} (ref >59)
GFR, EST NON AFRICAN AMERICAN: 61 mL/min/{1.73_m2} (ref >59)
Glucose: 146 mg/dL — ABNORMAL HIGH (ref 65–99)
Potassium: 4.6 mmol/L (ref 3.5–5.2)
Sodium: 142 mmol/L (ref 134–144)

## 2015-04-20 LAB — TSH: TSH: 0.53 u[IU]/mL (ref 0.450–4.500)

## 2015-04-24 ENCOUNTER — Encounter: Payer: Self-pay | Admitting: Nurse Practitioner

## 2015-04-24 ENCOUNTER — Ambulatory Visit (INDEPENDENT_AMBULATORY_CARE_PROVIDER_SITE_OTHER): Payer: Medicare Other | Admitting: Nurse Practitioner

## 2015-04-24 VITALS — BP 118/76 | HR 84 | Temp 97.6°F | Resp 14 | Ht 63.0 in | Wt 154.0 lb

## 2015-04-24 DIAGNOSIS — I482 Chronic atrial fibrillation, unspecified: Secondary | ICD-10-CM

## 2015-04-24 DIAGNOSIS — K120 Recurrent oral aphthae: Secondary | ICD-10-CM

## 2015-04-24 DIAGNOSIS — M170 Bilateral primary osteoarthritis of knee: Secondary | ICD-10-CM | POA: Diagnosis not present

## 2015-04-24 DIAGNOSIS — N183 Chronic kidney disease, stage 3 (moderate): Secondary | ICD-10-CM

## 2015-04-24 DIAGNOSIS — I5030 Unspecified diastolic (congestive) heart failure: Secondary | ICD-10-CM

## 2015-04-24 DIAGNOSIS — Z23 Encounter for immunization: Secondary | ICD-10-CM

## 2015-04-24 DIAGNOSIS — I10 Essential (primary) hypertension: Secondary | ICD-10-CM | POA: Diagnosis not present

## 2015-04-24 DIAGNOSIS — E1122 Type 2 diabetes mellitus with diabetic chronic kidney disease: Secondary | ICD-10-CM | POA: Diagnosis not present

## 2015-04-24 MED ORDER — METFORMIN HCL 500 MG PO TABS: 500.0000 mg | ORAL_TABLET | Freq: Every day | ORAL | Status: DC

## 2015-04-24 NOTE — Progress Notes (Signed)
Patient ID: Logan Bean, male   DOB: [birth year], 81 y.o.   MRN: [id number]    PCP: Lauree Chandler, NP  Advanced Directive information Does patient have an advance directive?: Yes  Allergies  Allergen Reactions  . Morphine And Related Nausea And Vomiting  . Oxycodone Nausea And Vomiting    Chief Complaint  Patient presents with  . Medical Management of Chronic Issues    4 month follow-up, discuss labs (copy printed). Foot exam due   . Immunizations    Flu vaccine      HPI: Patient is a 81 y.o. male seen in the office today routine follow up of chronic conditions. pt with history of diabetes, chf, afib, and ckd, OA of bilateral knees. Pt following with cardiology for a fib,  following with podiatry every 3 months.  Last eye exam in august 2016.  A1c is worse, diet not as good as it should be due to living alone. Taking metformin every other day. Checks blood sugars which are 120 but does not have blood sugar log.  Eating 3 meals a day.   Reports roof of his mouth is sore. Does not bother him all the time but is touchy, been going on for 7-10 days.   Review of Systems:  Review of Systems  Constitutional: Negative for activity change, appetite change, fatigue and unexpected weight change.  HENT: Positive for hearing loss. Negative for congestion.        Mouth sore  Eyes: Negative.   Respiratory: Negative for cough and shortness of breath.   Cardiovascular: Negative for chest pain, palpitations and leg swelling.  Gastrointestinal: Negative for abdominal pain, diarrhea and constipation.  Genitourinary: Negative for dysuria and difficulty urinating.  Musculoskeletal: Positive for arthralgias and gait problem. Negative for myalgias.       Bilateral knee pain- had injections today, uses walking stick   Skin: Negative for color change and wound.  Neurological: Negative for dizziness and weakness.  Psychiatric/Behavioral: Negative for behavioral problems, confusion and  agitation.    Past Medical History  Diagnosis Date  . Hypertension   . Hypercholesteremia   . Stomach ulcer     years  . H/O hiatal hernia   . Varicose veins     both legs  . Arthritis     left knee  . Diabetes mellitus     diet controlled, pt does not check cbg  . Left rotator cuff tear Jul 12, 2012  . Edema   . Unspecified hereditary and idiopathic peripheral neuropathy   . Dizziness and giddiness   . Unspecified vitamin D deficiency   . Diaphragmatic hernia without mention of obstruction or gangrene    Past Surgical History  Procedure Laterality Date  . Esopagus strectching  2003 and 3 yrs ago  . Tonsillectomy  age 78  . Shoulder open rotator cuff repair  11/05/2011    Procedure: ROTATOR CUFF REPAIR SHOULDER OPEN;  Surgeon: Tobi Bastos, MD;  Location: [facility];  Service: Orthopedics;  Laterality: Left;  Left Shoulder Rotator Cuff Repair Open with Graft and Anchors  . Reverse shoulder arthroplasty Right 09/17/2012    Procedure: REVERSE SHOULDER ARTHROPLASTY;  Surgeon: Augustin Schooling, MD;  Location: [place];  Service: Orthopedics;  Laterality: Right;  . Orif ankle fracture Right 09/20/2012    Procedure: OPEN REDUCTION INTERNAL FIXATION (ORIF) ANKLE FRACTURE;  Surgeon: Johnn Hai, MD;  Location: [facility];  Service: Orthopedics;  Laterality: Right;  . Mastectomy  bilateral  . Cardioversion N/A 02/22/2013    Procedure: CARDIOVERSION;  Surgeon: Darlin Coco, MD;  Location: [facility];  Service: Cardiovascular;  Laterality: N/A;   Social History:   reports that he quit smoking about 18 years ago. His smoking use included Pipe and Cigarettes. He quit after 50 years of use. He has never used smokeless tobacco. He reports that he drinks alcohol. He reports that he does not use illicit drugs.  Family History  Problem Relation Age of Onset  . Parkinson's disease Mother   . Hip fracture Mother   . Heart disease Mother   . Alzheimer's disease Mother   . Heart disease  Father   . Parkinson's disease Sister   . Heart disease Brother     Medications: Patient's Medications  New Prescriptions   No medications on file  Previous Medications   ACETAMINOPHEN (TYLENOL) 650 MG CR TABLET    Take 1,300 mg by mouth daily as needed (joint pain).    CALCIUM CARBONATE-VITAMIN D 600-200 MG-UNIT TABS    Take by mouth 2 (two) times daily. Take 1 tablet daily for vitamin supplement.   ELIQUIS 5 MG TABS TABLET    TAKE 1 TABLET TWICE DAILY.   FUROSEMIDE (LASIX) 20 MG TABLET    1/2 by mouth as needed for swelling and edema   GLUCOSAMINE-CHONDROIT-VIT C-MN (GLUCOSAMINE 1500 COMPLEX) CAPS    Take one tablet once a day   GLUCOSE BLOOD TEST STRIP    True Test Glucose Test Strips Check blood sugar once daily Dx: E11.9   GLUCOSE MONITORING KIT (FREESTYLE) MONITORING KIT    1 each by Does not apply route as needed for other. Take blood sugar once daily.   LISINOPRIL (PRINIVIL,ZESTRIL) 20 MG TABLET    TAKE 1 TABLET DAILY FOR BLOOD PRESSURE.   METFORMIN (GLUCOPHAGE) 500 MG TABLET    TAKE ONE TABLET EVERY OTHER DAY.   METOPROLOL TARTRATE (LOPRESSOR) 25 MG TABLET    TAKE 1 TABLET TWICE DAILY.   MULTIPLE VITAMIN (MULITIVITAMIN WITH MINERALS) TABS    Take 1 tablet by mouth daily. Take 1 tablet daily for vitamin supplement.   OMEGA-3 FATTY ACIDS (FISH OIL) 1000 MG CAPS    Take 1,000 mg by mouth once.   SIMVASTATIN (ZOCOR) 10 MG TABLET    TAKE ONE TABLET AT BEDTIME.   TRUEPLUS LANCETS 30G MISC    Use to check blood sugar once daily. Dx E11.9  Modified Medications   No medications on file  Discontinued Medications   FUROSEMIDE (LASIX) 20 MG TABLET    TAKE (1/2) TABLET ONCE A DAY FOR EDEMA, HYPERTENSION.     Physical Exam:  Filed Vitals:   04/24/15 1507  BP: 118/76  Pulse: 84  Temp: 97.6 F (36.4 C)  TempSrc: Oral  Resp: 14  Height: _0  (1.6 m)  Weight: 154 lb (69.854 kg)  SpO2: 98%   Body mass index is 27.29 kg/(m^2).  Physical Exam  Constitutional: He is oriented to  person, place, and time. He appears well-nourished. No distress.  Frail elderly man  HENT:  Head: Normocephalic and atraumatic.  Mouth/Throat: Oropharynx is clear and moist. No oropharyngeal exudate.  Small white circular ulcer to hard palate   Eyes: Conjunctivae and EOM are normal. Pupils are equal, round, and reactive to light.  Neck: Normal range of motion. Neck supple.  Cardiovascular: Normal rate and normal heart sounds.  An irregularly irregular rhythm present.  Pulmonary/Chest: Effort normal and breath sounds normal.  Abdominal: Soft. Bowel  sounds are normal.  Musculoskeletal: He exhibits no edema or tenderness.  Neurological: He is alert and oriented to person, place, and time.  Skin: Skin is warm and dry. He is not diaphoretic.  Psychiatric: He has a normal mood and affect.    Labs reviewed: Basic Metabolic Panel:  Recent Labs  05/19/14 0500 07/05/14 1142 12/08/14 1102 04/19/15 1047  NA 137  --  137 142  K 4.7  --  5.3* 4.6  CL 100  --  100 103  CO2 23  --  23 23  GLUCOSE 257*  --  202* 146*  BUN 37*  --  43* 26  CREATININE 1.02  --  1.15 1.05  CALCIUM 10.0  --  9.5 9.7  TSH  --  0.479 0.399* 0.530   Liver Function Tests:  Recent Labs  05/11/14 1330 05/18/14 1940 12/08/14 1102  AST _0 ALT _1 ALKPHOS 99 114 94  BILITOT 0.7 0.9 0.7  PROT 6.1 7.3 6.2  ALBUMIN 4.1 3.9 4.1   No results for input(s): LIPASE, AMYLASE in the last 8760 hours. No results for input(s): AMMONIA in the last 8760 hours. CBC:  Recent Labs  05/18/14 1940 05/19/14 0500 05/30/14 1629 12/08/14 1102 04/19/15 1047  WBC 10.7* 8.4 9.6 11.5* 6.2  NEUTROABS 9.9*  --   --  10.0* 3.8  HGB 14.1 12.5* 12.4*  --   --   HCT 44.3 39.0 38.0 39.1 43.2  MCV 85.4 84.2 85  --   --   PLT 205 208 247  --   --    Lipid Panel:  Recent Labs  05/11/14 1330 12/08/14 1102  CHOL 157 168  HDL 43 53  LDLCALC 90 100*  TRIG 120 73  CHOLHDL 3.7 3.2   TSH:  Recent Labs   07/05/14 1142 12/08/14 1102 04/19/15 1047  TSH 0.479 0.399* 0.530   A1C: Lab Results  Component Value Date   HGBA1C 7.7* 04/19/2015     Assessment/Plan 1. Encounter for immunization -flu vaccine given  2. Type 2 diabetes mellitus with stage 3 chronic kidney disease, without long-term current use of insulin (HCC) -a1c worse and poor diet due to living alone. -will increase metformin 500 mg daily -to check blood sugars daily and bring log to follow up - metFORMIN (GLUCOPHAGE) 500 MG tablet; Take 1 tablet (500 mg total) by mouth daily with breakfast.  Dispense: 30 tablet; Refill: 3 -up to date on foot exam with podiatrist and eye exam -conts on acei  3. Essential hypertension Blood pressure stable.   4. Chronic atrial fibrillation (HCC) Rate controlled, cont on eliquis BID for anticoagulation   5. Diastolic congestive heart failure, unspecified congestive heart failure chronicity (HCC) Euvolemic, labs stable and reviewed with pt.  Cont on metoprolol and lasix   6. Aphthous ulcer To hard palate, will monitor -to follow up in 2 weeks to reevaluate ulcer and make sure it is healing appropriately   Follow up in 2 week on ulcer and diabetes  Lavell Supple K. Harle Battiest  [facility] [phone] 8 am - 5 pm) [phone] (after hours)

## 2015-04-24 NOTE — Patient Instructions (Signed)
Increase metformin to 500 mg once daily Take blood sugars in the morning and bring readings with you to next visit  Will monitor ulcer to top of mouth  Follow up in 2 weeks on diabetes and mouth sore

## 2015-04-26 ENCOUNTER — Emergency Department (HOSPITAL_COMMUNITY): Payer: Medicare Other

## 2015-04-26 ENCOUNTER — Encounter (HOSPITAL_COMMUNITY): Payer: Self-pay | Admitting: Emergency Medicine

## 2015-04-26 ENCOUNTER — Observation Stay (HOSPITAL_COMMUNITY)
Admission: EM | Admit: 2015-04-26 | Discharge: 2015-05-01 | Disposition: A | Discharge status: Home / Self Care | Payer: Medicare Other | Attending: Family Medicine | Admitting: Family Medicine

## 2015-04-26 DIAGNOSIS — S0093XA Contusion of unspecified part of head, initial encounter: Secondary | ICD-10-CM | POA: Diagnosis not present

## 2015-04-26 DIAGNOSIS — E119 Type 2 diabetes mellitus without complications: Secondary | ICD-10-CM | POA: Insufficient documentation

## 2015-04-26 DIAGNOSIS — S0990XA Unspecified injury of head, initial encounter: Secondary | ICD-10-CM | POA: Diagnosis present

## 2015-04-26 DIAGNOSIS — M542 Cervicalgia: Secondary | ICD-10-CM | POA: Diagnosis not present

## 2015-04-26 DIAGNOSIS — I868 Varicose veins of other specified sites: Secondary | ICD-10-CM | POA: Insufficient documentation

## 2015-04-26 DIAGNOSIS — G609 Hereditary and idiopathic neuropathy, unspecified: Secondary | ICD-10-CM | POA: Insufficient documentation

## 2015-04-26 DIAGNOSIS — S0083XA Contusion of other part of head, initial encounter: Principal | ICD-10-CM | POA: Insufficient documentation

## 2015-04-26 DIAGNOSIS — K449 Diaphragmatic hernia without obstruction or gangrene: Secondary | ICD-10-CM | POA: Diagnosis not present

## 2015-04-26 DIAGNOSIS — K259 Gastric ulcer, unspecified as acute or chronic, without hemorrhage or perforation: Secondary | ICD-10-CM | POA: Insufficient documentation

## 2015-04-26 DIAGNOSIS — S79911A Unspecified injury of right hip, initial encounter: Secondary | ICD-10-CM | POA: Diagnosis not present

## 2015-04-26 DIAGNOSIS — E7800 Pure hypercholesterolemia, unspecified: Secondary | ICD-10-CM | POA: Insufficient documentation

## 2015-04-26 DIAGNOSIS — Z79899 Other long term (current) drug therapy: Secondary | ICD-10-CM | POA: Insufficient documentation

## 2015-04-26 DIAGNOSIS — Y9289 Other specified places as the place of occurrence of the external cause: Secondary | ICD-10-CM | POA: Insufficient documentation

## 2015-04-26 DIAGNOSIS — E559 Vitamin D deficiency, unspecified: Secondary | ICD-10-CM | POA: Diagnosis not present

## 2015-04-26 DIAGNOSIS — Y9389 Activity, other specified: Secondary | ICD-10-CM | POA: Insufficient documentation

## 2015-04-26 DIAGNOSIS — M75102 Unspecified rotator cuff tear or rupture of left shoulder, not specified as traumatic: Secondary | ICD-10-CM | POA: Insufficient documentation

## 2015-04-26 DIAGNOSIS — M1990 Unspecified osteoarthritis, unspecified site: Secondary | ICD-10-CM | POA: Diagnosis not present

## 2015-04-26 DIAGNOSIS — M25552 Pain in left hip: Secondary | ICD-10-CM | POA: Diagnosis not present

## 2015-04-26 DIAGNOSIS — S298XXA Other specified injuries of thorax, initial encounter: Secondary | ICD-10-CM | POA: Diagnosis not present

## 2015-04-26 DIAGNOSIS — M79601 Pain in right arm: Secondary | ICD-10-CM | POA: Diagnosis not present

## 2015-04-26 DIAGNOSIS — S8002XA Contusion of left knee, initial encounter: Secondary | ICD-10-CM | POA: Diagnosis not present

## 2015-04-26 DIAGNOSIS — E1165 Type 2 diabetes mellitus with hyperglycemia: Secondary | ICD-10-CM | POA: Diagnosis present

## 2015-04-26 DIAGNOSIS — M25562 Pain in left knee: Secondary | ICD-10-CM | POA: Diagnosis not present

## 2015-04-26 DIAGNOSIS — S40011A Contusion of right shoulder, initial encounter: Secondary | ICD-10-CM | POA: Diagnosis not present

## 2015-04-26 DIAGNOSIS — T07XXXA Unspecified multiple injuries, initial encounter: Secondary | ICD-10-CM

## 2015-04-26 DIAGNOSIS — S098XXA Other specified injuries of head, initial encounter: Secondary | ICD-10-CM | POA: Diagnosis not present

## 2015-04-26 DIAGNOSIS — W19XXXA Unspecified fall, initial encounter: Secondary | ICD-10-CM

## 2015-04-26 DIAGNOSIS — Z87891 Personal history of nicotine dependence: Secondary | ICD-10-CM | POA: Insufficient documentation

## 2015-04-26 DIAGNOSIS — Y998 Other external cause status: Secondary | ICD-10-CM | POA: Insufficient documentation

## 2015-04-26 DIAGNOSIS — W01198A Fall on same level from slipping, tripping and stumbling with subsequent striking against other object, initial encounter: Secondary | ICD-10-CM | POA: Insufficient documentation

## 2015-04-26 DIAGNOSIS — I482 Chronic atrial fibrillation, unspecified: Secondary | ICD-10-CM | POA: Diagnosis present

## 2015-04-26 DIAGNOSIS — S0191XA Laceration without foreign body of unspecified part of head, initial encounter: Secondary | ICD-10-CM | POA: Diagnosis not present

## 2015-04-26 DIAGNOSIS — S59911A Unspecified injury of right forearm, initial encounter: Secondary | ICD-10-CM | POA: Insufficient documentation

## 2015-04-26 DIAGNOSIS — S40021A Contusion of right upper arm, initial encounter: Secondary | ICD-10-CM | POA: Diagnosis not present

## 2015-04-26 DIAGNOSIS — R55 Syncope and collapse: Secondary | ICD-10-CM | POA: Diagnosis present

## 2015-04-26 DIAGNOSIS — I10 Essential (primary) hypertension: Secondary | ICD-10-CM | POA: Diagnosis present

## 2015-04-26 LAB — URINALYSIS, ROUTINE W REFLEX MICROSCOPIC
BILIRUBIN URINE: NEGATIVE
Glucose, UA: 250 mg/dL — AB
Ketones, ur: NEGATIVE mg/dL
LEUKOCYTES UA: NEGATIVE
NITRITE: NEGATIVE
Protein, ur: NEGATIVE mg/dL
SPECIFIC GRAVITY, URINE: 1.018 (ref 1.005–1.030)
UROBILINOGEN UA: 0.2 mg/dL (ref 0.0–1.0)
pH: 6 (ref 5.0–8.0)

## 2015-04-26 LAB — URINE MICROSCOPIC-ADD ON

## 2015-04-26 LAB — BASIC METABOLIC PANEL
ANION GAP: 11 (ref 5–15)
BUN: 41 mg/dL — AB (ref 6–20)
CHLORIDE: 98 mmol/L — AB (ref 101–111)
CO2: 24 mmol/L (ref 22–32)
Calcium: 9.9 mg/dL (ref 8.9–10.3)
Creatinine, Ser: 1.13 mg/dL (ref 0.61–1.24)
GFR calc Af Amer: >60 mL/min (ref >60)
GFR, EST NON AFRICAN AMERICAN: 54 mL/min — AB (ref >60)
GLUCOSE: 259 mg/dL — AB (ref 65–99)
POTASSIUM: 4.8 mmol/L (ref 3.5–5.1)
SODIUM: 133 mmol/L — AB (ref 135–145)

## 2015-04-26 LAB — CBC WITH DIFFERENTIAL/PLATELET
BASOS PCT: 0 %
Basophils Absolute: 0 10*3/uL (ref 0.0–0.1)
EOS ABS: 0 10*3/uL (ref 0.0–0.7)
Eosinophils Relative: 0 %
HCT: 42.4 % (ref 39.0–52.0)
HEMOGLOBIN: 13.9 g/dL (ref 13.0–17.0)
LYMPHS ABS: 0.6 10*3/uL — AB (ref 0.7–4.0)
Lymphocytes Relative: 4 %
MCH: 27.4 pg (ref 26.0–34.0)
MCHC: 32.8 g/dL (ref 30.0–36.0)
MCV: 83.5 fL (ref 78.0–100.0)
MONO ABS: 0.9 10*3/uL (ref 0.1–1.0)
MONOS PCT: 6 %
NEUTROS PCT: 90 %
Neutro Abs: 13.5 10*3/uL — ABNORMAL HIGH (ref 1.7–7.7)
Platelets: 191 10*3/uL (ref 150–400)
RBC: 5.08 MIL/uL (ref 4.22–5.81)
RDW: 17.2 % — ABNORMAL HIGH (ref 11.5–15.5)
WBC: 15 10*3/uL — ABNORMAL HIGH (ref 4.0–10.5)

## 2015-04-26 LAB — GLUCOSE, CAPILLARY: Glucose-Capillary: 276 mg/dL — ABNORMAL HIGH (ref 65–99)

## 2015-04-26 LAB — TROPONIN I: Troponin I: <0.03 ng/mL (ref <0.031)

## 2015-04-26 MED ORDER — SODIUM CHLORIDE 0.9 % IV SOLN
INTRAVENOUS | Status: DC
  Administered 2015-04-26: via INTRAVENOUS

## 2015-04-26 MED ORDER — ONDANSETRON HCL 4 MG/2ML IJ SOLN: 4.0000 mg | Freq: Three times a day (TID) | INTRAMUSCULAR | Status: DC | PRN

## 2015-04-26 NOTE — ED Notes (Addendum)
Pt was at his home and started feeling "fuzzy headed" and became very weak. Pt attempted to make it to counter to grab onto and fell hitting his head on the tile floor. Pt knocked out a tooth ,has laceration on bottom lip. Pt also has a hematoma above right eye and large bruise to right upper arm. Pt takes eliquis for hx of afib. Pt is alert and ox4.

## 2015-04-26 NOTE — ED Provider Notes (Signed)
CSN: [id number]     Arrival date & time 04/26/15  1832 History   First MD Initiated Contact with Patient 04/26/15 1841     Chief Complaint  Patient presents with  . Fall     (Consider location/radiation/quality/duration/timing/severity/associated sxs/prior Treatment) HPI Comments: Pt comes in with c/o feeling fuzzy headed after over doing it today and he feel and hit his head on the counter when he fell. Denies cp or sob. He states that he was on the ground for 15 minutes and was able to get up with the help of ems. He is on a blood thinner. He is having some right hip, left knee, right arm and head pain. Denies loc with fall. He states that he also had a tooth come out and has a cut to his mouth. Denies neck or back pain. Denies numbness or weakness  The history is provided by the patient. No language interpreter was used.    Past Medical History  Diagnosis Date  . Hypertension   . Hypercholesteremia   . Stomach ulcer     years  . H/O hiatal hernia   . Varicose veins     both legs  . Arthritis     left knee  . Diabetes mellitus     diet controlled, pt does not check cbg  . Left rotator cuff tear Jul 12, 2012  . Edema   . Unspecified hereditary and idiopathic peripheral neuropathy   . Dizziness and giddiness   . Unspecified vitamin D deficiency   . Diaphragmatic hernia without mention of obstruction or gangrene    Past Surgical History  Procedure Laterality Date  . Esopagus strectching  2003 and 3 yrs ago  . Tonsillectomy  age 57  . Shoulder open rotator cuff repair  11/05/2011    Procedure: ROTATOR CUFF REPAIR SHOULDER OPEN;  Surgeon: Tobi Bastos, MD;  Location: [facility];  Service: Orthopedics;  Laterality: Left;  Left Shoulder Rotator Cuff Repair Open with Graft and Anchors  . Reverse shoulder arthroplasty Right 09/17/2012    Procedure: REVERSE SHOULDER ARTHROPLASTY;  Surgeon: Augustin Schooling, MD;  Location: [place];  Service: Orthopedics;  Laterality: Right;  . Orif ankle  fracture Right 09/20/2012    Procedure: OPEN REDUCTION INTERNAL FIXATION (ORIF) ANKLE FRACTURE;  Surgeon: Johnn Hai, MD;  Location: [facility];  Service: Orthopedics;  Laterality: Right;  . Mastectomy      bilateral  . Cardioversion N/A 02/22/2013    Procedure: CARDIOVERSION;  Surgeon: Darlin Coco, MD;  Location: [facility] ENDOSCOPY;  Service: Cardiovascular;  Laterality: N/A;   Family History  Problem Relation Age of Onset  . Parkinson's disease Mother   . Hip fracture Mother   . Heart disease Mother   . Alzheimer's disease Mother   . Heart disease Father   . Parkinson's disease Sister   . Heart disease Brother    Social History  Substance Use Topics  . Smoking status: Former Smoker -- 48 years    Types: Pipe, Cigarettes    Quit date: 07/14/1996  . Smokeless tobacco: Never Used  . Alcohol Use: Yes     Comment: occasional beer    Review of Systems  All other systems reviewed and are negative.     Allergies  Morphine and related and Oxycodone  Home Medications   Prior to Admission medications   Medication Sig Start Date End Date Taking? Authorizing Provider  acetaminophen (TYLENOL) 650 MG CR tablet Take 1,300 mg by mouth daily as  needed (joint pain).     Historical Provider, MD  Calcium Carbonate-Vitamin D 600-200 MG-UNIT TABS Take by mouth 2 (two) times daily. Take 1 tablet daily for vitamin supplement.    Historical Provider, MD  ELIQUIS 5 MG TABS tablet TAKE 1 TABLET TWICE DAILY. 03/26/15   Lauree Chandler, NP  furosemide (LASIX) 20 MG tablet 1/2 by mouth as needed for swelling and edema    Historical Provider, MD  Glucosamine-Chondroit-Vit C-Mn (GLUCOSAMINE 1500 COMPLEX) CAPS Take one tablet once a day 01/12/13   Blanchie Serve, MD  glucose blood test strip True Test Glucose Test Strips Check blood sugar once daily Dx: E11.9 05/01/14   Tiffany L Reed, DO  glucose monitoring kit (FREESTYLE) monitoring kit 1 each by Does not apply route as needed for other. Take blood sugar  once daily. 01/19/13   Blanchie Serve, MD  lisinopril (PRINIVIL,ZESTRIL) 20 MG tablet TAKE 1 TABLET DAILY FOR BLOOD PRESSURE. 11/21/14   Lauree Chandler, NP  metFORMIN (GLUCOPHAGE) 500 MG tablet Take 1 tablet (500 mg total) by mouth daily with breakfast. 04/24/15   Lauree Chandler, NP  metoprolol tartrate (LOPRESSOR) 25 MG tablet TAKE 1 TABLET TWICE DAILY. 02/26/15   Erlene Quan, PA-C  Multiple Vitamin (MULITIVITAMIN WITH MINERALS) TABS Take 1 tablet by mouth daily. Take 1 tablet daily for vitamin supplement.    Historical Provider, MD  Omega-3 Fatty Acids (FISH OIL) 1000 MG CAPS Take 1,000 mg by mouth once.    Historical Provider, MD  simvastatin (ZOCOR) 10 MG tablet TAKE ONE TABLET AT BEDTIME. 11/21/14   Lauree Chandler, NP  TRUEPLUS LANCETS 30G MISC Use to check blood sugar once daily. Dx E11.9 05/01/14   Tiffany L Reed, DO   BP 172/112 mmHg  Pulse 117  Temp(Src) 97.9 F (36.6 C) (Oral)  Resp 16  Ht _0  (1.702 m)  Wt 154 lb (69.854 kg)  BMI 24.11 kg/m2  SpO2 98% Physical Exam  Constitutional: He appears well-developed and well-nourished.  HENT:  Hematoma noted to the right forehead. Front left tooth gone. Laceration noted to the the lower mucosa  Eyes: Conjunctivae and EOM are normal. Pupils are equal, round, and reactive to light.  Neck: Neck supple.  Pulmonary/Chest: Effort normal and breath sounds normal.  Musculoskeletal:       Cervical back: Normal.       Thoracic back: Normal.       Lumbar back: Normal.  Tender in the right hip without shortening or rotation. Tender in the left knee. Bruising and swelling noted to the right mid shaft of the humerus.  Neurological: He is alert.  Skin:  Multiple abrasions and contusion noted to extremity  Nursing note reviewed.   ED Course  Procedures (including critical care time) Labs Review Labs Reviewed  CBC WITH DIFFERENTIAL/PLATELET  BASIC METABOLIC PANEL  TROPONIN I  URINALYSIS, ROUTINE W REFLEX MICROSCOPIC (NOT AT [facility])     Imaging Review Dg Chest 2 View  04/26/2015  CLINICAL DATA:  Status post fall. EXAM: CHEST  2 VIEW COMPARISON:  May 19, 2014 FINDINGS: The heart size and mediastinal contours are stable. The heart size is enlarged. There is no focal infiltrate, pulmonary edema, or pleural effusion. There is a question 1.7 cm nodule in the medial right upper lobe. The visualized skeletal structures are stable. IMPRESSION: No active cardiopulmonary disease. Question 1.7 nodule in the medial right upper lobe. Further evaluation with chest CT on outpatient basis is recommended. Electronically Signed  By: Abelardo Diesel M.D.   On: 04/26/2015 20:03   Ct Head Wo Contrast  04/26/2015  CLINICAL DATA:  81 year old male who fell. Lacerations, hematoma, teeth knocked out, neck pain. Initial encounter. EXAM: CT HEAD WITHOUT CONTRAST CT CERVICAL SPINE WITHOUT CONTRAST TECHNIQUE: Multidetector CT imaging of the head and cervical spine was performed following the standard protocol without intravenous contrast. Multiplanar CT image reconstructions of the cervical spine were also generated. COMPARISON:  05/18/2014. FINDINGS: CT HEAD FINDINGS Visualized paranasal sinuses and mastoids are clear. Right forehead scalp hematoma measuring up to 12 mm in thickness. Underlying right frontal bone intact. Hyperostosis frontalis No acute osseous abnormality identified. Calcified atherosclerosis at the skull base. Cerebral volume has mildly diminished since 2015. No ventriculomegaly. Asymmetric CSF along the left frontal convexity is incidentally noted and stable, perhaps a small hygroma but appears inconsequential. No midline shift or intracranial mass effect. Heterogeneous hypodensity in the deep gray matter nuclei compatible with chronic lacunar infarcts appear stable. No acute intracranial hemorrhage identified. No cortically based acute infarct identified. No suspicious intracranial vascular hyperdensity. CT CERVICAL SPINE FINDINGS  Osteopenia. Chronic multilevel mild spondylolisthesis in the cervical spine most pronounced at C3-C4 (retrolisthesis). Widespread disc space loss. Visualized skull base is intact. No atlanto-occipital dissociation. Stable cervicothoracic junction alignment. Stable bilateral posterior element alignment. No cervical spine fracture identified. Negative lung apices. Grossly intact visualized upper thoracic levels. Chronic multifactorial degenerative spinal stenosis at C3-C4. Thyromegaly on the right, no discrete thyroid nodule. Mild for age calcified atherosclerosis. Otherwise negative noncontrast paraspinal soft tissues. IMPRESSION: 1. Forehead soft tissue injury without underlying fracture. 2. No acute intracranial abnormality. Chronic small vessel disease. 3. No acute fracture or listhesis identified in the cervical spine. Ligamentous injury is not excluded. 4. Chronic cervical spine spondylolisthesis with degenerative C3-C4 spinal stenosis. Electronically Signed   By: Genevie Ann M.D.   On: 04/26/2015 19:34   Ct Cervical Spine Wo Contrast  04/26/2015  CLINICAL DATA:  81 year old male who fell. Lacerations, hematoma, teeth knocked out, neck pain. Initial encounter. EXAM: CT HEAD WITHOUT CONTRAST CT CERVICAL SPINE WITHOUT CONTRAST TECHNIQUE: Multidetector CT imaging of the head and cervical spine was performed following the standard protocol without intravenous contrast. Multiplanar CT image reconstructions of the cervical spine were also generated. COMPARISON:  05/18/2014. FINDINGS: CT HEAD FINDINGS Visualized paranasal sinuses and mastoids are clear. Right forehead scalp hematoma measuring up to 12 mm in thickness. Underlying right frontal bone intact. Hyperostosis frontalis No acute osseous abnormality identified. Calcified atherosclerosis at the skull base. Cerebral volume has mildly diminished since 2015. No ventriculomegaly. Asymmetric CSF along the left frontal convexity is incidentally noted and stable,  perhaps a small hygroma but appears inconsequential. No midline shift or intracranial mass effect. Heterogeneous hypodensity in the deep gray matter nuclei compatible with chronic lacunar infarcts appear stable. No acute intracranial hemorrhage identified. No cortically based acute infarct identified. No suspicious intracranial vascular hyperdensity. CT CERVICAL SPINE FINDINGS Osteopenia. Chronic multilevel mild spondylolisthesis in the cervical spine most pronounced at C3-C4 (retrolisthesis). Widespread disc space loss. Visualized skull base is intact. No atlanto-occipital dissociation. Stable cervicothoracic junction alignment. Stable bilateral posterior element alignment. No cervical spine fracture identified. Negative lung apices. Grossly intact visualized upper thoracic levels. Chronic multifactorial degenerative spinal stenosis at C3-C4. Thyromegaly on the right, no discrete thyroid nodule. Mild for age calcified atherosclerosis. Otherwise negative noncontrast paraspinal soft tissues. IMPRESSION: 1. Forehead soft tissue injury without underlying fracture. 2. No acute intracranial abnormality. Chronic small vessel disease. 3. No acute fracture or  listhesis identified in the cervical spine. Ligamentous injury is not excluded. 4. Chronic cervical spine spondylolisthesis with degenerative C3-C4 spinal stenosis. Electronically Signed   By: Genevie Ann M.D.   On: 04/26/2015 19:34   Dg Knee Complete 4 Views Left  04/26/2015  CLINICAL DATA:  81 year old male who fell at home. Pain. Initial encounter. EXAM: LEFT KNEE - COMPLETE 4+ VIEW COMPARISON:  05/18/2014. FINDINGS: Advanced tricompartmental degenerative changes in the left knee maximal in the lateral compartment. 2 cm soft tissue swelling anterior to the patella and joint space compatible with superficial hematoma. The patella appears stable and intact. Osteopenia. No acute fracture identified. Calcified peripheral vascular disease. IMPRESSION: Soft tissue injury.  Advanced degenerative changes in the left knee with no acute fracture or dislocation identified. Electronically Signed   By: Genevie Ann M.D.   On: 04/26/2015 20:07   Dg Humerus Right  04/26/2015  CLINICAL DATA:  81 year old male with acute right arm pain following fall today. Initial encounter. EXAM: RIGHT HUMERUS - 2+ VIEW COMPARISON:  09/20/2012 radiographs FINDINGS: Right shoulder arthroplasty again noted without evidence of subluxation or dislocation. No acute fracture or complicating hardware features noted. Soft tissue swelling is present. No focal bony lesions are identified. IMPRESSION: Soft tissue swelling without acute bony abnormality. Right shoulder arthroplasty without complicating features. Electronically Signed   By: Margarette Canada M.D.   On: 04/26/2015 20:04   Dg Hip Unilat With Pelvis 2-3 Views Right  04/26/2015  CLINICAL DATA:  81 year old male with acute left hip pain following fall today. Initial encounter. EXAM: DG HIP (WITH OR WITHOUT PELVIS) 2-3V RIGHT COMPARISON:  09/11/2012 radiographs FINDINGS: There is no evidence of hip fracture or dislocation. There is no evidence of arthropathy or other focal bone abnormality. IMPRESSION: Negative. Electronically Signed   By: Margarette Canada M.D.   On: 04/26/2015 20:07   I have personally reviewed and evaluated these images and lab results as part of my medical decision-making.   EKG Interpretation None      MDM   Final diagnoses:  None    Results pending. Pt left with Dr. Assunta Found, NP 04/26/15 2011  Noemi Chapel, MD 04/26/15 2205

## 2015-04-26 NOTE — Progress Notes (Signed)
Called ED RN for report. Room ready.  

## 2015-04-26 NOTE — ED Notes (Signed)
Patient allowed to eat at this time per Dr. Sabra Heck

## 2015-04-26 NOTE — ED Notes (Signed)
Patient off the unit for testing, currently in CT, transporter for xray informed.

## 2015-04-27 ENCOUNTER — Encounter (HOSPITAL_COMMUNITY): Payer: Self-pay | Admitting: *Deleted

## 2015-04-27 DIAGNOSIS — I10 Essential (primary) hypertension: Secondary | ICD-10-CM

## 2015-04-27 DIAGNOSIS — R55 Syncope and collapse: Secondary | ICD-10-CM | POA: Diagnosis not present

## 2015-04-27 DIAGNOSIS — I482 Chronic atrial fibrillation: Secondary | ICD-10-CM

## 2015-04-27 DIAGNOSIS — E1165 Type 2 diabetes mellitus with hyperglycemia: Secondary | ICD-10-CM | POA: Diagnosis present

## 2015-04-27 LAB — CBC
HEMATOCRIT: 37.7 % — AB (ref 39.0–52.0)
Hemoglobin: 12.3 g/dL — ABNORMAL LOW (ref 13.0–17.0)
MCH: 27.4 pg (ref 26.0–34.0)
MCHC: 32.6 g/dL (ref 30.0–36.0)
MCV: 84 fL (ref 78.0–100.0)
Platelets: 190 10*3/uL (ref 150–400)
RBC: 4.49 MIL/uL (ref 4.22–5.81)
RDW: 17.2 % — ABNORMAL HIGH (ref 11.5–15.5)
WBC: 13.9 10*3/uL — AB (ref 4.0–10.5)

## 2015-04-27 LAB — TSH: TSH: 0.156 u[IU]/mL — ABNORMAL LOW (ref 0.350–4.500)

## 2015-04-27 LAB — GLUCOSE, CAPILLARY
GLUCOSE-CAPILLARY: 228 mg/dL — AB (ref 65–99)
GLUCOSE-CAPILLARY: 205 mg/dL — AB (ref 65–99)
GLUCOSE-CAPILLARY: 255 mg/dL — AB (ref 65–99)
Glucose-Capillary: 229 mg/dL — ABNORMAL HIGH (ref 65–99)

## 2015-04-27 LAB — BASIC METABOLIC PANEL
Anion gap: 11 (ref 5–15)
BUN: 38 mg/dL — AB (ref 6–20)
CHLORIDE: 94 mmol/L — AB (ref 101–111)
CO2: 24 mmol/L (ref 22–32)
Calcium: 9.1 mg/dL (ref 8.9–10.3)
Creatinine, Ser: 1.17 mg/dL (ref 0.61–1.24)
GFR calc non Af Amer: 52 mL/min — ABNORMAL LOW (ref >60)
Glucose, Bld: 302 mg/dL — ABNORMAL HIGH (ref 65–99)
POTASSIUM: 4.6 mmol/L (ref 3.5–5.1)
SODIUM: 129 mmol/L — AB (ref 135–145)

## 2015-04-27 LAB — TYPE AND SCREEN
ABO/RH(D): O POS
Antibody Screen: NEGATIVE

## 2015-04-27 LAB — APTT
APTT: 59 s — AB (ref 24–37)
aPTT: 31 seconds (ref 24–37)
aPTT: 54 seconds — ABNORMAL HIGH (ref 24–37)

## 2015-04-27 LAB — CK: Total CK: 54 U/L (ref 49–397)

## 2015-04-27 LAB — HEPARIN LEVEL (UNFRACTIONATED): HEPARIN UNFRACTIONATED: 0.99 [IU]/mL — AB (ref 0.30–0.70)

## 2015-04-27 LAB — T4, FREE: FREE T4: 1.2 ng/dL — AB (ref 0.61–1.12)

## 2015-04-27 MED ORDER — ONDANSETRON HCL 4 MG PO TABS: 4.0000 mg | ORAL_TABLET | Freq: Four times a day (QID) | ORAL | Status: DC | PRN

## 2015-04-27 MED ORDER — HEPARIN (PORCINE) IN NACL 100-0.45 UNIT/ML-% IJ SOLN
1250.0000 [IU]/h | INTRAMUSCULAR | Status: DC
  Administered 2015-04-27: 1000 [IU]/h via INTRAVENOUS
  Filled 2015-04-27 (×2): qty 250

## 2015-04-27 MED ORDER — SIMVASTATIN 20 MG PO TABS
10.0000 mg | ORAL_TABLET | Freq: Every day | ORAL | Status: DC
  Administered 2015-04-27 – 2015-04-30 (×5): 10 mg via ORAL
  Filled 2015-04-27 (×5): qty 1

## 2015-04-27 MED ORDER — WHITE PETROLATUM GEL
Status: AC
  Administered 2015-04-27: 0.2
  Filled 2015-04-27: qty 1

## 2015-04-27 MED ORDER — ONDANSETRON HCL 4 MG/2ML IJ SOLN: 4.0000 mg | Freq: Four times a day (QID) | INTRAMUSCULAR | Status: DC | PRN

## 2015-04-27 MED ORDER — ACETAMINOPHEN 650 MG RE SUPP: 650.0000 mg | Freq: Four times a day (QID) | RECTAL | Status: DC | PRN

## 2015-04-27 MED ORDER — OMEGA-3-ACID ETHYL ESTERS 1 G PO CAPS
1000.0000 mg | ORAL_CAPSULE | Freq: Every day | ORAL | Status: DC
  Administered 2015-04-27 – 2015-05-01 (×5): 1000 mg via ORAL
  Filled 2015-04-27 (×5): qty 1

## 2015-04-27 MED ORDER — LISINOPRIL 20 MG PO TABS
20.0000 mg | ORAL_TABLET | Freq: Every day | ORAL | Status: DC
  Administered 2015-04-27 – 2015-05-01 (×5): 20 mg via ORAL
  Filled 2015-04-27 (×6): qty 1

## 2015-04-27 MED ORDER — METOPROLOL TARTRATE 25 MG PO TABS
25.0000 mg | ORAL_TABLET | Freq: Two times a day (BID) | ORAL | Status: DC
  Administered 2015-04-27 – 2015-05-01 (×10): 25 mg via ORAL
  Filled 2015-04-27 (×10): qty 1

## 2015-04-27 MED ORDER — TRAMADOL HCL 50 MG PO TABS
50.0000 mg | ORAL_TABLET | Freq: Four times a day (QID) | ORAL | Status: DC | PRN
  Administered 2015-04-28: 50 mg via ORAL
  Filled 2015-04-27 (×2): qty 1

## 2015-04-27 MED ORDER — SODIUM CHLORIDE 0.9 % IV SOLN: INTRAVENOUS | Status: DC

## 2015-04-27 MED ORDER — ACETAMINOPHEN 325 MG PO TABS
650.0000 mg | ORAL_TABLET | Freq: Four times a day (QID) | ORAL | Status: DC | PRN
  Administered 2015-04-28 – 2015-05-01 (×4): 650 mg via ORAL
  Filled 2015-04-27 (×4): qty 2

## 2015-04-27 MED ORDER — INSULIN ASPART 100 UNIT/ML ~~LOC~~ SOLN
0.0000 [IU] | Freq: Three times a day (TID) | SUBCUTANEOUS | Status: DC
  Administered 2015-04-27 (×3): 3 [IU] via SUBCUTANEOUS
  Administered 2015-04-28 – 2015-05-01 (×10): 2 [IU] via SUBCUTANEOUS

## 2015-04-27 NOTE — Evaluation (Signed)
Physical Therapy Evaluation Patient Details Name: Logan Bean MRN: [id number] DOB: Feb 23, 1921 Today's Date: 04/27/2015   History of Present Illness  Logan Bean is a 81 y.o. male with history of atrial fibrillation on Apixaban, hypertension, diabetes mellitus was brought to the ER after patient had a fall. Was in 1.  A. Fib - initially was in [facility].  Clinical Impression  Pt admitted with the above complications. Pt currently with functional limitations due to the deficits listed below (see PT Problem List). Previously independent with an assistive device. Today required close guard-min assist for mobility. Able to ambulate up to 60 feet with a rolling walker and single hand held assist. HR up to 127, denies any lightheadedness. Strong family support (POA present.) POA and POA's wife agree that they can provide adequate supervision for patient at discharge and already have multiple DME devices which will be appropriate in his current state. HHPT to follow-up for progression. Near baseline with mobility apparently, however states he feels weaker than normal. Pt will benefit from skilled PT to increase their independence and safety with mobility to allow discharge to the venue listed below.       Follow Up Recommendations Home health PT;Supervision for mobility/OOB    Equipment Recommendations  None recommended by PT    Recommendations for Other Services OT consult     Precautions / Restrictions Precautions Precautions: Fall Restrictions Weight Bearing Restrictions: No      Mobility  Bed Mobility Overal bed mobility: Modified Independent             General bed mobility comments: Effortful, requires extra time but able to perform without physical assist.  Transfers Overall transfer level: Needs assistance Equipment used: Rolling walker (2 wheeled) Transfers: Sit to/from Stand Sit to Stand: Min guard         General transfer comment: Close guard assist when performed  from bed, recliner, and BSC. Leans forward excessively before reaching for RW for UE support. VC for technique and hand placement.  Ambulation/Gait Ambulation/Gait assistance: Min assist Ambulation Distance (Feet): 60 Feet Assistive device: Rolling walker (2 wheeled);1 person hand held assist Gait Pattern/deviations: Step-through pattern;Decreased stride length;Antalgic;Wide base of support;Trunk flexed Gait velocity: decreased   General Gait Details: Frequent VC for upright posture. Began distance with RW after education on proper use. Progressed to single UE support with hand held assist to simulate walking stick use. Pt and POA report pt appears to be ambulating near his baseline. No buckling noted. Reported no lightheadedness or dizziness. HR elevated to 127 while ambulating, up from 90s at rest.  Stairs            Wheelchair Mobility    Modified Rankin (Stroke Patients Only)       Balance Overall balance assessment: Needs assistance;History of Falls Sitting-balance support: No upper extremity supported Sitting balance-Leahy Scale: Good     Standing balance support: No upper extremity supported Standing balance-Leahy Scale: Fair                               Pertinent Vitals/Pain Pain Assessment: No/denies pain    Home Living Family/patient expects to be discharged to:: Private residence Living Arrangements: Children Available Help at Discharge: Family;Available PRN/intermittently Type of Home: House Home Access: Stairs to enter   Entrance Stairs-Number of Steps: 1+1 Home Layout: One level Home Equipment: Walker - 2 wheels;Walker - 4 wheels;Bedside commode;Shower seat;Grab bars - toilet;Grab bars - tub/shower (walking stick)  Additional Comments: Spoke with POA and the POA's wife. State they can provide supervision for all OOB mobility.    Prior Function Level of Independence: Independent with assistive device(s)         Comments: uses walking  stick outside     Hand Dominance        Extremity/Trunk Assessment   Upper Extremity Assessment: Defer to OT evaluation           Lower Extremity Assessment: Generalized weakness;LLE deficits/detail (reports BIL knee OA)   LLE Deficits / Details: Edema of left knee with ecchymosis     Communication   Communication: No difficulties  Cognition Arousal/Alertness: Awake/alert Behavior During Therapy: WFL for tasks assessed/performed Overall Cognitive Status: Within Functional Limits for tasks assessed                      General Comments General comments (skin integrity, edema, etc.): POA present, very supportive. Discussed amount of supervision needed for patient safety at home and POA agrees this can be provided. A lot of time spent discussing with POA and his wife, including the patient concerning his self care and safety with mobility at home.    Exercises        Assessment/Plan    PT Assessment Patient needs continued PT services  PT Diagnosis Difficulty walking;Abnormality of gait;Generalized weakness   PT Problem List Decreased strength;Decreased range of motion;Decreased activity tolerance;Decreased balance;Decreased mobility;Decreased knowledge of use of DME;Cardiopulmonary status limiting activity  PT Treatment Interventions DME instruction;Gait training;Functional mobility training;Stair training;Therapeutic activities;Therapeutic exercise;Balance training;Neuromuscular re-education;Patient/family education;Modalities   PT Goals (Current goals can be found in the Care Plan section) Acute Rehab PT Goals Patient Stated Goal: Go home PT Goal Formulation: With patient/family Time For Goal Achievement: 05/11/15 Potential to Achieve Goals: Good    Frequency Min 3X/week   Barriers to discharge        Co-evaluation               End of Session Equipment Utilized During Treatment: Gait belt Activity Tolerance: Patient tolerated treatment  well Patient left: in chair;with call bell/phone within reach;with chair alarm set;with family/visitor present Nurse Communication: Mobility status    Functional Assessment Tool Used: clinical observation Functional Limitation: Mobility: Walking and moving around Mobility: Walking and Moving Around Current Status [phone]): At least 1 percent but less than 20 percent impaired, limited or restricted Mobility: Walking and Moving Around Goal Status [phone]): At least 1 percent but less than 20 percent impaired, limited or restricted    Time: 1413-1455 PT Time Calculation (min) (ACUTE ONLY): 42 min   Charges:     PT Treatments $Gait Training: 8-22 mins $Self Care/Home Management: 8-22   PT G Codes:   PT G-Codes **NOT FOR INPATIENT CLASS** Functional Assessment Tool Used: clinical observation Functional Limitation: Mobility: Walking and moving around Mobility: Walking and Moving Around Current Status ([id number]): At least 1 percent but less than 20 percent impaired, limited or restricted Mobility: Walking and Moving Around Goal Status [phone]): At least 1 percent but less than 20 percent impaired, limited or restricted    Ellouise Newer 04/27/2015, 3:16 PM Camille Bal [place], [place]

## 2015-04-27 NOTE — Progress Notes (Signed)
ANTICOAGULATION CONSULT NOTE - Initial Consult  Pharmacy Consult for Heparin (holding Apixaban) Indication: atrial fibrillation  Allergies  Allergen Reactions  . Morphine And Related Nausea And Vomiting  . Oxycodone Nausea And Vomiting    Patient Measurements: Height: 5\' 7"  (170.2 cm) Weight: 172 lb 13.5 oz (78.4 kg) IBW/kg (Calculated) : 66.1  Vital Signs: Temp: 98 F (36.7 C) (10/13 2309) Temp Source: Oral (10/13 2309) BP: 182/99 mmHg (10/13 2309) Pulse Rate: 101 (10/13 2309)  Labs:  Recent Labs  04/26/15 2020 04/27/15 0232  HGB 13.9 12.3*  HCT 42.4 37.7*  PLT 191 190  APTT  --  31  HEPARINUNFRC  --  0.99*  CREATININE 1.13 1.17  CKTOTAL  --  54  TROPONINI <0.03  --     Estimated Creatinine Clearance: 37.7 mL/min (by C-G formula based on Cr of 1.17).   Medical History: Past Medical History  Diagnosis Date  . Hypertension   . Hypercholesteremia   . Stomach ulcer     years  . H/O hiatal hernia   . Varicose veins     both legs  . Arthritis     left knee  . Diabetes mellitus     diet controlled, pt does not check cbg  . Left rotator cuff tear Jul 12, 2012  . Edema   . Unspecified hereditary and idiopathic peripheral neuropathy   . Dizziness and giddiness   . Unspecified vitamin D deficiency   . Diaphragmatic hernia without mention of obstruction or gangrene      Assessment: Holding apixaban and starting heparin for afib. Baseline heparin level is elevated at 0.99 due to apixaban influence on anti-Xa levels. We will use aPTT to dose until the anti-Xa and aPTT correlate. Hgb 12.3, renal function good, other labs reviewed. Last dose apixaban >12 hours ago, will start heparin now.   Goal of Therapy:  Heparin level 0.3-0.7 units/ml aPTT 66-102 seconds Monitor platelets by anticoagulation protocol: Yes   Plan:  -Start heparin drip at 1000 units/hr -1230 HL -Daily CBC/HL -Monitor for bleeding  Narda Bonds 04/27/2015,4:17 AM

## 2015-04-27 NOTE — Progress Notes (Signed)
[place] for Heparin (holding Apixaban) Indication: atrial fibrillation  Allergies  Allergen Reactions  . Morphine And Related Nausea And Vomiting  . Oxycodone Nausea And Vomiting    Patient Measurements: Height: 5\' 7"  (170.2 cm) Weight: 153 lb 3.2 oz (69.491 kg) IBW/kg (Calculated) : 66.1  Vital Signs: Temp: 97.6 F (36.4 C) (10/14 2056) Temp Source: Oral (10/14 2056) BP: 134/62 mmHg (10/14 2056) Pulse Rate: 82 (10/14 2056)  Labs:  Recent Labs  04/26/15 2020 04/27/15 0232 04/27/15 1229 04/27/15 2156  HGB 13.9 12.3*  --   --   HCT 42.4 37.7*  --   --   PLT 191 190  --   --   APTT  --  31 59* 54*  HEPARINUNFRC  --  0.99*  --   --   CREATININE 1.13 1.17  --   --   CKTOTAL  --  54  --   --   TROPONINI <0.03  --   --   --     Estimated Creatinine Clearance: 37.7 mL/min (by C-G formula based on Cr of 1.17).   Assessment:  81 yo M admitted s/p fall w/ multiple contusions.  Holding apixaban and bridging with heparin for afib. Baseline heparin level elevated at 0.99 due to apixaban influence on anti-Xa levels. We will use aPTT to dose until the anti-Xa and aPTT correlate. Hgb 12.3, renal function good, other labs reviewed. Last dose apixaban >12 hours upon admission.  Aptt still low at 54, down from earlier after rate change. No bleeding issues noted. Will increase rate and check in am.  Goal of Therapy:  Heparin level 0.3-0.7 units/ml aPTT 66-102 seconds Monitor platelets by anticoagulation protocol: Yes   Plan:  -increase heparin drip to 1250 units/hr - 8 hrHL -Daily CBC/HL/aPTT -Monitor for bleeding  Erin Hearing PharmD., BCPS Clinical Pharmacist Pager [phone] 04/27/2015 10:37 PM

## 2015-04-27 NOTE — H&P (Addendum)
[facility] History and Physical  Antavion Snelgrove KNL:[id number] DOB: 10-12-1920 DOA: 04/26/2015  Referring physician: Dr. Sabra Heck. PCP: Lauree Chandler, NP  Specialists: Cardiologist.  Chief Complaint: Fall.  HPI: Logan Bean is a 81 y.o. male with history of atrial fibrillation on Apixaban, hypertension, diabetes mellitus was brought to the ER after patient had a fall. Patient states he was active the whole day but late in the evening he felt worn out and weak and suddenly fell but did not loose consciousness. Family found him on the floor after he activated the alert button. Patient was brought to the ER. On exam patient had multiple contusions specifically on his right shoulder left knee and right side of his forehead. He also has multiple other contusions. CT head and x-rays did not reveal any acute fracture. Patient has been admitted for further observation. Patient denies any chest pain shortness of breath palpitations.   Review of Systems: As presented in the history of presenting illness, rest negative.  Past Medical History  Diagnosis Date  . Hypertension   . Hypercholesteremia   . Stomach ulcer     years  . H/O hiatal hernia   . Varicose veins     both legs  . Arthritis     left knee  . Diabetes mellitus     diet controlled, pt does not check cbg  . Left rotator cuff tear Jul 12, 2012  . Edema   . Unspecified hereditary and idiopathic peripheral neuropathy   . Dizziness and giddiness   . Unspecified vitamin D deficiency   . Diaphragmatic hernia without mention of obstruction or gangrene    Past Surgical History  Procedure Laterality Date  . Esopagus strectching  2003 and 3 yrs ago  . Tonsillectomy  age 58  . Shoulder open rotator cuff repair  11/05/2011    Procedure: ROTATOR CUFF REPAIR SHOULDER OPEN;  Surgeon: Tobi Bastos, MD;  Location: [facility];  Service: Orthopedics;  Laterality: Left;  Left Shoulder Rotator Cuff Repair Open with Graft and Anchors   . Reverse shoulder arthroplasty Right 09/17/2012    Procedure: REVERSE SHOULDER ARTHROPLASTY;  Surgeon: Augustin Schooling, MD;  Location: [facility];  Service: Orthopedics;  Laterality: Right;  . Orif ankle fracture Right 09/20/2012    Procedure: OPEN REDUCTION INTERNAL FIXATION (ORIF) ANKLE FRACTURE;  Surgeon: Johnn Hai, MD;  Location: [facility];  Service: Orthopedics;  Laterality: Right;  . Mastectomy      bilateral  . Cardioversion N/A 02/22/2013    Procedure: CARDIOVERSION;  Surgeon: Darlin Coco, MD;  Location: [facility];  Service: Cardiovascular;  Laterality: N/A;   Social History:  reports that he quit smoking about 18 years ago. His smoking use included Pipe and Cigarettes. He quit after 50 years of use. He has never used smokeless tobacco. He reports that he drinks alcohol. He reports that he does not use illicit drugs. Where does patient live home. Can patient participate in ADLs? Yes.  Allergies  Allergen Reactions  . Morphine And Related Nausea And Vomiting  . Oxycodone Nausea And Vomiting    Family History:  Family History  Problem Relation Age of Onset  . Parkinson's disease Mother   . Hip fracture Mother   . Heart disease Mother   . Alzheimer's disease Mother   . Heart disease Father   . Parkinson's disease Sister   . Heart disease Brother       Prior to Admission medications   Medication Sig Start Date End  Date Taking? Authorizing Provider  acetaminophen (TYLENOL) 650 MG CR tablet Take 1,300 mg by mouth daily as needed (joint pain).    Yes Historical Provider, MD  apixaban (ELIQUIS) 5 MG TABS tablet Take 5 mg by mouth 2 (two) times daily.   Yes Historical Provider, MD  Calcium Carbonate-Vitamin D 600-200 MG-UNIT TABS Take 1 tablet by mouth 2 (two) times daily. Take 1 tablet daily for vitamin supplement.   Yes Historical Provider, MD  furosemide (LASIX) 20 MG tablet Take 10 mg by mouth daily as needed for fluid.    Yes Historical Provider, MD   Glucosamine-Chondroit-Vit C-Mn (GLUCOSAMINE 1500 COMPLEX) CAPS Take one tablet once a day Patient taking differently: Take 1 capsule by mouth daily.  01/12/13  Yes Mahima Pandey, MD  lisinopril (PRINIVIL,ZESTRIL) 20 MG tablet Take 20 mg by mouth daily.   Yes Historical Provider, MD  metFORMIN (GLUCOPHAGE) 500 MG tablet Take 1 tablet (500 mg total) by mouth daily with breakfast. 04/24/15  Yes Lauree Chandler, NP  metoprolol tartrate (LOPRESSOR) 25 MG tablet TAKE 1 TABLET TWICE DAILY. 02/26/15  Yes Erlene Quan, PA-C  Multiple Vitamin (MULITIVITAMIN WITH MINERALS) TABS Take 1 tablet by mouth daily.    Yes Historical Provider, MD  Omega-3 Fatty Acids (FISH OIL) 1000 MG CAPS Take 1,000 mg by mouth daily.    Yes Historical Provider, MD  simvastatin (ZOCOR) 10 MG tablet Take 10 mg by mouth at bedtime.   Yes Historical Provider, MD  ELIQUIS 5 MG TABS tablet TAKE 1 TABLET TWICE DAILY. Patient not taking: Reported on 04/26/2015 03/26/15   Lauree Chandler, NP  glucose blood test strip True Test Glucose Test Strips Check blood sugar once daily Dx: E11.9 05/01/14   Tiffany L Reed, DO  glucose monitoring kit (FREESTYLE) monitoring kit 1 each by Does not apply route as needed for other. Take blood sugar once daily. 01/19/13   Blanchie Serve, MD  lisinopril (PRINIVIL,ZESTRIL) 20 MG tablet TAKE 1 TABLET DAILY FOR BLOOD PRESSURE. Patient not taking: Reported on 04/26/2015 11/21/14   Lauree Chandler, NP  simvastatin (ZOCOR) 10 MG tablet TAKE ONE TABLET AT BEDTIME. Patient not taking: Reported on 04/26/2015 11/21/14   Lauree Chandler, NP  TRUEPLUS LANCETS 30G MISC Use to check blood sugar once daily. Dx E11.9 05/01/14   Gayland Curry, DO    Physical Exam: Filed Vitals:   04/26/15 2115 04/26/15 2145 04/26/15 2230 04/26/15 2309  BP: 187/100 174/93 172/96 182/99  Pulse: 96 116 121 101  Temp:    98 F (36.7 C)  TempSrc:    Oral  Resp: _0 Height:    _1  (1.702 m)  Weight:    78.4 kg (172 lb 13.5  oz)  SpO2: 97% 99% 96% 97%     General:  Moderately built and nourished.  Eyes: Anicteric no pallor.  ENT: Continuation on his right forehead.  Neck: No mass felt.  Cardiovascular: S1-S2 heard.  Respiratory: No rhonchi or crepitations.  Abdomen: Soft nontender bowel sounds present.  Skin: Multiple ecchymosis.  Musculoskeletal: Swelling on his right shoulder and left knee.  Psychiatric: Appears normal.  Neurologic: Alert awake oriented to time place and person. Moves all extremities.  Labs on Admission:  Basic Metabolic Panel:  Recent Labs Lab 04/26/15 2020  NA 133*  K 4.8  CL 98*  CO2 24  GLUCOSE 259*  BUN 41*  CREATININE 1.13  CALCIUM 9.9   Liver Function Tests: No results for  input(s): AST, ALT, ALKPHOS, BILITOT, PROT, ALBUMIN in the last 168 hours. No results for input(s): LIPASE, AMYLASE in the last 168 hours. No results for input(s): AMMONIA in the last 168 hours. CBC:  Recent Labs Lab 04/26/15 2020  WBC 15.0*  NEUTROABS 13.5*  HGB 13.9  HCT 42.4  MCV 83.5  PLT 191   Cardiac Enzymes:  Recent Labs Lab 04/26/15 2020  TROPONINI <0.03    BNP (last 3 results) No results for input(s): BNP in the last 8760 hours.  ProBNP (last 3 results)  Recent Labs  05/18/14 1940  PROBNP 1647.0*    CBG:  Recent Labs Lab 04/26/15 2305  GLUCAP 276*    Radiological Exams on Admission: Dg Chest 2 View  04/26/2015  CLINICAL DATA:  Status post fall. EXAM: CHEST  2 VIEW COMPARISON:  May 19, 2014 FINDINGS: The heart size and mediastinal contours are stable. The heart size is enlarged. There is no focal infiltrate, pulmonary edema, or pleural effusion. There is a question 1.7 cm nodule in the medial right upper lobe. The visualized skeletal structures are stable. IMPRESSION: No active cardiopulmonary disease. Question 1.7 nodule in the medial right upper lobe. Further evaluation with chest CT on outpatient basis is recommended. Electronically Signed    By: Abelardo Diesel M.D.   On: 04/26/2015 20:03   Ct Head Wo Contrast  04/26/2015  CLINICAL DATA:  81 year old male who fell. Lacerations, hematoma, teeth knocked out, neck pain. Initial encounter. EXAM: CT HEAD WITHOUT CONTRAST CT CERVICAL SPINE WITHOUT CONTRAST TECHNIQUE: Multidetector CT imaging of the head and cervical spine was performed following the standard protocol without intravenous contrast. Multiplanar CT image reconstructions of the cervical spine were also generated. COMPARISON:  05/18/2014. FINDINGS: CT HEAD FINDINGS Visualized paranasal sinuses and mastoids are clear. Right forehead scalp hematoma measuring up to 12 mm in thickness. Underlying right frontal bone intact. Hyperostosis frontalis No acute osseous abnormality identified. Calcified atherosclerosis at the skull base. Cerebral volume has mildly diminished since 2015. No ventriculomegaly. Asymmetric CSF along the left frontal convexity is incidentally noted and stable, perhaps a small hygroma but appears inconsequential. No midline shift or intracranial mass effect. Heterogeneous hypodensity in the deep gray matter nuclei compatible with chronic lacunar infarcts appear stable. No acute intracranial hemorrhage identified. No cortically based acute infarct identified. No suspicious intracranial vascular hyperdensity. CT CERVICAL SPINE FINDINGS Osteopenia. Chronic multilevel mild spondylolisthesis in the cervical spine most pronounced at C3-C4 (retrolisthesis). Widespread disc space loss. Visualized skull base is intact. No atlanto-occipital dissociation. Stable cervicothoracic junction alignment. Stable bilateral posterior element alignment. No cervical spine fracture identified. Negative lung apices. Grossly intact visualized upper thoracic levels. Chronic multifactorial degenerative spinal stenosis at C3-C4. Thyromegaly on the right, no discrete thyroid nodule. Mild for age calcified atherosclerosis. Otherwise negative noncontrast  paraspinal soft tissues. IMPRESSION: 1. Forehead soft tissue injury without underlying fracture. 2. No acute intracranial abnormality. Chronic small vessel disease. 3. No acute fracture or listhesis identified in the cervical spine. Ligamentous injury is not excluded. 4. Chronic cervical spine spondylolisthesis with degenerative C3-C4 spinal stenosis. Electronically Signed   By: Genevie Ann M.D.   On: 04/26/2015 19:34   Ct Cervical Spine Wo Contrast  04/26/2015  CLINICAL DATA:  81 year old male who fell. Lacerations, hematoma, teeth knocked out, neck pain. Initial encounter. EXAM: CT HEAD WITHOUT CONTRAST CT CERVICAL SPINE WITHOUT CONTRAST TECHNIQUE: Multidetector CT imaging of the head and cervical spine was performed following the standard protocol without intravenous contrast. Multiplanar CT image reconstructions of the  cervical spine were also generated. COMPARISON:  05/18/2014. FINDINGS: CT HEAD FINDINGS Visualized paranasal sinuses and mastoids are clear. Right forehead scalp hematoma measuring up to 12 mm in thickness. Underlying right frontal bone intact. Hyperostosis frontalis No acute osseous abnormality identified. Calcified atherosclerosis at the skull base. Cerebral volume has mildly diminished since 2015. No ventriculomegaly. Asymmetric CSF along the left frontal convexity is incidentally noted and stable, perhaps a small hygroma but appears inconsequential. No midline shift or intracranial mass effect. Heterogeneous hypodensity in the deep gray matter nuclei compatible with chronic lacunar infarcts appear stable. No acute intracranial hemorrhage identified. No cortically based acute infarct identified. No suspicious intracranial vascular hyperdensity. CT CERVICAL SPINE FINDINGS Osteopenia. Chronic multilevel mild spondylolisthesis in the cervical spine most pronounced at C3-C4 (retrolisthesis). Widespread disc space loss. Visualized skull base is intact. No atlanto-occipital dissociation. Stable  cervicothoracic junction alignment. Stable bilateral posterior element alignment. No cervical spine fracture identified. Negative lung apices. Grossly intact visualized upper thoracic levels. Chronic multifactorial degenerative spinal stenosis at C3-C4. Thyromegaly on the right, no discrete thyroid nodule. Mild for age calcified atherosclerosis. Otherwise negative noncontrast paraspinal soft tissues. IMPRESSION: 1. Forehead soft tissue injury without underlying fracture. 2. No acute intracranial abnormality. Chronic small vessel disease. 3. No acute fracture or listhesis identified in the cervical spine. Ligamentous injury is not excluded. 4. Chronic cervical spine spondylolisthesis with degenerative C3-C4 spinal stenosis. Electronically Signed   By: Genevie Ann M.D.   On: 04/26/2015 19:34   Dg Knee Complete 4 Views Left  04/26/2015  CLINICAL DATA:  81 year old male who fell at home. Pain. Initial encounter. EXAM: LEFT KNEE - COMPLETE 4+ VIEW COMPARISON:  05/18/2014. FINDINGS: Advanced tricompartmental degenerative changes in the left knee maximal in the lateral compartment. 2 cm soft tissue swelling anterior to the patella and joint space compatible with superficial hematoma. The patella appears stable and intact. Osteopenia. No acute fracture identified. Calcified peripheral vascular disease. IMPRESSION: Soft tissue injury. Advanced degenerative changes in the left knee with no acute fracture or dislocation identified. Electronically Signed   By: Genevie Ann M.D.   On: 04/26/2015 20:07   Dg Humerus Right  04/26/2015  CLINICAL DATA:  81 year old male with acute right arm pain following fall today. Initial encounter. EXAM: RIGHT HUMERUS - 2+ VIEW COMPARISON:  09/20/2012 radiographs FINDINGS: Right shoulder arthroplasty again noted without evidence of subluxation or dislocation. No acute fracture or complicating hardware features noted. Soft tissue swelling is present. No focal bony lesions are identified.  IMPRESSION: Soft tissue swelling without acute bony abnormality. Right shoulder arthroplasty without complicating features. Electronically Signed   By: Margarette Canada M.D.   On: 04/26/2015 20:04   Dg Hip Unilat With Pelvis 2-3 Views Right  04/26/2015  CLINICAL DATA:  81 year old male with acute left hip pain following fall today. Initial encounter. EXAM: DG HIP (WITH OR WITHOUT PELVIS) 2-3V RIGHT COMPARISON:  09/11/2012 radiographs FINDINGS: There is no evidence of hip fracture or dislocation. There is no evidence of arthropathy or other focal bone abnormality. IMPRESSION: Negative. Electronically Signed   By: Margarette Canada M.D.   On: 04/26/2015 20:07    EKG: Independently reviewed. A. fib with RVR.  Assessment/Plan Principal Problem:   Near syncope Active Problems:   Essential hypertension   Chronic atrial fibrillation (HCC)   Contusion of multiple sites   Type 2 diabetes mellitus with hyperglycemia ([place])   1. Near-syncope - we will monitor on telemetry. Get physical therapy consult. Patient may need rehabilitation. 2. A. Fib -  initially was in RVR. I have ordered 1 dose of metoprolol now. Closely observe in telemetry. Patient's chads 2 vasc is 4. At this time since patient has multiple contusions I discussed patient about discontinuing Apixaban or placing patient on heparin for now to see if there is any further worsening of his hematoma and ecchymosis. Patient is agreeing for heparin infusion. 3. Type 2 diabetes mellitus with hyperglycemia - hold metformin while inpatient I have placed patient on sliding scale coverage. Closely follow CBGs. 4. Pulmonary nodule - will need further workup as outpatient. 5. History of subclinical hyperthyroidism - check free T4 and T3 and TSH. 6. Hypertension - continue present medications. 7. Multiple contusions status post fall - see #2 with regarding to anticoagulation. 8. Leukocytosis probably reactionary presently afebrile. Chest x-ray and UA are  unremarkable.   I have reviewed patient's old chart and labs.  Personally reviewed patient's chest x-ray and EKG.  DVT Prophylaxisheparin infusion.   Code Status: DO NOT RESUSCITATE.   Family Communication: Discussed with patient's friend was also healthcare power of attorney.   Disposition Plan: Admitted for observation.   KAKRAKANDY,ARSHAD N. [facility] Pager [phone].  If 7PM-7AM, please contact night-coverage [email or web address] Password [email or web address] 04/27/2015, 12:48 AM

## 2015-04-27 NOTE — Progress Notes (Signed)
[place] for Heparin (holding Apixaban) Indication: atrial fibrillation  Allergies  Allergen Reactions  . Morphine And Related Nausea And Vomiting  . Oxycodone Nausea And Vomiting    Patient Measurements: Height: 5\' 7"  (170.2 cm) Weight: 172 lb 13.5 oz (78.4 kg) IBW/kg (Calculated) : 66.1  Vital Signs: Temp: 98 F (36.7 C) (10/14 1000) Temp Source: Oral (10/14 1000) BP: 135/73 mmHg (10/14 1000) Pulse Rate: 82 (10/14 1000)  Labs:  Recent Labs  04/26/15 2020 04/27/15 0232 04/27/15 1229  HGB 13.9 12.3*  --   HCT 42.4 37.7*  --   PLT 191 190  --   APTT  --  31 59*  HEPARINUNFRC  --  0.99*  --   CREATININE 1.13 1.17  --   CKTOTAL  --  54  --   TROPONINI <0.03  --   --     Estimated Creatinine Clearance: 37.7 mL/min (by C-G formula based on Cr of 1.17).   Assessment:  81 yo M admitted s/p fall w/ multiple contusions.  Holding apixaban and bridging with heparin for afib. Baseline heparin level elevated at 0.99 due to apixaban influence on anti-Xa levels. We will use aPTT to dose until the anti-Xa and aPTT correlate. Hgb 12.3, renal function good, other labs reviewed. Last dose apixaban >12 hours upon admission.  Heparin drip started at 1000 units/hr at 0442 am.  APTT 59 seconds at 1229, which is slightly below our goal of 66-102 seconds.  No bleeding reported. No reports of worsening of his hematomas or ecchymosis.   Goal of Therapy:  Heparin level 0.3-0.7 units/ml aPTT 66-102 seconds Monitor platelets by anticoagulation protocol: Yes   Plan:  -increase  heparin drip to t 1100 units/hr - 8 hrHL -Daily CBC/HL/aPTT -Monitor for bleeding Eudelia Bunch, Pharm.D. [phone] 04/27/2015 1:29 PM

## 2015-04-27 NOTE — Progress Notes (Deleted)
Loralie Champagne, pt friend, added to contact list per pt request. Dorthey Sawyer, RN

## 2015-04-27 NOTE — Progress Notes (Signed)
Patient seen and evaluated earlier this AM by my associate. Please refer to H and P for details regarding assessment and plan.  Pt seen and evaluated  Gen: Pt in nad, alert and awake CV: no cyanosis Pulm: no increased wob  Will reassess next am, unless there is an acute change in medical condition.  Amyrah Pinkhasov, [employer]

## 2015-04-28 DIAGNOSIS — R55 Syncope and collapse: Secondary | ICD-10-CM | POA: Diagnosis not present

## 2015-04-28 LAB — CBC
HEMATOCRIT: 37.3 % — AB (ref 39.0–52.0)
Hemoglobin: 12 g/dL — ABNORMAL LOW (ref 13.0–17.0)
MCH: 27.1 pg (ref 26.0–34.0)
MCHC: 32.2 g/dL (ref 30.0–36.0)
MCV: 84.2 fL (ref 78.0–100.0)
Platelets: 161 10*3/uL (ref 150–400)
RBC: 4.43 MIL/uL (ref 4.22–5.81)
RDW: 17.1 % — AB (ref 11.5–15.5)
WBC: 9.1 10*3/uL (ref 4.0–10.5)

## 2015-04-28 LAB — GLUCOSE, CAPILLARY
GLUCOSE-CAPILLARY: 214 mg/dL — AB (ref 65–99)
Glucose-Capillary: 175 mg/dL — ABNORMAL HIGH (ref 65–99)
Glucose-Capillary: 195 mg/dL — ABNORMAL HIGH (ref 65–99)
Glucose-Capillary: 193 mg/dL — ABNORMAL HIGH (ref 65–99)
Glucose-Capillary: 200 mg/dL — ABNORMAL HIGH (ref 65–99)

## 2015-04-28 LAB — T3, FREE: T3, Free: 2.5 pg/mL (ref 2.0–4.4)

## 2015-04-28 MED ORDER — APIXABAN 5 MG PO TABS
5.0000 mg | ORAL_TABLET | Freq: Two times a day (BID) | ORAL | Status: DC
  Administered 2015-04-28 – 2015-05-01 (×6): 5 mg via ORAL
  Filled 2015-04-28 (×6): qty 1

## 2015-04-28 NOTE — Progress Notes (Signed)
Pt with loss of IV access this am. Call to pharmacy for notification that  IV heparin has been paused. To call to pharmacy once IV drip has been resumed. Dorthey Sawyer, RN

## 2015-04-28 NOTE — Progress Notes (Signed)
[facility] PROGRESS NOTE  Logan Bean NTZ:[id number] DOB: [birth year] DOA: 04/26/2015 PCP: Lauree Chandler, NP  Assessment/Plan: Principal Problem:   Near syncope - Etiology uncertain - telemetry monitoring reporting no red flags - PT evaluation recommended home health but patient reports that he is not sure whether he can go home due to left knee pain. Will have physical therapy reevaluation to see if patient is candidate for short-term rehabilitation  Active Problems:   Essential hypertension - Patient on lisinopril and metoprolol    Chronic atrial fibrillation ([place]) -Patient was placed on heparin initially. Will transition eliquis - Continue rate controlling medication metoprolol    Contusion of multiple sites - Secondary to fall, multiple x-rays obtained and no acute fractures reported    Type 2 diabetes mellitus with hyperglycemia (HCC) - Currently on sliding scale and carb modified diet  Code Status: DO NOT RESUSCITATE Family Communication: Discussed with patient also discuss with patient's friend Tourist information centre manager at patient's request Disposition Plan: Pending physical therapy evaluation   Consultants:  None  Procedures:  None  Antibiotics:  None  HPI/Subjective: Patient has no new complaints no acute issues reported overnight  Objective: Filed Vitals:   04/28/15 0958  BP: 135/61  Pulse: 74  Temp: 98.9 F (37.2 C)  Resp: 26    Intake/Output Summary (Last 24 hours) at 04/28/15 1455 Last data filed at 04/28/15 1200  Gross per 24 hour  Intake 936.62 ml  Output    202 ml  Net 734.62 ml   Filed Weights   04/26/15 1843 04/26/15 2309 04/27/15 2056  Weight: 69.854 kg (154 lb) 78.4 kg (172 lb 13.5 oz) 69.491 kg (153 lb 3.2 oz)    Exam:   General:  Patient in no acute distress, alert and awake  Cardiovascular: S1 and S2 present, no rubs  Respiratory: No increased work of breathing, no wheezes, equal chest rise  Abdomen: Soft,  nondistended, nontender  Musculoskeletal: No cyanosis or clubbing   Data Reviewed: Basic Metabolic Panel:  Recent Labs Lab 04/26/15 2020 04/27/15 0232  NA 133* 129*  K 4.8 4.6  CL 98* 94*  CO2 24 24  GLUCOSE 259* 302*  BUN 41* 38*  CREATININE 1.13 1.17  CALCIUM 9.9 9.1   Liver Function Tests: No results for input(s): AST, ALT, ALKPHOS, BILITOT, PROT, ALBUMIN in the last 168 hours. No results for input(s): LIPASE, AMYLASE in the last 168 hours. No results for input(s): AMMONIA in the last 168 hours. CBC:  Recent Labs Lab 04/26/15 2020 04/27/15 0232 04/28/15 0530  WBC 15.0* 13.9* 9.1  NEUTROABS 13.5*  --   --   HGB 13.9 12.3* 12.0*  HCT 42.4 37.7* 37.3*  MCV 83.5 84.0 84.2  PLT 191 190 161   Cardiac Enzymes:  Recent Labs Lab 04/26/15 2020 04/27/15 0232  CKTOTAL  --  41  TROPONINI <0.03  --    BNP (last 3 results) No results for input(s): BNP in the last 8760 hours.  ProBNP (last 3 results)  Recent Labs  05/18/14 1940  PROBNP 1647.0*    CBG:  Recent Labs Lab 04/27/15 1150 04/27/15 1651 04/27/15 2050 04/28/15 0758 04/28/15 1250  GLUCAP 205* 229* 255* 175* 195*    No results found for this or any previous visit (from the past 240 hour(s)).   Studies: Dg Chest 2 View  04/26/2015  CLINICAL DATA:  Status post fall. EXAM: CHEST  2 VIEW COMPARISON:  May 19, 2014 FINDINGS: The heart size and mediastinal contours are stable.  The heart size is enlarged. There is no focal infiltrate, pulmonary edema, or pleural effusion. There is a question 1.7 cm nodule in the medial right upper lobe. The visualized skeletal structures are stable. IMPRESSION: No active cardiopulmonary disease. Question 1.7 nodule in the medial right upper lobe. Further evaluation with chest CT on outpatient basis is recommended. Electronically Signed   By: Abelardo Diesel M.D.   On: 04/26/2015 20:03   Ct Head Wo Contrast  04/26/2015  CLINICAL DATA:  81 year old male who fell.  Lacerations, hematoma, teeth knocked out, neck pain. Initial encounter. EXAM: CT HEAD WITHOUT CONTRAST CT CERVICAL SPINE WITHOUT CONTRAST TECHNIQUE: Multidetector CT imaging of the head and cervical spine was performed following the standard protocol without intravenous contrast. Multiplanar CT image reconstructions of the cervical spine were also generated. COMPARISON:  05/18/2014. FINDINGS: CT HEAD FINDINGS Visualized paranasal sinuses and mastoids are clear. Right forehead scalp hematoma measuring up to 12 mm in thickness. Underlying right frontal bone intact. Hyperostosis frontalis No acute osseous abnormality identified. Calcified atherosclerosis at the skull base. Cerebral volume has mildly diminished since 2015. No ventriculomegaly. Asymmetric CSF along the left frontal convexity is incidentally noted and stable, perhaps a small hygroma but appears inconsequential. No midline shift or intracranial mass effect. Heterogeneous hypodensity in the deep gray matter nuclei compatible with chronic lacunar infarcts appear stable. No acute intracranial hemorrhage identified. No cortically based acute infarct identified. No suspicious intracranial vascular hyperdensity. CT CERVICAL SPINE FINDINGS Osteopenia. Chronic multilevel mild spondylolisthesis in the cervical spine most pronounced at C3-C4 (retrolisthesis). Widespread disc space loss. Visualized skull base is intact. No atlanto-occipital dissociation. Stable cervicothoracic junction alignment. Stable bilateral posterior element alignment. No cervical spine fracture identified. Negative lung apices. Grossly intact visualized upper thoracic levels. Chronic multifactorial degenerative spinal stenosis at C3-C4. Thyromegaly on the right, no discrete thyroid nodule. Mild for age calcified atherosclerosis. Otherwise negative noncontrast paraspinal soft tissues. IMPRESSION: 1. Forehead soft tissue injury without underlying fracture. 2. No acute intracranial abnormality.  Chronic small vessel disease. 3. No acute fracture or listhesis identified in the cervical spine. Ligamentous injury is not excluded. 4. Chronic cervical spine spondylolisthesis with degenerative C3-C4 spinal stenosis. Electronically Signed   By: Genevie Ann M.D.   On: 04/26/2015 19:34   Ct Cervical Spine Wo Contrast  04/26/2015  CLINICAL DATA:  81 year old male who fell. Lacerations, hematoma, teeth knocked out, neck pain. Initial encounter. EXAM: CT HEAD WITHOUT CONTRAST CT CERVICAL SPINE WITHOUT CONTRAST TECHNIQUE: Multidetector CT imaging of the head and cervical spine was performed following the standard protocol without intravenous contrast. Multiplanar CT image reconstructions of the cervical spine were also generated. COMPARISON:  05/18/2014. FINDINGS: CT HEAD FINDINGS Visualized paranasal sinuses and mastoids are clear. Right forehead scalp hematoma measuring up to 12 mm in thickness. Underlying right frontal bone intact. Hyperostosis frontalis No acute osseous abnormality identified. Calcified atherosclerosis at the skull base. Cerebral volume has mildly diminished since 2015. No ventriculomegaly. Asymmetric CSF along the left frontal convexity is incidentally noted and stable, perhaps a small hygroma but appears inconsequential. No midline shift or intracranial mass effect. Heterogeneous hypodensity in the deep gray matter nuclei compatible with chronic lacunar infarcts appear stable. No acute intracranial hemorrhage identified. No cortically based acute infarct identified. No suspicious intracranial vascular hyperdensity. CT CERVICAL SPINE FINDINGS Osteopenia. Chronic multilevel mild spondylolisthesis in the cervical spine most pronounced at C3-C4 (retrolisthesis). Widespread disc space loss. Visualized skull base is intact. No atlanto-occipital dissociation. Stable cervicothoracic junction alignment. Stable bilateral posterior element alignment. No  cervical spine fracture identified. Negative lung  apices. Grossly intact visualized upper thoracic levels. Chronic multifactorial degenerative spinal stenosis at C3-C4. Thyromegaly on the right, no discrete thyroid nodule. Mild for age calcified atherosclerosis. Otherwise negative noncontrast paraspinal soft tissues. IMPRESSION: 1. Forehead soft tissue injury without underlying fracture. 2. No acute intracranial abnormality. Chronic small vessel disease. 3. No acute fracture or listhesis identified in the cervical spine. Ligamentous injury is not excluded. 4. Chronic cervical spine spondylolisthesis with degenerative C3-C4 spinal stenosis. Electronically Signed   By: Genevie Ann M.D.   On: 04/26/2015 19:34   Dg Knee Complete 4 Views Left  04/26/2015  CLINICAL DATA:  81 year old male who fell at home. Pain. Initial encounter. EXAM: LEFT KNEE - COMPLETE 4+ VIEW COMPARISON:  05/18/2014. FINDINGS: Advanced tricompartmental degenerative changes in the left knee maximal in the lateral compartment. 2 cm soft tissue swelling anterior to the patella and joint space compatible with superficial hematoma. The patella appears stable and intact. Osteopenia. No acute fracture identified. Calcified peripheral vascular disease. IMPRESSION: Soft tissue injury. Advanced degenerative changes in the left knee with no acute fracture or dislocation identified. Electronically Signed   By: Genevie Ann M.D.   On: 04/26/2015 20:07   Dg Humerus Right  04/26/2015  CLINICAL DATA:  81 year old male with acute right arm pain following fall today. Initial encounter. EXAM: RIGHT HUMERUS - 2+ VIEW COMPARISON:  09/20/2012 radiographs FINDINGS: Right shoulder arthroplasty again noted without evidence of subluxation or dislocation. No acute fracture or complicating hardware features noted. Soft tissue swelling is present. No focal bony lesions are identified. IMPRESSION: Soft tissue swelling without acute bony abnormality. Right shoulder arthroplasty without complicating features. Electronically Signed    By: Margarette Canada M.D.   On: 04/26/2015 20:04   Dg Hip Unilat With Pelvis 2-3 Views Right  04/26/2015  CLINICAL DATA:  81 year old male with acute left hip pain following fall today. Initial encounter. EXAM: DG HIP (WITH OR WITHOUT PELVIS) 2-3V RIGHT COMPARISON:  09/11/2012 radiographs FINDINGS: There is no evidence of hip fracture or dislocation. There is no evidence of arthropathy or other focal bone abnormality. IMPRESSION: Negative. Electronically Signed   By: Margarette Canada M.D.   On: 04/26/2015 20:07    Scheduled Meds: . insulin aspart  0-9 Units Subcutaneous TID WC  . lisinopril  20 mg Oral Daily  . metoprolol tartrate  25 mg Oral BID  . omega-3 acid ethyl esters  1,000 mg Oral Daily  . simvastatin  10 mg Oral QHS   Continuous Infusions: . sodium chloride 10 mL/hr at 04/28/15 1200    Time spent: > 35 minutes    Velvet Bathe  [facility] Pager [phone] If 7PM-7AM, please contact night-coverage at [email or web address], password [facility] 04/28/2015, 2:55 PM

## 2015-04-28 NOTE — Progress Notes (Signed)
Physical Therapy Treatment Patient Details Name: Logan Bean MRN: [id number] DOB: [birth year] Today's Date: 04/28/2015    History of Present Illness Logan Bean is a 81 y.o. male with history of atrial fibrillation on Apixaban, hypertension, diabetes mellitus was brought to the ER after patient had a fall. Was in 1.  A. Fib - initially was in [facility].    PT Comments    Pt not able to progress with therapy this session. Pain was limiting mobility, even after RN gave meds. Pt was unable to initiate sit>stand transfer due to "waves" (as described by pt) of sharp, shooting pain at L distal quad and knee. Knee is very discolored and appears swollen - pt asking about someone evacuating the hematoma to relieve some pressure. At this time pt is unsafe to return home as he is unable to perform any functional mobility due to pain. Discussed with pt the need for continued therapy prior to return home for safety and independence with functional mobility.   Follow Up Recommendations  SNF;Supervision/Assistance - 24 hour     Equipment Recommendations  None recommended by PT    Recommendations for Other Services       Precautions / Restrictions Precautions Precautions: Fall Restrictions Weight Bearing Restrictions: No    Mobility  Bed Mobility               General bed mobility comments: Pt sitting up in recliner upon PT arrival.   Transfers Overall transfer level: Needs assistance               General transfer comment: Attempted repositioning and initiating transfer to standing for ~15 minutes during session. Pt motivated to walk but was just unable to due to pain.   Ambulation/Gait                 Stairs            Wheelchair Mobility    Modified Rankin (Stroke Patients Only)       Balance Overall balance assessment: Needs assistance;History of Falls Sitting-balance support: No upper extremity supported;Feet supported Sitting balance-Leahy Scale:  Good                              Cognition Arousal/Alertness: Awake/alert Behavior During Therapy: WFL for tasks assessed/performed Overall Cognitive Status: Within Functional Limits for tasks assessed       Memory: Decreased short-term memory              Exercises General Exercises - Lower Extremity Long Arc Quad: 5 reps    General Comments        Pertinent Vitals/Pain Pain Assessment: 0-10 Pain Score: 10-Worst pain ever Pain Location: L knee/quad Pain Descriptors / Indicators: Sharp;Shooting Pain Intervention(s): Limited activity within patient's tolerance;Monitored during session;Repositioned;Patient requesting pain meds-RN notified;RN gave pain meds during session;Ice applied    Home Living                      Prior Function            PT Goals (current goals can now be found in the care plan section) Acute Rehab PT Goals Patient Stated Goal: Decrease pain PT Goal Formulation: With patient Time For Goal Achievement: 05/11/15 Potential to Achieve Goals: Good Progress towards PT goals: Not progressing toward goals - comment (Pain limiting progress)    Frequency  Min 3X/week    PT Plan Discharge plan needs to  be updated    Co-evaluation             End of Session   Activity Tolerance: Patient limited by pain Patient left: in chair;with call bell/phone within reach     Time: 1345-1419 PT Time Calculation (min) (ACUTE ONLY): 34 min  Charges:  $Therapeutic Activity: 23-37 mins                    G Codes:      Rolinda Roan Apr 30, 2015, 3:13 PM   Rolinda Roan, PT, DPT Acute Rehabilitation Services Pager: [phone]

## 2015-04-28 NOTE — Progress Notes (Signed)
Spoke to Logan Bean- PT regarding the care order instruction from Dr. Wendee Beavers as below   Pt complaining about his knee and is not sure whether he can go home. Please contact Physical therapy and see if patient qualifies for SNF placement

## 2015-04-28 NOTE — Care Management Note (Signed)
Case Management Note  Patient Details  Name: Nyan Dufresne MRN: [id number] Date of Birth: 1921/02/09  Subjective/Objective:         S/P fall at home left leg swelling, pain, and facial bruises.   Action/Plan:    CM reviewed chart for [place] skilled needs. Met with patient at bedside, to discuss [facility] recommendations, patient and friend [facility] requesting that patient go to a SNF for rehab. CM explained to patient and friend that as per Medicare Rules patients need  3 day qualifying stay for medicare to cover payment for rehab facilities, and he is in Observation status patient apparently has increased pain and swelling despite, pain meds for management. Patient unable to stand or ambulate. CM spoke with Dr. Wendee Beavers, who agrees will re-evaluate order additional imaging. Updated patient, friend, and Larkin Ina RN they are agreeable to plan. CM will continue to follow for discharge plan.  Expected Discharge Date:  04/30/15               Expected Discharge Plan:  [place]  In-House Referral:     Discharge planning Services  CM Consult  Post Acute Care Choice:    Choice offered to:  Patient  DME Arranged:    DME Agency:     HH Arranged:    [place] Agency:     Status of Service:  In process, will continue to follow  Medicare Important Message Given:    Date Medicare IM Given:    Medicare IM give by:    Date Additional Medicare IM Given:    Additional Medicare Important Message give by:     If discussed at [place] of Stay Meetings, dates discussed:    Additional CommentsLaurena Slimmer, RN 04/28/2015, 3:49 PM

## 2015-04-29 ENCOUNTER — Observation Stay (HOSPITAL_COMMUNITY): Payer: Medicare Other

## 2015-04-29 DIAGNOSIS — R55 Syncope and collapse: Secondary | ICD-10-CM | POA: Diagnosis not present

## 2015-04-29 DIAGNOSIS — M179 Osteoarthritis of knee, unspecified: Secondary | ICD-10-CM | POA: Diagnosis not present

## 2015-04-29 LAB — CBC
HEMATOCRIT: 36.9 % — AB (ref 39.0–52.0)
Hemoglobin: 11.5 g/dL — ABNORMAL LOW (ref 13.0–17.0)
MCH: 26.4 pg (ref 26.0–34.0)
MCHC: 31.2 g/dL (ref 30.0–36.0)
MCV: 84.6 fL (ref 78.0–100.0)
PLATELETS: 149 10*3/uL — AB (ref 150–400)
RBC: 4.36 MIL/uL (ref 4.22–5.81)
RDW: 17.1 % — AB (ref 11.5–15.5)
WBC: 7.8 10*3/uL (ref 4.0–10.5)

## 2015-04-29 LAB — GLUCOSE, CAPILLARY
GLUCOSE-CAPILLARY: 170 mg/dL — AB (ref 65–99)
Glucose-Capillary: 174 mg/dL — ABNORMAL HIGH (ref 65–99)
Glucose-Capillary: 180 mg/dL — ABNORMAL HIGH (ref 65–99)
Glucose-Capillary: 235 mg/dL — ABNORMAL HIGH (ref 65–99)

## 2015-04-29 NOTE — Clinical Social Work Note (Signed)
Clinical Social Work Assessment  Patient Details  Name: Logan Bean MRN: [id number] Date of Birth: November 05, 1920  Date of referral:  04/29/15               Reason for consult:  Facility Placement                Permission sought to share information with:  Other Permission granted to share information::  Yes, Verbal Permission Granted  Name::     Nicki Reaper and [place]::     Relationship::  Family Friends  Contact Information:  [phone]  Housing/Transportation Living arrangements for the past 2 months:  Single Family Home Source of Information:  Patient, Marland Mcalpine (Currently lives in an inlaw suite on the [facility] property. Has been living on their property for at least 4 years and known each other fsince the 43s) Patient Interpreter Needed:  None Criminal Activity/Legal Involvement Pertinent to Current Situation/Hospitalization:  No - Comment as needed Significant Relationships:  Friend, Neighbor Lives with:  Self, Friends Do you feel safe going back to the place where you live?  Yes Need for family participation in patient care:  Yes (Comment)  Care giving concerns: Patient currently lives on the property of long term family-friends. He lives in the [facility] 'guest house' and has been with them for over 4 years, as they can recall. Patient states that he is very mobile and independent and at this time the swelling in his knee has him unable to get around like he's used to. [facility] are not able to provide 24 hour care but state that money is not a barrier for care needs of the patient.    Social Worker assessment / plan: Patient is open to SNF. First choice is [facility] where patient stayed about 2 years ago. Other options include [facility], [facility] or [facility]. Mr. Charlestine Massed stated that he was NOT interested in [facility] or [facility] (either location). Patient and Mr. Charlestine Massed stated that they would also consider home health aid and [facility] PT is that was  necessary. Mr. Charlestine Massed wanted to know if medicare would pay for Skilled needs if they paid for the facility privately.   Employment status:  Retired Health visitor, Managed Care PT Recommendations:  [place] / Referral to community resources:  [place]  Patient/Family's Response to care: Patient and supports are very understanding and willing to ask questions about the systems of care in order to make an appropriate plan for support.   Patient/Family's Understanding of and Emotional Response to Diagnosis, Current Treatment, and Prognosis: Patient's states, "I think I'll be fine if I get the swelling on this knee down." Patient and supports feel very confident that once these knee issues are managed he will be able to return to his baseline.   Emotional Assessment Appearance:  Appears stated age Attitude/Demeanor/Rapport:   (Very pleasant and understanding) Affect (typically observed):  Accepting, Appropriate Orientation:  Oriented to Self, Oriented to Place, Oriented to  Time, Oriented to Situation Alcohol / Substance use:  Not Applicable Psych involvement (Current and /or in the community):  No (Comment)  Discharge Needs  Concerns to be addressed:  Financial / Insurance Concerns ([facility] would like to know if insurance will pay for skilled care while at SNF, if snf is paid out of pocket. ) Readmission within the last 30 days:  No Current discharge risk:  None Barriers to Discharge:  No Barriers Identified   Deneane Stifter  J, LCSW 04/29/2015, 5:12 PM

## 2015-04-29 NOTE — Care Management (Addendum)
CM following up to determine disposition plan. Awaiting results of CT scan.

## 2015-04-29 NOTE — Progress Notes (Signed)
[facility] PROGRESS NOTE  Logan Bean YWV:[id number] DOB: 22-Mar-1921 DOA: 04/26/2015 PCP: Lauree Chandler, NP  Assessment/Plan: Principal Problem:   Near syncope - Etiology uncertain - telemetry monitoring reporting no red flags - PT evaluation recommending SNF  Active Problems:   Essential hypertension - Patient on lisinopril and metoprolol    Chronic atrial fibrillation (HCC) - Currently on eliquis - Continue rate controlling medication metoprolol    Contusion of multiple sites - Secondary to fall, multiple x-rays obtained and no acute fractures reported - CT of knee obtained due to complaints yesterday of worsening discomfort. Reports hematoma but not large effusion at knee    Type 2 diabetes mellitus with hyperglycemia (HCC) - Currently on sliding scale and carb modified diet  Code Status: DO NOT RESUSCITATE Family Communication: Discussed with patient also discuss with patient's friend Tourist information centre manager at patient's request Disposition Plan: Pending physical therapy evaluation   Consultants:  None  Procedures:  None  Antibiotics:  None  HPI/Subjective: Pt states that his knee is feeling better than yesterday  Objective: Filed Vitals:   04/29/15 0756  BP: 170/69  Pulse: 65  Temp: 98.3 F (36.8 C)  Resp: 18    Intake/Output Summary (Last 24 hours) at 04/29/15 1453 Last data filed at 04/29/15 1052  Gross per 24 hour  Intake    710 ml  Output    802 ml  Net    -92 ml   Filed Weights   04/26/15 2309 04/27/15 2056 04/28/15 2100  Weight: 78.4 kg (172 lb 13.5 oz) 69.491 kg (153 lb 3.2 oz) 70 kg (154 lb 5.2 oz)    Exam:   General:  Patient in no acute distress, alert and awake  Cardiovascular: S1 and S2 present, no rubs  Respiratory: No increased work of breathing, no wheezes, equal chest rise  Abdomen: Soft, nondistended, nontender  Musculoskeletal: No cyanosis or clubbing   Data Reviewed: Basic Metabolic Panel:  Recent  Labs Lab 04/26/15 2020 04/27/15 0232  NA 133* 129*  K 4.8 4.6  CL 98* 94*  CO2 24 24  GLUCOSE 259* 302*  BUN 41* 38*  CREATININE 1.13 1.17  CALCIUM 9.9 9.1   Liver Function Tests: No results for input(s): AST, ALT, ALKPHOS, BILITOT, PROT, ALBUMIN in the last 168 hours. No results for input(s): LIPASE, AMYLASE in the last 168 hours. No results for input(s): AMMONIA in the last 168 hours. CBC:  Recent Labs Lab 04/26/15 2020 04/27/15 0232 04/28/15 0530 04/29/15 0612  WBC 15.0* 13.9* 9.1 7.8  NEUTROABS 13.5*  --   --   --   HGB 13.9 12.3* 12.0* 11.5*  HCT 42.4 37.7* 37.3* 36.9*  MCV 83.5 84.0 84.2 84.6  PLT 191 190 161 149*   Cardiac Enzymes:  Recent Labs Lab 04/26/15 2020 04/27/15 0232  CKTOTAL  --  90  TROPONINI <0.03  --    BNP (last 3 results) No results for input(s): BNP in the last 8760 hours.  ProBNP (last 3 results)  Recent Labs  05/18/14 1940  PROBNP 1647.0*    CBG:  Recent Labs Lab 04/28/15 1605 04/28/15 1712 04/28/15 2058 04/29/15 0753 04/29/15 1155  GLUCAP 193* 200* 214* 174* 170*    No results found for this or any previous visit (from the past 240 hour(s)).   Studies: Ct Knee Left Wo Contrast  04/29/2015  CLINICAL DATA:  Status post fall 04/26/2015 with left knee pain. Subsequent encounter. EXAM: CT OF THE LEFT KNEE WITHOUT CONTRAST TECHNIQUE: Multidetector CT  imaging of the left knee was performed according to the standard protocol. Multiplanar CT image reconstructions were also generated. COMPARISON:  None films left knee 04/26/2015 and 05/18/2014. FINDINGS: Bones are osteopenic. No fracture is identified. Osteophytosis is seen about the knee consistent with mild to moderate degenerative disease. A hematoma measuring approximately 3.3 cm AP x 8.1 cm transverse x 12.5 cm craniocaudal is seen over the anterior and lateral aspect of the knee in the subcutaneous tissues. There is only a small joint effusion. No lipohemarthrosis is  identified. Cruciate and collateral ligament complexes appear intact. IMPRESSION: Large hematoma anterior and lateral to the left knee. Negative for fracture. Mild to moderate tricompartmental osteoarthritis. Osteopenia. Small, simple appearing joint effusion. Electronically Signed   By: Inge Rise M.D.   On: 04/29/2015 12:28    Scheduled Meds: . apixaban  5 mg Oral BID  . insulin aspart  0-9 Units Subcutaneous TID WC  . lisinopril  20 mg Oral Daily  . metoprolol tartrate  25 mg Oral BID  . omega-3 acid ethyl esters  1,000 mg Oral Daily  . simvastatin  10 mg Oral QHS   Continuous Infusions: . sodium chloride 10 mL/hr at 04/28/15 1600    Time spent: > 35 minutes    Velvet Bathe  [facility] Pager [phone] If 7PM-7AM, please contact night-coverage at [email or web address], password [email or web address] 04/29/2015, 2:53 PM

## 2015-04-30 DIAGNOSIS — R55 Syncope and collapse: Secondary | ICD-10-CM | POA: Diagnosis not present

## 2015-04-30 LAB — GLUCOSE, CAPILLARY
GLUCOSE-CAPILLARY: 161 mg/dL — AB (ref 65–99)
GLUCOSE-CAPILLARY: 171 mg/dL — AB (ref 65–99)
GLUCOSE-CAPILLARY: 163 mg/dL — AB (ref 65–99)
Glucose-Capillary: 175 mg/dL — ABNORMAL HIGH (ref 65–99)

## 2015-04-30 MED ORDER — TRAMADOL HCL 50 MG PO TABS: 50.0000 mg | ORAL_TABLET | Freq: Four times a day (QID) | ORAL | Status: DC | PRN

## 2015-04-30 NOTE — Progress Notes (Signed)
Spoke at length with [facility] [place] and made them aware that pt is ready to be discharged and CM is ready to make a definitive plan. They are concerned that CM is calling at " 3:00 pm" reiterated that they had spoken with CSW and CM this weekend and that pt would be discharged today Plan was to SNF. They have changed their minds. Plan B was to use [facility] as HHRN, HHPT and CM will arrange. [facility] [place] were Given names of Private Duty agencies to call and see if they can assist with 24/7 care until pt is more stable and able to assist self more. Will await return call from family.

## 2015-04-30 NOTE — Care Management (Signed)
Spoke with Dr. Wendee Beavers concerning result of knee CT, neg for fx,, patient has large hematoma of the knee, not requiring any intervention. Patient unable to weight bear, CM and CSW met with patient and Charlyne Petrin POA [phone] to discuss discharge plan, options HH vs SNF. Pay out of pocket.  Information for both options presented Patient requesting SNF CSW will fax information out to SNF. CM allso discussed HH and Private pay option with patient and friend,,  Patient reports having [facility] in the past and would elect for them once again, will provide patient and family with Private Duty List. CM  Handed off to covering Unit CM, Jeannie Crutchfield CM .

## 2015-04-30 NOTE — Progress Notes (Signed)
[place] for Apixaban Indication: atrial fibrillation  Allergies  Allergen Reactions  . Morphine And Related Nausea And Vomiting  . Oxycodone Nausea And Vomiting    Patient Measurements: Height: 5\' 7"  (170.2 cm) Weight: 156 lb 15.5 oz (71.2 kg) IBW/kg (Calculated) : 66.1  Vital Signs: Temp: 97.9 F (36.6 C) (10/17 0945) Temp Source: Oral (10/17 0945) BP: 136/89 mmHg (10/17 0945) Pulse Rate: 91 (10/17 0945)  Labs:  Recent Labs  04/27/15 2156 04/28/15 0530 04/29/15 0612  HGB  --  12.0* 11.5*  HCT  --  37.3* 36.9*  PLT  --  161 149*  APTT 54*  --   --     Estimated Creatinine Clearance: 37.7 mL/min (by C-G formula based on Cr of 1.17).   Assessment:  81 yo M admitted s/p fall w/ multiple contusions. Patient's apixaban was held, and he bridged to heparin. Now back on apixaban.  Age: 81, wt: 71kg, SCr: 1.17. Patient still qualifies for apixaban 5mg  BID.Marland Kitchen  Hgb 11.5, plts 149- some drop since admit. With bruising on face as well as knee contusion, but no other overt bleeding.  Goal of Therapy:  Heparin level 0.3-0.7 units/ml aPTT 66-102 seconds Monitor platelets by anticoagulation protocol: Yes   Plan:  -continue apixaban 5mg  PO BID -pharmacy to sign off and will monitor peripherally  Kingdavid Leinbach D. Ezma Rehm, PharmD, BCPS Clinical Pharmacist Pager: [phone] 04/30/2015 2:15 PM

## 2015-04-30 NOTE — Clinical Social Work Note (Signed)
CSW talked with patient and then by phone (at patient's request) with POA LynnRogan and her husband Nicki Reaper regarding patient's discharge plan. They are aware that because patient is Medicare A&B and observation status, his Medicare won't pay for room and board at skilled facility. They were provided with the room and board rate for [facility] and [place] per month and the facility would bill Medicare for medications, therapy and supplies. The Rogan's requested [facility] list and were informed that the nurse case manager would be informed.   Murriel Eidem Givens, MSW, LCSW Licensed Clinical Social Worker [place] [phone]

## 2015-04-30 NOTE — Progress Notes (Signed)
ABN delivered to pt, Logan Bean and [employer]. All shocked that CM expected Discharge today, as was discussed in meeting with w/e RNCM, [facility] CSW  and MD on 04/29/2015. [facility]M clearly documented plan which was A) discharge to SNF (private pay)   (CSW offered [facility] on 10/17 as well as cost to [facility]  which they immediately refused.). Plan B) [place] which [facility] denied any knowledge of,  reporting they were awaiting lists of available [facility] agencies as well as Art gallery manager. (Both provided as well as detailed descriptions of services provided by each and differences between skilled services and unskilled services. MD has agreed to keep pt overnight and family is aware there is no medical need for him to be in the hospital at present. Family states they are prepared to make decision in AM when MD writes actual Discharge order.

## 2015-04-30 NOTE — Progress Notes (Signed)
[facility] PROGRESS NOTE  Logan Bean FUX:[id number] DOB: [birth year] DOA: 04/26/2015 PCP: Lauree Chandler, NP  Assessment/Plan: Principal Problem:   Near syncope - Etiology uncertain - no red flags on telemetry  - PT evaluation recommending SNF  Active Problems:   Essential hypertension - Patient on lisinopril and metoprolol    Chronic atrial fibrillation (HCC) - Currently on eliquis - Continue rate controlling medication metoprolol    Contusion of multiple sites - Secondary to fall, multiple x-rays obtained and no acute fractures reported - CT of knee obtained due to complaints yesterday of worsening discomfort. Reports hematoma but not large effusion at knee    Type 2 diabetes mellitus with hyperglycemia (HCC) - Currently on sliding scale and carb modified diet  Code Status: DO NOT RESUSCITATE Family Communication: Discussed with patient also discuss with patient's friend Lyndal Pulley at patient's request Disposition Plan: Family dose not have safe disposition plans at the moment. Will give family another evening to plan so that they can have everything in place for discharge tomorrow 05/01/15   Consultants:  None  Procedures:  None  Antibiotics:  None  HPI/Subjective: Pt has no new complaints today  Objective: Filed Vitals:   04/30/15 0945  BP: 136/89  Pulse: 91  Temp: 97.9 F (36.6 C)  Resp: 20    Intake/Output Summary (Last 24 hours) at 04/30/15 1625 Last data filed at 04/30/15 0900  Gross per 24 hour  Intake    480 ml  Output    526 ml  Net    -46 ml   Filed Weights   04/27/15 2056 04/28/15 2100 04/29/15 2054  Weight: 69.491 kg (153 lb 3.2 oz) 70 kg (154 lb 5.2 oz) 71.2 kg (156 lb 15.5 oz)    Exam:   General:  Patient in no acute distress, alert and awake  Cardiovascular: S1 and S2 present, no rubs  Respiratory: No increased work of breathing, no wheezes, equal chest rise  Abdomen: Soft, nondistended,  nontender  Musculoskeletal: No cyanosis or clubbing   Data Reviewed: Basic Metabolic Panel:  Recent Labs Lab 04/26/15 2020 04/27/15 0232  NA 133* 129*  K 4.8 4.6  CL 98* 94*  CO2 24 24  GLUCOSE 259* 302*  BUN 41* 38*  CREATININE 1.13 1.17  CALCIUM 9.9 9.1   Liver Function Tests: No results for input(s): AST, ALT, ALKPHOS, BILITOT, PROT, ALBUMIN in the last 168 hours. No results for input(s): LIPASE, AMYLASE in the last 168 hours. No results for input(s): AMMONIA in the last 168 hours. CBC:  Recent Labs Lab 04/26/15 2020 04/27/15 0232 04/28/15 0530 04/29/15 0612  WBC 15.0* 13.9* 9.1 7.8  NEUTROABS 13.5*  --   --   --   HGB 13.9 12.3* 12.0* 11.5*  HCT 42.4 37.7* 37.3* 36.9*  MCV 83.5 84.0 84.2 84.6  PLT 191 190 161 149*   Cardiac Enzymes:  Recent Labs Lab 04/26/15 2020 04/27/15 0232  CKTOTAL  --  84  TROPONINI <0.03  --    BNP (last 3 results) No results for input(s): BNP in the last 8760 hours.  ProBNP (last 3 results)  Recent Labs  05/18/14 1940  PROBNP 1647.0*    CBG:  Recent Labs Lab 04/29/15 1155 04/29/15 1658 04/29/15 2051 04/30/15 0733 04/30/15 1130  GLUCAP 170* 180* 235* 175* 161*    No results found for this or any previous visit (from the past 240 hour(s)).   Studies: Ct Knee Left Wo Contrast  04/29/2015  CLINICAL DATA:  Status post fall 04/26/2015 with left knee pain. Subsequent encounter. EXAM: CT OF THE LEFT KNEE WITHOUT CONTRAST TECHNIQUE: Multidetector CT imaging of the left knee was performed according to the standard protocol. Multiplanar CT image reconstructions were also generated. COMPARISON:  None films left knee 04/26/2015 and 05/18/2014. FINDINGS: Bones are osteopenic. No fracture is identified. Osteophytosis is seen about the knee consistent with mild to moderate degenerative disease. A hematoma measuring approximately 3.3 cm AP x 8.1 cm transverse x 12.5 cm craniocaudal is seen over the anterior and lateral aspect of  the knee in the subcutaneous tissues. There is only a small joint effusion. No lipohemarthrosis is identified. Cruciate and collateral ligament complexes appear intact. IMPRESSION: Large hematoma anterior and lateral to the left knee. Negative for fracture. Mild to moderate tricompartmental osteoarthritis. Osteopenia. Small, simple appearing joint effusion. Electronically Signed   By: Inge Rise M.D.   On: 04/29/2015 12:28    Scheduled Meds: . apixaban  5 mg Oral BID  . insulin aspart  0-9 Units Subcutaneous TID WC  . lisinopril  20 mg Oral Daily  . metoprolol tartrate  25 mg Oral BID  . omega-3 acid ethyl esters  1,000 mg Oral Daily  . simvastatin  10 mg Oral QHS   Continuous Infusions: . sodium chloride 10 mL/hr at 04/28/15 1600    Time spent: > 35 minutes    Velvet Bathe  [facility] Pager [phone] If 7PM-7AM, please contact night-coverage at [email or web address], password [facility] 04/30/2015, 4:25 PM

## 2015-04-30 NOTE — Discharge Summary (Signed)
Physician Discharge Summary  Logan Bean [id number] DOB: March 15, 1921 DOA: 04/26/2015  PCP: Lauree Chandler, NP  Admit date: 04/26/2015 Discharge date: 04/30/2015  Time spent: > 35 minutes  Recommendations for Outpatient Follow-up:  1. Monitor hgb levels.  2. Decide whether or not to continue eliquis should patient continue to be at risk for falls. He currently wishes to continue eliquis at this point 3. Pt will need physical therapy 4. Reassess S creatinine in 1 week  Discharge Diagnoses:  Principal Problem:   Near syncope Active Problems:   Essential hypertension   Chronic atrial fibrillation (HCC)   Contusion of multiple sites   Type 2 diabetes mellitus with hyperglycemia [facility])   Discharge Condition: stable  Diet recommendation: heart healthy/diabetic diet  Filed Weights   04/27/15 2056 04/28/15 2100 04/29/15 2054  Weight: 69.491 kg (153 lb 3.2 oz) 70 kg (154 lb 5.2 oz) 71.2 kg (156 lb 15.5 oz)    History of present illness:  From original HPI: 81 y.o. male with history of atrial fibrillation on Apixaban, hypertension, diabetes mellitus was brought to the ER after patient had a fall. Patient states he was active the whole day but late in the evening he felt worn out and weak and suddenly fell but did not loose consciousness. Family found him on the floor after he activated the alert button  Hospital Course:  Near syncope - Etiology uncertain - telemetry monitoring reporting no red flags - PT evaluation recommending SNF  Active Problems:  Essential hypertension - Patient on lisinopril and metoprolol   Chronic atrial fibrillation (HCC) - Currently on eliquis - Continue rate controlling medication metoprolol   Contusion of multiple sites - Secondary to fall, multiple x-rays obtained and no acute fractures reported - CT of knee obtained due to complaints yesterday of worsening discomfort. Reports hematoma but not large effusion at knee   Type 2  diabetes mellitus with hyperglycemia (HCC) - Currently on sliding scale and carb modified diet  Procedures:  None  Consultations:  None  Discharge Exam: Filed Vitals:   04/30/15 0945  BP: 136/89  Pulse: 91  Temp: 97.9 F (36.6 C)  Resp: 20    General: Pt in nad, alert and awake Cardiovascular: s1 and s2 present, no cyanosis  Respiratory: no increased wob on room air, equal chest rise, no wheezes  Discharge Instructions   Discharge Instructions    Call MD for:  difficulty breathing, headache or visual disturbances    Complete by:  As directed      Call MD for:  temperature >100.4    Complete by:  As directed      Diet - low sodium heart healthy    Complete by:  As directed      Discharge instructions    Complete by:  As directed   Please be sure to follow up with your primary care physician in 1-2 weeks or sooner should any new concerns arise.     Increase activity slowly    Complete by:  As directed           Current Discharge Medication List    START taking these medications   Details  traMADol (ULTRAM) 50 MG tablet Take 1 tablet (50 mg total) by mouth every 6 (six) hours as needed for severe pain. Qty: 30 tablet, Refills: 0      CONTINUE these medications which have NOT CHANGED   Details  acetaminophen (TYLENOL) 650 MG CR tablet Take 1,300 mg by mouth daily as  needed (joint pain).     apixaban (ELIQUIS) 5 MG TABS tablet Take 5 mg by mouth 2 (two) times daily.    Calcium Carbonate-Vitamin D 600-200 MG-UNIT TABS Take 1 tablet by mouth 2 (two) times daily. Take 1 tablet daily for vitamin supplement.    Glucosamine-Chondroit-Vit C-Mn (GLUCOSAMINE 1500 COMPLEX) CAPS Take one tablet once a day Qty: 30 each, Refills: 0    lisinopril (PRINIVIL,ZESTRIL) 20 MG tablet Take 20 mg by mouth daily.    metFORMIN (GLUCOPHAGE) 500 MG tablet Take 1 tablet (500 mg total) by mouth daily with breakfast. Qty: 30 tablet, Refills: 3   Associated Diagnoses: Type 2 diabetes  mellitus with stage 3 chronic kidney disease, without long-term current use of insulin (HCC)    metoprolol tartrate (LOPRESSOR) 25 MG tablet TAKE 1 TABLET TWICE DAILY. Qty: 60 tablet, Refills: 3    Multiple Vitamin (MULITIVITAMIN WITH MINERALS) TABS Take 1 tablet by mouth daily.     Omega-3 Fatty Acids (FISH OIL) 1000 MG CAPS Take 1,000 mg by mouth daily.     glucose blood test strip True Test Glucose Test Strips Check blood sugar once daily Dx: E11.9 Qty: 50 each, Refills: 12   Associated Diagnoses: Diabetes mellitus without complication (HCC)    glucose monitoring kit (FREESTYLE) monitoring kit 1 each by Does not apply route as needed for other. Take blood sugar once daily. Qty: 1 each, Refills: 0   Associated Diagnoses: Type II or unspecified type diabetes mellitus without mention of complication, not stated as uncontrolled    simvastatin (ZOCOR) 10 MG tablet TAKE ONE TABLET AT BEDTIME. Qty: 30 tablet, Refills: 5    TRUEPLUS LANCETS 30G MISC Use to check blood sugar once daily. Dx E11.9 Qty: 50 each, Refills: 12      STOP taking these medications     furosemide (LASIX) 20 MG tablet        Allergies  Allergen Reactions  . Morphine And Related Nausea And Vomiting  . Oxycodone Nausea And Vomiting   Follow-up Information    Follow up with [name], JESSICA K, NP.   Specialty:  Geriatric Medicine   Contact information:   [address] [place] [address] [phone]        The results of significant diagnostics from this hospitalization (including imaging, microbiology, ancillary and laboratory) are listed below for reference.    Significant Diagnostic Studies: Dg Chest 2 View  04/26/2015  CLINICAL DATA:  Status post fall. EXAM: CHEST  2 VIEW COMPARISON:  May 19, 2014 FINDINGS: The heart size and mediastinal contours are stable. The heart size is enlarged. There is no focal infiltrate, pulmonary edema, or pleural effusion. There is a question 1.7 cm nodule  in the medial right upper lobe. The visualized skeletal structures are stable. IMPRESSION: No active cardiopulmonary disease. Question 1.7 nodule in the medial right upper lobe. Further evaluation with chest CT on outpatient basis is recommended. Electronically Signed   By: Abelardo Diesel M.D.   On: 04/26/2015 20:03   Ct Head Wo Contrast  04/26/2015  CLINICAL DATA:  81 year old male who fell. Lacerations, hematoma, teeth knocked out, neck pain. Initial encounter. EXAM: CT HEAD WITHOUT CONTRAST CT CERVICAL SPINE WITHOUT CONTRAST TECHNIQUE: Multidetector CT imaging of the head and cervical spine was performed following the standard protocol without intravenous contrast. Multiplanar CT image reconstructions of the cervical spine were also generated. COMPARISON:  05/18/2014. FINDINGS: CT HEAD FINDINGS Visualized paranasal sinuses and mastoids are clear. Right forehead scalp hematoma measuring up  to 12 mm in thickness. Underlying right frontal bone intact. Hyperostosis frontalis No acute osseous abnormality identified. Calcified atherosclerosis at the skull base. Cerebral volume has mildly diminished since 2015. No ventriculomegaly. Asymmetric CSF along the left frontal convexity is incidentally noted and stable, perhaps a small hygroma but appears inconsequential. No midline shift or intracranial mass effect. Heterogeneous hypodensity in the deep gray matter nuclei compatible with chronic lacunar infarcts appear stable. No acute intracranial hemorrhage identified. No cortically based acute infarct identified. No suspicious intracranial vascular hyperdensity. CT CERVICAL SPINE FINDINGS Osteopenia. Chronic multilevel mild spondylolisthesis in the cervical spine most pronounced at C3-C4 (retrolisthesis). Widespread disc space loss. Visualized skull base is intact. No atlanto-occipital dissociation. Stable cervicothoracic junction alignment. Stable bilateral posterior element alignment. No cervical spine fracture  identified. Negative lung apices. Grossly intact visualized upper thoracic levels. Chronic multifactorial degenerative spinal stenosis at C3-C4. Thyromegaly on the right, no discrete thyroid nodule. Mild for age calcified atherosclerosis. Otherwise negative noncontrast paraspinal soft tissues. IMPRESSION: 1. Forehead soft tissue injury without underlying fracture. 2. No acute intracranial abnormality. Chronic small vessel disease. 3. No acute fracture or listhesis identified in the cervical spine. Ligamentous injury is not excluded. 4. Chronic cervical spine spondylolisthesis with degenerative C3-C4 spinal stenosis. Electronically Signed   By: Genevie Ann M.D.   On: 04/26/2015 19:34   Ct Cervical Spine Wo Contrast  04/26/2015  CLINICAL DATA:  81 year old male who fell. Lacerations, hematoma, teeth knocked out, neck pain. Initial encounter. EXAM: CT HEAD WITHOUT CONTRAST CT CERVICAL SPINE WITHOUT CONTRAST TECHNIQUE: Multidetector CT imaging of the head and cervical spine was performed following the standard protocol without intravenous contrast. Multiplanar CT image reconstructions of the cervical spine were also generated. COMPARISON:  05/18/2014. FINDINGS: CT HEAD FINDINGS Visualized paranasal sinuses and mastoids are clear. Right forehead scalp hematoma measuring up to 12 mm in thickness. Underlying right frontal bone intact. Hyperostosis frontalis No acute osseous abnormality identified. Calcified atherosclerosis at the skull base. Cerebral volume has mildly diminished since 2015. No ventriculomegaly. Asymmetric CSF along the left frontal convexity is incidentally noted and stable, perhaps a small hygroma but appears inconsequential. No midline shift or intracranial mass effect. Heterogeneous hypodensity in the deep gray matter nuclei compatible with chronic lacunar infarcts appear stable. No acute intracranial hemorrhage identified. No cortically based acute infarct identified. No suspicious intracranial  vascular hyperdensity. CT CERVICAL SPINE FINDINGS Osteopenia. Chronic multilevel mild spondylolisthesis in the cervical spine most pronounced at C3-C4 (retrolisthesis). Widespread disc space loss. Visualized skull base is intact. No atlanto-occipital dissociation. Stable cervicothoracic junction alignment. Stable bilateral posterior element alignment. No cervical spine fracture identified. Negative lung apices. Grossly intact visualized upper thoracic levels. Chronic multifactorial degenerative spinal stenosis at C3-C4. Thyromegaly on the right, no discrete thyroid nodule. Mild for age calcified atherosclerosis. Otherwise negative noncontrast paraspinal soft tissues. IMPRESSION: 1. Forehead soft tissue injury without underlying fracture. 2. No acute intracranial abnormality. Chronic small vessel disease. 3. No acute fracture or listhesis identified in the cervical spine. Ligamentous injury is not excluded. 4. Chronic cervical spine spondylolisthesis with degenerative C3-C4 spinal stenosis. Electronically Signed   By: Genevie Ann M.D.   On: 04/26/2015 19:34   Ct Knee Left Wo Contrast  04/29/2015  CLINICAL DATA:  Status post fall 04/26/2015 with left knee pain. Subsequent encounter. EXAM: CT OF THE LEFT KNEE WITHOUT CONTRAST TECHNIQUE: Multidetector CT imaging of the left knee was performed according to the standard protocol. Multiplanar CT image reconstructions were also generated. COMPARISON:  None films left knee 04/26/2015  and 05/18/2014. FINDINGS: Bones are osteopenic. No fracture is identified. Osteophytosis is seen about the knee consistent with mild to moderate degenerative disease. A hematoma measuring approximately 3.3 cm AP x 8.1 cm transverse x 12.5 cm craniocaudal is seen over the anterior and lateral aspect of the knee in the subcutaneous tissues. There is only a small joint effusion. No lipohemarthrosis is identified. Cruciate and collateral ligament complexes appear intact. IMPRESSION: Large hematoma  anterior and lateral to the left knee. Negative for fracture. Mild to moderate tricompartmental osteoarthritis. Osteopenia. Small, simple appearing joint effusion. Electronically Signed   By: Inge Rise M.D.   On: 04/29/2015 12:28   Dg Knee Complete 4 Views Left  04/26/2015  CLINICAL DATA:  80 year old male who fell at home. Pain. Initial encounter. EXAM: LEFT KNEE - COMPLETE 4+ VIEW COMPARISON:  05/18/2014. FINDINGS: Advanced tricompartmental degenerative changes in the left knee maximal in the lateral compartment. 2 cm soft tissue swelling anterior to the patella and joint space compatible with superficial hematoma. The patella appears stable and intact. Osteopenia. No acute fracture identified. Calcified peripheral vascular disease. IMPRESSION: Soft tissue injury. Advanced degenerative changes in the left knee with no acute fracture or dislocation identified. Electronically Signed   By: Genevie Ann M.D.   On: 04/26/2015 20:07   Dg Humerus Right  04/26/2015  CLINICAL DATA:  81 year old male with acute right arm pain following fall today. Initial encounter. EXAM: RIGHT HUMERUS - 2+ VIEW COMPARISON:  09/20/2012 radiographs FINDINGS: Right shoulder arthroplasty again noted without evidence of subluxation or dislocation. No acute fracture or complicating hardware features noted. Soft tissue swelling is present. No focal bony lesions are identified. IMPRESSION: Soft tissue swelling without acute bony abnormality. Right shoulder arthroplasty without complicating features. Electronically Signed   By: Margarette Canada M.D.   On: 04/26/2015 20:04   Dg Hip Unilat With Pelvis 2-3 Views Right  04/26/2015  CLINICAL DATA:  81 year old male with acute left hip pain following fall today. Initial encounter. EXAM: DG HIP (WITH OR WITHOUT PELVIS) 2-3V RIGHT COMPARISON:  09/11/2012 radiographs FINDINGS: There is no evidence of hip fracture or dislocation. There is no evidence of arthropathy or other focal bone abnormality.  IMPRESSION: Negative. Electronically Signed   By: Margarette Canada M.D.   On: 04/26/2015 20:07    Microbiology: No results found for this or any previous visit (from the past 240 hour(s)).   Labs: Basic Metabolic Panel:  Recent Labs Lab 04/26/15 2020 04/27/15 0232  NA 133* 129*  K 4.8 4.6  CL 98* 94*  CO2 24 24  GLUCOSE 259* 302*  BUN 41* 38*  CREATININE 1.13 1.17  CALCIUM 9.9 9.1   Liver Function Tests: No results for input(s): AST, ALT, ALKPHOS, BILITOT, PROT, ALBUMIN in the last 168 hours. No results for input(s): LIPASE, AMYLASE in the last 168 hours. No results for input(s): AMMONIA in the last 168 hours. CBC:  Recent Labs Lab 04/26/15 2020 04/27/15 0232 04/28/15 0530 04/29/15 0612  WBC 15.0* 13.9* 9.1 7.8  NEUTROABS 13.5*  --   --   --   HGB 13.9 12.3* 12.0* 11.5*  HCT 42.4 37.7* 37.3* 36.9*  MCV 83.5 84.0 84.2 84.6  PLT 191 190 161 149*   Cardiac Enzymes:  Recent Labs Lab 04/26/15 2020 04/27/15 0232  CKTOTAL  --  6  TROPONINI <0.03  --    BNP: BNP (last 3 results) No results for input(s): BNP in the last 8760 hours.  ProBNP (last 3 results)  Recent Labs  05/18/14 1940  PROBNP 1647.0*    CBG:  Recent Labs Lab 04/29/15 1155 04/29/15 1658 04/29/15 2051 04/30/15 0733 04/30/15 1130  GLUCAP 170* 180* 235* 175* 161*       Signed:  Velvet Bathe  [facility] 04/30/2015, 4:19 PM

## 2015-05-01 LAB — GLUCOSE, CAPILLARY
Glucose-Capillary: 156 mg/dL — ABNORMAL HIGH (ref 65–99)
Glucose-Capillary: 179 mg/dL — ABNORMAL HIGH (ref 65–99)

## 2015-05-01 MED ORDER — ACETAMINOPHEN 325 MG PO TABS: 650.0000 mg | ORAL_TABLET | Freq: Four times a day (QID) | ORAL | Status: DC | PRN

## 2015-05-01 NOTE — Clinical Social Work Placement (Addendum)
   CLINICAL SOCIAL WORK PLACEMENT  NOTE PATIENT DISCHARGED TO [facility] ON 05/01/15  Date:  05/01/2015  Patient Details  Name: Logan Bean MRN: [id number] Date of Birth: 1920/11/01  Clinical Social Work is seeking post-discharge placement for this patient at the [place] level of care (*CSW will initial, date and re-position this form in  chart as items are completed):  No   Patient/family provided with [facility] Work Department's list of facilities offering this level of care within the geographic area requested by the patient (or if unable, by the patient's family).  Yes   Patient/family informed of their freedom to choose among providers that offer the needed level of care, that participate in Medicare, Medicaid or managed care program needed by the patient, have an available bed and are willing to accept the patient.  No   Patient/family informed of [facility]'s ownership interest in [facility] and [facility], as well as of the fact that they are under no obligation to receive care at these facilities.  PASRR submitted to EDS on       PASRR number received on       Existing PASRR number confirmed on 04/30/15     FL2 transmitted to all facilities in geographic area requested by pt/family on       FL2 transmitted to all facilities within larger geographic area on 04/30/15     Patient informed that his/her managed care company has contracts with or will negotiate with certain facilities, including the following:        Yes (By phone on 04/30/15)   Patient/family informed of bed offers received.  Patient chooses bed at [facility]     Physician recommends and patient chooses bed at      Patient to be transferred to [facility] on 05/01/15.  Patient to be transferred to facility by [facility]ulance [facility])     Patient family notified on 05/01/15 of transfer.  Name of family member notified:  Leslee Home, husband of POA, Tonye Becket.  PHYSICIAN       Additional Comment:    _______________________________________________ Sable Feil, LCSW 05/01/2015, 4:37 PM

## 2015-05-01 NOTE — Progress Notes (Signed)
Pt prepared for d/c to SNF. IV d/c'd. Skin intact except as charted in most recent assessments. Vitals are stable. Report called to [place] at [facility] (receiving facility). Pt to be transported by family.  Jillyn Ledger, MBA, BS, RN

## 2015-05-01 NOTE — Care Management Note (Signed)
Case Management Note  Patient Details  Name: Logan Bean MRN: [id number] Date of Birth: 01/13/1921  Subjective/Objective:         Falls, near syncope           Action/Plan: NCM spoke to [facility], Tonye Becket # [phone], states they are at [facility] to make payment for him to go to rehab. NCM made CSW aware and will make arrangements for dc to SNF-rehab today. No additional NCM needs identified.    Expected Discharge Date:  04/30/15               Expected Discharge Plan:  [place]  In-House Referral:  Clinical Social Work  Discharge planning Services  CM Consult  Status of Service:  Completed, signed off  Medicare Important Message Given:    Date Medicare IM Given:    Medicare IM give by:    Date Additional Medicare IM Given:    Additional Medicare Important Message give by:     If discussed at [place] of Stay Meetings, dates discussed:    Additional Comments:  Erenest Rasher, RN 05/01/2015, 10:36 AM

## 2015-05-02 ENCOUNTER — Encounter: Payer: Self-pay | Admitting: Adult Health

## 2015-05-02 ENCOUNTER — Non-Acute Institutional Stay (SKILLED_NURSING_FACILITY): Payer: Medicare Other | Admitting: Adult Health

## 2015-05-02 ENCOUNTER — Other Ambulatory Visit: Payer: Self-pay | Admitting: *Deleted

## 2015-05-02 DIAGNOSIS — M6281 Muscle weakness (generalized): Secondary | ICD-10-CM | POA: Diagnosis not present

## 2015-05-02 DIAGNOSIS — E1122 Type 2 diabetes mellitus with diabetic chronic kidney disease: Secondary | ICD-10-CM

## 2015-05-02 DIAGNOSIS — E785 Hyperlipidemia, unspecified: Secondary | ICD-10-CM

## 2015-05-02 DIAGNOSIS — I10 Essential (primary) hypertension: Secondary | ICD-10-CM

## 2015-05-02 DIAGNOSIS — R55 Syncope and collapse: Secondary | ICD-10-CM | POA: Diagnosis not present

## 2015-05-02 DIAGNOSIS — N183 Chronic kidney disease, stage 3 unspecified: Secondary | ICD-10-CM

## 2015-05-02 DIAGNOSIS — M25562 Pain in left knee: Secondary | ICD-10-CM | POA: Diagnosis not present

## 2015-05-02 DIAGNOSIS — I482 Chronic atrial fibrillation, unspecified: Secondary | ICD-10-CM

## 2015-05-02 DIAGNOSIS — M79605 Pain in left leg: Secondary | ICD-10-CM | POA: Diagnosis not present

## 2015-05-02 DIAGNOSIS — R2681 Unsteadiness on feet: Secondary | ICD-10-CM | POA: Diagnosis not present

## 2015-05-02 DIAGNOSIS — Z9181 History of falling: Secondary | ICD-10-CM | POA: Diagnosis not present

## 2015-05-02 MED ORDER — TRAMADOL HCL 50 MG PO TABS: 50.0000 mg | ORAL_TABLET | Freq: Four times a day (QID) | ORAL | Status: DC | PRN

## 2015-05-03 DIAGNOSIS — M25562 Pain in left knee: Secondary | ICD-10-CM | POA: Diagnosis not present

## 2015-05-03 DIAGNOSIS — M6281 Muscle weakness (generalized): Secondary | ICD-10-CM | POA: Diagnosis not present

## 2015-05-03 DIAGNOSIS — Z9181 History of falling: Secondary | ICD-10-CM | POA: Diagnosis not present

## 2015-05-03 DIAGNOSIS — M79605 Pain in left leg: Secondary | ICD-10-CM | POA: Diagnosis not present

## 2015-05-03 DIAGNOSIS — I10 Essential (primary) hypertension: Secondary | ICD-10-CM | POA: Diagnosis not present

## 2015-05-03 DIAGNOSIS — R2681 Unsteadiness on feet: Secondary | ICD-10-CM | POA: Diagnosis not present

## 2015-05-04 ENCOUNTER — Non-Acute Institutional Stay (SKILLED_NURSING_FACILITY): Payer: Medicare Other | Admitting: Internal Medicine

## 2015-05-04 DIAGNOSIS — D62 Acute posthemorrhagic anemia: Secondary | ICD-10-CM | POA: Diagnosis not present

## 2015-05-04 DIAGNOSIS — H43391 Other vitreous opacities, right eye: Secondary | ICD-10-CM | POA: Diagnosis not present

## 2015-05-04 DIAGNOSIS — I4891 Unspecified atrial fibrillation: Secondary | ICD-10-CM | POA: Diagnosis not present

## 2015-05-04 DIAGNOSIS — E871 Hypo-osmolality and hyponatremia: Secondary | ICD-10-CM

## 2015-05-04 DIAGNOSIS — E1122 Type 2 diabetes mellitus with diabetic chronic kidney disease: Secondary | ICD-10-CM

## 2015-05-04 DIAGNOSIS — K5901 Slow transit constipation: Secondary | ICD-10-CM

## 2015-05-04 DIAGNOSIS — R5381 Other malaise: Secondary | ICD-10-CM | POA: Diagnosis not present

## 2015-05-04 DIAGNOSIS — M79605 Pain in left leg: Secondary | ICD-10-CM | POA: Diagnosis not present

## 2015-05-04 DIAGNOSIS — E785 Hyperlipidemia, unspecified: Secondary | ICD-10-CM

## 2015-05-04 DIAGNOSIS — M25562 Pain in left knee: Secondary | ICD-10-CM

## 2015-05-04 DIAGNOSIS — N183 Chronic kidney disease, stage 3 unspecified: Secondary | ICD-10-CM

## 2015-05-04 DIAGNOSIS — I10 Essential (primary) hypertension: Secondary | ICD-10-CM

## 2015-05-04 DIAGNOSIS — M6281 Muscle weakness (generalized): Secondary | ICD-10-CM | POA: Diagnosis not present

## 2015-05-04 DIAGNOSIS — R2681 Unsteadiness on feet: Secondary | ICD-10-CM

## 2015-05-04 DIAGNOSIS — Z9181 History of falling: Secondary | ICD-10-CM | POA: Diagnosis not present

## 2015-05-04 NOTE — Progress Notes (Signed)
Patient ID: Logan Bean, male   DOB: 15-Apr-1921, 81 y.o.   MRN: [id number]     [facility]   PCP: Lauree Chandler, NP  Code Status: DNR  Allergies  Allergen Reactions  . Morphine And Related Nausea And Vomiting  . Oxycodone Nausea And Vomiting    Chief Complaint  Patient presents with  . New Admit To SNF     HPI:  81 y.o. patient is here for short term rehabilitation post hospital admission from 04/26/15-04/30/15 with near syncope and fall. xrays were obtained and showed no acute fractures. Ct of the knee showed hematoma but no large effusion. He has PMH of afib, HTN, DM. He is seen in his room today. He denies any concerns. He has been working with therapy team. He gets short of breath with exertion. Pain is under control with current pain regimen.  Review of Systems:  Constitutional: Negative for fever, chills, diaphoresis.  HENT: Negative for headache, congestion, nasal discharge Eyes: Negative for eye pain, double vision and discharge. was having flashes of lights seen in front of his eyes until yesterday and is seeing ophthalmology today. No eye/ vision concerns at present Respiratory: Negative for cough, shortness of breath and wheezing.   Cardiovascular: Negative for chest pain, palpitations. Positive for leg swelling.  Gastrointestinal: Negative for heartburn, nausea, vomiting, abdominal pain. + constipation Genitourinary: Negative for dysuria, flank pain.  Musculoskeletal: Negative for back pain, falls Skin: Negative for itching, rash.  Neurological: Negative for dizziness, tingling, focal weakness Psychiatric/Behavioral: Negative for depression    Past Medical History  Diagnosis Date  . Hypertension   . Hypercholesteremia   . Stomach ulcer     years  . H/O hiatal hernia   . Varicose veins     both legs  . Arthritis     left knee  . Diabetes mellitus     diet controlled, pt does not check cbg  . Left rotator cuff tear  Jul 12, 2012  . Edema   . Unspecified hereditary and idiopathic peripheral neuropathy   . Dizziness and giddiness   . Unspecified vitamin D deficiency   . Diaphragmatic hernia without mention of obstruction or gangrene    Past Surgical History  Procedure Laterality Date  . Esopagus strectching  2003 and 3 yrs ago  . Tonsillectomy  age 36  . Shoulder open rotator cuff repair  11/05/2011    Procedure: ROTATOR CUFF REPAIR SHOULDER OPEN;  Surgeon: Tobi Bastos, MD;  Location: [facility];  Service: Orthopedics;  Laterality: Left;  Left Shoulder Rotator Cuff Repair Open with Graft and Anchors  . Reverse shoulder arthroplasty Right 09/17/2012    Procedure: REVERSE SHOULDER ARTHROPLASTY;  Surgeon: Augustin Schooling, MD;  Location: [place];  Service: Orthopedics;  Laterality: Right;  . Orif ankle fracture Right 09/20/2012    Procedure: OPEN REDUCTION INTERNAL FIXATION (ORIF) ANKLE FRACTURE;  Surgeon: Johnn Hai, MD;  Location: [facility];  Service: Orthopedics;  Laterality: Right;  . Mastectomy      bilateral  . Cardioversion N/A 02/22/2013    Procedure: CARDIOVERSION;  Surgeon: Darlin Coco, MD;  Location: [facility];  Service: Cardiovascular;  Laterality: N/A;   Social History:   reports that he quit smoking about 18 years ago. His smoking use included Pipe and Cigarettes. He quit after 50 years of use. He has never used smokeless tobacco. He reports that he drinks alcohol. He reports that he does not use illicit drugs.  Family  History  Problem Relation Age of Onset  . Parkinson's disease Mother   . Hip fracture Mother   . Heart disease Mother   . Alzheimer's disease Mother   . Heart disease Father   . Parkinson's disease Sister   . Heart disease Brother     Medications:   Medication List       This list is accurate as of: 05/04/15  9:15 AM.  Always use your most recent med list.               acetaminophen 325 MG tablet  Commonly known as:  TYLENOL  Take 2 tablets (650 mg  total) by mouth every 6 (six) hours as needed for mild pain (or Fever >/= 101).     Calcium Carbonate-Vitamin D 600-200 MG-UNIT Tabs  Take 1 tablet by mouth 2 (two) times daily. Take 1 tablet daily for vitamin supplement.     ELIQUIS 5 MG Tabs tablet  Generic drug:  apixaban  Take 5 mg by mouth 2 (two) times daily.     Fish Oil 1000 MG Caps  Take 1,000 mg by mouth daily.     GLUCOSAMINE 1500 COMPLEX Caps  Take one tablet once a day     glucose blood test strip  True Test Glucose Test Strips Check blood sugar once daily Dx: E11.9     glucose monitoring kit monitoring kit  1 each by Does not apply route as needed for other. Take blood sugar once daily.     lisinopril 20 MG tablet  Commonly known as:  PRINIVIL,ZESTRIL  Take 20 mg by mouth daily.     metFORMIN 500 MG tablet  Commonly known as:  GLUCOPHAGE  Take 1 tablet (500 mg total) by mouth daily with breakfast.     metoprolol tartrate 25 MG tablet  Commonly known as:  LOPRESSOR  TAKE 1 TABLET TWICE DAILY.     multivitamin with minerals Tabs tablet  Take 1 tablet by mouth daily.     simvastatin 10 MG tablet  Commonly known as:  ZOCOR  TAKE ONE TABLET AT BEDTIME.     traMADol 50 MG tablet  Commonly known as:  ULTRAM  Take 1 tablet (50 mg total) by mouth every 6 (six) hours as needed for severe pain.     TRUEPLUS LANCETS 30G Misc  Use to check blood sugar once daily. Dx E11.9         Physical Exam: Filed Vitals:   05/04/15 0914  BP: 113/65  Pulse: 84  Temp: 97.8 F (36.6 C)  Resp: 18  SpO2: 95%    General- elderly frail male, well built, in no acute distress Head- normocephalic, atraumatic Nose- normal nasal mucosa, no maxillary or frontal sinus tenderness, no nasal discharge Throat- moist mucus membrane, normal oropharynx Eyes- PERRLA, EOMI, no pallor, no icterus, no discharge, normal conjunctiva, normal sclera Neck- no cervical lymphadenopathy Cardiovascular- irregular heart rate, palpable dorsalis  pedis and radial pulses, trace right and 1+ left leg edema Respiratory- bilateral clear to auscultation, no wheeze, no rhonchi, no crackles, no use of accessory muscles Abdomen- bowel sounds present, soft, non tender Musculoskeletal- able to move all 4 extremities, generalized weakness with ROM limited in left knee. unsteady gait and using walker Neurological- no focal deficit, alert and oriented to person, place and time Skin- warm and dry, multiple bruises to face on right side and left leg with abrasion to left knee, dressing in place Psychiatry- normal mood and affect  Labs reviewed: Basic Metabolic Panel:  Recent Labs  04/19/15 1047 04/26/15 2020 04/27/15 0232  NA 142 133* 129*  K 4.6 4.8 4.6  CL 103 98* 94*  CO2 _0 GLUCOSE 146* 259* 302*  BUN 26 41* 38*  CREATININE 1.05 1.13 1.17  CALCIUM 9.7 9.9 9.1   Liver Function Tests:  Recent Labs  05/11/14 1330 05/18/14 1940 12/08/14 1102  AST _1 ALT _2 ALKPHOS 99 114 94  BILITOT 0.7 0.9 0.7  PROT 6.1 7.3 6.2  ALBUMIN 4.1 3.9 4.1   No results for input(s): LIPASE, AMYLASE in the last 8760 hours. No results for input(s): AMMONIA in the last 8760 hours. CBC:  Recent Labs  12/08/14 1102 04/19/15 1047 04/26/15 2020 04/27/15 0232 04/28/15 0530 04/29/15 0612  WBC 11.5* 6.2 15.0* 13.9* 9.1 7.8  NEUTROABS 10.0* 3.8 13.5*  --   --   --   HGB  --   --  13.9 12.3* 12.0* 11.5*  HCT 39.1 43.2 42.4 37.7* 37.3* 36.9*  MCV  --   --  83.5 84.0 84.2 84.6  PLT  --   --  191 190 161 149*   Cardiac Enzymes:  Recent Labs  05/19/14 0510 05/19/14 1216 04/26/15 2020 04/27/15 0232  CKTOTAL  --   --   --  12  TROPONINI <0.30 <0.30 <0.03  --    BNP: Invalid input(s): POCBNP CBG:  Recent Labs  04/30/15 2155 05/01/15 0753 05/01/15 1148  GLUCAP 163* 156* 179*    Radiological Exams: Dg Chest 2 View  04/26/2015  CLINICAL DATA:  Status post fall. EXAM: CHEST  2 VIEW COMPARISON:  May 19, 2014  FINDINGS: The heart size and mediastinal contours are stable. The heart size is enlarged. There is no focal infiltrate, pulmonary edema, or pleural effusion. There is a question 1.7 cm nodule in the medial right upper lobe. The visualized skeletal structures are stable. IMPRESSION: No active cardiopulmonary disease. Question 1.7 nodule in the medial right upper lobe. Further evaluation with chest CT on outpatient basis is recommended. Electronically Signed   By: Abelardo Diesel M.D.   On: 04/26/2015 20:03   Ct Head Wo Contrast  04/26/2015  CLINICAL DATA:  81 year old male who fell. Lacerations, hematoma, teeth knocked out, neck pain. Initial encounter. EXAM: CT HEAD WITHOUT CONTRAST CT CERVICAL SPINE WITHOUT CONTRAST TECHNIQUE: Multidetector CT imaging of the head and cervical spine was performed following the standard protocol without intravenous contrast. Multiplanar CT image reconstructions of the cervical spine were also generated. COMPARISON:  05/18/2014. FINDINGS: CT HEAD FINDINGS Visualized paranasal sinuses and mastoids are clear. Right forehead scalp hematoma measuring up to 12 mm in thickness. Underlying right frontal bone intact. Hyperostosis frontalis No acute osseous abnormality identified. Calcified atherosclerosis at the skull base. Cerebral volume has mildly diminished since 2015. No ventriculomegaly. Asymmetric CSF along the left frontal convexity is incidentally noted and stable, perhaps a small hygroma but appears inconsequential. No midline shift or intracranial mass effect. Heterogeneous hypodensity in the deep gray matter nuclei compatible with chronic lacunar infarcts appear stable. No acute intracranial hemorrhage identified. No cortically based acute infarct identified. No suspicious intracranial vascular hyperdensity. CT CERVICAL SPINE FINDINGS Osteopenia. Chronic multilevel mild spondylolisthesis in the cervical spine most pronounced at C3-C4 (retrolisthesis). Widespread disc space loss.  Visualized skull base is intact. No atlanto-occipital dissociation. Stable cervicothoracic junction alignment. Stable bilateral posterior element alignment. No cervical spine fracture identified. Negative lung apices. Grossly intact visualized upper  thoracic levels. Chronic multifactorial degenerative spinal stenosis at C3-C4. Thyromegaly on the right, no discrete thyroid nodule. Mild for age calcified atherosclerosis. Otherwise negative noncontrast paraspinal soft tissues. IMPRESSION: 1. Forehead soft tissue injury without underlying fracture. 2. No acute intracranial abnormality. Chronic small vessel disease. 3. No acute fracture or listhesis identified in the cervical spine. Ligamentous injury is not excluded. 4. Chronic cervical spine spondylolisthesis with degenerative C3-C4 spinal stenosis. Electronically Signed   By: Genevie Ann M.D.   On: 04/26/2015 19:34   Ct Cervical Spine Wo Contrast  04/26/2015  CLINICAL DATA:  81 year old male who fell. Lacerations, hematoma, teeth knocked out, neck pain. Initial encounter. EXAM: CT HEAD WITHOUT CONTRAST CT CERVICAL SPINE WITHOUT CONTRAST TECHNIQUE: Multidetector CT imaging of the head and cervical spine was performed following the standard protocol without intravenous contrast. Multiplanar CT image reconstructions of the cervical spine were also generated. COMPARISON:  05/18/2014. FINDINGS: CT HEAD FINDINGS Visualized paranasal sinuses and mastoids are clear. Right forehead scalp hematoma measuring up to 12 mm in thickness. Underlying right frontal bone intact. Hyperostosis frontalis No acute osseous abnormality identified. Calcified atherosclerosis at the skull base. Cerebral volume has mildly diminished since 2015. No ventriculomegaly. Asymmetric CSF along the left frontal convexity is incidentally noted and stable, perhaps a small hygroma but appears inconsequential. No midline shift or intracranial mass effect. Heterogeneous hypodensity in the deep gray matter  nuclei compatible with chronic lacunar infarcts appear stable. No acute intracranial hemorrhage identified. No cortically based acute infarct identified. No suspicious intracranial vascular hyperdensity. CT CERVICAL SPINE FINDINGS Osteopenia. Chronic multilevel mild spondylolisthesis in the cervical spine most pronounced at C3-C4 (retrolisthesis). Widespread disc space loss. Visualized skull base is intact. No atlanto-occipital dissociation. Stable cervicothoracic junction alignment. Stable bilateral posterior element alignment. No cervical spine fracture identified. Negative lung apices. Grossly intact visualized upper thoracic levels. Chronic multifactorial degenerative spinal stenosis at C3-C4. Thyromegaly on the right, no discrete thyroid nodule. Mild for age calcified atherosclerosis. Otherwise negative noncontrast paraspinal soft tissues. IMPRESSION: 1. Forehead soft tissue injury without underlying fracture. 2. No acute intracranial abnormality. Chronic small vessel disease. 3. No acute fracture or listhesis identified in the cervical spine. Ligamentous injury is not excluded. 4. Chronic cervical spine spondylolisthesis with degenerative C3-C4 spinal stenosis. Electronically Signed   By: Genevie Ann M.D.   On: 04/26/2015 19:34   Dg Knee Complete 4 Views Left  04/26/2015  CLINICAL DATA:  81 year old male who fell at home. Pain. Initial encounter. EXAM: LEFT KNEE - COMPLETE 4+ VIEW COMPARISON:  05/18/2014. FINDINGS: Advanced tricompartmental degenerative changes in the left knee maximal in the lateral compartment. 2 cm soft tissue swelling anterior to the patella and joint space compatible with superficial hematoma. The patella appears stable and intact. Osteopenia. No acute fracture identified. Calcified peripheral vascular disease. IMPRESSION: Soft tissue injury. Advanced degenerative changes in the left knee with no acute fracture or dislocation identified. Electronically Signed   By: Genevie Ann M.D.   On:  04/26/2015 20:07   Dg Humerus Right  04/26/2015  CLINICAL DATA:  81 year old male with acute right arm pain following fall today. Initial encounter. EXAM: RIGHT HUMERUS - 2+ VIEW COMPARISON:  09/20/2012 radiographs FINDINGS: Right shoulder arthroplasty again noted without evidence of subluxation or dislocation. No acute fracture or complicating hardware features noted. Soft tissue swelling is present. No focal bony lesions are identified. IMPRESSION: Soft tissue swelling without acute bony abnormality. Right shoulder arthroplasty without complicating features. Electronically Signed   By: Margarette Canada M.D.   On:  04/26/2015 20:04   Dg Hip Unilat With Pelvis 2-3 Views Right  04/26/2015  CLINICAL DATA:  81 year old male with acute left hip pain following fall today. Initial encounter. EXAM: DG HIP (WITH OR WITHOUT PELVIS) 2-3V RIGHT COMPARISON:  09/11/2012 radiographs FINDINGS: There is no evidence of hip fracture or dislocation. There is no evidence of arthropathy or other focal bone abnormality. IMPRESSION: Negative. Electronically Signed   By: Margarette Canada M.D.   On: 04/26/2015 20:07     Assessment/Plan  Physical deconditioning Will have him work with physical therapy and occupational therapy team to help with gait training and muscle strengthening exercises.fall precautions. Skin care. Encourage to be out of bed.   Unsteady gait Will have patient work with PT/OT as tolerated to regain strength and restore function.  Fall precautions are in place.  Blood loss anemia Post op and from fall with bruising/ hematoma, monitor h&h  Left knee pain Post fall with trauma, has hematoma. Continue skin care. Continue tylenol and tramadol prn pain   Constipation Add senna s 1 tab qhs and monitor, hydration encouraged  afib Rate controlled. Continue metoprolol tartrate 25 mg bid and eliquis 5 mg bid.   Hyponatremia Alert and oriented at present. Monitor bmp  Hypertension Controlled readings.  Continue lisinopril 20 mg daily and metoprolol tartrate 25 mg bid and monitor bp  Diabetes mellitus, type II  hemoglobin A1c 7.7. continue Glucophage 500 mg daily, monitor cbg. Continue lisinopril and simvastatin  Hyperlipidemia continue Zocor 10 mg daily    Goals of care: short term rehabilitation   Labs/tests ordered: cbc, bmp 05/07/15  Family/ staff Communication: reviewed care plan with patient and nursing supervisor    Blanchie Serve, MD  [facility] [phone] (Monday-Friday 8 am - 5 pm) [phone] (afterhours)

## 2015-05-04 NOTE — Progress Notes (Signed)
Patient ID: Logan Bean, male   DOB: 10-07-1920, 81 y.o.   MRN: [id number]    DATE:  05/02/15  MRN:  [id number]  BIRTHDAY: Aug 30, 1920  Facility:  [facility] Location:  [facility] [facility] [place] Room Number: 644-I  LEVEL OF CARE:  SNF (31)  Contact Information    Name Relation Home Work Friant [phone]  [phone]   Charlyne Petrin   [phone]       Chief Complaint  Patient presents with  . Hospitalization Follow-up    There is single., Hypertension, chronic atrial fibrillation, diabetes mellitus and hyperlipidemia    HISTORY OF PRESENT ILLNESS:  This is a 81 year old male who was been admitted to [facility] on 05/01/15 from [facility]. He has PMH of atrial fibrillation on Apixaban, hypertension and diabetes mellitus. He had a fall at home. Family found him on the floor and brought to the ER. Multiple x-rays obtained in no acute fractures reported. CT of the knee obtained showed hematoma.  He has been admitted for a short-term rehabilitation.   PAST MEDICAL HISTORY:  Past Medical History  Diagnosis Date  . Hypertension   . Hypercholesteremia   . Stomach ulcer     years  . H/O hiatal hernia   . Varicose veins     both legs  . Arthritis     left knee  . Diabetes mellitus     diet controlled, pt does not check cbg  . Left rotator cuff tear Jul 12, 2012  . Edema   . Unspecified hereditary and idiopathic peripheral neuropathy   . Dizziness and giddiness   . Unspecified vitamin D deficiency   . Diaphragmatic hernia without mention of obstruction or gangrene      CURRENT MEDICATIONS: Reviewed  Patient's Medications  New Prescriptions   No medications on file  Previous Medications   ACETAMINOPHEN (TYLENOL) 325 MG TABLET    Take 2 tablets (650 mg total) by mouth every 6 (six) hours as needed for mild pain (or Fever >/= 101).   APIXABAN (ELIQUIS) 5 MG TABS TABLET    Take 5 mg by mouth 2 (two) times  daily.   CALCIUM CARBONATE-VITAMIN D 600-200 MG-UNIT TABS    Take 1 tablet by mouth 2 (two) times daily. Take 1 tablet daily for vitamin supplement.   GLUCOSAMINE-CHONDROIT-VIT C-MN (GLUCOSAMINE 1500 COMPLEX) CAPS    Take one tablet once a day   GLUCOSE BLOOD TEST STRIP    True Test Glucose Test Strips Check blood sugar once daily Dx: E11.9   GLUCOSE MONITORING KIT (FREESTYLE) MONITORING KIT    1 each by Does not apply route as needed for other. Take blood sugar once daily.   LISINOPRIL (PRINIVIL,ZESTRIL) 20 MG TABLET    Take 20 mg by mouth daily.   METFORMIN (GLUCOPHAGE) 500 MG TABLET    Take 1 tablet (500 mg total) by mouth daily with breakfast.   METOPROLOL TARTRATE (LOPRESSOR) 25 MG TABLET    TAKE 1 TABLET TWICE DAILY.   MULTIPLE VITAMIN (MULITIVITAMIN WITH MINERALS) TABS    Take 1 tablet by mouth daily.    OMEGA-3 FATTY ACIDS (FISH OIL) 1000 MG CAPS    Take 1,000 mg by mouth daily.    SIMVASTATIN (ZOCOR) 10 MG TABLET    TAKE ONE TABLET AT BEDTIME.   TRAMADOL (ULTRAM) 50 MG TABLET    Take 1 tablet (50 mg total) by mouth every 6 (six) hours as needed for severe  pain.   TRUEPLUS LANCETS 30G MISC    Use to check blood sugar once daily. Dx E11.9  Modified Medications   No medications on file  Discontinued Medications   No medications on file     Allergies  Allergen Reactions  . Morphine And Related Nausea And Vomiting  . Oxycodone Nausea And Vomiting     REVIEW OF SYSTEMS:  GENERAL: no change in appetite, no fatigue, no weight changes, no fever, chills or weakness EYES: Denies change in vision, dry eyes, eye pain, itching or discharge EARS: Denies change in hearing, ringing in ears, or earache NOSE: Denies nasal congestion or epistaxis MOUTH and THROAT: Denies oral discomfort, gingival pain or bleeding, pain from teeth or hoarseness   RESPIRATORY: no cough, SOB, DOE, wheezing, hemoptysis CARDIAC: no chest pain, edema or palpitations GI: no abdominal pain, diarrhea, constipation,  heart burn, nausea or vomiting GU: Denies dysuria, frequency, hematuria, incontinence, or discharge PSYCHIATRIC: Denies feeling of depression or anxiety. No report of hallucinations, insomnia, paranoia, or agitation   PHYSICAL EXAMINATION  GENERAL APPEARANCE: Well nourished. In no acute distress. Normal body habitus SKIN:  Right eye area bruising, RUE and LLE bruising HEAD: Normal in size and contour. No evidence of trauma EYES: Lids open and close normally. No blepharitis, entropion or ectropion. PERRL. Conjunctivae are clear and sclerae are white. Lenses are without opacity EARS: Pinnae are normal. Patient hears normal voice tunes of the examiner MOUTH and THROAT: Lips are without lesions. Oral mucosa is moist and without lesions. Tongue is normal in shape, size, and color and without lesions NECK: supple, trachea midline, no neck masses, no thyroid tenderness, no thyromegaly LYMPHATICS: no LAN in the neck, no supraclavicular LAN RESPIRATORY: breathing is even & unlabored, BS CTAB CARDIAC: RRR, no murmur,no extra heart sounds, no edema GI: abdomen soft, normal BS, no masses, no tenderness, no hepatomegaly, no splenomegaly EXTREMITIES:  Able to move 4 extremities PSYCHIATRIC: Alert and oriented X 3. Affect and behavior are appropriate  LABS/RADIOLOGY: Labs reviewed: Basic Metabolic Panel:  Recent Labs  04/19/15 1047 04/26/15 2020 04/27/15 0232  NA 142 133* 129*  K 4.6 4.8 4.6  CL 103 98* 94*  CO2 '23 24 24  ' GLUCOSE 146* 259* 302*  BUN 26 41* 38*  CREATININE 1.05 1.13 1.17  CALCIUM 9.7 9.9 9.1   Liver Function Tests:  Recent Labs  05/11/14 1330 05/18/14 1940 12/08/14 1102  AST '18 27 20  ' ALT '16 20 22  ' ALKPHOS 99 114 94  BILITOT 0.7 0.9 0.7  PROT 6.1 7.3 6.2  ALBUMIN 4.1 3.9 4.1   CBC:  Recent Labs  12/08/14 1102 04/19/15 1047 04/26/15 2020 04/27/15 0232 04/28/15 0530 04/29/15 0612  WBC 11.5* 6.2 15.0* 13.9* 9.1 7.8  NEUTROABS 10.0* 3.8 13.5*  --   --    --   HGB  --   --  13.9 12.3* 12.0* 11.5*  HCT 39.1 43.2 42.4 37.7* 37.3* 36.9*  MCV  --   --  83.5 84.0 84.2 84.6  PLT  --   --  191 190 161 149*   Lipid Panel:  Recent Labs  05/11/14 1330 12/08/14 1102  HDL 43 53   Cardiac Enzymes:  Recent Labs  05/19/14 0510 05/19/14 1216 04/26/15 2020 04/27/15 0232  CKTOTAL  --   --   --  39  TROPONINI <0.30 <0.30 <0.03  --    CBG:  Recent Labs  04/30/15 2155 05/01/15 0753 05/01/15 1148  GLUCAP 163* 156* 179*  Dg Chest 2 View  04/26/2015  CLINICAL DATA:  Status post fall. EXAM: CHEST  2 VIEW COMPARISON:  May 19, 2014 FINDINGS: The heart size and mediastinal contours are stable. The heart size is enlarged. There is no focal infiltrate, pulmonary edema, or pleural effusion. There is a question 1.7 cm nodule in the medial right upper lobe. The visualized skeletal structures are stable. IMPRESSION: No active cardiopulmonary disease. Question 1.7 nodule in the medial right upper lobe. Further evaluation with chest CT on outpatient basis is recommended. Electronically Signed   By: Abelardo Diesel M.D.   On: 04/26/2015 20:03   Ct Head Wo Contrast  04/26/2015  CLINICAL DATA:  81 year old male who fell. Lacerations, hematoma, teeth knocked out, neck pain. Initial encounter. EXAM: CT HEAD WITHOUT CONTRAST CT CERVICAL SPINE WITHOUT CONTRAST TECHNIQUE: Multidetector CT imaging of the head and cervical spine was performed following the standard protocol without intravenous contrast. Multiplanar CT image reconstructions of the cervical spine were also generated. COMPARISON:  05/18/2014. FINDINGS: CT HEAD FINDINGS Visualized paranasal sinuses and mastoids are clear. Right forehead scalp hematoma measuring up to 12 mm in thickness. Underlying right frontal bone intact. Hyperostosis frontalis No acute osseous abnormality identified. Calcified atherosclerosis at the skull base. Cerebral volume has mildly diminished since 2015. No ventriculomegaly.  Asymmetric CSF along the left frontal convexity is incidentally noted and stable, perhaps a small hygroma but appears inconsequential. No midline shift or intracranial mass effect. Heterogeneous hypodensity in the deep gray matter nuclei compatible with chronic lacunar infarcts appear stable. No acute intracranial hemorrhage identified. No cortically based acute infarct identified. No suspicious intracranial vascular hyperdensity. CT CERVICAL SPINE FINDINGS Osteopenia. Chronic multilevel mild spondylolisthesis in the cervical spine most pronounced at C3-C4 (retrolisthesis). Widespread disc space loss. Visualized skull base is intact. No atlanto-occipital dissociation. Stable cervicothoracic junction alignment. Stable bilateral posterior element alignment. No cervical spine fracture identified. Negative lung apices. Grossly intact visualized upper thoracic levels. Chronic multifactorial degenerative spinal stenosis at C3-C4. Thyromegaly on the right, no discrete thyroid nodule. Mild for age calcified atherosclerosis. Otherwise negative noncontrast paraspinal soft tissues. IMPRESSION: 1. Forehead soft tissue injury without underlying fracture. 2. No acute intracranial abnormality. Chronic small vessel disease. 3. No acute fracture or listhesis identified in the cervical spine. Ligamentous injury is not excluded. 4. Chronic cervical spine spondylolisthesis with degenerative C3-C4 spinal stenosis. Electronically Signed   By: Genevie Ann M.D.   On: 04/26/2015 19:34   Ct Cervical Spine Wo Contrast  04/26/2015  CLINICAL DATA:  81 year old male who fell. Lacerations, hematoma, teeth knocked out, neck pain. Initial encounter. EXAM: CT HEAD WITHOUT CONTRAST CT CERVICAL SPINE WITHOUT CONTRAST TECHNIQUE: Multidetector CT imaging of the head and cervical spine was performed following the standard protocol without intravenous contrast. Multiplanar CT image reconstructions of the cervical spine were also generated. COMPARISON:   05/18/2014. FINDINGS: CT HEAD FINDINGS Visualized paranasal sinuses and mastoids are clear. Right forehead scalp hematoma measuring up to 12 mm in thickness. Underlying right frontal bone intact. Hyperostosis frontalis No acute osseous abnormality identified. Calcified atherosclerosis at the skull base. Cerebral volume has mildly diminished since 2015. No ventriculomegaly. Asymmetric CSF along the left frontal convexity is incidentally noted and stable, perhaps a small hygroma but appears inconsequential. No midline shift or intracranial mass effect. Heterogeneous hypodensity in the deep gray matter nuclei compatible with chronic lacunar infarcts appear stable. No acute intracranial hemorrhage identified. No cortically based acute infarct identified. No suspicious intracranial vascular hyperdensity. CT CERVICAL SPINE FINDINGS Osteopenia. Chronic multilevel mild  spondylolisthesis in the cervical spine most pronounced at C3-C4 (retrolisthesis). Widespread disc space loss. Visualized skull base is intact. No atlanto-occipital dissociation. Stable cervicothoracic junction alignment. Stable bilateral posterior element alignment. No cervical spine fracture identified. Negative lung apices. Grossly intact visualized upper thoracic levels. Chronic multifactorial degenerative spinal stenosis at C3-C4. Thyromegaly on the right, no discrete thyroid nodule. Mild for age calcified atherosclerosis. Otherwise negative noncontrast paraspinal soft tissues. IMPRESSION: 1. Forehead soft tissue injury without underlying fracture. 2. No acute intracranial abnormality. Chronic small vessel disease. 3. No acute fracture or listhesis identified in the cervical spine. Ligamentous injury is not excluded. 4. Chronic cervical spine spondylolisthesis with degenerative C3-C4 spinal stenosis. Electronically Signed   By: Genevie Ann M.D.   On: 04/26/2015 19:34   Ct Knee Left Wo Contrast  04/29/2015  CLINICAL DATA:  Status post fall 04/26/2015 with  left knee pain. Subsequent encounter. EXAM: CT OF THE LEFT KNEE WITHOUT CONTRAST TECHNIQUE: Multidetector CT imaging of the left knee was performed according to the standard protocol. Multiplanar CT image reconstructions were also generated. COMPARISON:  None films left knee 04/26/2015 and 05/18/2014. FINDINGS: Bones are osteopenic. No fracture is identified. Osteophytosis is seen about the knee consistent with mild to moderate degenerative disease. A hematoma measuring approximately 3.3 cm AP x 8.1 cm transverse x 12.5 cm craniocaudal is seen over the anterior and lateral aspect of the knee in the subcutaneous tissues. There is only a small joint effusion. No lipohemarthrosis is identified. Cruciate and collateral ligament complexes appear intact. IMPRESSION: Large hematoma anterior and lateral to the left knee. Negative for fracture. Mild to moderate tricompartmental osteoarthritis. Osteopenia. Small, simple appearing joint effusion. Electronically Signed   By: Inge Rise M.D.   On: 04/29/2015 12:28   Dg Knee Complete 4 Views Left  04/26/2015  CLINICAL DATA:  81 year old male who fell at home. Pain. Initial encounter. EXAM: LEFT KNEE - COMPLETE 4+ VIEW COMPARISON:  05/18/2014. FINDINGS: Advanced tricompartmental degenerative changes in the left knee maximal in the lateral compartment. 2 cm soft tissue swelling anterior to the patella and joint space compatible with superficial hematoma. The patella appears stable and intact. Osteopenia. No acute fracture identified. Calcified peripheral vascular disease. IMPRESSION: Soft tissue injury. Advanced degenerative changes in the left knee with no acute fracture or dislocation identified. Electronically Signed   By: Genevie Ann M.D.   On: 04/26/2015 20:07   Dg Humerus Right  04/26/2015  CLINICAL DATA:  81 year old male with acute right arm pain following fall today. Initial encounter. EXAM: RIGHT HUMERUS - 2+ VIEW COMPARISON:  09/20/2012 radiographs FINDINGS:  Right shoulder arthroplasty again noted without evidence of subluxation or dislocation. No acute fracture or complicating hardware features noted. Soft tissue swelling is present. No focal bony lesions are identified. IMPRESSION: Soft tissue swelling without acute bony abnormality. Right shoulder arthroplasty without complicating features. Electronically Signed   By: Margarette Canada M.D.   On: 04/26/2015 20:04   Dg Hip Unilat With Pelvis 2-3 Views Right  04/26/2015  CLINICAL DATA:  81 year old male with acute left hip pain following fall today. Initial encounter. EXAM: DG HIP (WITH OR WITHOUT PELVIS) 2-3V RIGHT COMPARISON:  09/11/2012 radiographs FINDINGS: There is no evidence of hip fracture or dislocation. There is no evidence of arthropathy or other focal bone abnormality. IMPRESSION: Negative. Electronically Signed   By: Margarette Canada M.D.   On: 04/26/2015 20:07    ASSESSMENT/PLAN:  Near syncope - for rehabilitation  Hypertension - well controlled; continue lisinopril 20 mg 1  tab by mouth daily and metoprolol tartrate 25 mg 1 tab by mouth twice a day  Chronic atrial fibrillation - rate controlled; continue Eliquis 5 mg 1 tab by mouth twice a day  Diabetes mellitus, type II - hemoglobin A1c 7.7; continue Glucophage 500 mg 1 tab by mouth daily  Hyperlipidemia - continue Zocor 10 mg 1 tab by mouth daily at bedtime      Goals of care:  Short-term rehabilitation   [facility], [place] [phone]

## 2015-05-07 DIAGNOSIS — R2681 Unsteadiness on feet: Secondary | ICD-10-CM | POA: Diagnosis not present

## 2015-05-07 DIAGNOSIS — I10 Essential (primary) hypertension: Secondary | ICD-10-CM | POA: Diagnosis not present

## 2015-05-07 DIAGNOSIS — M25562 Pain in left knee: Secondary | ICD-10-CM | POA: Diagnosis not present

## 2015-05-07 DIAGNOSIS — M6281 Muscle weakness (generalized): Secondary | ICD-10-CM | POA: Diagnosis not present

## 2015-05-07 DIAGNOSIS — M79605 Pain in left leg: Secondary | ICD-10-CM | POA: Diagnosis not present

## 2015-05-07 DIAGNOSIS — Z9181 History of falling: Secondary | ICD-10-CM | POA: Diagnosis not present

## 2015-05-08 ENCOUNTER — Ambulatory Visit: Payer: Medicare Other | Admitting: Nurse Practitioner

## 2015-05-08 DIAGNOSIS — I10 Essential (primary) hypertension: Secondary | ICD-10-CM | POA: Diagnosis not present

## 2015-05-08 DIAGNOSIS — Z9181 History of falling: Secondary | ICD-10-CM | POA: Diagnosis not present

## 2015-05-08 DIAGNOSIS — M25562 Pain in left knee: Secondary | ICD-10-CM | POA: Diagnosis not present

## 2015-05-08 DIAGNOSIS — R2681 Unsteadiness on feet: Secondary | ICD-10-CM | POA: Diagnosis not present

## 2015-05-08 DIAGNOSIS — M6281 Muscle weakness (generalized): Secondary | ICD-10-CM | POA: Diagnosis not present

## 2015-05-08 DIAGNOSIS — M79605 Pain in left leg: Secondary | ICD-10-CM | POA: Diagnosis not present

## 2015-05-09 DIAGNOSIS — I10 Essential (primary) hypertension: Secondary | ICD-10-CM | POA: Diagnosis not present

## 2015-05-09 DIAGNOSIS — R2681 Unsteadiness on feet: Secondary | ICD-10-CM | POA: Diagnosis not present

## 2015-05-09 DIAGNOSIS — M79605 Pain in left leg: Secondary | ICD-10-CM | POA: Diagnosis not present

## 2015-05-09 DIAGNOSIS — M6281 Muscle weakness (generalized): Secondary | ICD-10-CM | POA: Diagnosis not present

## 2015-05-09 DIAGNOSIS — M25562 Pain in left knee: Secondary | ICD-10-CM | POA: Diagnosis not present

## 2015-05-09 DIAGNOSIS — Z9181 History of falling: Secondary | ICD-10-CM | POA: Diagnosis not present

## 2015-05-10 DIAGNOSIS — M79605 Pain in left leg: Secondary | ICD-10-CM | POA: Diagnosis not present

## 2015-05-10 DIAGNOSIS — S8002XA Contusion of left knee, initial encounter: Secondary | ICD-10-CM | POA: Diagnosis not present

## 2015-05-10 DIAGNOSIS — I10 Essential (primary) hypertension: Secondary | ICD-10-CM | POA: Diagnosis not present

## 2015-05-10 DIAGNOSIS — M25562 Pain in left knee: Secondary | ICD-10-CM | POA: Diagnosis not present

## 2015-05-10 DIAGNOSIS — R2681 Unsteadiness on feet: Secondary | ICD-10-CM | POA: Diagnosis not present

## 2015-05-10 DIAGNOSIS — M170 Bilateral primary osteoarthritis of knee: Secondary | ICD-10-CM | POA: Diagnosis not present

## 2015-05-10 DIAGNOSIS — Z9181 History of falling: Secondary | ICD-10-CM | POA: Diagnosis not present

## 2015-05-10 DIAGNOSIS — M6281 Muscle weakness (generalized): Secondary | ICD-10-CM | POA: Diagnosis not present

## 2015-05-11 DIAGNOSIS — M79605 Pain in left leg: Secondary | ICD-10-CM | POA: Diagnosis not present

## 2015-05-11 DIAGNOSIS — D509 Iron deficiency anemia, unspecified: Secondary | ICD-10-CM | POA: Diagnosis not present

## 2015-05-11 DIAGNOSIS — I10 Essential (primary) hypertension: Secondary | ICD-10-CM | POA: Diagnosis not present

## 2015-05-11 DIAGNOSIS — Z9181 History of falling: Secondary | ICD-10-CM | POA: Diagnosis not present

## 2015-05-11 DIAGNOSIS — R2681 Unsteadiness on feet: Secondary | ICD-10-CM | POA: Diagnosis not present

## 2015-05-11 DIAGNOSIS — M6281 Muscle weakness (generalized): Secondary | ICD-10-CM | POA: Diagnosis not present

## 2015-05-11 DIAGNOSIS — M25562 Pain in left knee: Secondary | ICD-10-CM | POA: Diagnosis not present

## 2015-05-14 DIAGNOSIS — R2681 Unsteadiness on feet: Secondary | ICD-10-CM | POA: Diagnosis not present

## 2015-05-14 DIAGNOSIS — Z9181 History of falling: Secondary | ICD-10-CM | POA: Diagnosis not present

## 2015-05-14 DIAGNOSIS — M79605 Pain in left leg: Secondary | ICD-10-CM | POA: Diagnosis not present

## 2015-05-14 DIAGNOSIS — M25562 Pain in left knee: Secondary | ICD-10-CM | POA: Diagnosis not present

## 2015-05-14 DIAGNOSIS — D509 Iron deficiency anemia, unspecified: Secondary | ICD-10-CM | POA: Diagnosis not present

## 2015-05-14 DIAGNOSIS — I10 Essential (primary) hypertension: Secondary | ICD-10-CM | POA: Diagnosis not present

## 2015-05-14 DIAGNOSIS — M6281 Muscle weakness (generalized): Secondary | ICD-10-CM | POA: Diagnosis not present

## 2015-05-14 DIAGNOSIS — R079 Chest pain, unspecified: Secondary | ICD-10-CM | POA: Diagnosis not present

## 2015-05-15 DIAGNOSIS — M25562 Pain in left knee: Secondary | ICD-10-CM | POA: Diagnosis not present

## 2015-05-15 DIAGNOSIS — R2681 Unsteadiness on feet: Secondary | ICD-10-CM | POA: Diagnosis not present

## 2015-05-15 DIAGNOSIS — I10 Essential (primary) hypertension: Secondary | ICD-10-CM | POA: Diagnosis not present

## 2015-05-15 DIAGNOSIS — Z9181 History of falling: Secondary | ICD-10-CM | POA: Diagnosis not present

## 2015-05-15 DIAGNOSIS — R55 Syncope and collapse: Secondary | ICD-10-CM | POA: Diagnosis not present

## 2015-05-15 DIAGNOSIS — M6281 Muscle weakness (generalized): Secondary | ICD-10-CM | POA: Diagnosis not present

## 2015-05-15 DIAGNOSIS — M79605 Pain in left leg: Secondary | ICD-10-CM | POA: Diagnosis not present

## 2015-05-16 ENCOUNTER — Ambulatory Visit: Payer: Medicare Other | Admitting: Podiatry

## 2015-05-16 DIAGNOSIS — R55 Syncope and collapse: Secondary | ICD-10-CM | POA: Diagnosis not present

## 2015-05-16 DIAGNOSIS — M6281 Muscle weakness (generalized): Secondary | ICD-10-CM | POA: Diagnosis not present

## 2015-05-16 DIAGNOSIS — M25562 Pain in left knee: Secondary | ICD-10-CM | POA: Diagnosis not present

## 2015-05-16 DIAGNOSIS — R2681 Unsteadiness on feet: Secondary | ICD-10-CM | POA: Diagnosis not present

## 2015-05-16 DIAGNOSIS — I10 Essential (primary) hypertension: Secondary | ICD-10-CM | POA: Diagnosis not present

## 2015-05-16 DIAGNOSIS — Z9181 History of falling: Secondary | ICD-10-CM | POA: Diagnosis not present

## 2015-05-17 DIAGNOSIS — R2681 Unsteadiness on feet: Secondary | ICD-10-CM | POA: Diagnosis not present

## 2015-05-17 DIAGNOSIS — Z9181 History of falling: Secondary | ICD-10-CM | POA: Diagnosis not present

## 2015-05-17 DIAGNOSIS — M25562 Pain in left knee: Secondary | ICD-10-CM | POA: Diagnosis not present

## 2015-05-17 DIAGNOSIS — I10 Essential (primary) hypertension: Secondary | ICD-10-CM | POA: Diagnosis not present

## 2015-05-17 DIAGNOSIS — R55 Syncope and collapse: Secondary | ICD-10-CM | POA: Diagnosis not present

## 2015-05-17 DIAGNOSIS — M6281 Muscle weakness (generalized): Secondary | ICD-10-CM | POA: Diagnosis not present

## 2015-05-18 DIAGNOSIS — R55 Syncope and collapse: Secondary | ICD-10-CM | POA: Diagnosis not present

## 2015-05-18 DIAGNOSIS — Z9181 History of falling: Secondary | ICD-10-CM | POA: Diagnosis not present

## 2015-05-18 DIAGNOSIS — M25562 Pain in left knee: Secondary | ICD-10-CM | POA: Diagnosis not present

## 2015-05-18 DIAGNOSIS — I10 Essential (primary) hypertension: Secondary | ICD-10-CM | POA: Diagnosis not present

## 2015-05-18 DIAGNOSIS — M6281 Muscle weakness (generalized): Secondary | ICD-10-CM | POA: Diagnosis not present

## 2015-05-18 DIAGNOSIS — R2681 Unsteadiness on feet: Secondary | ICD-10-CM | POA: Diagnosis not present

## 2015-05-21 DIAGNOSIS — M25562 Pain in left knee: Secondary | ICD-10-CM | POA: Diagnosis not present

## 2015-05-21 DIAGNOSIS — Z9181 History of falling: Secondary | ICD-10-CM | POA: Diagnosis not present

## 2015-05-21 DIAGNOSIS — M6281 Muscle weakness (generalized): Secondary | ICD-10-CM | POA: Diagnosis not present

## 2015-05-21 DIAGNOSIS — R55 Syncope and collapse: Secondary | ICD-10-CM | POA: Diagnosis not present

## 2015-05-21 DIAGNOSIS — R2681 Unsteadiness on feet: Secondary | ICD-10-CM | POA: Diagnosis not present

## 2015-05-21 DIAGNOSIS — I10 Essential (primary) hypertension: Secondary | ICD-10-CM | POA: Diagnosis not present

## 2015-05-22 ENCOUNTER — Non-Acute Institutional Stay (SKILLED_NURSING_FACILITY): Payer: Medicare Other | Admitting: Adult Health

## 2015-05-22 ENCOUNTER — Encounter: Payer: Self-pay | Admitting: Adult Health

## 2015-05-22 DIAGNOSIS — I10 Essential (primary) hypertension: Secondary | ICD-10-CM

## 2015-05-22 DIAGNOSIS — M79605 Pain in left leg: Secondary | ICD-10-CM

## 2015-05-22 DIAGNOSIS — E785 Hyperlipidemia, unspecified: Secondary | ICD-10-CM | POA: Diagnosis not present

## 2015-05-22 DIAGNOSIS — I482 Chronic atrial fibrillation, unspecified: Secondary | ICD-10-CM

## 2015-05-22 DIAGNOSIS — R5381 Other malaise: Secondary | ICD-10-CM

## 2015-05-22 DIAGNOSIS — N183 Chronic kidney disease, stage 3 (moderate): Secondary | ICD-10-CM

## 2015-05-22 DIAGNOSIS — E1122 Type 2 diabetes mellitus with diabetic chronic kidney disease: Secondary | ICD-10-CM | POA: Diagnosis not present

## 2015-05-22 NOTE — Progress Notes (Signed)
Patient ID: Logan Bean, male   DOB: 07-Mar-1921, 81 y.o.   MRN: [id number]    DATE:  05/22/15  MRN:  [id number]  BIRTHDAY: 1920/08/24  Facility:  [facility] Home Location:  [place] Room Number: 721-C  LEVEL OF CARE:  SNF (31)  Contact Information    Name Relation Home Work [place] [phone]  [phone]   Charlyne Petrin   [phone]       Chief Complaint  Patient presents with  . Discharge Note    Physical deconditioning, Hypertension, chronic atrial fibrillation, diabetes mellitus, left leg pain and hyperlipidemia    HISTORY OF PRESENT ILLNESS:  This is a 81 year old male who is for discharge home with Home health PT for endurance, OT for ADLs and CNA for showers. He has been admitted to [facility] on 05/01/15 from [facility]. He has PMH of atrial fibrillation on Apixaban, hypertension and diabetes mellitus. He had a fall at home. Family found him on the floor and brought to the ER. Multiple x-rays obtained in no acute fractures reported. CT of the knee obtained showed hematoma.  Patient was admitted to this facility for short-term rehabilitation after the patient's recent hospitalization.  Patient has completed SNF rehabilitation and therapy has cleared the patient for discharge.  PAST MEDICAL HISTORY:  Past Medical History  Diagnosis Date  . Hypertension   . Hypercholesteremia   . Stomach ulcer     years  . H/O hiatal hernia   . Varicose veins     both legs  . Arthritis     left knee  . Diabetes mellitus     diet controlled, pt does not check cbg  . Left rotator cuff tear Jul 12, 2012  . Edema   . Unspecified hereditary and idiopathic peripheral neuropathy   . Dizziness and giddiness   . Unspecified vitamin D deficiency   . Diaphragmatic hernia without mention of obstruction or gangrene      CURRENT MEDICATIONS: Reviewed  Patient's Medications  New Prescriptions   No medications on  file  Previous Medications   ACETAMINOPHEN (TYLENOL) 325 MG TABLET    Take 2 tablets (650 mg total) by mouth every 6 (six) hours as needed for mild pain (or Fever >/= 101).   APIXABAN (ELIQUIS) 5 MG TABS TABLET    Take 5 mg by mouth 2 (two) times daily.   CALCIUM CARBONATE-VITAMIN D 600-200 MG-UNIT TABS    Take 1 tablet by mouth 2 (two) times daily. Take 1 tablet daily for vitamin supplement.   GLUCOSAMINE-CHONDROIT-VIT C-MN (GLUCOSAMINE 1500 COMPLEX) CAPS    Take one tablet once a day   GLUCOSE BLOOD TEST STRIP    True Test Glucose Test Strips Check blood sugar once daily Dx: E11.9   GLUCOSE MONITORING KIT (FREESTYLE) MONITORING KIT    1 each by Does not apply route as needed for other. Take blood sugar once daily.   LISINOPRIL (PRINIVIL,ZESTRIL) 20 MG TABLET    Take 20 mg by mouth daily.   METFORMIN (GLUCOPHAGE) 500 MG TABLET    Take 1 tablet (500 mg total) by mouth daily with breakfast.   METOPROLOL TARTRATE (LOPRESSOR) 25 MG TABLET    TAKE 1 TABLET TWICE DAILY.   MULTIPLE VITAMIN (MULITIVITAMIN WITH MINERALS) TABS    Take 1 tablet by mouth daily.    OMEGA-3 FATTY ACIDS (FISH OIL) 1000 MG CAPS    Take 1,000 mg by mouth daily.  SIMVASTATIN (ZOCOR) 10 MG TABLET    TAKE ONE TABLET AT BEDTIME.   TRAMADOL (ULTRAM) 50 MG TABLET    Take 1 tablet (50 mg total) by mouth every 6 (six) hours as needed for severe pain.   TRUEPLUS LANCETS 30G MISC    Use to check blood sugar once daily. Dx E11.9  Modified Medications   No medications on file  Discontinued Medications   No medications on file     Allergies  Allergen Reactions  . Morphine And Related Nausea And Vomiting  . Oxycodone Nausea And Vomiting     REVIEW OF SYSTEMS:  GENERAL: no change in appetite, no fatigue, no weight changes, no fever, chills or weakness EYES: Denies change in vision, dry eyes, eye pain, itching or discharge EARS: Denies change in hearing, ringing in ears, or earache NOSE: Denies nasal congestion or  epistaxis MOUTH and THROAT: Denies oral discomfort, gingival pain or bleeding, pain from teeth or hoarseness   RESPIRATORY: no cough, SOB, DOE, wheezing, hemoptysis CARDIAC: no chest pain, or palpitations GI: no abdominal pain, diarrhea, constipation, heart burn, nausea or vomiting GU: Denies dysuria, frequency, hematuria, incontinence, or discharge PSYCHIATRIC: Denies feeling of depression or anxiety. No report of hallucinations, insomnia, paranoia, or agitation   PHYSICAL EXAMINATION  GENERAL APPEARANCE: Well nourished. In no acute distress. Normal body habitus HEAD: Normal in size and contour. No evidence of trauma EYES: Lids open and close normally. No blepharitis, entropion or ectropion. PERRL. Conjunctivae are clear and sclerae are white. Lenses are without opacity EARS: Pinnae are normal. Patient hears normal voice tunes of the examiner MOUTH and THROAT: Lips are without lesions. Oral mucosa is moist and without lesions. Tongue is normal in shape, size, and color and without lesions NECK: supple, trachea midline, no neck masses, no thyroid tenderness, no thyromegaly LYMPHATICS: no LAN in the neck, no supraclavicular LAN RESPIRATORY: breathing is even & unlabored, BS CTAB CARDIAC: RRR, no murmur,no extra heart sounds, GI: abdomen soft, normal BS, no masses, no tenderness, no hepatomegaly, no splenomegaly EXTREMITIES:  Able to move 4 extremities; LLLE edema 2+, tender PSYCHIATRIC: Alert and oriented X 3. Affect and behavior are appropriate  LABS/RADIOLOGY: Labs reviewed: 05/14/15  chest x-ray shows no acute cardiopulmonary process 05/12/15  WBC 5.2 hemoglobin 10.4 hematocrit 33.8 MCV 86.9 platelet 296 05/11/15  sodium 135 potassium 5.0 glucose 187 BUN 32 creatinine 0.98 calcium 9.4 05/07/15  hemoglobin 10.4 WBC 9.4 hematocrit 33.8 MCV 88.7 platelet 220 sodium 138 potassium 4.6 glucose 152 BUN 35 creatinine 1.14 calcium 9.6 Basic Metabolic Panel:  Recent Labs  04/19/15 1047  04/26/15 2020 04/27/15 0232  NA 142 133* 129*  K 4.6 4.8 4.6  CL 103 98* 94*  CO2 _0 GLUCOSE 146* 259* 302*  BUN 26 41* 38*  CREATININE 1.05 1.13 1.17  CALCIUM 9.7 9.9 9.1   Liver Function Tests:  Recent Labs  12/08/14 1102  AST 20  ALT 22  ALKPHOS 94  BILITOT 0.7  PROT 6.2  ALBUMIN 4.1   CBC:  Recent Labs  12/08/14 1102 04/19/15 1047  04/26/15 2020 04/27/15 0232 04/28/15 0530 04/29/15 0612  WBC 11.5* 6.2  < > 15.0* 13.9* 9.1 7.8  NEUTROABS 10.0* 3.8  --  13.5*  --   --   --   HGB  --   --   --  13.9 12.3* 12.0* 11.5*  HCT 39.1 43.2  --  42.4 37.7* 37.3* 36.9*  MCV  --   --   --  83.5 84.0 84.2 84.6  PLT  --   --   --  191 190 161 149*  < > = values in this interval not displayed. Lipid Panel:  Recent Labs  12/08/14 1102  HDL 53   Cardiac Enzymes:  Recent Labs  04/26/15 2020 04/27/15 0232  CKTOTAL  --  90  TROPONINI <0.03  --    CBG:  Recent Labs  04/30/15 2155 05/01/15 0753 05/01/15 1148  GLUCAP 163* 156* 179*      Dg Chest 2 View  04/26/2015  CLINICAL DATA:  Status post fall. EXAM: CHEST  2 VIEW COMPARISON:  May 19, 2014 FINDINGS: The heart size and mediastinal contours are stable. The heart size is enlarged. There is no focal infiltrate, pulmonary edema, or pleural effusion. There is a question 1.7 cm nodule in the medial right upper lobe. The visualized skeletal structures are stable. IMPRESSION: No active cardiopulmonary disease. Question 1.7 nodule in the medial right upper lobe. Further evaluation with chest CT on outpatient basis is recommended. Electronically Signed   By: Abelardo Diesel M.D.   On: 04/26/2015 20:03   Ct Head Wo Contrast  04/26/2015  CLINICAL DATA:  81 year old male who fell. Lacerations, hematoma, teeth knocked out, neck pain. Initial encounter. EXAM: CT HEAD WITHOUT CONTRAST CT CERVICAL SPINE WITHOUT CONTRAST TECHNIQUE: Multidetector CT imaging of the head and cervical spine was performed following the  standard protocol without intravenous contrast. Multiplanar CT image reconstructions of the cervical spine were also generated. COMPARISON:  05/18/2014. FINDINGS: CT HEAD FINDINGS Visualized paranasal sinuses and mastoids are clear. Right forehead scalp hematoma measuring up to 12 mm in thickness. Underlying right frontal bone intact. Hyperostosis frontalis No acute osseous abnormality identified. Calcified atherosclerosis at the skull base. Cerebral volume has mildly diminished since 2015. No ventriculomegaly. Asymmetric CSF along the left frontal convexity is incidentally noted and stable, perhaps a small hygroma but appears inconsequential. No midline shift or intracranial mass effect. Heterogeneous hypodensity in the deep gray matter nuclei compatible with chronic lacunar infarcts appear stable. No acute intracranial hemorrhage identified. No cortically based acute infarct identified. No suspicious intracranial vascular hyperdensity. CT CERVICAL SPINE FINDINGS Osteopenia. Chronic multilevel mild spondylolisthesis in the cervical spine most pronounced at C3-C4 (retrolisthesis). Widespread disc space loss. Visualized skull base is intact. No atlanto-occipital dissociation. Stable cervicothoracic junction alignment. Stable bilateral posterior element alignment. No cervical spine fracture identified. Negative lung apices. Grossly intact visualized upper thoracic levels. Chronic multifactorial degenerative spinal stenosis at C3-C4. Thyromegaly on the right, no discrete thyroid nodule. Mild for age calcified atherosclerosis. Otherwise negative noncontrast paraspinal soft tissues. IMPRESSION: 1. Forehead soft tissue injury without underlying fracture. 2. No acute intracranial abnormality. Chronic small vessel disease. 3. No acute fracture or listhesis identified in the cervical spine. Ligamentous injury is not excluded. 4. Chronic cervical spine spondylolisthesis with degenerative C3-C4 spinal stenosis. Electronically  Signed   By: Genevie Ann M.D.   On: 04/26/2015 19:34   Ct Cervical Spine Wo Contrast  04/26/2015  CLINICAL DATA:  81 year old male who fell. Lacerations, hematoma, teeth knocked out, neck pain. Initial encounter. EXAM: CT HEAD WITHOUT CONTRAST CT CERVICAL SPINE WITHOUT CONTRAST TECHNIQUE: Multidetector CT imaging of the head and cervical spine was performed following the standard protocol without intravenous contrast. Multiplanar CT image reconstructions of the cervical spine were also generated. COMPARISON:  05/18/2014. FINDINGS: CT HEAD FINDINGS Visualized paranasal sinuses and mastoids are clear. Right forehead scalp hematoma measuring up to 12 mm in thickness. Underlying right frontal bone intact.  Hyperostosis frontalis No acute osseous abnormality identified. Calcified atherosclerosis at the skull base. Cerebral volume has mildly diminished since 2015. No ventriculomegaly. Asymmetric CSF along the left frontal convexity is incidentally noted and stable, perhaps a small hygroma but appears inconsequential. No midline shift or intracranial mass effect. Heterogeneous hypodensity in the deep gray matter nuclei compatible with chronic lacunar infarcts appear stable. No acute intracranial hemorrhage identified. No cortically based acute infarct identified. No suspicious intracranial vascular hyperdensity. CT CERVICAL SPINE FINDINGS Osteopenia. Chronic multilevel mild spondylolisthesis in the cervical spine most pronounced at C3-C4 (retrolisthesis). Widespread disc space loss. Visualized skull base is intact. No atlanto-occipital dissociation. Stable cervicothoracic junction alignment. Stable bilateral posterior element alignment. No cervical spine fracture identified. Negative lung apices. Grossly intact visualized upper thoracic levels. Chronic multifactorial degenerative spinal stenosis at C3-C4. Thyromegaly on the right, no discrete thyroid nodule. Mild for age calcified atherosclerosis. Otherwise negative  noncontrast paraspinal soft tissues. IMPRESSION: 1. Forehead soft tissue injury without underlying fracture. 2. No acute intracranial abnormality. Chronic small vessel disease. 3. No acute fracture or listhesis identified in the cervical spine. Ligamentous injury is not excluded. 4. Chronic cervical spine spondylolisthesis with degenerative C3-C4 spinal stenosis. Electronically Signed   By: Genevie Ann M.D.   On: 04/26/2015 19:34   Ct Knee Left Wo Contrast  04/29/2015  CLINICAL DATA:  Status post fall 04/26/2015 with left knee pain. Subsequent encounter. EXAM: CT OF THE LEFT KNEE WITHOUT CONTRAST TECHNIQUE: Multidetector CT imaging of the left knee was performed according to the standard protocol. Multiplanar CT image reconstructions were also generated. COMPARISON:  None films left knee 04/26/2015 and 05/18/2014. FINDINGS: Bones are osteopenic. No fracture is identified. Osteophytosis is seen about the knee consistent with mild to moderate degenerative disease. A hematoma measuring approximately 3.3 cm AP x 8.1 cm transverse x 12.5 cm craniocaudal is seen over the anterior and lateral aspect of the knee in the subcutaneous tissues. There is only a small joint effusion. No lipohemarthrosis is identified. Cruciate and collateral ligament complexes appear intact. IMPRESSION: Large hematoma anterior and lateral to the left knee. Negative for fracture. Mild to moderate tricompartmental osteoarthritis. Osteopenia. Small, simple appearing joint effusion. Electronically Signed   By: Inge Rise M.D.   On: 04/29/2015 12:28   Dg Knee Complete 4 Views Left  04/26/2015  CLINICAL DATA:  81 year old male who fell at home. Pain. Initial encounter. EXAM: LEFT KNEE - COMPLETE 4+ VIEW COMPARISON:  05/18/2014. FINDINGS: Advanced tricompartmental degenerative changes in the left knee maximal in the lateral compartment. 2 cm soft tissue swelling anterior to the patella and joint space compatible with superficial hematoma. The  patella appears stable and intact. Osteopenia. No acute fracture identified. Calcified peripheral vascular disease. IMPRESSION: Soft tissue injury. Advanced degenerative changes in the left knee with no acute fracture or dislocation identified. Electronically Signed   By: Genevie Ann M.D.   On: 04/26/2015 20:07   Dg Humerus Right  04/26/2015  CLINICAL DATA:  81 year old male with acute right arm pain following fall today. Initial encounter. EXAM: RIGHT HUMERUS - 2+ VIEW COMPARISON:  09/20/2012 radiographs FINDINGS: Right shoulder arthroplasty again noted without evidence of subluxation or dislocation. No acute fracture or complicating hardware features noted. Soft tissue swelling is present. No focal bony lesions are identified. IMPRESSION: Soft tissue swelling without acute bony abnormality. Right shoulder arthroplasty without complicating features. Electronically Signed   By: Margarette Canada M.D.   On: 04/26/2015 20:04   Dg Hip Unilat With Pelvis 2-3 Views Right  04/26/2015  CLINICAL DATA:  81 year old male with acute left hip pain following fall today. Initial encounter. EXAM: DG HIP (WITH OR WITHOUT PELVIS) 2-3V RIGHT COMPARISON:  09/11/2012 radiographs FINDINGS: There is no evidence of hip fracture or dislocation. There is no evidence of arthropathy or other focal bone abnormality. IMPRESSION: Negative. Electronically Signed   By: Margarette Canada M.D.   On: 04/26/2015 20:07    ASSESSMENT/PLAN:  Physical deconditioning - for home health PT, OT and CNA   Hypertension - well controlled; continue lisinopril 20 mg 1 tab by mouth daily and metoprolol tartrate 25 mg 1 tab by mouth twice a day  Chronic atrial fibrillation - rate controlled; continue Eliquis 5 mg 1 tab by mouth twice a day  Diabetes mellitus, type II - hemoglobin A1c 7.7; continue Glucophage 500 mg 1 tab by mouth daily  Hyperlipidemia - continue Zocor 10 mg 1 tab by mouth daily at bedtime  Pain on LLE -  refuses Doppler ultrasound to be done;  continue tramadol when necessary      I have filled out patient's discharge paperwork and written prescriptions.  Patient will receive home health PT, OT and CNA.  Total discharge time: Less than 30 minutes  Discharge time involved coordination of the discharge process with Education officer, museum, nursing staff and therapy department. Medical justification for home health services verified.    [facility], NP [employer] [phone]

## 2015-05-23 DIAGNOSIS — I4891 Unspecified atrial fibrillation: Secondary | ICD-10-CM | POA: Diagnosis not present

## 2015-05-23 DIAGNOSIS — M8580 Other specified disorders of bone density and structure, unspecified site: Secondary | ICD-10-CM | POA: Diagnosis not present

## 2015-05-23 DIAGNOSIS — I129 Hypertensive chronic kidney disease with stage 1 through stage 4 chronic kidney disease, or unspecified chronic kidney disease: Secondary | ICD-10-CM | POA: Diagnosis not present

## 2015-05-23 DIAGNOSIS — Z9181 History of falling: Secondary | ICD-10-CM | POA: Diagnosis not present

## 2015-05-23 DIAGNOSIS — M4802 Spinal stenosis, cervical region: Secondary | ICD-10-CM | POA: Diagnosis not present

## 2015-05-23 DIAGNOSIS — M4312 Spondylolisthesis, cervical region: Secondary | ICD-10-CM | POA: Diagnosis not present

## 2015-05-23 DIAGNOSIS — N183 Chronic kidney disease, stage 3 (moderate): Secondary | ICD-10-CM | POA: Diagnosis not present

## 2015-05-23 DIAGNOSIS — M170 Bilateral primary osteoarthritis of knee: Secondary | ICD-10-CM | POA: Diagnosis not present

## 2015-05-23 DIAGNOSIS — Z7984 Long term (current) use of oral hypoglycemic drugs: Secondary | ICD-10-CM | POA: Diagnosis not present

## 2015-05-23 DIAGNOSIS — Z7901 Long term (current) use of anticoagulants: Secondary | ICD-10-CM | POA: Diagnosis not present

## 2015-05-23 DIAGNOSIS — E1122 Type 2 diabetes mellitus with diabetic chronic kidney disease: Secondary | ICD-10-CM | POA: Diagnosis not present

## 2015-05-24 DIAGNOSIS — I129 Hypertensive chronic kidney disease with stage 1 through stage 4 chronic kidney disease, or unspecified chronic kidney disease: Secondary | ICD-10-CM | POA: Diagnosis not present

## 2015-05-24 DIAGNOSIS — M4312 Spondylolisthesis, cervical region: Secondary | ICD-10-CM | POA: Diagnosis not present

## 2015-05-24 DIAGNOSIS — M170 Bilateral primary osteoarthritis of knee: Secondary | ICD-10-CM | POA: Diagnosis not present

## 2015-05-24 DIAGNOSIS — I4891 Unspecified atrial fibrillation: Secondary | ICD-10-CM | POA: Diagnosis not present

## 2015-05-24 DIAGNOSIS — E1122 Type 2 diabetes mellitus with diabetic chronic kidney disease: Secondary | ICD-10-CM | POA: Diagnosis not present

## 2015-05-24 DIAGNOSIS — N183 Chronic kidney disease, stage 3 (moderate): Secondary | ICD-10-CM | POA: Diagnosis not present

## 2015-05-24 DIAGNOSIS — S8002XD Contusion of left knee, subsequent encounter: Secondary | ICD-10-CM | POA: Diagnosis not present

## 2015-05-25 ENCOUNTER — Telehealth: Payer: Self-pay | Admitting: *Deleted

## 2015-05-25 NOTE — Telephone Encounter (Signed)
Maxwell Caul with [employer] called wanting verbal orders for PT for patient. Verbal given and will fax.

## 2015-05-28 ENCOUNTER — Telehealth: Payer: Self-pay | Admitting: *Deleted

## 2015-05-28 NOTE — Telephone Encounter (Signed)
Renee with [employer] called wanting verbal order for patient to receive Skilled Nursing. Verbal order given and she will also fax orders to be signed.

## 2015-05-29 ENCOUNTER — Encounter: Payer: Self-pay | Admitting: Nurse Practitioner

## 2015-05-29 ENCOUNTER — Ambulatory Visit (INDEPENDENT_AMBULATORY_CARE_PROVIDER_SITE_OTHER): Payer: Medicare Other | Admitting: Nurse Practitioner

## 2015-05-29 VITALS — BP 120/76 | HR 90 | Temp 97.9°F | Resp 20 | Ht 65.0 in | Wt 148.0 lb

## 2015-05-29 DIAGNOSIS — E871 Hypo-osmolality and hyponatremia: Secondary | ICD-10-CM

## 2015-05-29 DIAGNOSIS — E1122 Type 2 diabetes mellitus with diabetic chronic kidney disease: Secondary | ICD-10-CM | POA: Diagnosis not present

## 2015-05-29 DIAGNOSIS — I482 Chronic atrial fibrillation, unspecified: Secondary | ICD-10-CM

## 2015-05-29 DIAGNOSIS — R2681 Unsteadiness on feet: Secondary | ICD-10-CM

## 2015-05-29 DIAGNOSIS — M25562 Pain in left knee: Secondary | ICD-10-CM | POA: Diagnosis not present

## 2015-05-29 DIAGNOSIS — N183 Chronic kidney disease, stage 3 (moderate): Secondary | ICD-10-CM

## 2015-05-29 DIAGNOSIS — I10 Essential (primary) hypertension: Secondary | ICD-10-CM | POA: Diagnosis not present

## 2015-05-29 DIAGNOSIS — W19XXXD Unspecified fall, subsequent encounter: Secondary | ICD-10-CM

## 2015-05-29 MED ORDER — TRAMADOL HCL 50 MG PO TABS: 50.0000 mg | ORAL_TABLET | Freq: Four times a day (QID) | ORAL | Status: DC | PRN

## 2015-05-29 NOTE — Patient Instructions (Signed)
Follow up in January with lab work prior to visit

## 2015-05-29 NOTE — Progress Notes (Signed)
Patient ID: Logan Bean, male   DOB: 21-May-1921, 81 y.o.   MRN: [id number]    PCP: Sharon Seller, NP  Advanced Directive information Does patient have an advance directive?: Yes  Allergies  Allergen Reactions  . Morphine And Related Nausea And Vomiting  . Oxycodone Nausea And Vomiting    Chief Complaint  Patient presents with  . Follow-up    Follow-up form rehab.     HPI: Patient is a 81 y.o. male seen in the office today for follow up hospitalization and rehab stay. pt with history of diabetes, chf, afib, and ckd, OA of bilateral knees. Pt was hospitalized from 04/26/15-04/30/15 with near syncope and fall. xrays were obtained and showed no acute fractures. Ct of the knee showed hematoma but no large effusion.  Now at home with PT/OT but they have not come out to the home yet.  Orders were not in yesterday and office resent the orders for PT/OT over, PT scheduled to come this afternoon.  Left knee is still very painful (has been worse since he has been home). Taking tylenol at home. Has not been taking tramadol. Following with orthopedic next week. At last visit drained fluid and gave injection. Did not feel like he got much benefit from knee injection.   Blood sugars ranging around 150, metformin increased to 500 mg twice daily from daily in October.  Meal schedule was off while in therapy.   Lives in an in-law suite with his POA who checks on him frequently.  Review of Systems:  Review of Systems  Constitutional: Negative for activity change, appetite change, fatigue and unexpected weight change.  HENT: Negative for congestion.   Eyes: Negative.   Respiratory: Negative for cough and shortness of breath.   Cardiovascular: Negative for chest pain, palpitations and leg swelling.  Gastrointestinal: Negative for abdominal pain, diarrhea and constipation.  Genitourinary: Negative for dysuria and difficulty urinating.  Musculoskeletal: Positive for arthralgias and gait  problem. Negative for myalgias.       Bilateral knee pain, left worse than right, with swelling and bruising noted  Skin: Negative for wound.  Neurological: Negative for dizziness and weakness.  Psychiatric/Behavioral: Negative for behavioral problems, confusion and agitation.    Past Medical History  Diagnosis Date  . Hypertension   . Hypercholesteremia   . Stomach ulcer     years  . H/O hiatal hernia   . Varicose veins     both legs  . Arthritis     left knee  . Diabetes mellitus     diet controlled, pt does not check cbg  . Left rotator cuff tear Jul 12, 2012  . Edema   . Unspecified hereditary and idiopathic peripheral neuropathy   . Dizziness and giddiness   . Unspecified vitamin D deficiency   . Diaphragmatic hernia without mention of obstruction or gangrene    Past Surgical History  Procedure Laterality Date  . Esopagus strectching  2003 and 3 yrs ago  . Tonsillectomy  age 66  . Shoulder open rotator cuff repair  11/05/2011    Procedure: ROTATOR CUFF REPAIR SHOULDER OPEN;  Surgeon: Jacki Cones, MD;  Location: [facility];  Service: Orthopedics;  Laterality: Left;  Left Shoulder Rotator Cuff Repair Open with Graft and Anchors  . Reverse shoulder arthroplasty Right 09/17/2012    Procedure: REVERSE SHOULDER ARTHROPLASTY;  Surgeon: Verlee Rossetti, MD;  Location[facility];  Service: Orthopedics;  Laterality: Right;  . Orif ankle fracture Right 09/20/2012  Procedure: OPEN REDUCTION INTERNAL FIXATION (ORIF) ANKLE FRACTURE;  Surgeon: Johnn Hai, MD;  Location: [facility];  Service: Orthopedics;  Laterality: Right;  . Mastectomy      bilateral  . Cardioversion N/A 02/22/2013    Procedure: CARDIOVERSION;  Surgeon: Darlin Coco, MD;  Location: [facility] ENDOSCOPY;  Service: Cardiovascular;  Laterality: N/A;   Social History:   reports that he quit smoking about 18 years ago. His smoking use included Pipe and Cigarettes. He quit after 50 years of use. He has never used smokeless  tobacco. He reports that he drinks alcohol. He reports that he does not use illicit drugs.  Family History  Problem Relation Age of Onset  . Parkinson's disease Mother   . Hip fracture Mother   . Heart disease Mother   . Alzheimer's disease Mother   . Heart disease Father   . Parkinson's disease Sister   . Heart disease Brother     Medications: Patient's Medications  New Prescriptions   No medications on file  Previous Medications   ACETAMINOPHEN (TYLENOL) 325 MG TABLET    Take 2 tablets (650 mg total) by mouth every 6 (six) hours as needed for mild pain (or Fever >/= 101).   APIXABAN (ELIQUIS) 5 MG TABS TABLET    Take 5 mg by mouth 2 (two) times daily.   CALCIUM CARBONATE-VITAMIN D 600-200 MG-UNIT TABS    Take 1 tablet by mouth 2 (two) times daily. Take 1 tablet daily for vitamin supplement.   GLUCOSAMINE-CHONDROIT-VIT C-MN (GLUCOSAMINE 1500 COMPLEX) CAPS    Take one tablet once a day   GLUCOSE BLOOD TEST STRIP    True Test Glucose Test Strips Check blood sugar once daily Dx: E11.9   GLUCOSE MONITORING KIT (FREESTYLE) MONITORING KIT    1 each by Does not apply route as needed for other. Take blood sugar once daily.   LISINOPRIL (PRINIVIL,ZESTRIL) 20 MG TABLET    Take 20 mg by mouth daily.   METFORMIN (GLUCOPHAGE) 500 MG TABLET    Take 1 tablet by mouth twice daily   METOPROLOL TARTRATE (LOPRESSOR) 25 MG TABLET    TAKE 1 TABLET TWICE DAILY.   MULTIPLE VITAMIN (MULITIVITAMIN WITH MINERALS) TABS    Take 1 tablet by mouth daily.    OMEGA-3 FATTY ACIDS (FISH OIL) 1000 MG CAPS    Take 1,000 mg by mouth daily.    SIMVASTATIN (ZOCOR) 10 MG TABLET    TAKE ONE TABLET AT BEDTIME.   TRAMADOL (ULTRAM) 50 MG TABLET    Take 1 tablet (50 mg total) by mouth every 6 (six) hours as needed for severe pain.   TRUEPLUS LANCETS 30G MISC    Use to check blood sugar once daily. Dx E11.9  Modified Medications   No medications on file  Discontinued Medications   METFORMIN (GLUCOPHAGE) 500 MG TABLET    Take  1 tablet (500 mg total) by mouth daily with breakfast.     Physical Exam:  Filed Vitals:   05/29/15 1022  BP: 120/76  Pulse: 90  Temp: 97.9 F (36.6 C)  TempSrc: Oral  Resp: 20  Height: $Remove'5\' 5"'CYYWvcY$  (1.651 m)  Weight: 148 lb (67.132 kg)  SpO2: 93%   Body mass index is 24.63 kg/(m^2).  Physical Exam  Constitutional: He is oriented to person, place, and time. He appears well-nourished. No distress.  Frail elderly man  HENT:  Head: Normocephalic and atraumatic.  Eyes: Conjunctivae and EOM are normal. Pupils are equal, round, and reactive to light.  Neck: Normal range of motion. Neck supple.  Cardiovascular: Normal rate and normal heart sounds.  An irregularly irregular rhythm present.  Pulmonary/Chest: Effort normal and breath sounds normal.  Abdominal: Soft. Bowel sounds are normal.  Musculoskeletal: He exhibits edema and tenderness.  Edema and tenderness noted to right knee and lower leg  Neurological: He is alert and oriented to person, place, and time.  Skin: Skin is warm and dry. He is not diaphoretic.  Psychiatric: He has a normal mood and affect.    Labs reviewed: Basic Metabolic Panel:  Recent Labs  12/08/14 1102 04/19/15 1047 04/26/15 2020 04/27/15 0232  NA 137 142 133* 129*  K 5.3* 4.6 4.8 4.6  CL 100 103 98* 94*  CO2 $Re'23 23 24 24  'Ybd$ GLUCOSE 202* 146* 259* 302*  BUN 43* 26 41* 38*  CREATININE 1.15 1.05 1.13 1.17  CALCIUM 9.5 9.7 9.9 9.1  TSH 0.399* 0.530  --  0.156*   Liver Function Tests:  Recent Labs  12/08/14 1102  AST 20  ALT 22  ALKPHOS 94  BILITOT 0.7  PROT 6.2  ALBUMIN 4.1   No results for input(s): LIPASE, AMYLASE in the last 8760 hours. No results for input(s): AMMONIA in the last 8760 hours. CBC:  Recent Labs  12/08/14 1102 04/19/15 1047  04/26/15 2020 04/27/15 0232 04/28/15 0530 04/29/15 0612  WBC 11.5* 6.2  < > 15.0* 13.9* 9.1 7.8  NEUTROABS 10.0* 3.8  --  13.5*  --   --   --   HGB  --   --   --  13.9 12.3* 12.0* 11.5*  HCT  39.1 43.2  --  42.4 37.7* 37.3* 36.9*  MCV  --   --   --  83.5 84.0 84.2 84.6  PLT  --   --   --  191 190 161 149*  < > = values in this interval not displayed. Lipid Panel:  Recent Labs  12/08/14 1102  CHOL 168  HDL 53  LDLCALC 100*  TRIG 73  CHOLHDL 3.2   TSH:  Recent Labs  12/08/14 1102 04/19/15 1047 04/27/15 0232  TSH 0.399* 0.530 0.156*   A1C: Lab Results  Component Value Date   HGBA1C 7.7* 04/19/2015     Assessment/Plan 1. Type 2 diabetes mellitus with stage 3 chronic kidney disease, without long-term current use of insulin (HCC) -metformin increased to 500 mg BID at last visit due to worsening A1c -fasting blood sugars ~150.  - Comprehensive metabolic panel; Future - Hemoglobin A1c; Future  2. Chronic atrial fibrillation (HCC) -rate controlled, cont eliquis - CBC with Differential; Future  3. Left knee pain -worse after he has been home. Follow up with orthopedic next week -fall precautions. - traMADol (ULTRAM) 50 MG tablet; Take 1 tablet (50 mg total) by mouth every 6 (six) hours as needed for severe pain.  Dispense: 90 tablet; Refill: 0  4. Fall, subsequent encounter -fall after being out all day without proper nutrition or hydration.  -using walker now and has completed inpatient rehab -PT/OT planning to come out to home today for ongoing therapy  5. Essential hypertension -blood pressure stable, conts lisinopril and metoprolol  6. Unsteady gait Worse due to knee pain, home heath PT/OT coming out to house for ongoing therapy. conts to use walker  7. Hyponatremia -noted from hospitalization, will follow up lab - Basic metabolic panel  Follow up in January for routine follow up with lab work prior to visit  Iva Posten K. Harle Battiest  [place] [phone] 8 am - 5 pm) [phone] (after hours)

## 2015-05-30 ENCOUNTER — Telehealth: Payer: Self-pay | Admitting: Cardiology

## 2015-05-30 LAB — BASIC METABOLIC PANEL
BUN/Creatinine Ratio: 24 — ABNORMAL HIGH (ref 10–22)
BUN: 28 mg/dL (ref 10–36)
CALCIUM: 9.4 mg/dL (ref 8.6–10.2)
CHLORIDE: 102 mmol/L (ref 97–106)
CO2: 22 mmol/L (ref 18–29)
Creatinine, Ser: 1.15 mg/dL (ref 0.76–1.27)
GFR calc non Af Amer: 55 mL/min/{1.73_m2} — ABNORMAL LOW (ref >59)
GFR, EST AFRICAN AMERICAN: 64 mL/min/{1.73_m2} (ref >59)
GLUCOSE: 143 mg/dL — AB (ref 65–99)
POTASSIUM: 5 mmol/L (ref 3.5–5.2)
Sodium: 140 mmol/L (ref 136–144)

## 2015-05-30 NOTE — Telephone Encounter (Signed)
Clearance request for procedure:  Oral surgery - extraction of infected tooth.  Spoke w/ Dr. Feliberto Harts. He is doing single extraction, would strongly prefer to have patient hold his Eliquis if possible. There is not a date set for procedure at this time.  Will forward to Dr. Percival Spanish for review.  Can fax clearance to [phone] Office phone [phone]

## 2015-05-31 ENCOUNTER — Telehealth: Payer: Self-pay | Admitting: *Deleted

## 2015-05-31 DIAGNOSIS — R5381 Other malaise: Secondary | ICD-10-CM

## 2015-05-31 DIAGNOSIS — I10 Essential (primary) hypertension: Secondary | ICD-10-CM

## 2015-05-31 DIAGNOSIS — N186 End stage renal disease: Secondary | ICD-10-CM

## 2015-05-31 DIAGNOSIS — E1122 Type 2 diabetes mellitus with diabetic chronic kidney disease: Secondary | ICD-10-CM

## 2015-05-31 DIAGNOSIS — I482 Chronic atrial fibrillation, unspecified: Secondary | ICD-10-CM

## 2015-05-31 DIAGNOSIS — R2681 Unsteadiness on feet: Secondary | ICD-10-CM

## 2015-05-31 NOTE — Telephone Encounter (Signed)
Routed information to  Dr [facility] OFFICE SPOKE TO OFFICE ( BRANDON) TO LET HEM KNOW TO EXPECT THE INFORMATION

## 2015-05-31 NOTE — Telephone Encounter (Signed)
Orders placed.

## 2015-05-31 NOTE — Telephone Encounter (Signed)
Okay to send new referral for services

## 2015-05-31 NOTE — Telephone Encounter (Signed)
OK to hold Eliquis as needed for the procedure.  Usually stop two days prior to procedure.

## 2015-05-31 NOTE — Telephone Encounter (Signed)
Patient daughter, Jeani Hawking called and stated that they are having issues with the current HomeHealth and wants to switch. Currently has [facility]. They keep rescheduling their therapy appointments and very disorganized and unprofessional. Daughter has called [facility] and interested in them but needs a referral. Please Advise.

## 2015-06-02 DIAGNOSIS — I509 Heart failure, unspecified: Secondary | ICD-10-CM | POA: Diagnosis not present

## 2015-06-02 DIAGNOSIS — N186 End stage renal disease: Secondary | ICD-10-CM | POA: Diagnosis not present

## 2015-06-02 DIAGNOSIS — E1122 Type 2 diabetes mellitus with diabetic chronic kidney disease: Secondary | ICD-10-CM | POA: Diagnosis not present

## 2015-06-02 DIAGNOSIS — I132 Hypertensive heart and chronic kidney disease with heart failure and with stage 5 chronic kidney disease, or end stage renal disease: Secondary | ICD-10-CM | POA: Diagnosis not present

## 2015-06-02 DIAGNOSIS — I482 Chronic atrial fibrillation: Secondary | ICD-10-CM | POA: Diagnosis not present

## 2015-06-02 DIAGNOSIS — M170 Bilateral primary osteoarthritis of knee: Secondary | ICD-10-CM | POA: Diagnosis not present

## 2015-06-04 DIAGNOSIS — I132 Hypertensive heart and chronic kidney disease with heart failure and with stage 5 chronic kidney disease, or end stage renal disease: Secondary | ICD-10-CM | POA: Diagnosis not present

## 2015-06-04 DIAGNOSIS — N186 End stage renal disease: Secondary | ICD-10-CM | POA: Diagnosis not present

## 2015-06-04 DIAGNOSIS — I509 Heart failure, unspecified: Secondary | ICD-10-CM | POA: Diagnosis not present

## 2015-06-04 DIAGNOSIS — E1122 Type 2 diabetes mellitus with diabetic chronic kidney disease: Secondary | ICD-10-CM | POA: Diagnosis not present

## 2015-06-04 DIAGNOSIS — M170 Bilateral primary osteoarthritis of knee: Secondary | ICD-10-CM | POA: Diagnosis not present

## 2015-06-04 DIAGNOSIS — I482 Chronic atrial fibrillation: Secondary | ICD-10-CM | POA: Diagnosis not present

## 2015-06-05 DIAGNOSIS — S8002XD Contusion of left knee, subsequent encounter: Secondary | ICD-10-CM | POA: Diagnosis not present

## 2015-06-05 DIAGNOSIS — M170 Bilateral primary osteoarthritis of knee: Secondary | ICD-10-CM | POA: Diagnosis not present

## 2015-06-08 DIAGNOSIS — E1122 Type 2 diabetes mellitus with diabetic chronic kidney disease: Secondary | ICD-10-CM | POA: Diagnosis not present

## 2015-06-08 DIAGNOSIS — M170 Bilateral primary osteoarthritis of knee: Secondary | ICD-10-CM | POA: Diagnosis not present

## 2015-06-08 DIAGNOSIS — I482 Chronic atrial fibrillation: Secondary | ICD-10-CM | POA: Diagnosis not present

## 2015-06-08 DIAGNOSIS — I132 Hypertensive heart and chronic kidney disease with heart failure and with stage 5 chronic kidney disease, or end stage renal disease: Secondary | ICD-10-CM | POA: Diagnosis not present

## 2015-06-08 DIAGNOSIS — I509 Heart failure, unspecified: Secondary | ICD-10-CM | POA: Diagnosis not present

## 2015-06-08 DIAGNOSIS — N186 End stage renal disease: Secondary | ICD-10-CM | POA: Diagnosis not present

## 2015-06-11 DIAGNOSIS — I132 Hypertensive heart and chronic kidney disease with heart failure and with stage 5 chronic kidney disease, or end stage renal disease: Secondary | ICD-10-CM | POA: Diagnosis not present

## 2015-06-11 DIAGNOSIS — E1122 Type 2 diabetes mellitus with diabetic chronic kidney disease: Secondary | ICD-10-CM | POA: Diagnosis not present

## 2015-06-11 DIAGNOSIS — I482 Chronic atrial fibrillation: Secondary | ICD-10-CM | POA: Diagnosis not present

## 2015-06-11 DIAGNOSIS — N186 End stage renal disease: Secondary | ICD-10-CM | POA: Diagnosis not present

## 2015-06-11 DIAGNOSIS — I509 Heart failure, unspecified: Secondary | ICD-10-CM | POA: Diagnosis not present

## 2015-06-11 DIAGNOSIS — M170 Bilateral primary osteoarthritis of knee: Secondary | ICD-10-CM | POA: Diagnosis not present

## 2015-06-12 ENCOUNTER — Other Ambulatory Visit: Payer: Self-pay | Admitting: Internal Medicine

## 2015-06-12 ENCOUNTER — Other Ambulatory Visit: Payer: Self-pay | Admitting: *Deleted

## 2015-06-12 DIAGNOSIS — E1122 Type 2 diabetes mellitus with diabetic chronic kidney disease: Secondary | ICD-10-CM

## 2015-06-12 DIAGNOSIS — Z794 Long term (current) use of insulin: Principal | ICD-10-CM

## 2015-06-12 DIAGNOSIS — N181 Chronic kidney disease, stage 1: Principal | ICD-10-CM

## 2015-06-12 MED ORDER — FREESTYLE SYSTEM KIT: 1.0000 | PACK | Status: DC | PRN

## 2015-06-13 ENCOUNTER — Other Ambulatory Visit: Payer: Self-pay | Admitting: *Deleted

## 2015-06-13 DIAGNOSIS — N186 End stage renal disease: Secondary | ICD-10-CM | POA: Diagnosis not present

## 2015-06-13 DIAGNOSIS — I132 Hypertensive heart and chronic kidney disease with heart failure and with stage 5 chronic kidney disease, or end stage renal disease: Secondary | ICD-10-CM | POA: Diagnosis not present

## 2015-06-13 DIAGNOSIS — M170 Bilateral primary osteoarthritis of knee: Secondary | ICD-10-CM | POA: Diagnosis not present

## 2015-06-13 DIAGNOSIS — I482 Chronic atrial fibrillation: Secondary | ICD-10-CM | POA: Diagnosis not present

## 2015-06-13 DIAGNOSIS — E1122 Type 2 diabetes mellitus with diabetic chronic kidney disease: Secondary | ICD-10-CM | POA: Diagnosis not present

## 2015-06-13 DIAGNOSIS — I509 Heart failure, unspecified: Secondary | ICD-10-CM | POA: Diagnosis not present

## 2015-06-13 MED ORDER — TRUEPLUS LANCETS 30G MISC: Status: DC

## 2015-06-13 NOTE — Telephone Encounter (Signed)
[facility]  

## 2015-06-14 DIAGNOSIS — N186 End stage renal disease: Secondary | ICD-10-CM | POA: Diagnosis not present

## 2015-06-14 DIAGNOSIS — M170 Bilateral primary osteoarthritis of knee: Secondary | ICD-10-CM | POA: Diagnosis not present

## 2015-06-14 DIAGNOSIS — I509 Heart failure, unspecified: Secondary | ICD-10-CM | POA: Diagnosis not present

## 2015-06-14 DIAGNOSIS — E1122 Type 2 diabetes mellitus with diabetic chronic kidney disease: Secondary | ICD-10-CM | POA: Diagnosis not present

## 2015-06-14 DIAGNOSIS — I482 Chronic atrial fibrillation: Secondary | ICD-10-CM | POA: Diagnosis not present

## 2015-06-14 DIAGNOSIS — I132 Hypertensive heart and chronic kidney disease with heart failure and with stage 5 chronic kidney disease, or end stage renal disease: Secondary | ICD-10-CM | POA: Diagnosis not present

## 2015-06-15 DIAGNOSIS — N186 End stage renal disease: Secondary | ICD-10-CM | POA: Diagnosis not present

## 2015-06-15 DIAGNOSIS — E1122 Type 2 diabetes mellitus with diabetic chronic kidney disease: Secondary | ICD-10-CM | POA: Diagnosis not present

## 2015-06-15 DIAGNOSIS — M170 Bilateral primary osteoarthritis of knee: Secondary | ICD-10-CM | POA: Diagnosis not present

## 2015-06-15 DIAGNOSIS — I509 Heart failure, unspecified: Secondary | ICD-10-CM | POA: Diagnosis not present

## 2015-06-15 DIAGNOSIS — I132 Hypertensive heart and chronic kidney disease with heart failure and with stage 5 chronic kidney disease, or end stage renal disease: Secondary | ICD-10-CM | POA: Diagnosis not present

## 2015-06-15 DIAGNOSIS — I482 Chronic atrial fibrillation: Secondary | ICD-10-CM | POA: Diagnosis not present

## 2015-06-19 ENCOUNTER — Other Ambulatory Visit: Payer: Self-pay | Admitting: Nurse Practitioner

## 2015-06-19 DIAGNOSIS — M170 Bilateral primary osteoarthritis of knee: Secondary | ICD-10-CM | POA: Diagnosis not present

## 2015-06-19 DIAGNOSIS — I132 Hypertensive heart and chronic kidney disease with heart failure and with stage 5 chronic kidney disease, or end stage renal disease: Secondary | ICD-10-CM | POA: Diagnosis not present

## 2015-06-19 DIAGNOSIS — I509 Heart failure, unspecified: Secondary | ICD-10-CM | POA: Diagnosis not present

## 2015-06-19 DIAGNOSIS — I482 Chronic atrial fibrillation: Secondary | ICD-10-CM | POA: Diagnosis not present

## 2015-06-19 DIAGNOSIS — N186 End stage renal disease: Secondary | ICD-10-CM | POA: Diagnosis not present

## 2015-06-19 DIAGNOSIS — E1122 Type 2 diabetes mellitus with diabetic chronic kidney disease: Secondary | ICD-10-CM | POA: Diagnosis not present

## 2015-06-21 DIAGNOSIS — M170 Bilateral primary osteoarthritis of knee: Secondary | ICD-10-CM | POA: Diagnosis not present

## 2015-06-21 DIAGNOSIS — I132 Hypertensive heart and chronic kidney disease with heart failure and with stage 5 chronic kidney disease, or end stage renal disease: Secondary | ICD-10-CM | POA: Diagnosis not present

## 2015-06-21 DIAGNOSIS — N186 End stage renal disease: Secondary | ICD-10-CM | POA: Diagnosis not present

## 2015-06-21 DIAGNOSIS — E1122 Type 2 diabetes mellitus with diabetic chronic kidney disease: Secondary | ICD-10-CM | POA: Diagnosis not present

## 2015-06-21 DIAGNOSIS — I509 Heart failure, unspecified: Secondary | ICD-10-CM | POA: Diagnosis not present

## 2015-06-21 DIAGNOSIS — I482 Chronic atrial fibrillation: Secondary | ICD-10-CM | POA: Diagnosis not present

## 2015-06-26 DIAGNOSIS — N186 End stage renal disease: Secondary | ICD-10-CM | POA: Diagnosis not present

## 2015-06-26 DIAGNOSIS — I132 Hypertensive heart and chronic kidney disease with heart failure and with stage 5 chronic kidney disease, or end stage renal disease: Secondary | ICD-10-CM | POA: Diagnosis not present

## 2015-06-26 DIAGNOSIS — E1122 Type 2 diabetes mellitus with diabetic chronic kidney disease: Secondary | ICD-10-CM | POA: Diagnosis not present

## 2015-06-26 DIAGNOSIS — I482 Chronic atrial fibrillation: Secondary | ICD-10-CM | POA: Diagnosis not present

## 2015-06-26 DIAGNOSIS — M170 Bilateral primary osteoarthritis of knee: Secondary | ICD-10-CM | POA: Diagnosis not present

## 2015-06-26 DIAGNOSIS — I509 Heart failure, unspecified: Secondary | ICD-10-CM | POA: Diagnosis not present

## 2015-06-28 DIAGNOSIS — I482 Chronic atrial fibrillation: Secondary | ICD-10-CM | POA: Diagnosis not present

## 2015-06-28 DIAGNOSIS — M170 Bilateral primary osteoarthritis of knee: Secondary | ICD-10-CM | POA: Diagnosis not present

## 2015-06-28 DIAGNOSIS — E1122 Type 2 diabetes mellitus with diabetic chronic kidney disease: Secondary | ICD-10-CM | POA: Diagnosis not present

## 2015-06-28 DIAGNOSIS — I509 Heart failure, unspecified: Secondary | ICD-10-CM | POA: Diagnosis not present

## 2015-06-28 DIAGNOSIS — I132 Hypertensive heart and chronic kidney disease with heart failure and with stage 5 chronic kidney disease, or end stage renal disease: Secondary | ICD-10-CM | POA: Diagnosis not present

## 2015-06-28 DIAGNOSIS — N186 End stage renal disease: Secondary | ICD-10-CM | POA: Diagnosis not present

## 2015-07-02 DIAGNOSIS — I132 Hypertensive heart and chronic kidney disease with heart failure and with stage 5 chronic kidney disease, or end stage renal disease: Secondary | ICD-10-CM | POA: Diagnosis not present

## 2015-07-02 DIAGNOSIS — I509 Heart failure, unspecified: Secondary | ICD-10-CM | POA: Diagnosis not present

## 2015-07-02 DIAGNOSIS — E1122 Type 2 diabetes mellitus with diabetic chronic kidney disease: Secondary | ICD-10-CM | POA: Diagnosis not present

## 2015-07-02 DIAGNOSIS — M170 Bilateral primary osteoarthritis of knee: Secondary | ICD-10-CM | POA: Diagnosis not present

## 2015-07-02 DIAGNOSIS — I482 Chronic atrial fibrillation: Secondary | ICD-10-CM | POA: Diagnosis not present

## 2015-07-02 DIAGNOSIS — N186 End stage renal disease: Secondary | ICD-10-CM | POA: Diagnosis not present

## 2015-07-03 DIAGNOSIS — M170 Bilateral primary osteoarthritis of knee: Secondary | ICD-10-CM | POA: Diagnosis not present

## 2015-07-04 ENCOUNTER — Ambulatory Visit: Payer: Medicare Other | Admitting: Podiatry

## 2015-07-04 DIAGNOSIS — I509 Heart failure, unspecified: Secondary | ICD-10-CM | POA: Diagnosis not present

## 2015-07-04 DIAGNOSIS — I482 Chronic atrial fibrillation: Secondary | ICD-10-CM | POA: Diagnosis not present

## 2015-07-04 DIAGNOSIS — M170 Bilateral primary osteoarthritis of knee: Secondary | ICD-10-CM | POA: Diagnosis not present

## 2015-07-04 DIAGNOSIS — I132 Hypertensive heart and chronic kidney disease with heart failure and with stage 5 chronic kidney disease, or end stage renal disease: Secondary | ICD-10-CM | POA: Diagnosis not present

## 2015-07-04 DIAGNOSIS — E1122 Type 2 diabetes mellitus with diabetic chronic kidney disease: Secondary | ICD-10-CM | POA: Diagnosis not present

## 2015-07-04 DIAGNOSIS — N186 End stage renal disease: Secondary | ICD-10-CM | POA: Diagnosis not present

## 2015-07-13 DIAGNOSIS — I132 Hypertensive heart and chronic kidney disease with heart failure and with stage 5 chronic kidney disease, or end stage renal disease: Secondary | ICD-10-CM | POA: Diagnosis not present

## 2015-07-13 DIAGNOSIS — M170 Bilateral primary osteoarthritis of knee: Secondary | ICD-10-CM | POA: Diagnosis not present

## 2015-07-13 DIAGNOSIS — I482 Chronic atrial fibrillation: Secondary | ICD-10-CM | POA: Diagnosis not present

## 2015-07-13 DIAGNOSIS — N186 End stage renal disease: Secondary | ICD-10-CM | POA: Diagnosis not present

## 2015-07-13 DIAGNOSIS — E1122 Type 2 diabetes mellitus with diabetic chronic kidney disease: Secondary | ICD-10-CM | POA: Diagnosis not present

## 2015-07-13 DIAGNOSIS — I509 Heart failure, unspecified: Secondary | ICD-10-CM | POA: Diagnosis not present

## 2015-07-14 ENCOUNTER — Other Ambulatory Visit: Payer: Self-pay | Admitting: Cardiology

## 2015-07-17 ENCOUNTER — Other Ambulatory Visit: Payer: Self-pay | Admitting: *Deleted

## 2015-07-17 MED ORDER — METFORMIN HCL 500 MG PO TABS: ORAL_TABLET | ORAL | Status: DC

## 2015-07-17 NOTE — Telephone Encounter (Signed)
[facility]  

## 2015-07-17 NOTE — Telephone Encounter (Signed)
REFILL 

## 2015-07-18 ENCOUNTER — Other Ambulatory Visit: Payer: Self-pay | Admitting: *Deleted

## 2015-07-18 MED ORDER — METOPROLOL TARTRATE 25 MG PO TABS: 25.0000 mg | ORAL_TABLET | Freq: Two times a day (BID) | ORAL | Status: DC

## 2015-07-18 NOTE — Telephone Encounter (Signed)
Patient requested and faxed to pharmacy 

## 2015-07-19 DIAGNOSIS — I132 Hypertensive heart and chronic kidney disease with heart failure and with stage 5 chronic kidney disease, or end stage renal disease: Secondary | ICD-10-CM | POA: Diagnosis not present

## 2015-07-19 DIAGNOSIS — E1122 Type 2 diabetes mellitus with diabetic chronic kidney disease: Secondary | ICD-10-CM | POA: Diagnosis not present

## 2015-07-19 DIAGNOSIS — M170 Bilateral primary osteoarthritis of knee: Secondary | ICD-10-CM | POA: Diagnosis not present

## 2015-07-19 DIAGNOSIS — I509 Heart failure, unspecified: Secondary | ICD-10-CM | POA: Diagnosis not present

## 2015-07-19 DIAGNOSIS — N186 End stage renal disease: Secondary | ICD-10-CM | POA: Diagnosis not present

## 2015-07-19 DIAGNOSIS — I482 Chronic atrial fibrillation: Secondary | ICD-10-CM | POA: Diagnosis not present

## 2015-07-20 DIAGNOSIS — M170 Bilateral primary osteoarthritis of knee: Secondary | ICD-10-CM | POA: Diagnosis not present

## 2015-07-20 DIAGNOSIS — N186 End stage renal disease: Secondary | ICD-10-CM | POA: Diagnosis not present

## 2015-07-20 DIAGNOSIS — E1122 Type 2 diabetes mellitus with diabetic chronic kidney disease: Secondary | ICD-10-CM | POA: Diagnosis not present

## 2015-07-20 DIAGNOSIS — I482 Chronic atrial fibrillation: Secondary | ICD-10-CM | POA: Diagnosis not present

## 2015-07-20 DIAGNOSIS — I509 Heart failure, unspecified: Secondary | ICD-10-CM | POA: Diagnosis not present

## 2015-07-20 DIAGNOSIS — I132 Hypertensive heart and chronic kidney disease with heart failure and with stage 5 chronic kidney disease, or end stage renal disease: Secondary | ICD-10-CM | POA: Diagnosis not present

## 2015-07-23 ENCOUNTER — Ambulatory Visit: Payer: Medicare Other | Admitting: Podiatry

## 2015-07-24 DIAGNOSIS — N186 End stage renal disease: Secondary | ICD-10-CM | POA: Diagnosis not present

## 2015-07-24 DIAGNOSIS — E1122 Type 2 diabetes mellitus with diabetic chronic kidney disease: Secondary | ICD-10-CM | POA: Diagnosis not present

## 2015-07-24 DIAGNOSIS — I132 Hypertensive heart and chronic kidney disease with heart failure and with stage 5 chronic kidney disease, or end stage renal disease: Secondary | ICD-10-CM | POA: Diagnosis not present

## 2015-07-24 DIAGNOSIS — I509 Heart failure, unspecified: Secondary | ICD-10-CM | POA: Diagnosis not present

## 2015-07-24 DIAGNOSIS — M170 Bilateral primary osteoarthritis of knee: Secondary | ICD-10-CM | POA: Diagnosis not present

## 2015-07-24 DIAGNOSIS — I482 Chronic atrial fibrillation: Secondary | ICD-10-CM | POA: Diagnosis not present

## 2015-07-25 ENCOUNTER — Ambulatory Visit: Payer: Medicare Other | Admitting: Podiatry

## 2015-07-27 ENCOUNTER — Other Ambulatory Visit: Payer: Medicare Other

## 2015-07-27 DIAGNOSIS — I482 Chronic atrial fibrillation, unspecified: Secondary | ICD-10-CM

## 2015-07-27 DIAGNOSIS — N183 Chronic kidney disease, stage 3 (moderate): Secondary | ICD-10-CM | POA: Diagnosis not present

## 2015-07-27 DIAGNOSIS — E1122 Type 2 diabetes mellitus with diabetic chronic kidney disease: Secondary | ICD-10-CM

## 2015-07-28 LAB — CBC WITH DIFFERENTIAL/PLATELET
Basophils Absolute: 0 10*3/uL (ref 0.0–0.2)
Basos: 1 %
EOS (ABSOLUTE): 0.2 10*3/uL (ref 0.0–0.4)
Eos: 2 %
Hematocrit: 39.2 % (ref 37.5–51.0)
Hemoglobin: 12.4 g/dL — ABNORMAL LOW (ref 12.6–17.7)
IMMATURE GRANS (ABS): 0 10*3/uL (ref 0.0–0.1)
IMMATURE GRANULOCYTES: 0 %
LYMPHS: 16 %
Lymphocytes Absolute: 1.3 10*3/uL (ref 0.7–3.1)
MCH: 25.4 pg — ABNORMAL LOW (ref 26.6–33.0)
MCHC: 31.6 g/dL (ref 31.5–35.7)
MCV: 80 fL (ref 79–97)
MONOS ABS: 0.6 10*3/uL (ref 0.1–0.9)
Monocytes: 7 %
NEUTROS PCT: 74 %
Neutrophils Absolute: 6.3 10*3/uL (ref 1.4–7.0)
PLATELETS: 235 10*3/uL (ref 150–379)
RBC: 4.89 x10E6/uL (ref 4.14–5.80)
RDW: 16.6 % — AB (ref 12.3–15.4)
WBC: 8.5 10*3/uL (ref 3.4–10.8)

## 2015-07-28 LAB — HEMOGLOBIN A1C
ESTIMATED AVERAGE GLUCOSE: 157 mg/dL
HEMOGLOBIN A1C: 7.1 % — AB (ref 4.8–5.6)

## 2015-07-28 LAB — COMPREHENSIVE METABOLIC PANEL
A/G RATIO: 2.1 (ref 1.1–2.5)
ALK PHOS: 95 IU/L (ref 39–117)
ALT: 17 IU/L (ref 0–44)
AST: 18 IU/L (ref 0–40)
Albumin: 3.9 g/dL (ref 3.2–4.6)
BUN/Creatinine Ratio: 31 — ABNORMAL HIGH (ref 10–22)
BUN: 29 mg/dL (ref 10–36)
Bilirubin Total: 0.7 mg/dL (ref 0.0–1.2)
CALCIUM: 9.7 mg/dL (ref 8.6–10.2)
CO2: 23 mmol/L (ref 18–29)
CREATININE: 0.95 mg/dL (ref 0.76–1.27)
Chloride: 104 mmol/L (ref 96–106)
GFR calc Af Amer: 80 mL/min/{1.73_m2} (ref >59)
GFR, EST NON AFRICAN AMERICAN: 69 mL/min/{1.73_m2} (ref >59)
GLOBULIN, TOTAL: 1.9 g/dL (ref 1.5–4.5)
Glucose: 129 mg/dL — ABNORMAL HIGH (ref 65–99)
POTASSIUM: 4.7 mmol/L (ref 3.5–5.2)
SODIUM: 143 mmol/L (ref 134–144)
Total Protein: 5.8 g/dL — ABNORMAL LOW (ref 6.0–8.5)

## 2015-07-30 ENCOUNTER — Telehealth: Payer: Self-pay | Admitting: *Deleted

## 2015-07-30 DIAGNOSIS — E1122 Type 2 diabetes mellitus with diabetic chronic kidney disease: Secondary | ICD-10-CM | POA: Diagnosis not present

## 2015-07-30 DIAGNOSIS — I132 Hypertensive heart and chronic kidney disease with heart failure and with stage 5 chronic kidney disease, or end stage renal disease: Secondary | ICD-10-CM | POA: Diagnosis not present

## 2015-07-30 DIAGNOSIS — M170 Bilateral primary osteoarthritis of knee: Secondary | ICD-10-CM | POA: Diagnosis not present

## 2015-07-30 DIAGNOSIS — I482 Chronic atrial fibrillation: Secondary | ICD-10-CM | POA: Diagnosis not present

## 2015-07-30 DIAGNOSIS — I509 Heart failure, unspecified: Secondary | ICD-10-CM | POA: Diagnosis not present

## 2015-07-30 DIAGNOSIS — N186 End stage renal disease: Secondary | ICD-10-CM | POA: Diagnosis not present

## 2015-07-30 NOTE — Telephone Encounter (Signed)
Logan Bean with [facility] called wanting verbal orders for PT times 1 week. Verbal orders given.

## 2015-07-31 ENCOUNTER — Encounter: Payer: Self-pay | Admitting: Nurse Practitioner

## 2015-07-31 ENCOUNTER — Ambulatory Visit (INDEPENDENT_AMBULATORY_CARE_PROVIDER_SITE_OTHER): Payer: Medicare Other | Admitting: Nurse Practitioner

## 2015-07-31 ENCOUNTER — Other Ambulatory Visit: Payer: Medicare Other

## 2015-07-31 VITALS — BP 120/76 | HR 87 | Temp 97.4°F | Resp 20 | Ht 65.0 in | Wt 151.2 lb

## 2015-07-31 DIAGNOSIS — I10 Essential (primary) hypertension: Secondary | ICD-10-CM

## 2015-07-31 DIAGNOSIS — E785 Hyperlipidemia, unspecified: Secondary | ICD-10-CM | POA: Diagnosis not present

## 2015-07-31 DIAGNOSIS — I482 Chronic atrial fibrillation, unspecified: Secondary | ICD-10-CM

## 2015-07-31 DIAGNOSIS — E1122 Type 2 diabetes mellitus with diabetic chronic kidney disease: Secondary | ICD-10-CM | POA: Diagnosis not present

## 2015-07-31 DIAGNOSIS — N183 Chronic kidney disease, stage 3 unspecified: Secondary | ICD-10-CM

## 2015-07-31 DIAGNOSIS — M170 Bilateral primary osteoarthritis of knee: Secondary | ICD-10-CM

## 2015-07-31 NOTE — Patient Instructions (Signed)
Cont medications as prescribed

## 2015-07-31 NOTE — Progress Notes (Signed)
Patient ID: Logan Bean, male   DOB: [birth year], 82 y.o.   MRN: [id number]     PCP: Lauree Chandler, NP  Advanced Directive information Does patient have an advance directive?: Yes, Type of Advance Directive: [place];Living will, Does patient want to make changes to advanced directive?: No - Patient declined  Allergies  Allergen Reactions  . Morphine And Related Nausea And Vomiting  . Oxycodone Nausea And Vomiting    Chief Complaint  Patient presents with  . Medical Management of Chronic Issues    Routine follow-up visit, Labs printed     HPI: Patient is a 82 y.o. male seen in the office today for routine follow up on chronic conditions. Pt with a pmh of htn, arthritis, DM, neuropathy, hyperlipidemia. Pt was last seen after fall and rehab stay.  No other falls. Both knees bothering him today. He is due for another injection, has appt in 1 week. Takes tylenol which is fairly effective until he can get injection.  Eating well. Taking a protein supplement. Mixes some protein in milk to make chocolate milk. Pt reports his POA is always on him about making sure he gets enough protein.  No hypoglycemic episodes, conts on metformin.  Take blood pressure at home. Blood pressure stays consistent.  Was released yesterday from PT.  Review of Systems:  Review of Systems  Constitutional: Negative for activity change, appetite change, fatigue and unexpected weight change.  HENT: Negative for congestion.   Eyes: Negative.   Respiratory: Negative for cough and shortness of breath.   Cardiovascular: Negative for chest pain, palpitations and leg swelling.  Gastrointestinal: Negative for abdominal pain, diarrhea and constipation.  Genitourinary: Negative for dysuria and difficulty urinating.  Musculoskeletal: Positive for arthralgias and gait problem. Negative for myalgias.       Bilateral knee pain  Skin: Negative for wound.  Neurological: Negative for dizziness and  weakness.  Psychiatric/Behavioral: Negative for behavioral problems, confusion and agitation.    Past Medical History  Diagnosis Date  . Hypertension   . Hypercholesteremia   . Stomach ulcer     years  . H/O hiatal hernia   . Varicose veins     both legs  . Arthritis     left knee  . Diabetes mellitus     diet controlled, pt does not check cbg  . Left rotator cuff tear Jul 12, 2012  . Edema   . Unspecified hereditary and idiopathic peripheral neuropathy   . Dizziness and giddiness   . Unspecified vitamin D deficiency   . Diaphragmatic hernia without mention of obstruction or gangrene    Past Surgical History  Procedure Laterality Date  . Esopagus strectching  2003 and 3 yrs ago  . Tonsillectomy  age 13  . Shoulder open rotator cuff repair  11/05/2011    Procedure: ROTATOR CUFF REPAIR SHOULDER OPEN;  Surgeon: Tobi Bastos, MD;  Location: [facility];  Service: Orthopedics;  Laterality: Left;  Left Shoulder Rotator Cuff Repair Open with Graft and Anchors  . Reverse shoulder arthroplasty Right 09/17/2012    Procedure: REVERSE SHOULDER ARTHROPLASTY;  Surgeon: Augustin Schooling, MD;  Location: [facility];  Service: Orthopedics;  Laterality: Right;  . Orif ankle fracture Right 09/20/2012    Procedure: OPEN REDUCTION INTERNAL FIXATION (ORIF) ANKLE FRACTURE;  Surgeon: Johnn Hai, MD;  Location: [facility];  Service: Orthopedics;  Laterality: Right;  . Mastectomy      bilateral  . Cardioversion N/A 02/22/2013  Procedure: CARDIOVERSION;  Surgeon: Darlin Coco, MD;  Location: [facility] ENDOSCOPY;  Service: Cardiovascular;  Laterality: N/A;   Social History:   reports that he quit smoking about 19 years ago. His smoking use included Pipe and Cigarettes. He quit after 50 years of use. He has never used smokeless tobacco. He reports that he drinks alcohol. He reports that he does not use illicit drugs.  Family History  Problem Relation Age of Onset  . Parkinson's disease Mother   . Hip fracture  Mother   . Heart disease Mother   . Alzheimer's disease Mother   . Heart disease Father   . Parkinson's disease Sister   . Heart disease Brother     Medications: Patient's Medications  New Prescriptions   No medications on file  Previous Medications   ACETAMINOPHEN (TYLENOL) 325 MG TABLET    Take 2 tablets (650 mg total) by mouth every 6 (six) hours as needed for mild pain (or Fever >/= 101).   APIXABAN (ELIQUIS) 5 MG TABS TABLET    Take 5 mg by mouth 2 (two) times daily.   CALCIUM CARBONATE-VITAMIN D 600-200 MG-UNIT TABS    Take 1 tablet by mouth 2 (two) times daily. Take 1 tablet daily for vitamin supplement.   GLUCOSAMINE-CHONDROIT-VIT C-MN (GLUCOSAMINE 1500 COMPLEX) CAPS    Take one tablet once a day   GLUCOSE BLOOD (TRUETEST TEST) TEST STRIP    E11.22 check blood sugar once daily   GLUCOSE MONITORING KIT (FREESTYLE) MONITORING KIT    1 each by Does not apply route as needed for other. Take blood sugar once daily.   LISINOPRIL (PRINIVIL,ZESTRIL) 20 MG TABLET    TAKE 1 TABLET DAILY FOR BLOOD PRESSURE.   METFORMIN (GLUCOPHAGE) 500 MG TABLET    Take one tablet by mouth twice daily to control blood sugar   METOPROLOL TARTRATE (LOPRESSOR) 25 MG TABLET    Take 1 tablet (25 mg total) by mouth 2 (two) times daily.   MULTIPLE VITAMIN (MULITIVITAMIN WITH MINERALS) TABS    Take 1 tablet by mouth daily.    OMEGA-3 FATTY ACIDS (FISH OIL) 1000 MG CAPS    Take 1,000 mg by mouth daily.    SIMVASTATIN (ZOCOR) 10 MG TABLET    TAKE ONE TABLET AT BEDTIME.   TRUEPLUS LANCETS 30G MISC    Use to check blood sugar once daily. Dx E11.9  Modified Medications   No medications on file  Discontinued Medications   TRAMADOL (ULTRAM) 50 MG TABLET    Take 1 tablet (50 mg total) by mouth every 6 (six) hours as needed for severe pain.     Physical Exam:  Filed Vitals:   07/31/15 1020  BP: 120/76  Pulse: 87  Temp: 97.4 F (36.3 C)  TempSrc: Oral  Resp: 20  Height: '5\' 5"'  (1.651 m)  Weight: 151 lb 3.2 oz  (68.584 kg)  SpO2: 98%   Body mass index is 25.16 kg/(m^2).  Physical Exam  Constitutional: He is oriented to person, place, and time. He appears well-nourished. No distress.  Frail elderly man  HENT:  Head: Normocephalic and atraumatic.  Eyes: Conjunctivae and EOM are normal. Pupils are equal, round, and reactive to light.  Neck: Normal range of motion. Neck supple.  Cardiovascular: Normal rate and normal heart sounds.  An irregularly irregular rhythm present.  Pulmonary/Chest: Effort normal and breath sounds normal.  Abdominal: Soft. Bowel sounds are normal.  Musculoskeletal: He exhibits tenderness.  tenderness noted bilateral knees  Neurological: He is alert  and oriented to person, place, and time.  Skin: Skin is warm and dry. He is not diaphoretic.  Psychiatric: He has a normal mood and affect.    Labs reviewed: Basic Metabolic Panel:  Recent Labs  12/08/14 1102 04/19/15 1047  04/27/15 0232 05/29/15 1110 07/27/15 0952  NA 137 142  < > 129* 140 143  K 5.3* 4.6  < > 4.6 5.0 4.7  CL 100 103  < > 94* 102 104  CO2 23 23  < > '24 22 23  ' GLUCOSE 202* 146*  < > 302* 143* 129*  BUN 43* 26  < > 38* 28 29  CREATININE 1.15 1.05  < > 1.17 1.15 0.95  CALCIUM 9.5 9.7  < > 9.1 9.4 9.7  TSH 0.399* 0.530  --  0.156*  --   --   < > = values in this interval not displayed. Liver Function Tests:  Recent Labs  12/08/14 1102 07/27/15 0952  AST 20 18  ALT 22 17  ALKPHOS 94 95  BILITOT 0.7 0.7  PROT 6.2 5.8*  ALBUMIN 4.1 3.9   No results for input(s): LIPASE, AMYLASE in the last 8760 hours. No results for input(s): AMMONIA in the last 8760 hours. CBC:  Recent Labs  04/19/15 1047  04/26/15 2020 04/27/15 0232 04/28/15 0530 04/29/15 0612 07/27/15 0952  WBC 6.2  < > 15.0* 13.9* 9.1 7.8 8.5  NEUTROABS 3.8  --  13.5*  --   --   --  6.3  HGB  --   < > 13.9 12.3* 12.0* 11.5*  --   HCT 43.2  < > 42.4 37.7* 37.3* 36.9* 39.2  MCV 85  < > 83.5 84.0 84.2 84.6 80  PLT 211  < > 191  190 161 149* 235  < > = values in this interval not displayed. Lipid Panel:  Recent Labs  12/08/14 1102  CHOL 168  HDL 53  LDLCALC 100*  TRIG 73  CHOLHDL 3.2   TSH:  Recent Labs  12/08/14 1102 04/19/15 1047 04/27/15 0232  TSH 0.399* 0.530 0.156*   A1C: Lab Results  Component Value Date   HGBA1C 7.1* 07/27/2015     Assessment/Plan 1. Essential hypertension -blood pressure well controlled, cont current regimen - CMP; Future  2. Chronic atrial fibrillation (HCC) -rate controlled, on current regimen. remains on Eliquis for anticoagulation. hgb stable.  - CBC with Differential; Future  3. Type 2 diabetes mellitus with stage 3 chronic kidney disease, without long-term current use of insulin (HCC) -improved A1c, cont metformin 500 mg twice daily - Hemoglobin A1c; Future  4. CKD (chronic kidney disease) stage 3, GFR 30-59 ml/min -BUN/CR stable, encouraged good hydration, avoid dehydration and NSAIDs  5. Hyperlipidemia -conts on zocor, LDL 100 in may, cont heart healthy diet, will follow up lipids prior to next visit.  - Lipid panel; Future  6. Osteoarthritis of both knees, unspecified osteoarthritis type Scheduled injections, tylenol effective pain relief until he sees ortho.  Follow up in 6 months for EV with fasting labs prior to visit.  Carlos American. Harle Battiest  [facility] [phone] 8 am - 5 pm) [phone] (after hours)

## 2015-08-02 ENCOUNTER — Ambulatory Visit: Payer: Medicare Other | Admitting: Nurse Practitioner

## 2015-08-07 DIAGNOSIS — M170 Bilateral primary osteoarthritis of knee: Secondary | ICD-10-CM | POA: Diagnosis not present

## 2015-08-14 ENCOUNTER — Ambulatory Visit (INDEPENDENT_AMBULATORY_CARE_PROVIDER_SITE_OTHER): Payer: Medicare Other | Admitting: Podiatry

## 2015-08-14 ENCOUNTER — Encounter: Payer: Self-pay | Admitting: Podiatry

## 2015-08-14 DIAGNOSIS — M79676 Pain in unspecified toe(s): Secondary | ICD-10-CM

## 2015-08-14 DIAGNOSIS — B351 Tinea unguium: Secondary | ICD-10-CM | POA: Diagnosis not present

## 2015-08-14 NOTE — Progress Notes (Signed)
Patient ID: Logan Bean, male   DOB: [birth year], 82 y.o.   MRN: [id number]  Subjective: This patient presents today complaining of thickened and elongated toenails which are uncomfortable when walking wearing shoes and requests toenail debridement. Patient has been in rehabilitation since the visit of 02/07/2015 recovering from a fall  Objective: No open skin lesions bilaterally The toenails are extremely elongated, brittle, deformed, discolored and tender direct palpation 6-10  Assessment: Neglected symptomatic onychomycoses 6-10 Type II diabetic  Plan: Debridement toenails 6-10 mechanically and elected without any bleeding  Reappoint 3 months

## 2015-08-14 NOTE — Patient Instructions (Signed)
Place a Band-Aid and Vaseline over the bump on the top of the right foot daily  Diabetes and Foot Care Diabetes may cause you to have problems because of poor blood supply (circulation) to your feet and legs. This may cause the skin on your feet to become thinner, break easier, and heal more slowly. Your skin may become dry, and the skin may peel and crack. You may also have nerve damage in your legs and feet causing decreased feeling in them. You may not notice minor injuries to your feet that could lead to infections or more serious problems. Taking care of your feet is one of the most important things you can do for yourself.  HOME CARE INSTRUCTIONS  Wear shoes at all times, even in the house. Do not go barefoot. Bare feet are easily injured.  Check your feet daily for blisters, cuts, and redness. If you cannot see the bottom of your feet, use a mirror or ask someone for help.  Wash your feet with warm water (do not use hot water) and mild soap. Then pat your feet and the areas between your toes until they are completely dry. Do not soak your feet as this can dry your skin.  Apply a moisturizing lotion or petroleum jelly (that does not contain alcohol and is unscented) to the skin on your feet and to dry, brittle toenails. Do not apply lotion between your toes.  Trim your toenails straight across. Do not dig under them or around the cuticle. File the edges of your nails with an emery board or nail file.  Do not cut corns or calluses or try to remove them with medicine.  Wear clean socks or stockings every day. Make sure they are not too tight. Do not wear knee-high stockings since they may decrease blood flow to your legs.  Wear shoes that fit properly and have enough cushioning. To break in new shoes, wear them for just a few hours a day. This prevents you from injuring your feet. Always look in your shoes before you put them on to be sure there are no objects inside.  Do not cross your  legs. This may decrease the blood flow to your feet.  If you find a minor scrape, cut, or break in the skin on your feet, keep it and the skin around it clean and dry. These areas may be cleansed with mild soap and water. Do not cleanse the area with peroxide, alcohol, or iodine.  When you remove an adhesive bandage, be sure not to damage the skin around it.  If you have a wound, look at it several times a day to make sure it is healing.  Do not use heating pads or hot water bottles. They may burn your skin. If you have lost feeling in your feet or legs, you may not know it is happening until it is too late.  Make sure your health care provider performs a complete foot exam at least annually or more often if you have foot problems. Report any cuts, sores, or bruises to your health care provider immediately. SEEK MEDICAL CARE IF:   You have an injury that is not healing.  You have cuts or breaks in the skin.  You have an ingrown nail.  You notice redness on your legs or feet.  You feel burning or tingling in your legs or feet.  You have pain or cramps in your legs and feet.  Your legs or feet are numb.  Your feet always feel cold. SEEK IMMEDIATE MEDICAL CARE IF:   There is increasing redness, swelling, or pain in or around a wound.  There is a red line that goes up your leg.  Pus is coming from a wound.  You develop a fever or as directed by your health care provider.  You notice a bad smell coming from an ulcer or wound.   This information is not intended to replace advice given to you by your health care provider. Make sure you discuss any questions you have with your health care provider.   Document Released: 06/27/2000 Document Revised: 03/02/2013 Document Reviewed: 12/07/2012 Elsevier Interactive Patient Education [employer].

## 2015-09-03 ENCOUNTER — Other Ambulatory Visit: Payer: Self-pay | Admitting: Nurse Practitioner

## 2015-09-06 DIAGNOSIS — M170 Bilateral primary osteoarthritis of knee: Secondary | ICD-10-CM | POA: Diagnosis not present

## 2015-09-18 ENCOUNTER — Other Ambulatory Visit: Payer: Self-pay | Admitting: Nurse Practitioner

## 2015-09-25 ENCOUNTER — Other Ambulatory Visit: Payer: Self-pay | Admitting: Nurse Practitioner

## 2015-10-02 DIAGNOSIS — M170 Bilateral primary osteoarthritis of knee: Secondary | ICD-10-CM | POA: Diagnosis not present

## 2015-10-30 ENCOUNTER — Other Ambulatory Visit: Payer: Self-pay | Admitting: Nurse Practitioner

## 2015-11-01 DIAGNOSIS — M170 Bilateral primary osteoarthritis of knee: Secondary | ICD-10-CM | POA: Diagnosis not present

## 2015-11-02 ENCOUNTER — Encounter: Payer: Self-pay | Admitting: Internal Medicine

## 2015-11-02 ENCOUNTER — Ambulatory Visit (INDEPENDENT_AMBULATORY_CARE_PROVIDER_SITE_OTHER): Payer: Medicare Other | Admitting: Internal Medicine

## 2015-11-02 VITALS — BP 110/82 | HR 88 | Temp 97.6°F | Ht 65.0 in | Wt 145.0 lb

## 2015-11-02 DIAGNOSIS — I83013 Varicose veins of right lower extremity with ulcer of ankle: Secondary | ICD-10-CM | POA: Diagnosis not present

## 2015-11-02 DIAGNOSIS — I83003 Varicose veins of unspecified lower extremity with ulcer of ankle: Secondary | ICD-10-CM | POA: Insufficient documentation

## 2015-11-02 DIAGNOSIS — L97309 Non-pressure chronic ulcer of unspecified ankle with unspecified severity: Secondary | ICD-10-CM | POA: Insufficient documentation

## 2015-11-02 DIAGNOSIS — R634 Abnormal weight loss: Secondary | ICD-10-CM | POA: Diagnosis not present

## 2015-11-02 DIAGNOSIS — L97319 Non-pressure chronic ulcer of right ankle with unspecified severity: Secondary | ICD-10-CM

## 2015-11-02 NOTE — Progress Notes (Signed)
Patient ID: Logan Bean, male   DOB: [birth year], 81 y.o.   MRN: [id number]   Location:  [facility] clinic Provider: Tiffany L. Mariea Clonts, D.O., C.M.D.  Code Status: DNR Goals of Care:  Advanced Directives 11/02/2015  Does patient have an advance directive? Yes  Type of Paramedic of [place];Living will  Does patient want to make changes to advanced directive? -  Copy of advanced directive(s) in chart? No - copy requested     Chief Complaint  Patient presents with  . Blister    right ankle for 6 days, foot was swelling and up leg. Here with friend Nicki Reaper    HPI: Patient is a 82 y.o. male patient of Jessica's with a h/o htn, CHF, afib on chronic anticoagulation, GERD, DMII with CKD, multinodular goiter with "subclinical hyperthyroidism", chronic venous insufficiency with stasis dermatitis, hyperlipidemia seen today for an acute visit for right ankle blister for 6 days with swelling of the left foot and leg.   He has lost 6 lbs in 3 mos.  His friend reports that he does not eat much protein, but does take supplements.  He also has fluctuations in his fluid from his chf.  He is checking his glucose levels.  Does weigh at home also.  Past Medical History  Diagnosis Date  . Hypertension   . Hypercholesteremia   . Stomach ulcer     years  . H/O hiatal hernia   . Varicose veins     both legs  . Arthritis     left knee  . Diabetes mellitus     diet controlled, pt does not check cbg  . Left rotator cuff tear Jul 12, 2012  . Edema   . Unspecified hereditary and idiopathic peripheral neuropathy   . Dizziness and giddiness   . Unspecified vitamin D deficiency   . Diaphragmatic hernia without mention of obstruction or gangrene     Past Surgical History  Procedure Laterality Date  . Esopagus strectching  2003 and 3 yrs ago  . Tonsillectomy  age 10  . Shoulder open rotator cuff repair  11/05/2011    Procedure: ROTATOR CUFF REPAIR SHOULDER OPEN;  Surgeon: Tobi Bastos, MD;  Location: [facility];  Service: Orthopedics;  Laterality: Left;  Left Shoulder Rotator Cuff Repair Open with Graft and Anchors  . Reverse shoulder arthroplasty Right 09/17/2012    Procedure: REVERSE SHOULDER ARTHROPLASTY;  Surgeon: Augustin Schooling, MD;  Location: [facility];  Service: Orthopedics;  Laterality: Right;  . Orif ankle fracture Right 09/20/2012    Procedure: OPEN REDUCTION INTERNAL FIXATION (ORIF) ANKLE FRACTURE;  Surgeon: Johnn Hai, MD;  Location: [facility];  Service: Orthopedics;  Laterality: Right;  . Mastectomy      bilateral  . Cardioversion N/A 02/22/2013    Procedure: CARDIOVERSION;  Surgeon: Darlin Coco, MD;  Location: [facility];  Service: Cardiovascular;  Laterality: N/A;    Allergies  Allergen Reactions  . Morphine And Related Nausea And Vomiting  . Oxycodone Nausea And Vomiting      Medication List       This list is accurate as of: 11/02/15  9:47 AM.  Always use your most recent med list.               acetaminophen 325 MG tablet  Commonly known as:  TYLENOL  Take 2 tablets (650 mg total) by mouth every 6 (six) hours as needed for mild pain (or Fever >/= 101).     Calcium  Carbonate-Vitamin D 600-200 MG-UNIT Tabs  Take 1 tablet by mouth 2 (two) times daily. Take 1 tablet daily for vitamin supplement.     ELIQUIS 5 MG Tabs tablet  Generic drug:  apixaban  TAKE 1 TABLET TWICE DAILY.     Fish Oil 1000 MG Caps  Take 1,000 mg by mouth daily.     furosemide 20 MG tablet  Commonly known as:  LASIX  TAKE (1/2) TABLET ONCE A DAY FOR EDEMA, HYPERTENSION.     GLUCOSAMINE 1500 COMPLEX Caps  Take one tablet once a day     glucose blood test strip  Commonly known as:  TRUETEST TEST  E11.22 check blood sugar once daily     glucose monitoring kit monitoring kit  1 each by Does not apply route as needed for other. Take blood sugar once daily.     lisinopril 20 MG tablet  Commonly known as:  PRINIVIL,ZESTRIL  TAKE 1 TABLET DAILY FOR BLOOD  PRESSURE.     metFORMIN 500 MG tablet  Commonly known as:  GLUCOPHAGE  Take one tablet by mouth twice daily to control blood sugar     metoprolol tartrate 25 MG tablet  Commonly known as:  LOPRESSOR  TAKE 1 TABLET TWICE DAILY.     multivitamin with minerals Tabs tablet  Take 1 tablet by mouth daily.     simvastatin 10 MG tablet  Commonly known as:  ZOCOR  TAKE ONE TABLET AT BEDTIME.     TRUEPLUS LANCETS 30G Misc  Use to check blood sugar once daily. Dx E11.9        Review of Systems:  Review of Systems  Constitutional: Positive for weight loss. Negative for malaise/fatigue.  Respiratory: Negative for shortness of breath.   Cardiovascular: Negative for chest pain and leg swelling.  Gastrointestinal: Negative for abdominal pain.  Genitourinary: Negative for dysuria.  Musculoskeletal: Negative for falls.  Skin:       Stasis dermatitis; right medial malleolus tender  Neurological: Negative for dizziness and weakness.  Psychiatric/Behavioral: Positive for memory loss.    Health Maintenance  Topic Date Due  . ZOSTAVAX  09/11/1982  . FOOT EXAM  01/12/2016  . HEMOGLOBIN A1C  01/24/2016  . INFLUENZA VACCINE  02/12/2016  . OPHTHALMOLOGY EXAM  02/12/2016  . TETANUS/TDAP  07/12/2021  . PNA vac Low Risk Adult  Completed    Physical Exam: Filed Vitals:   11/02/15 0938  BP: 110/82  Pulse: 88  Temp: 97.6 F (36.4 C)  TempSrc: Oral  Height: 5' 5" (1.651 m)  Weight: 145 lb (65.772 kg)  SpO2: 95%   Body mass index is 24.13 kg/(m^2). Physical Exam  Constitutional: No distress.  Cardiovascular:  irreg irreg  Pulmonary/Chest: Breath sounds normal.  Dyspneic on exertion  Skin: Skin is warm and dry.  Right medial malleolus tender to touch and edematous locally, but no remaining edema of right foot or ankle, no open ulcer remaining (pt reports closed 3 days back), no drainage, no erythema; venous stasis dermatitis of leg    Labs reviewed: Basic Metabolic  Panel:  Recent Labs  12/08/14 1102 04/19/15 1047  04/27/15 0232 05/29/15 1110 07/27/15 0952  NA 137 142  < > 129* 140 143  K 5.3* 4.6  < > 4.6 5.0 4.7  CL 100 103  < > 94* 102 104  CO2 23 23  < > _0 GLUCOSE 202* 146*  < > 302* 143* 129*  BUN 43* 26  < > 38*  28 29  CREATININE 1.15 1.05  < > 1.17 1.15 0.95  CALCIUM 9.5 9.7  < > 9.1 9.4 9.7  TSH 0.399* 0.530  --  0.156*  --   --   < > = values in this interval not displayed. Liver Function Tests:  Recent Labs  12/08/14 1102 07/27/15 0952  AST 20 18  ALT 22 17  ALKPHOS 94 95  BILITOT 0.7 0.7  PROT 6.2 5.8*  ALBUMIN 4.1 3.9   No results for input(s): LIPASE, AMYLASE in the last 8760 hours. No results for input(s): AMMONIA in the last 8760 hours. CBC:  Recent Labs  04/19/15 1047  04/26/15 2020 04/27/15 0232 04/28/15 0530 04/29/15 0612 07/27/15 0952  WBC 6.2  < > 15.0* 13.9* 9.1 7.8 8.5  NEUTROABS 3.8  --  13.5*  --   --   --  6.3  HGB  --   < > 13.9 12.3* 12.0* 11.5*  --   HCT 43.2  < > 42.4 37.7* 37.3* 36.9* 39.2  MCV 85  < > 83.5 84.0 84.2 84.6 80  PLT 211  < > 191 190 161 149* 235  < > = values in this interval not displayed. Lipid Panel:  Recent Labs  12/08/14 1102  CHOL 168  HDL 53  LDLCALC 100*  TRIG 73  CHOLHDL 3.2   Lab Results  Component Value Date   HGBA1C 7.1* 07/27/2015    Assessment/Plan 1. Venous ulcer of ankle, right (HCC) -seems the ulcer itself healed promptly, but the medial malleolus remains very tender -will continue to elevate feet at rest in recliner -avoid pressure on sore area -cont lasix daily -notify us if develops signs of infection -avoid shoes that caused it  2.  Loss of weight -down 6 lbs in three mos -may be fluid, but also intake not fabulous -continue supplements for protein  -friend going to help remind him about his intake -could consider changing his diabetes medicine to tradjenta in place of the metformin if he continues to lose weight and glucose  still well controlled for his age with this regimen  Next appt:  01/28/2016 keep with Janett Billow  Tiffany L. Reed, D.O. [facility] [address], [place] [address] Cell Phone (Mon-Fri 8am-5pm):  [phone] On Call:  [phone] & follow prompts after 5pm & weekends Office Phone:  [phone] Office Fax:  [phone]

## 2015-11-02 NOTE — Patient Instructions (Signed)
Elevate feet at rest as much possible. Avoid pressure on the sore area. If you notice redness, increased warmth, foul drainage from the sore area, let us know right away so we can see you again and prescribe antibiotics.   Try to wear a matching pair of shoes that does not rub over the sore area.

## 2015-11-12 ENCOUNTER — Other Ambulatory Visit: Payer: Self-pay | Admitting: Nurse Practitioner

## 2015-11-21 ENCOUNTER — Ambulatory Visit (INDEPENDENT_AMBULATORY_CARE_PROVIDER_SITE_OTHER): Payer: Medicare Other | Admitting: Internal Medicine

## 2015-11-21 ENCOUNTER — Encounter: Payer: Self-pay | Admitting: Internal Medicine

## 2015-11-21 ENCOUNTER — Ambulatory Visit: Payer: Medicare Other | Admitting: Podiatry

## 2015-11-21 VITALS — BP 138/80 | HR 97 | Temp 98.1°F | Resp 17 | Ht 65.0 in | Wt 144.2 lb

## 2015-11-21 DIAGNOSIS — I789 Disease of capillaries, unspecified: Secondary | ICD-10-CM | POA: Insufficient documentation

## 2015-11-21 NOTE — Progress Notes (Signed)
Patient ID: Logan Bean, male   DOB: 09/06/1920, 82 y.o.   MRN: [id number]    Facility  [facility]    Place of Service:   OFFICE    Allergies  Allergen Reactions  . Morphine And Related Nausea And Vomiting  . Oxycodone Nausea And Vomiting    Chief Complaint  Patient presents with  . Acute Visit    Sore on top of  R foot x 2 weeks.  . OTHER    [name], in room with patient.     HPI:  Patient had a small sore area at a bone spur of the medial side of right foot. He has some sort of a Band-Aid which covered across the dorsum of the right foot. He developed a vascular blush with erythema, but no skin breakdown with weeping. He became anxious about this because he is diabetic. There has been no warmth to the area. Patient has vascular insufficiency of the feet. Feet are somewhat bluish in color. There is no pain.  Medications: Patient's Medications  New Prescriptions   No medications on file  Previous Medications   ACETAMINOPHEN (TYLENOL) 325 MG TABLET    Take 2 tablets (650 mg total) by mouth every 6 (six) hours as needed for mild pain (or Fever >/= 101).   CALCIUM CARBONATE-VITAMIN D 600-200 MG-UNIT TABS    Take 1 tablet by mouth 2 (two) times daily. Take 1 tablet daily for vitamin supplement.   ELIQUIS 5 MG TABS TABLET    TAKE 1 TABLET TWICE DAILY.   FUROSEMIDE (LASIX) 20 MG TABLET    TAKE (1/2) TABLET ONCE A DAY FOR EDEMA, HYPERTENSION.   GLUCOSAMINE-CHONDROIT-VIT C-MN (GLUCOSAMINE 1500 COMPLEX) CAPS    Take one tablet once a day   GLUCOSE BLOOD (TRUETEST TEST) TEST STRIP    E11.22 check blood sugar once daily   GLUCOSE MONITORING KIT (FREESTYLE) MONITORING KIT    1 each by Does not apply route as needed for other. Take blood sugar once daily.   LISINOPRIL (PRINIVIL,ZESTRIL) 20 MG TABLET    TAKE 1 TABLET DAILY FOR BLOOD PRESSURE.   METFORMIN (GLUCOPHAGE) 500 MG TABLET    TAKE ONE TABLET TWICE A DAY TO CONTROL BLOOD SUGAR.   METOPROLOL TARTRATE (LOPRESSOR) 25 MG TABLET    TAKE  1 TABLET TWICE DAILY.   MULTIPLE VITAMIN (MULITIVITAMIN WITH MINERALS) TABS    Take 1 tablet by mouth daily.    OMEGA-3 FATTY ACIDS (FISH OIL) 1000 MG CAPS    Take 1,000 mg by mouth daily.    SIMVASTATIN (ZOCOR) 10 MG TABLET    TAKE ONE TABLET AT BEDTIME.   TRUEPLUS LANCETS 30G MISC    Use to check blood sugar once daily. Dx E11.9  Modified Medications   No medications on file  Discontinued Medications   No medications on file    Review of Systems  Constitutional: Positive for unexpected weight change.  HENT: Positive for hearing loss (Mild).   Respiratory: Negative for shortness of breath.   Cardiovascular: Positive for leg swelling (1+ in the right leg. Venous insufficiency.). Negative for chest pain.       Peripheral vascular insufficiency.chronic atrial fibrillation.  Gastrointestinal: Negative for abdominal pain.  Endocrine:       Diabetes mellitus  Genitourinary: Negative for dysuria.  Musculoskeletal: Positive for gait problem (Using walker with front wheels and rear skids).  Skin:       Stasis dermatitis; right medial malleolus tender  Neurological: Negative for dizziness and weakness.  Psychiatric/Behavioral: The patient is nervous/anxious.     Filed Vitals:   11/21/15 1405  BP: 138/80  Pulse: 97  Temp: 98.1 F (36.7 C)  TempSrc: Oral  Resp: 17  Height: '5\' 5"'$  (1.651 m)  Weight: 144 lb 3.2 oz (65.409 kg)  SpO2: 92%   Body mass index is 24 kg/(m^2). Filed Weights   11/21/15 1405  Weight: 144 lb 3.2 oz (65.409 kg)     Physical Exam  Constitutional: He is oriented to person, place, and time. He appears well-nourished. No distress.  Frail elderly man  HENT:  Head: Normocephalic and atraumatic.  Eyes: Conjunctivae and EOM are normal. Pupils are equal, round, and reactive to light.  Neck: Normal range of motion. Neck supple.  Cardiovascular: Normal rate and normal heart sounds.  An irregularly irregular rhythm present.  Atrial fibrillation Diminished DP and  PT bilaterally  Pulmonary/Chest: Effort normal and breath sounds normal.  Abdominal: Soft. Bowel sounds are normal.  Musculoskeletal: He exhibits edema and tenderness (Both knees).  Walker with front wheels and rear skids. Unstable gait. Weakened quadriceps muscles bilaterally. Struggles to get out of a chair.  Neurological: He is alert and oriented to person, place, and time.  Diminished sensation to vibration and monofilament testing bilaterally./  Skin: Skin is warm and dry. He is not diaphoretic.  Vascular blush across the dorsum of the right foot. Small area of increased erythema at a bone spur on the right foot medially.  Psychiatric: He has a normal mood and affect.    Labs reviewed: Lab Summary Latest Ref Rng 07/27/2015 05/29/2015 04/29/2015 04/28/2015 04/27/2015  Hemoglobin 12.6 - 17.7 g/dL 12.4(L) (None) 11.5(L) 12.0(L) 12.3(L)  Hematocrit 37.5 - 51.0 % 39.2 (None) 36.9(L) 37.3(L) 37.7(L)  White count 3.4 - 10.8 x10E3/uL 8.5 (None) 7.8 9.1 13.9(H)  Platelet count 150 - 379 x10E3/uL 235 (None) 149(L) 161 190  Sodium 134 - 144 mmol/L 143 140 (None) (None) 129(L)  Potassium 3.5 - 5.2 mmol/L 4.7 5.0 (None) (None) 4.6  Calcium 8.6 - 10.2 mg/dL 9.7 9.4 (None) (None) 9.1  Phosphorus - (None) (None) (None) (None) (None)  Creatinine 0.76 - 1.27 mg/dL 0.95 1.15 (None) (None) 1.17  AST 0 - 40 IU/L 18 (None) (None) (None) (None)  Alk Phos 39 - 117 IU/L 95 (None) (None) (None) (None)  Bilirubin 0.0 - 1.2 mg/dL 0.7 (None) (None) (None) (None)  Glucose 65 - 99 mg/dL 129(H) 143(H) (None) (None) 302(H)  Cholesterol - (None) (None) (None) (None) (None)  HDL cholesterol - (None) (None) (None) (None) (None)  Triglycerides - (None) (None) (None) (None) (None)  LDL Direct - (None) (None) (None) (None) (None)  LDL Calc - (None) (None) (None) (None) (None)  Total protein - (None) (None) (None) (None) (None)  Albumin 3.2 - 4.6 g/dL 3.9 (None) (None) (None) (None)   Lab Results  Component Value  Date   TSH 0.156* 04/27/2015   TSH 0.530 04/19/2015   TSH 0.399* 12/08/2014   T3TOTAL 124.2 01/21/2013   T4TOTAL 7.4 10/04/2013   Lab Results  Component Value Date   BUN 29 07/27/2015   BUN 28 05/29/2015   BUN 38* 04/27/2015   Lab Results  Component Value Date   HGBA1C 7.1* 07/27/2015   HGBA1C 7.7* 04/19/2015   HGBA1C 7.4* 12/08/2014    Assessment/Plan  1. Capillary vascular disease I don't think anything needs to be done about this vascular blush on the dorsum of the right foot. Patient was advised that should skin breakdown take place that creates  weeping that he should get back in touch with our office for an appointment. The area does not need to be covered. Elevation of the feet and legs when possible during the day would be appropriate to diminish the edema.  2. Weight loss Discussed diet and increasing caloric intake. Milk shakes, food bars, or nutritional supplements would all be appropriate.

## 2015-11-29 DIAGNOSIS — M170 Bilateral primary osteoarthritis of knee: Secondary | ICD-10-CM | POA: Diagnosis not present

## 2015-12-04 ENCOUNTER — Ambulatory Visit (INDEPENDENT_AMBULATORY_CARE_PROVIDER_SITE_OTHER): Payer: Medicare Other | Admitting: Podiatry

## 2015-12-04 ENCOUNTER — Encounter: Payer: Self-pay | Admitting: Podiatry

## 2015-12-04 DIAGNOSIS — B351 Tinea unguium: Secondary | ICD-10-CM

## 2015-12-04 DIAGNOSIS — M79676 Pain in unspecified toe(s): Secondary | ICD-10-CM

## 2015-12-04 NOTE — Progress Notes (Signed)
Patient ID: Logan Bean, male   DOB: Nov 27, 1920, 82 y.o.   MRN: [id number]   Subjective: This patient presents today complaining of thickened and elongated toenails which are uncomfortable when walking wearing shoes and requests toenail debridement. Patient has been in rehabilitation since the visit of 02/07/2015 recovering from a fall  Objective: Orientated 3 Hammertoes 2-5 bilaterally Keratoses distal second left toe No open skin lesions bilaterally The toenails are extremely elongated, brittle, deformed, discolored and tender direct palpation 6-10  Assessment: Neglected symptomatic onychomycoses 6-10 Type II diabetic  Plan: Debridement toenails 6-10 mechanically and elected without any bleeding Right keratoses 1 without any bleeding  Reappoint 3 months

## 2015-12-04 NOTE — Patient Instructions (Signed)
Diabetes and Foot Care Diabetes may cause you to have problems because of poor blood supply (circulation) to your feet and legs. This may cause the skin on your feet to become thinner, break easier, and heal more slowly. Your skin may become dry, and the skin may peel and crack. You may also have nerve damage in your legs and feet causing decreased feeling in them. You may not notice minor injuries to your feet that could lead to infections or more serious problems. Taking care of your feet is one of the most important things you can do for yourself.  HOME CARE INSTRUCTIONS  Wear shoes at all times, even in the house. Do not go barefoot. Bare feet are easily injured.  Check your feet daily for blisters, cuts, and redness. If you cannot see the bottom of your feet, use a mirror or ask someone for help.  Wash your feet with warm water (do not use hot water) and mild soap. Then pat your feet and the areas between your toes until they are completely dry. Do not soak your feet as this can dry your skin.  Apply a moisturizing lotion or petroleum jelly (that does not contain alcohol and is unscented) to the skin on your feet and to dry, brittle toenails. Do not apply lotion between your toes.  Trim your toenails straight across. Do not dig under them or around the cuticle. File the edges of your nails with an emery board or nail file.  Do not cut corns or calluses or try to remove them with medicine.  Wear clean socks or stockings every day. Make sure they are not too tight. Do not wear knee-high stockings since they may decrease blood flow to your legs.  Wear shoes that fit properly and have enough cushioning. To break in new shoes, wear them for just a few hours a day. This prevents you from injuring your feet. Always look in your shoes before you put them on to be sure there are no objects inside.  Do not cross your legs. This may decrease the blood flow to your feet.  If you find a minor scrape,  cut, or break in the skin on your feet, keep it and the skin around it clean and dry. These areas may be cleansed with mild soap and water. Do not cleanse the area with peroxide, alcohol, or iodine.  When you remove an adhesive bandage, be sure not to damage the skin around it.  If you have a wound, look at it several times a day to make sure it is healing.  Do not use heating pads or hot water bottles. They may burn your skin. If you have lost feeling in your feet or legs, you may not know it is happening until it is too late.  Make sure your health care provider performs a complete foot exam at least annually or more often if you have foot problems. Report any cuts, sores, or bruises to your health care provider immediately. SEEK MEDICAL CARE IF:   You have an injury that is not healing.  You have cuts or breaks in the skin.  You have an ingrown nail.  You notice redness on your legs or feet.  You feel burning or tingling in your legs or feet.  You have pain or cramps in your legs and feet.  Your legs or feet are numb.  Your feet always feel cold. SEEK IMMEDIATE MEDICAL CARE IF:   There is increasing redness,   swelling, or pain in or around a wound.  There is a red line that goes up your leg.  Pus is coming from a wound.  You develop a fever or as directed by your health care provider.  You notice a bad smell coming from an ulcer or wound.   This information is not intended to replace advice given to you by your health care provider. Make sure you discuss any questions you have with your health care provider.   Document Released: 06/27/2000 Document Revised: 03/02/2013 Document Reviewed: 12/07/2012 Elsevier Interactive Patient Education 2016 [employer]  

## 2015-12-24 ENCOUNTER — Other Ambulatory Visit: Payer: Self-pay | Admitting: Nurse Practitioner

## 2015-12-25 ENCOUNTER — Other Ambulatory Visit: Payer: Self-pay | Admitting: *Deleted

## 2015-12-25 MED ORDER — METOPROLOL TARTRATE 25 MG PO TABS: ORAL_TABLET | ORAL | Status: DC

## 2015-12-25 MED ORDER — SIMVASTATIN 10 MG PO TABS: ORAL_TABLET | ORAL | Status: DC

## 2015-12-25 MED ORDER — LISINOPRIL 20 MG PO TABS: ORAL_TABLET | ORAL | Status: DC

## 2015-12-25 MED ORDER — FUROSEMIDE 20 MG PO TABS: ORAL_TABLET | ORAL | Status: DC

## 2015-12-25 MED ORDER — METFORMIN HCL 500 MG PO TABS: ORAL_TABLET | ORAL | Status: DC

## 2015-12-25 NOTE — Telephone Encounter (Signed)
[facility]  

## 2016-01-01 DIAGNOSIS — M170 Bilateral primary osteoarthritis of knee: Secondary | ICD-10-CM | POA: Diagnosis not present

## 2016-01-01 DIAGNOSIS — M1712 Unilateral primary osteoarthritis, left knee: Secondary | ICD-10-CM | POA: Diagnosis not present

## 2016-01-01 DIAGNOSIS — M1711 Unilateral primary osteoarthritis, right knee: Secondary | ICD-10-CM | POA: Diagnosis not present

## 2016-01-22 ENCOUNTER — Other Ambulatory Visit: Payer: Self-pay | Admitting: *Deleted

## 2016-01-22 DIAGNOSIS — I10 Essential (primary) hypertension: Secondary | ICD-10-CM

## 2016-01-22 DIAGNOSIS — E1122 Type 2 diabetes mellitus with diabetic chronic kidney disease: Secondary | ICD-10-CM

## 2016-01-22 DIAGNOSIS — N183 Chronic kidney disease, stage 3 (moderate): Secondary | ICD-10-CM

## 2016-01-22 DIAGNOSIS — I482 Chronic atrial fibrillation, unspecified: Secondary | ICD-10-CM

## 2016-01-22 DIAGNOSIS — E785 Hyperlipidemia, unspecified: Secondary | ICD-10-CM

## 2016-01-28 ENCOUNTER — Other Ambulatory Visit: Payer: Medicare Other

## 2016-01-28 DIAGNOSIS — N183 Chronic kidney disease, stage 3 unspecified: Secondary | ICD-10-CM

## 2016-01-28 DIAGNOSIS — E1122 Type 2 diabetes mellitus with diabetic chronic kidney disease: Secondary | ICD-10-CM

## 2016-01-28 DIAGNOSIS — I10 Essential (primary) hypertension: Secondary | ICD-10-CM

## 2016-01-28 DIAGNOSIS — E785 Hyperlipidemia, unspecified: Secondary | ICD-10-CM

## 2016-01-28 DIAGNOSIS — I482 Chronic atrial fibrillation, unspecified: Secondary | ICD-10-CM

## 2016-01-29 LAB — COMPLETE METABOLIC PANEL WITH GFR
ALT: 11 U/L (ref 9–46)
AST: 13 U/L (ref 10–35)
Albumin: 3.7 g/dL (ref 3.6–5.1)
Alkaline Phosphatase: 73 U/L (ref 40–115)
BUN: 24 mg/dL (ref 7–25)
CALCIUM: 9.3 mg/dL (ref 8.6–10.3)
CHLORIDE: 104 mmol/L (ref 98–110)
CO2: 27 mmol/L (ref 20–31)
CREATININE: 0.86 mg/dL (ref 0.70–1.11)
GFR, Est African American: 86 mL/min (ref >=60)
GFR, Est Non African American: 75 mL/min (ref >=60)
Glucose, Bld: 122 mg/dL — ABNORMAL HIGH (ref 65–99)
POTASSIUM: 4.4 mmol/L (ref 3.5–5.3)
Sodium: 140 mmol/L (ref 135–146)
Total Bilirubin: 0.8 mg/dL (ref 0.2–1.2)
Total Protein: 5.8 g/dL — ABNORMAL LOW (ref 6.1–8.1)

## 2016-01-29 LAB — HEMOGLOBIN A1C
HEMOGLOBIN A1C: 7.2 % — AB (ref <5.7)
Mean Plasma Glucose: 160 mg/dL

## 2016-01-29 LAB — CBC WITH DIFFERENTIAL/PLATELET
BASOS PCT: 0 %
Basophils Absolute: 0 cells/uL (ref 0–200)
EOS ABS: 110 {cells}/uL (ref 15–500)
Eosinophils Relative: 2 %
HEMATOCRIT: 38.5 % (ref 38.5–50.0)
HEMOGLOBIN: 12 g/dL — AB (ref 13.2–17.1)
LYMPHS ABS: 1045 {cells}/uL (ref 850–3900)
LYMPHS PCT: 19 %
MCH: 25.6 pg — ABNORMAL LOW (ref 27.0–33.0)
MCHC: 31.2 g/dL — ABNORMAL LOW (ref 32.0–36.0)
MCV: 82.1 fL (ref 80.0–100.0)
MONO ABS: 495 {cells}/uL (ref 200–950)
MPV: 10.3 fL (ref 7.5–12.5)
Monocytes Relative: 9 %
NEUTROS PCT: 70 %
Neutro Abs: 3850 cells/uL (ref 1500–7800)
Platelets: 239 10*3/uL (ref 140–400)
RBC: 4.69 MIL/uL (ref 4.20–5.80)
RDW: 16.7 % — ABNORMAL HIGH (ref 11.0–15.0)
WBC: 5.5 10*3/uL (ref 3.8–10.8)

## 2016-01-29 LAB — LIPID PANEL
Cholesterol: 142 mg/dL (ref 125–200)
HDL: 48 mg/dL (ref >=40)
LDL CALC: 72 mg/dL (ref <130)
TRIGLYCERIDES: 111 mg/dL (ref <150)
Total CHOL/HDL Ratio: 3 Ratio (ref <=5.0)
VLDL: 22 mg/dL (ref <30)

## 2016-01-30 DIAGNOSIS — M1712 Unilateral primary osteoarthritis, left knee: Secondary | ICD-10-CM | POA: Diagnosis not present

## 2016-01-30 DIAGNOSIS — M1711 Unilateral primary osteoarthritis, right knee: Secondary | ICD-10-CM | POA: Diagnosis not present

## 2016-01-30 DIAGNOSIS — M170 Bilateral primary osteoarthritis of knee: Secondary | ICD-10-CM | POA: Diagnosis not present

## 2016-01-31 ENCOUNTER — Encounter: Payer: Medicare Other | Admitting: Nurse Practitioner

## 2016-02-09 DIAGNOSIS — M1711 Unilateral primary osteoarthritis, right knee: Secondary | ICD-10-CM | POA: Diagnosis not present

## 2016-02-14 ENCOUNTER — Ambulatory Visit (INDEPENDENT_AMBULATORY_CARE_PROVIDER_SITE_OTHER): Payer: Medicare Other | Admitting: Nurse Practitioner

## 2016-02-14 ENCOUNTER — Encounter: Payer: Self-pay | Admitting: Nurse Practitioner

## 2016-02-14 VITALS — BP 118/74 | HR 82 | Temp 97.8°F | Resp 18 | Ht 65.0 in | Wt 144.8 lb

## 2016-02-14 DIAGNOSIS — E785 Hyperlipidemia, unspecified: Secondary | ICD-10-CM

## 2016-02-14 DIAGNOSIS — E1122 Type 2 diabetes mellitus with diabetic chronic kidney disease: Secondary | ICD-10-CM

## 2016-02-14 DIAGNOSIS — N183 Chronic kidney disease, stage 3 unspecified: Secondary | ICD-10-CM

## 2016-02-14 DIAGNOSIS — I482 Chronic atrial fibrillation, unspecified: Secondary | ICD-10-CM

## 2016-02-14 DIAGNOSIS — R634 Abnormal weight loss: Secondary | ICD-10-CM | POA: Diagnosis not present

## 2016-02-14 DIAGNOSIS — D649 Anemia, unspecified: Secondary | ICD-10-CM

## 2016-02-14 DIAGNOSIS — I10 Essential (primary) hypertension: Secondary | ICD-10-CM

## 2016-02-14 DIAGNOSIS — E0590 Thyrotoxicosis, unspecified without thyrotoxic crisis or storm: Secondary | ICD-10-CM

## 2016-02-14 LAB — TSH: TSH: 0.32 mIU/L — ABNORMAL LOW (ref 0.40–4.50)

## 2016-02-14 LAB — IRON AND TIBC
%SAT: 6 % — AB (ref 15–60)
Iron: 24 ug/dL — ABNORMAL LOW (ref 50–180)
TIBC: 382 ug/dL (ref 250–425)
UIBC: 358 ug/dL (ref 125–400)

## 2016-02-14 LAB — FERRITIN: FERRITIN: 38 ng/mL (ref 20–380)

## 2016-02-14 NOTE — Patient Instructions (Signed)
To take glucerna for additional nutritional support daily  To bring in glucometer for [place] to look at if you can not figure it out- [place], pharm D is here on Monday mornings.

## 2016-02-14 NOTE — Progress Notes (Signed)
  PCP: EUBANKS, JESSICA K, NP  Advanced Directive information Does patient have an advance directive?: Yes, Type of Advance Directive: Healthcare Power of Attorney, Does patient want to make changes to advanced directive?: No - Patient declined  Allergies  Allergen Reactions  . Morphine And Related Nausea And Vomiting  . Oxycodone Nausea And Vomiting    Chief Complaint  Patient presents with  . Medical Management of Chronic Issues    Patient does not want CPE. Passed clock drawing.     HPI: Patient is a 82 y.o. male seen in the office today for routine follow up. Pt with a hx of htn, CHF, afib on chronic anticoagulation, GERD, DMII with CKD, multinodular goiter with "subclinical hyperthyroidism", chronic venous insufficiency with stasis dermatitis, hyperlipidemia  Pt does not have time for physical today.   Had lost 6 lb at visit in April, has lost 1 more lb since then but weight has been stable May. Taking supplement in the morning- using protein   Was seen for venous ulcer of ankle but this has resolved, wearing padded dressing. Wearing 2 different shoes today- reports one is an emergency shoe due to an open wound that has healed on right ankle.  Feet cont to swell at times. Taking lasix as needed but needing most day- taking 1/2 tablet  Blood sugars running around 130s at the highest- having trouble with new machine. Can not get the sample to the machine before it gives him an error reading.   No falls since fall in Oct of 2016.   Anemia- noted drop in hgb since Oct 2016- no dark tarry stools, no bleeding noted.  MMSE was done - which is unchanged MMSE - Mini Mental State Exam 02/14/2016 05/16/2014 05/16/2014  Orientation to time 4 5 5  Orientation to Place 5 5 5  Registration 3 3 3  Attention/ Calculation 5 5 5  Recall 1 0 0  Language- name 2 objects 2 2 2  Language- repeat 1 1 1  Language- follow 3 step command 3 3 3  Language- read & follow direction 1 1 1  Write a  sentence 1 1 1  Copy design 1 1 1  Total score 27 27 27      Review of Systems:  Review of Systems  Constitutional: Positive for unexpected weight change.  HENT: Positive for hearing loss (Mild).   Respiratory: Negative for shortness of breath.   Cardiovascular: Positive for leg swelling (1+ in the right leg. Venous insufficiency.). Negative for chest pain.       Peripheral vascular insufficiency.chronic atrial fibrillation.  Gastrointestinal: Negative for abdominal pain.  Endocrine:       Diabetes mellitus  Genitourinary: Negative for dysuria.  Musculoskeletal: Positive for gait problem (Using walker with front wheels and rear skids).  Skin:       Stasis dermatitis; right medial malleolus tender  Neurological: Negative for dizziness and weakness.    Past Medical History:  Diagnosis Date  . Arthritis    left knee  . Diabetes mellitus    diet controlled, pt does not check cbg  . Diaphragmatic hernia without mention of obstruction or gangrene   . Dizziness and giddiness   . Edema   . H/O hiatal hernia   . Hypercholesteremia   . Hypertension   . Left rotator cuff tear Jul 12, 2012  . Stomach ulcer    years  . Unspecified hereditary and idiopathic peripheral neuropathy   . Unspecified vitamin D deficiency   .   Varicose veins    both legs   Past Surgical History:  Procedure Laterality Date  . CARDIOVERSION N/A 02/22/2013   Procedure: CARDIOVERSION;  Surgeon: Darlin Coco, MD;  Location: [facility] ENDOSCOPY;  Service: Cardiovascular;  Laterality: N/A;  . Esopagus strectching  2003 and 3 yrs ago  . MASTECTOMY     bilateral  . ORIF ANKLE FRACTURE Right 09/20/2012   Procedure: OPEN REDUCTION INTERNAL FIXATION (ORIF) ANKLE FRACTURE;  Surgeon: Johnn Hai, MD;  Location: [facility];  Service: Orthopedics;  Laterality: Right;  . REVERSE SHOULDER ARTHROPLASTY Right 09/17/2012   Procedure: REVERSE SHOULDER ARTHROPLASTY;  Surgeon: Augustin Schooling, MD;  Location: [place];  Service:  Orthopedics;  Laterality: Right;  . SHOULDER OPEN ROTATOR CUFF REPAIR  11/05/2011   Procedure: ROTATOR CUFF REPAIR SHOULDER OPEN;  Surgeon: Tobi Bastos, MD;  Location: [facility];  Service: Orthopedics;  Laterality: Left;  Left Shoulder Rotator Cuff Repair Open with Graft and Anchors  . TONSILLECTOMY  age 78   Social History:   reports that he quit smoking about 19 years ago. His smoking use included Pipe and Cigarettes. He quit after 50.00 years of use. He has never used smokeless tobacco. He reports that he drinks alcohol. He reports that he does not use drugs.  Family History  Problem Relation Age of Onset  . Parkinson's disease Mother   . Hip fracture Mother   . Heart disease Mother   . Alzheimer's disease Mother   . Heart disease Father   . Parkinson's disease Sister   . Heart disease Brother     Medications: Patient's Medications  New Prescriptions   No medications on file  Previous Medications   ACETAMINOPHEN (TYLENOL) 325 MG TABLET    Take 2 tablets (650 mg total) by mouth every 6 (six) hours as needed for mild pain (or Fever >/= 101).   CALCIUM CARBONATE-VITAMIN D 600-200 MG-UNIT TABS    Take 1 tablet by mouth 2 (two) times daily. Take 1 tablet daily for vitamin supplement.   ELIQUIS 5 MG TABS TABLET    TAKE 1 TABLET TWICE DAILY.   FUROSEMIDE (LASIX) 20 MG TABLET    Take 1/2 tablet by mouth once daily for edema,hypertension   GLUCOSAMINE-CHONDROIT-VIT C-MN (GLUCOSAMINE 1500 COMPLEX) CAPS    Take one tablet once a day   GLUCOSE BLOOD (TRUETEST TEST) TEST STRIP    E11.22 check blood sugar once daily   GLUCOSE MONITORING KIT (FREESTYLE) MONITORING KIT    1 each by Does not apply route as needed for other. Take blood sugar once daily.   LISINOPRIL (PRINIVIL,ZESTRIL) 20 MG TABLET    Take one tablet by mouth once daily for blood pressure   METFORMIN (GLUCOPHAGE) 500 MG TABLET    Take one tablet by mouth twice daily to control blood sugar   METOPROLOL TARTRATE (LOPRESSOR) 25 MG  TABLET    Take one tablet by mouth twice daily   MULTIPLE VITAMIN (MULITIVITAMIN WITH MINERALS) TABS    Take 1 tablet by mouth daily.    OMEGA-3 FATTY ACIDS (FISH OIL) 1000 MG CAPS    Take 1,000 mg by mouth daily.    SIMVASTATIN (ZOCOR) 10 MG TABLET    Take one tablet by mouth once daily at bedtime   TRUEPLUS LANCETS 30G MISC    Use to check blood sugar once daily. Dx E11.9  Modified Medications   No medications on file  Discontinued Medications   No medications on file     Physical  Exam:  Vitals:   02/14/16 1037  BP: 118/74  Pulse: 82  Resp: 18  Temp: 97.8 F (36.6 C)  TempSrc: Oral  SpO2: 97%  Weight: 144 lb 12.8 oz (65.7 kg)  Height: 5' 5" (1.651 m)   Body mass index is 24.1 kg/m.  Physical Exam  Constitutional: He is oriented to person, place, and time. He appears well-nourished. No distress.  Frail elderly man  HENT:  Head: Normocephalic and atraumatic.  Eyes: Conjunctivae and EOM are normal. Pupils are equal, round, and reactive to light.  Neck: Normal range of motion. Neck supple.  Cardiovascular: Normal rate and normal heart sounds.  An irregularly irregular rhythm present.  Atrial fibrillation Diminished DP and PT bilaterally  Pulmonary/Chest: Effort normal and breath sounds normal.  Abdominal: Soft. Bowel sounds are normal.  Musculoskeletal: He exhibits edema and tenderness (Both knees).  Walker with front wheels and rear skids. Unstable gait.   Neurological: He is alert and oriented to person, place, and time.  Skin: Skin is warm and dry. He is not diaphoretic.  Vascular blush across the dorsum of the right foot. Small area of increased erythema at a bone spur on the right foot medially.  Psychiatric: He has a normal mood and affect.    Labs reviewed: Basic Metabolic Panel:  Recent Labs  04/19/15 1047  04/27/15 0232 05/29/15 1110 07/27/15 0952 01/28/16 1400  NA 142  < > 129* 140 143 140  K 4.6  < > 4.6 5.0 4.7 4.4  CL 103  < > 94* 102 104 104    CO2 23  < > 24 22 23 27  GLUCOSE 146*  < > 302* 143* 129* 122*  BUN 26  < > 38* 28 29 24  CREATININE 1.05  < > 1.17 1.15 0.95 0.86  CALCIUM 9.7  < > 9.1 9.4 9.7 9.3  TSH 0.530  --  0.156*  --   --   --   < > = values in this interval not displayed. Liver Function Tests:  Recent Labs  07/27/15 0952 01/28/16 1400  AST 18 13  ALT 17 11  ALKPHOS 95 73  BILITOT 0.7 0.8  PROT 5.8* 5.8*  ALBUMIN 3.9 3.7   No results for input(s): LIPASE, AMYLASE in the last 8760 hours. No results for input(s): AMMONIA in the last 8760 hours. CBC:  Recent Labs  04/26/15 2020  04/28/15 0530 04/29/15 0612 07/27/15 0952 01/28/16 1400  WBC 15.0*  < > 9.1 7.8 8.5 5.5  NEUTROABS 13.5*  --   --   --  6.3 3,850  HGB 13.9  < > 12.0* 11.5*  --  12.0*  HCT 42.4  < > 37.3* 36.9* 39.2 38.5  MCV 83.5  < > 84.2 84.6 80 82.1  PLT 191  < > 161 149* 235 239  < > = values in this interval not displayed. Lipid Panel:  Recent Labs  01/28/16 1400  CHOL 142  HDL 48  LDLCALC 72  TRIG 111  CHOLHDL 3.0   TSH:  Recent Labs  04/19/15 1047 04/27/15 0232  TSH 0.530 0.156*   A1C: Lab Results  Component Value Date   HGBA1C 7.2 (H) 01/28/2016     Assessment/Plan .1. Anemia, unspecified anemia type -hgb down after fall in 2016, hemoccult negative, will follow up iron studies at this time.  - Iron and TIBC - Ferritin  2. Hyperthyroidism, subclinical -will follow up TSH  3. Loss of weight Weight has been stable over   the last 3 months, encourage protein intake. Encourage use of supplements and increase protein in diet.   4. Essential hypertension Blood pressure stable, cont on lisinopril, lopressor, and lasix  5. Chronic atrial fibrillation (HCC) -rate controlled on current regimen, conts on eliquis for anticoagulation   6. Type 2 diabetes mellitus with stage 3 chronic kidney disease, without long-term current use of insulin (HCC) A1c at goal. conts on metformin 500 mg daily   7. CKD  (chronic kidney disease) stage 3, GFR 30-59 ml/min BUN and Cr stable, encourage hydration and avoid nephrotoxic medication   8. Hyperlipidemia LDL at 72, conts on zocor.   Follow up in 3 months for physical   Quanita Barona K. Harle Battiest  [facility] [phone] 8 am - 5 pm) [phone] (after hours)

## 2016-02-18 ENCOUNTER — Encounter: Payer: Medicare Other | Admitting: Nurse Practitioner

## 2016-02-21 DIAGNOSIS — M1711 Unilateral primary osteoarthritis, right knee: Secondary | ICD-10-CM | POA: Diagnosis not present

## 2016-03-04 DIAGNOSIS — M170 Bilateral primary osteoarthritis of knee: Secondary | ICD-10-CM | POA: Diagnosis not present

## 2016-03-04 DIAGNOSIS — M1711 Unilateral primary osteoarthritis, right knee: Secondary | ICD-10-CM | POA: Diagnosis not present

## 2016-03-04 DIAGNOSIS — M1712 Unilateral primary osteoarthritis, left knee: Secondary | ICD-10-CM | POA: Diagnosis not present

## 2016-03-05 ENCOUNTER — Ambulatory Visit: Payer: Medicare Other | Admitting: Podiatry

## 2016-03-06 NOTE — Progress Notes (Signed)
HPI The patient presents for evaluation of atrial fibrillation and volume overload.  I last saw him in 2015.  He saw Kerin Ransom in 2016.  He was doing OK with asymptomatic atrial fib.   I do note that since that visit he has had falls and was in the hospital. However, the decision was made that time his input to continue his anticoagulation. He returns for follow-up.  Since I last saw him he has done well other than the falls.  The patient denies any new symptoms such as chest discomfort, neck or arm discomfort. There has been no new shortness of breath, PND or orthopnea. There have been no reported palpitations, presyncope or syncope.  He says that he is very careful not to fall and does this rarely.    Allergies  Allergen Reactions  . Morphine And Related Nausea And Vomiting  . Oxycodone Nausea And Vomiting    Current Outpatient Prescriptions  Medication Sig Dispense Refill  . acetaminophen (TYLENOL) 325 MG tablet Take 2 tablets (650 mg total) by mouth every 6 (six) hours as needed for mild pain (or Fever >/= 101).    . Calcium Carbonate-Vitamin D 600-200 MG-UNIT TABS Take 1 tablet by mouth 2 (two) times daily. Take 1 tablet daily for vitamin supplement.    Marland Kitchen ELIQUIS 5 MG TABS tablet TAKE 1 TABLET TWICE DAILY. 60 tablet 2  . ferrous sulfate 325 (65 FE) MG EC tablet Take 325 mg by mouth 2 (two) times daily with a meal.    . furosemide (LASIX) 20 MG tablet Take 1/2 tablet by mouth once daily for edema,hypertension 45 tablet 1  . Glucosamine-Chondroit-Vit C-Mn (GLUCOSAMINE 1500 COMPLEX) CAPS Take one tablet once a day 30 each 0  . glucose blood (TRUETEST TEST) test strip E11.22 check blood sugar once daily 50 each PRN  . glucose monitoring kit (FREESTYLE) monitoring kit 1 each by Does not apply route as needed for other. Take blood sugar once daily. 1 each 0  . lisinopril (PRINIVIL,ZESTRIL) 20 MG tablet Take one tablet by mouth once daily for blood pressure 90 tablet 1  . metFORMIN  (GLUCOPHAGE) 500 MG tablet Take one tablet by mouth twice daily to control blood sugar 180 tablet 1  . metoprolol tartrate (LOPRESSOR) 25 MG tablet Take one tablet by mouth twice daily 180 tablet 1  . Multiple Vitamin (MULITIVITAMIN WITH MINERALS) TABS Take 1 tablet by mouth daily.     . Omega-3 Fatty Acids (FISH OIL) 1000 MG CAPS Take 1,000 mg by mouth daily.     . simvastatin (ZOCOR) 10 MG tablet Take one tablet by mouth once daily at bedtime 90 tablet 1  . TRUEPLUS LANCETS 30G MISC Use to check blood sugar once daily. Dx E11.9 50 each 12   No current facility-administered medications for this visit.     Past Medical History:  Diagnosis Date  . Arthritis    left knee  . Diabetes mellitus    diet controlled, pt does not check cbg  . Diaphragmatic hernia without mention of obstruction or gangrene   . Dizziness and giddiness   . Edema   . H/O hiatal hernia   . Hypercholesteremia   . Hypertension   . Left rotator cuff tear Jul 12, 2012  . Stomach ulcer    years  . Unspecified hereditary and idiopathic peripheral neuropathy   . Unspecified vitamin D deficiency   . Varicose veins    both legs    Past Surgical History:  Procedure Laterality Date  . CARDIOVERSION N/A 02/22/2013   Procedure: CARDIOVERSION;  Surgeon: Darlin Coco, MD;  Location: [facility] ENDOSCOPY;  Service: Cardiovascular;  Laterality: N/A;  . Esopagus strectching  2003 and 3 yrs ago  . MASTECTOMY     bilateral  . ORIF ANKLE FRACTURE Right 09/20/2012   Procedure: OPEN REDUCTION INTERNAL FIXATION (ORIF) ANKLE FRACTURE;  Surgeon: Johnn Hai, MD;  Location: [facility];  Service: Orthopedics;  Laterality: Right;  . REVERSE SHOULDER ARTHROPLASTY Right 09/17/2012   Procedure: REVERSE SHOULDER ARTHROPLASTY;  Surgeon: Augustin Schooling, MD;  Location: [place];  Service: Orthopedics;  Laterality: Right;  . SHOULDER OPEN ROTATOR CUFF REPAIR  11/05/2011   Procedure: ROTATOR CUFF REPAIR SHOULDER OPEN;  Surgeon: Tobi Bastos, MD;   Location: [facility];  Service: Orthopedics;  Laterality: Left;  Left Shoulder Rotator Cuff Repair Open with Graft and Anchors  . TONSILLECTOMY  age 24    ROS:   Otherwise as stated in the HPI and negative for all other systems.  PHYSICAL EXAM BP 130/60   Pulse 83   Ht _0  (1.676 m)   Wt 143 lb (64.9 kg)   BMI 23.08 kg/m  GENERAL:  Frail appearing HEENT:  Pupils equal round and reactive, fundi not visualized, oral mucosa unremarkable NECK: No JVD LYMPHATICS:  No cervical, inguinal adenopathy LUNGS:  Clear to auscultation bilaterally BACK:  No CVA tenderness CHEST:  Bilateral mastectomy scars HEART:  PMI not displaced or sustained,S1 and S2 within normal limits, no S3,  no clicks, no rubs, no murmurs, irregular ABD:  Flat, positive bowel sounds normal in frequency in pitch, no bruits, no rebound, no guarding, no midline pulsatile mass, no hepatomegaly, no splenomegaly EXT:  2 plus pulses throughout, mild ankle edema, no cyanosis no clubbing SKIN:  No rashes no nodules NEURO:  Cranial nerves II through XII grossly intact, motor grossly intact throughout PSYCH:  Cognitively intact, oriented to person place and time  EKG:  Atrial fibrillation, rate 83, axis within normal limits, intervals within normal limits, no acute ST-T wave.  ASSESSMENT AND PLAN   CHF:  He seems to be euvolemic.  At this point, no change in therapy is indicated.  We have reviewed salt and fluid restrictions.  No further cardiovascular testing is indicated.  ATRIAL FIBRILLATION:   Mr. Logan Bean has a CHA2DS2 - VASc score of 4 with a risk of stroke of 4%  and a HAS - BLED score of 2 with a low risk of bleeding. He is tolerating his blood thinners. He will continue with rate control and anticoagulation.  We had a long discussion about the risks benefits of anticoagulation and I agree that he should continue it at this point.

## 2016-03-07 ENCOUNTER — Encounter: Payer: Self-pay | Admitting: Cardiology

## 2016-03-07 ENCOUNTER — Ambulatory Visit (INDEPENDENT_AMBULATORY_CARE_PROVIDER_SITE_OTHER): Payer: Medicare Other | Admitting: Cardiology

## 2016-03-07 VITALS — BP 130/60 | HR 83 | Ht 66.0 in | Wt 143.0 lb

## 2016-03-07 DIAGNOSIS — I4891 Unspecified atrial fibrillation: Secondary | ICD-10-CM | POA: Diagnosis not present

## 2016-03-07 DIAGNOSIS — I10 Essential (primary) hypertension: Secondary | ICD-10-CM | POA: Diagnosis not present

## 2016-03-07 NOTE — Patient Instructions (Signed)
Your physician wants you to follow-up in: 1 Year. You will receive a reminder letter in the mail two months in advance. If you don't receive a letter, please call our office to schedule the follow-up appointment.  

## 2016-03-11 DIAGNOSIS — M1711 Unilateral primary osteoarthritis, right knee: Secondary | ICD-10-CM | POA: Diagnosis not present

## 2016-03-18 DIAGNOSIS — M1711 Unilateral primary osteoarthritis, right knee: Secondary | ICD-10-CM | POA: Diagnosis not present

## 2016-03-20 ENCOUNTER — Other Ambulatory Visit: Payer: Self-pay | Admitting: Nurse Practitioner

## 2016-03-25 ENCOUNTER — Ambulatory Visit: Payer: Medicare Other | Admitting: Podiatry

## 2016-04-02 DIAGNOSIS — M170 Bilateral primary osteoarthritis of knee: Secondary | ICD-10-CM | POA: Diagnosis not present

## 2016-04-15 ENCOUNTER — Encounter: Payer: Self-pay | Admitting: Podiatry

## 2016-04-15 ENCOUNTER — Ambulatory Visit (INDEPENDENT_AMBULATORY_CARE_PROVIDER_SITE_OTHER): Payer: Medicare Other | Admitting: Podiatry

## 2016-04-15 DIAGNOSIS — M79676 Pain in unspecified toe(s): Secondary | ICD-10-CM

## 2016-04-15 DIAGNOSIS — B351 Tinea unguium: Secondary | ICD-10-CM | POA: Diagnosis not present

## 2016-04-15 NOTE — Progress Notes (Signed)
Patient ID: L…O Ridenhour, male   DOB: 1921-05-01, 82 y.o.   MRN: [id number]    Subjective: This patient presents today complaining of thickened and elongated toenails which are uncomfortable when walking wearing shoes and requests toenail debridement. Patient has been in rehabilitation since the visit of 02/07/2015 recovering from a fall  Objective: Orientated 3 Hammertoes 2-5 bilaterally Keratoses distal second left toe No open skin lesions bilaterally The toenails are extremely elongated, brittle, deformed, discolored and tender direct palpation 6-10  Assessment: Neglected symptomatic onychomycoses 6-10 Type II diabetic  Plan: Debridement toenails 6-10 mechanically and elected without any bleeding Debrided keratoses 1 without any bleeding  Reappoint 3 months

## 2016-04-15 NOTE — Patient Instructions (Signed)
Diabetes and Foot Care Diabetes may cause you to have problems because of poor blood supply (circulation) to your feet and legs. This may cause the skin on your feet to become thinner, break easier, and heal more slowly. Your skin may become dry, and the skin may peel and crack. You may also have nerve damage in your legs and feet causing decreased feeling in them. You may not notice minor injuries to your feet that could lead to infections or more serious problems. Taking care of your feet is one of the most important things you can do for yourself.  HOME CARE INSTRUCTIONS  Wear shoes at all times, even in the house. Do not go barefoot. Bare feet are easily injured.  Check your feet daily for blisters, cuts, and redness. If you cannot see the bottom of your feet, use a mirror or ask someone for help.  Wash your feet with warm water (do not use hot water) and mild soap. Then pat your feet and the areas between your toes until they are completely dry. Do not soak your feet as this can dry your skin.  Apply a moisturizing lotion or petroleum jelly (that does not contain alcohol and is unscented) to the skin on your feet and to dry, brittle toenails. Do not apply lotion between your toes.  Trim your toenails straight across. Do not dig under them or around the cuticle. File the edges of your nails with an emery board or nail file.  Do not cut corns or calluses or try to remove them with medicine.  Wear clean socks or stockings every day. Make sure they are not too tight. Do not wear knee-high stockings since they may decrease blood flow to your legs.  Wear shoes that fit properly and have enough cushioning. To break in new shoes, wear them for just a few hours a day. This prevents you from injuring your feet. Always look in your shoes before you put them on to be sure there are no objects inside.  Do not cross your legs. This may decrease the blood flow to your feet.  If you find a minor scrape,  cut, or break in the skin on your feet, keep it and the skin around it clean and dry. These areas may be cleansed with mild soap and water. Do not cleanse the area with peroxide, alcohol, or iodine.  When you remove an adhesive bandage, be sure not to damage the skin around it.  If you have a wound, look at it several times a day to make sure it is healing.  Do not use heating pads or hot water bottles. They may burn your skin. If you have lost feeling in your feet or legs, you may not know it is happening until it is too late.  Make sure your health care provider performs a complete foot exam at least annually or more often if you have foot problems. Report any cuts, sores, or bruises to your health care provider immediately. SEEK MEDICAL CARE IF:   You have an injury that is not healing.  You have cuts or breaks in the skin.  You have an ingrown nail.  You notice redness on your legs or feet.  You feel burning or tingling in your legs or feet.  You have pain or cramps in your legs and feet.  Your legs or feet are numb.  Your feet always feel cold. SEEK IMMEDIATE MEDICAL CARE IF:   There is increasing redness,   swelling, or pain in or around a wound.  There is a red line that goes up your leg.  Pus is coming from a wound.  You develop a fever or as directed by your health care provider.  You notice a bad smell coming from an ulcer or wound.   This information is not intended to replace advice given to you by your health care provider. Make sure you discuss any questions you have with your health care provider.   Document Released: 06/27/2000 Document Revised: 03/02/2013 Document Reviewed: 12/07/2012 Elsevier Interactive Patient Education 2016 [employer]  

## 2016-05-08 DIAGNOSIS — M170 Bilateral primary osteoarthritis of knee: Secondary | ICD-10-CM | POA: Diagnosis not present

## 2016-05-20 ENCOUNTER — Encounter: Payer: Self-pay | Admitting: Nurse Practitioner

## 2016-05-20 ENCOUNTER — Ambulatory Visit (INDEPENDENT_AMBULATORY_CARE_PROVIDER_SITE_OTHER): Payer: Medicare Other | Admitting: Nurse Practitioner

## 2016-05-20 ENCOUNTER — Ambulatory Visit (INDEPENDENT_AMBULATORY_CARE_PROVIDER_SITE_OTHER): Payer: Medicare Other

## 2016-05-20 VITALS — BP 118/76 | HR 85 | Temp 97.4°F | Ht 66.0 in | Wt 141.2 lb

## 2016-05-20 VITALS — BP 124/78 | HR 85 | Temp 97.4°F | Resp 18 | Ht 66.0 in | Wt 141.2 lb

## 2016-05-20 DIAGNOSIS — E0590 Thyrotoxicosis, unspecified without thyrotoxic crisis or storm: Secondary | ICD-10-CM | POA: Diagnosis not present

## 2016-05-20 DIAGNOSIS — I10 Essential (primary) hypertension: Secondary | ICD-10-CM | POA: Diagnosis not present

## 2016-05-20 DIAGNOSIS — E785 Hyperlipidemia, unspecified: Secondary | ICD-10-CM

## 2016-05-20 DIAGNOSIS — N183 Chronic kidney disease, stage 3 unspecified: Secondary | ICD-10-CM

## 2016-05-20 DIAGNOSIS — Z0000 Encounter for general adult medical examination without abnormal findings: Secondary | ICD-10-CM | POA: Diagnosis not present

## 2016-05-20 DIAGNOSIS — E1122 Type 2 diabetes mellitus with diabetic chronic kidney disease: Secondary | ICD-10-CM | POA: Diagnosis not present

## 2016-05-20 DIAGNOSIS — R634 Abnormal weight loss: Secondary | ICD-10-CM | POA: Diagnosis not present

## 2016-05-20 DIAGNOSIS — Z23 Encounter for immunization: Secondary | ICD-10-CM

## 2016-05-20 DIAGNOSIS — I482 Chronic atrial fibrillation, unspecified: Secondary | ICD-10-CM

## 2016-05-20 DIAGNOSIS — D508 Other iron deficiency anemias: Secondary | ICD-10-CM | POA: Diagnosis not present

## 2016-05-20 LAB — CBC WITH DIFFERENTIAL/PLATELET
BASOS PCT: 0 %
Basophils Absolute: 0 cells/uL (ref 0–200)
EOS PCT: 1 %
Eosinophils Absolute: 92 cells/uL (ref 15–500)
HCT: 40.9 % (ref 38.5–50.0)
Hemoglobin: 12.9 g/dL — ABNORMAL LOW (ref 13.2–17.1)
Lymphocytes Relative: 15 %
Lymphs Abs: 1380 cells/uL (ref 850–3900)
MCH: 25.5 pg — AB (ref 27.0–33.0)
MCHC: 31.5 g/dL — ABNORMAL LOW (ref 32.0–36.0)
MCV: 81 fL (ref 80.0–100.0)
MONOS PCT: 8 %
MPV: 10.6 fL (ref 7.5–12.5)
Monocytes Absolute: 736 cells/uL (ref 200–950)
NEUTROS ABS: 6992 {cells}/uL (ref 1500–7800)
Neutrophils Relative %: 76 %
PLATELETS: 243 10*3/uL (ref 140–400)
RBC: 5.05 MIL/uL (ref 4.20–5.80)
RDW: 17.2 % — ABNORMAL HIGH (ref 11.0–15.0)
WBC: 9.2 10*3/uL (ref 3.8–10.8)

## 2016-05-20 LAB — COMPLETE METABOLIC PANEL WITH GFR
ALT: 13 U/L (ref 9–46)
AST: 13 U/L (ref 10–35)
Albumin: 3.7 g/dL (ref 3.6–5.1)
Alkaline Phosphatase: 73 U/L (ref 40–115)
BUN: 37 mg/dL — AB (ref 7–25)
CHLORIDE: 105 mmol/L (ref 98–110)
CO2: 26 mmol/L (ref 20–31)
CREATININE: 1.04 mg/dL (ref 0.70–1.11)
Calcium: 10 mg/dL (ref 8.6–10.3)
GFR, Est African American: 71 mL/min (ref >=60)
GFR, Est Non African American: 62 mL/min (ref >=60)
GLUCOSE: 115 mg/dL — AB (ref 65–99)
Potassium: 4.8 mmol/L (ref 3.5–5.3)
Sodium: 140 mmol/L (ref 135–146)
Total Bilirubin: 0.8 mg/dL (ref 0.2–1.2)
Total Protein: 5.7 g/dL — ABNORMAL LOW (ref 6.1–8.1)

## 2016-05-20 LAB — IRON AND TIBC
%SAT: 7 % — ABNORMAL LOW (ref 15–60)
IRON: 29 ug/dL — AB (ref 50–180)
TIBC: 417 ug/dL (ref 250–425)
UIBC: 388 ug/dL (ref 125–400)

## 2016-05-20 LAB — TSH: TSH: 0.37 m[IU]/L — AB (ref 0.40–4.50)

## 2016-05-20 LAB — T4, FREE: Free T4: 1.4 ng/dL (ref 0.8–1.8)

## 2016-05-20 NOTE — Progress Notes (Signed)
Careteam: Patient Care Team: Lauree Chandler, NP as PCP - General (Nurse Practitioner)  Advanced Directive information Does patient have an advance directive?: Yes, Type of Advance Directive: Healthcare Power of Attorney  Allergies  Allergen Reactions  . Morphine And Related Nausea And Vomiting  . Oxycodone Nausea And Vomiting    Chief Complaint  Patient presents with  . Medical Management of Chronic Issues     HPI: Patient is a 82 y.o. male seen in the office today for AWV and routine follow up.  Pt with hx of htn, CHF, a fib with chronic anticoagulation, GERD, DM with CKD and multinodular goiter with "subclinical hyperthyroidism", chronic venous insufficiency with stasis dermatitis, hyperlipidemia.  Has lost weight- appetite is poor, does his own preparing meals. Has help 2 days a week.  No falls No depression or anxiety Saw cardiology in august, no changes were noted.  unable to take blood sugars with glucometer, does not get his blood to the machine in time before it times out.  No hypoglycemic episodes that he is aware of.  Pt is not taking iron    Review of Systems:  Review of Systems  HENT: Positive for hearing loss (Mild).   Respiratory: Negative for shortness of breath.   Cardiovascular: Positive for leg swelling (1+ in the right leg. Venous insufficiency.). Negative for chest pain.       Peripheral vascular insufficiency.chronic atrial fibrillation.  Gastrointestinal: Negative for abdominal pain.  Endocrine:       Diabetes mellitus  Genitourinary: Negative for dysuria.  Musculoskeletal: Positive for gait problem (Using walker with front wheels and rear skids).  Skin: Negative for wound.       Stasis dermatitis  Neurological: Negative for dizziness and weakness.  Psychiatric/Behavioral: Negative for agitation and behavioral problems.    Past Medical History:  Diagnosis Date  . Arthritis    left knee  . Diabetes mellitus    diet controlled, pt does  not check cbg  . Diaphragmatic hernia without mention of obstruction or gangrene   . Dizziness and giddiness   . Edema   . H/O hiatal hernia   . Hypercholesteremia   . Hypertension   . Left rotator cuff tear Jul 12, 2012  . Stomach ulcer    years  . Unspecified hereditary and idiopathic peripheral neuropathy   . Unspecified vitamin D deficiency   . Varicose veins    both legs   Past Surgical History:  Procedure Laterality Date  . CARDIOVERSION N/A 02/22/2013   Procedure: CARDIOVERSION;  Surgeon: Darlin Coco, MD;  Location: [facility];  Service: Cardiovascular;  Laterality: N/A;  . Esopagus strectching  2003 and 3 yrs ago  . MASTECTOMY     bilateral  . ORIF ANKLE FRACTURE Right 09/20/2012   Procedure: OPEN REDUCTION INTERNAL FIXATION (ORIF) ANKLE FRACTURE;  Surgeon: Johnn Hai, MD;  Location: [facility];  Service: Orthopedics;  Laterality: Right;  . REVERSE SHOULDER ARTHROPLASTY Right 09/17/2012   Procedure: REVERSE SHOULDER ARTHROPLASTY;  Surgeon: Augustin Schooling, MD;  Location: [facility];  Service: Orthopedics;  Laterality: Right;  . SHOULDER OPEN ROTATOR CUFF REPAIR  11/05/2011   Procedure: ROTATOR CUFF REPAIR SHOULDER OPEN;  Surgeon: Tobi Bastos, MD;  Location: [facility];  Service: Orthopedics;  Laterality: Left;  Left Shoulder Rotator Cuff Repair Open with Graft and Anchors  . TONSILLECTOMY  age 77   Social History:   reports that he quit smoking about 19 years ago. His smoking use included Pipe  and Cigarettes. He quit after 50.00 years of use. He has never used smokeless tobacco. He reports that he drinks alcohol. He reports that he does not use drugs.  Family History  Problem Relation Age of Onset  . Parkinson's disease Mother   . Hip fracture Mother   . Heart disease Mother   . Alzheimer's disease Mother   . Heart disease Father   . Parkinson's disease Sister   . Heart disease Brother     Medications: Patient's Medications  New Prescriptions   No medications on  file  Previous Medications   ACETAMINOPHEN (TYLENOL) 325 MG TABLET    Take 2 tablets (650 mg total) by mouth every 6 (six) hours as needed for mild pain (or Fever >/= 101).   CALCIUM CARBONATE-VITAMIN D 600-200 MG-UNIT TABS    Take 1 tablet by mouth 2 (two) times daily. Take 1 tablet daily for vitamin supplement.   ELIQUIS 5 MG TABS TABLET    TAKE 1 TABLET TWICE DAILY.   FERROUS SULFATE 325 (65 FE) MG EC TABLET    Take 325 mg by mouth 2 (two) times daily with a meal.   FUROSEMIDE (LASIX) 20 MG TABLET    Take 1/2 tablet by mouth once daily for edema,hypertension   GLUCOSAMINE-CHONDROIT-VIT C-MN (GLUCOSAMINE 1500 COMPLEX) CAPS    Take one tablet once a day   GLUCOSE BLOOD (TRUETEST TEST) TEST STRIP    E11.22 check blood sugar once daily   GLUCOSE MONITORING KIT (FREESTYLE) MONITORING KIT    1 each by Does not apply route as needed for other. Take blood sugar once daily.   LISINOPRIL (PRINIVIL,ZESTRIL) 20 MG TABLET    Take one tablet by mouth once daily for blood pressure   METFORMIN (GLUCOPHAGE) 500 MG TABLET    Take one tablet by mouth twice daily to control blood sugar   METOPROLOL TARTRATE (LOPRESSOR) 25 MG TABLET    Take one tablet by mouth twice daily   MULTIPLE VITAMIN (MULITIVITAMIN WITH MINERALS) TABS    Take 1 tablet by mouth daily.    OMEGA-3 FATTY ACIDS (FISH OIL) 1000 MG CAPS    Take 1,000 mg by mouth daily.    SIMVASTATIN (ZOCOR) 10 MG TABLET    Take one tablet by mouth once daily at bedtime   TRUEPLUS LANCETS 30G MISC    Use to check blood sugar once daily. Dx E11.9  Modified Medications   No medications on file  Discontinued Medications   No medications on file     Physical Exam:  Vitals:   05/20/16 1057  BP: 118/70  Pulse: 85  Resp: 18  Temp: 97.4 F (36.3 C)  TempSrc: Oral  SpO2: 98%  Weight: 141 lb 3.2 oz (64 kg)  Height: _0  (1.676 m)   Body mass index is 22.79 kg/m.  Physical Exam  Constitutional: He is oriented to person, place, and time. He appears  well-nourished. No distress.  Frail elderly man  HENT:  Head: Normocephalic and atraumatic.  Eyes: Conjunctivae and EOM are normal. Pupils are equal, round, and reactive to light.  Neck: Normal range of motion. Neck supple.  Cardiovascular: Normal rate and normal heart sounds.  An irregularly irregular rhythm present.  Pulmonary/Chest: Effort normal and breath sounds normal.  Abdominal: Soft. Bowel sounds are normal.  Musculoskeletal: He exhibits edema (trace bilaterally).  Walker with front wheels and rear skids. Unstable gait.   Neurological: He is alert and oriented to person, place, and time.  Skin: Skin is  warm and dry. He is not diaphoretic.  Psychiatric: He has a normal mood and affect.    Labs reviewed: Basic Metabolic Panel:  Recent Labs  05/29/15 1110 07/27/15 0952 01/28/16 1400 02/14/16 1142  NA 140 143 140  --   K 5.0 4.7 4.4  --   CL 102 104 104  --   CO2 _0 --   GLUCOSE 143* 129* 122*  --   BUN _1 --   CREATININE 1.15 0.95 0.86  --   CALCIUM 9.4 9.7 9.3  --   TSH  --   --   --  0.32*   Liver Function Tests:  Recent Labs  07/27/15 0952 01/28/16 1400  AST 18 13  ALT 17 11  ALKPHOS 95 73  BILITOT 0.7 0.8  PROT 5.8* 5.8*  ALBUMIN 3.9 3.7   No results for input(s): LIPASE, AMYLASE in the last 8760 hours. No results for input(s): AMMONIA in the last 8760 hours. CBC:  Recent Labs  07/27/15 0952 01/28/16 1400  WBC 8.5 5.5  NEUTROABS 6.3 3,850  HGB  --  12.0*  HCT 39.2 38.5  MCV 80 82.1  PLT 235 239   Lipid Panel:  Recent Labs  01/28/16 1400  CHOL 142  HDL 48  LDLCALC 72  TRIG 111  CHOLHDL 3.0   TSH:  Recent Labs  02/14/16 1142  TSH 0.32*   A1C: Lab Results  Component Value Date   HGBA1C 7.2 (H) 01/28/2016     Assessment/Plan 1. Hyperthyroidism, subclinical -will follow up blood work - TSH - T4, Free - T3  2. Loss of weight Weight conts to trend down, encouraged proper protein intake. To take ensures  between meals and not has meal replacement.   3. Essential hypertension -blood pressure stable, cont current regimen - COMPLETE METABOLIC PANEL WITH GFR  4. Chronic atrial fibrillation (HCC) Rate controll on metoprolol, conts eliquis for anticoagulation  - CBC with Differential/Platelets  5. CKD (chronic kidney disease) stage 3, GFR 30-59 ml/min Will follow up bmp, to stay hydrated and avoid NSAIDS  6. Type 2 diabetes mellitus with stage 3 chronic kidney disease, without long-term current use of insulin (HCC) -conts on metformin twice daily, will follow up A1c, conts on lisinopril.  - Hemoglobin A1c  7. Hyperlipidemia, unspecified hyperlipidemia type LDL stable in July, conts on zocor  8. Iron deficiency anemia secondary to inadequate dietary iron intake -encourage to get ferrous sulfate and take 325 mg BID with meals. - CBC with Differential/Platelets - Iron and TIBC  Kenwood Rosiak K. Harle Battiest  [facility] [phone] 8 am - 5 pm) [phone] (after hours)

## 2016-05-20 NOTE — Patient Instructions (Addendum)
Increase ensure supplement to twice daily  Increase protein in diet  We will follow up blood work today

## 2016-05-20 NOTE — Progress Notes (Signed)
Subjective:   Logan Bean is a 82 y.o. male who presents for an Initial Medicare Annual Wellness Visit.  Review of Systems      Cardiac Risk Factors include: advanced age (>17mn, >>73women);diabetes mellitus;male gender;hypertension;smoking/ tobacco exposure;family history of premature cardiovascular disease;sedentary lifestyle     Objective:    Today's Vitals   05/20/16 1055  BP: 118/76  Pulse: 85  Temp: 97.4 F (36.3 C)  TempSrc: Oral  SpO2: 98%  Weight: 141 lb 3.2 oz (64 kg)  Height: '5\' 6"'  (1.676 m)  PainSc: 0-No pain   Body mass index is 22.79 kg/m.   Current Medications (verified) Outpatient Encounter Prescriptions as of 05/20/2016  Medication Sig  . acetaminophen (TYLENOL) 325 MG tablet Take 2 tablets (650 mg total) by mouth every 6 (six) hours as needed for mild pain (or Fever >/= 101).  . Calcium Carbonate-Vitamin D 600-200 MG-UNIT TABS Take 1 tablet by mouth 2 (two) times daily. Take 1 tablet daily for vitamin supplement.  .Marland KitchenELIQUIS 5 MG TABS tablet TAKE 1 TABLET TWICE DAILY.  . ferrous sulfate 325 (65 FE) MG EC tablet Take 325 mg by mouth 2 (two) times daily with a meal.  . furosemide (LASIX) 20 MG tablet Take 1/2 tablet by mouth once daily for edema,hypertension  . Glucosamine-Chondroit-Vit C-Mn (GLUCOSAMINE 1500 COMPLEX) CAPS Take one tablet once a day  . glucose blood (TRUETEST TEST) test strip E11.22 check blood sugar once daily  . glucose monitoring kit (FREESTYLE) monitoring kit 1 each by Does not apply route as needed for other. Take blood sugar once daily.  .Marland Kitchenlisinopril (PRINIVIL,ZESTRIL) 20 MG tablet Take one tablet by mouth once daily for blood pressure  . metFORMIN (GLUCOPHAGE) 500 MG tablet Take one tablet by mouth twice daily to control blood sugar  . metoprolol tartrate (LOPRESSOR) 25 MG tablet Take one tablet by mouth twice daily  . Multiple Vitamin (MULITIVITAMIN WITH MINERALS) TABS Take 1 tablet by mouth daily.   . Omega-3 Fatty Acids  (FISH OIL) 1000 MG CAPS Take 1,000 mg by mouth daily.   . simvastatin (ZOCOR) 10 MG tablet Take one tablet by mouth once daily at bedtime  . TRUEPLUS LANCETS 30G MISC Use to check blood sugar once daily. Dx E11.9   No facility-administered encounter medications on file as of 05/20/2016.     Allergies (verified) Morphine and related and Oxycodone   History: Past Medical History:  Diagnosis Date  . Arthritis    left knee  . Diabetes mellitus    diet controlled, pt does not check cbg  . Diaphragmatic hernia without mention of obstruction or gangrene   . Dizziness and giddiness   . Edema   . H/O hiatal hernia   . Hypercholesteremia   . Hypertension   . Left rotator cuff tear Jul 12, 2012  . Stomach ulcer    years  . Unspecified hereditary and idiopathic peripheral neuropathy   . Unspecified vitamin D deficiency   . Varicose veins    both legs   Past Surgical History:  Procedure Laterality Date  . CARDIOVERSION N/A 02/22/2013   Procedure: CARDIOVERSION;  Surgeon: TDarlin Coco MD;  [facility];  Service: Cardiovascular;  Laterality: N/A;  . Esopagus strectching  2003 and 3 yrs ago  . MASTECTOMY     bilateral  . ORIF ANKLE FRACTURE Right 09/20/2012   Procedure: OPEN REDUCTION INTERNAL FIXATION (ORIF) ANKLE FRACTURE;  Surgeon: JJohnn Hai MD;  Location: [facility];  Service: Orthopedics;  Laterality: Right;  . REVERSE SHOULDER ARTHROPLASTY Right 09/17/2012   Procedure: REVERSE SHOULDER ARTHROPLASTY;  Surgeon: Augustin Schooling, MD;  Location: [place];  Service: Orthopedics;  Laterality: Right;  . SHOULDER OPEN ROTATOR CUFF REPAIR  11/05/2011   Procedure: ROTATOR CUFF REPAIR SHOULDER OPEN;  Surgeon: Tobi Bastos, MD;  Location: [facility];  Service: Orthopedics;  Laterality: Left;  Left Shoulder Rotator Cuff Repair Open with Graft and Anchors  . TONSILLECTOMY  age 54   Family History  Problem Relation Age of Onset  . Parkinson's disease Mother   . Hip fracture Mother     . Heart disease Mother   . Alzheimer's disease Mother   . Heart disease Father   . Parkinson's disease Sister   . Heart disease Brother    Social History   Occupational History  . Not on file.   Social History Main Topics  . Smoking status: Former Smoker    Years: 50.00    Types: Pipe, Cigarettes    Quit date: 07/14/1996  . Smokeless tobacco: Never Used  . Alcohol use Yes     Comment: occasional beer  . Drug use: No  . Sexual activity: No    Tobacco Counseling Counseling given: No   Activities of Daily Living In your present state of health, do you have any difficulty performing the following activities: 05/20/2016  Hearing? N  Vision? N  Difficulty concentrating or making decisions? Y  Walking or climbing stairs? Y  Dressing or bathing? N  Doing errands, shopping? Y  Preparing Food and eating ? N  Using the Toilet? N  In the past six months, have you accidently leaked urine? Y  Do you have problems with loss of bowel control? N  Managing your Medications? N  Managing your Finances? N  Housekeeping or managing your Housekeeping? Y  Some recent data might be hidden    Immunizations and Health Maintenance Immunization History  Administered Date(s) Administered  . Influenza Split 04/14/2011, 03/23/2012  . Influenza,inj,Quad PF,36+ Mos 05/25/2013, 05/16/2014, 04/24/2015, 05/20/2016  . Pneumococcal Conjugate-13 07/11/2014  . Pneumococcal Polysaccharide-23 07/14/2008  . Td 07/14/2010  . Tdap 07/13/2011   Health Maintenance Due  Topic Date Due  . ZOSTAVAX  09/11/1982    Patient Care Team: Lauree Chandler, NP as PCP - General (Nurse Practitioner)  Indicate any recent Medical Services you may have received from other than [facility] providers in the past year (date may be approximate).     Assessment:   This is a routine wellness examination for Logan Bean.   Hearing/Vision screen Hearing Screening Comments: Unsure of last hearing screen.  Vision Screening  Comments: Last eye exam done 04/22/16.   Dietary issues and exercise activities discussed: Current Exercise Habits: The patient does not participate in regular exercise at present, Exercise limited by: orthopedic condition(s)  Goals    . Increase water intake          Starting 05/20/16, I will attempt to increase my water to at least 5 glasses per day.       Depression Screen PHQ 2/9 Scores 05/20/2016 11/21/2015 12/21/2014 08/21/2014 05/16/2014 04/12/2014 03/02/2013  PHQ - 2 Score 0 0 0 0 0 1 0    Fall Risk Fall Risk  05/20/2016 02/14/2016 11/21/2015 11/02/2015 07/31/2015  Falls in the past year? Yes No Yes No No  Number falls in past yr: 2 or more - 1 - -  Injury with Fall? Yes - Yes - -  Risk Factor Category  High Fall Risk - - - -  Risk for fall due to : Impaired balance/gait;History of fall(s) - - - -  Follow up Falls prevention discussed - - - -    Cognitive Function: MMSE - Mini Mental State Exam 05/20/2016 02/14/2016 05/16/2014 05/16/2014  Orientation to time '5 4 5 5  ' Orientation to Place '4 5 5 5  ' Registration '3 3 3 3  ' Attention/ Calculation '3 5 5 5  ' Recall 1 1 0 0  Language- name 2 objects '2 2 2 2  ' Language- repeat '1 1 1 1  ' Language- follow 3 step command '3 3 3 3  ' Language- read & follow direction '1 1 1 1  ' Write a sentence '1 1 1 1  ' Copy design '1 1 1 1  ' Total score '25 27 27 27        ' Screening Tests Health Maintenance  Topic Date Due  . ZOSTAVAX  09/11/1982  . HEMOGLOBIN A1C  07/30/2016  . FOOT EXAM  11/20/2016  . OPHTHALMOLOGY EXAM  04/29/2017  . TETANUS/TDAP  07/12/2021  . INFLUENZA VACCINE  Completed  . PNA vac Low Risk Adult  Completed      Plan:    I have personally reviewed and addressed the Medicare Annual Wellness questionnaire and have noted the following in the patient's chart:  A. Medical and social history B. Use of alcohol, tobacco or illicit drugs  C. Current medications and supplements D. Functional ability and status E.  Nutritional status F.    Physical activity G. Advance directives H. List of other physicians I.  Hospitalizations, surgeries, and ER visits in previous 12 months J.  [place] to include hearing, vision, cognitive, depression L. Referrals and appointments - none  In addition, I have reviewed and discussed with patient certain preventive protocols, quality metrics, and best practice recommendations. A written personalized care plan for preventive services as well as general preventive health recommendations were provided to patient.  See attached scanned questionnaire for additional information.   Signed,   Allyn Kenner, LPN Health Advisor  I reviewed health advisors note, was available for consultation and agree with documentation and plan.  Carlos American. Harle Battiest  [facility] [phone] 8 am - 5 pm) [phone] (after hours)

## 2016-05-20 NOTE — Patient Instructions (Signed)
Logan Bean , Thank you for taking time to come for your Medicare Wellness Visit. I appreciate your ongoing commitment to your health goals. Please review the following plan we discussed and let me know if I can assist you in the future.   These are the goals we discussed: Goals    None      This is a list of the screening recommended for you and due dates:  Health Maintenance  Topic Date Due  . Shingles Vaccine  09/11/1982  . Flu Shot  02/12/2016  . Eye exam for diabetics  02/12/2016  . Hemoglobin A1C  07/30/2016  . Complete foot exam   11/20/2016  . Tetanus Vaccine  07/12/2021  . Pneumonia vaccines  Completed  Preventive Care for Adults  A healthy lifestyle and preventive care can promote health and wellness. Preventive health guidelines for adults include the following key practices.  . A routine yearly physical is a good way to check with your health care provider about your health and preventive screening. It is a chance to share any concerns and updates on your health and to receive a thorough exam.  . Visit your dentist for a routine exam and preventive care every 6 months. Brush your teeth twice a day and floss once a day. Good oral hygiene prevents tooth decay and gum disease.  . The frequency of eye exams is based on your age, health, family medical history, use  of contact lenses, and other factors. Follow your health care provider's ecommendations for frequency of eye exams.  . Eat a healthy diet. Foods like vegetables, fruits, whole grains, low-fat dairy products, and lean protein foods contain the nutrients you need without too many calories. Decrease your intake of foods high in solid fats, added sugars, and salt. Eat the right amount of calories for you. Get information about a proper diet from your health care provider, if necessary.  . Regular physical exercise is one of the most important things you can do for your health. Most adults should get at least 150 minutes  of moderate-intensity exercise (any activity that increases your heart rate and causes you to sweat) each week. In addition, most adults need muscle-strengthening exercises on 2 or more days a week.  Silver Sneakers may be a benefit available to you. To determine eligibility, you may visit the website: www.silversneakers.com or contact program at (985)375-8574 Mon-Fri between 8AM-8PM.   . Maintain a healthy weight. The body mass index (BMI) is a screening tool to identify possible weight problems. It provides an estimate of body fat based on height and weight. Your health care provider can find your BMI and can help you achieve or maintain a healthy weight.   For adults 20 years and older: ? A BMI below 18.5 is considered underweight. ? A BMI of 18.5 to 24.9 is normal. ? A BMI of 25 to 29.9 is considered overweight. ? A BMI of 30 and above is considered obese.   . Maintain normal blood lipids and cholesterol levels by exercising and minimizing your intake of saturated fat. Eat a balanced diet with plenty of fruit and vegetables. Blood tests for lipids and cholesterol should begin at age 60 and be repeated every 5 years. If your lipid or cholesterol levels are high, you are over 50, or you are at high risk for heart disease, you may need your cholesterol levels checked more frequently. Ongoing high lipid and cholesterol levels should be treated with medicines if diet and exercise  are not working.  . If you smoke, find out from your health care provider how to quit. If you do not use tobacco, please do not start.  . If you choose to drink alcohol, please do not consume more than 2 drinks per day. One drink is considered to be 12 ounces (355 mL) of beer, 5 ounces (148 mL) of wine, or 1.5 ounces (44 mL) of liquor.  . If you are 8-60 years old, ask your health care provider if you should take aspirin to prevent strokes.  . Use sunscreen. Apply sunscreen liberally and repeatedly throughout the day.  You should seek shade when your shadow is shorter than you. Protect yourself by wearing long sleeves, pants, a wide-brimmed hat, and sunglasses year round, whenever you are outdoors.  . Once a month, do a whole body skin exam, using a mirror to look at the skin on your back. Tell your health care provider of new moles, moles that have irregular borders, moles that are larger than a pencil eraser, or moles that have changed in shape or color.

## 2016-05-21 LAB — HEMOGLOBIN A1C
HEMOGLOBIN A1C: 7.1 % — AB (ref <5.7)
MEAN PLASMA GLUCOSE: 157 mg/dL

## 2016-05-21 LAB — T3: T3 TOTAL: 95 ng/dL (ref 76–181)

## 2016-05-22 ENCOUNTER — Other Ambulatory Visit: Payer: Self-pay

## 2016-05-22 NOTE — Telephone Encounter (Signed)
Patient is to begin taking ferrous sulfate 325 mg twice a day with a meal due to low iron levels. Patient stated that he understood and would begin the medication tomorrow.

## 2016-05-29 ENCOUNTER — Encounter (INDEPENDENT_AMBULATORY_CARE_PROVIDER_SITE_OTHER): Payer: Medicare Other | Admitting: Ophthalmology

## 2016-05-30 ENCOUNTER — Other Ambulatory Visit: Payer: Self-pay

## 2016-05-30 MED ORDER — FERROUS SULFATE 325 (65 FE) MG PO TBEC: 325.0000 mg | DELAYED_RELEASE_TABLET | Freq: Two times a day (BID) | ORAL | 1 refills | Status: DC

## 2016-06-16 ENCOUNTER — Other Ambulatory Visit: Payer: Self-pay | Admitting: Nurse Practitioner

## 2016-06-17 ENCOUNTER — Telehealth: Payer: Self-pay | Admitting: *Deleted

## 2016-06-17 MED ORDER — FERROUS SULFATE 325 (65 FE) MG PO TBEC: 325.0000 mg | DELAYED_RELEASE_TABLET | Freq: Two times a day (BID) | ORAL | 1 refills | Status: DC

## 2016-06-17 NOTE — Telephone Encounter (Signed)
Patient called and stated that he wanted the supplement Jessica recommended at his last appointment called into his pharmacy. Faxed Rx.

## 2016-06-19 DIAGNOSIS — M170 Bilateral primary osteoarthritis of knee: Secondary | ICD-10-CM | POA: Diagnosis not present

## 2016-07-01 ENCOUNTER — Encounter: Payer: Self-pay | Admitting: Nurse Practitioner

## 2016-07-01 ENCOUNTER — Ambulatory Visit (INDEPENDENT_AMBULATORY_CARE_PROVIDER_SITE_OTHER): Payer: Medicare Other | Admitting: Nurse Practitioner

## 2016-07-01 VITALS — BP 114/72 | HR 59 | Temp 98.3°F | Resp 17 | Ht 66.0 in | Wt 141.0 lb

## 2016-07-01 DIAGNOSIS — D508 Other iron deficiency anemias: Secondary | ICD-10-CM | POA: Diagnosis not present

## 2016-07-01 DIAGNOSIS — R197 Diarrhea, unspecified: Secondary | ICD-10-CM | POA: Diagnosis not present

## 2016-07-01 NOTE — Progress Notes (Signed)
Careteam: Patient Care Team: Lauree Chandler, NP as PCP - General (Nurse Practitioner)  Advanced Directive information Does Patient Have a Medical Advance Directive?: No  Allergies  Allergen Reactions  . Morphine And Related Nausea And Vomiting  . Oxycodone Nausea And Vomiting    Chief Complaint  Patient presents with  . Acute Visit    Diarrhea on 06/25/16, Pt worries if c diff is back; Black stool began 3 days ago but could be from hemorrhoid     HPI: Patient is a 82 y.o. male seen in the office today for recent diarrhea. He reports he has had 3 episodes in the past 4 months. Last episode of diarrhea 6 days ago and only happened once. Concerned because he had c. diff in the past (2014) and is concerned that it may be returning. When he has had diarrhea he either did not make it to the restroom or barely made it. Also reports dark stools for the past 3 days that began when he started taking iron BID. Last BM this morning. Denies any abdominal pain, nausea, vomting, or obvious signs of bleeding. Reports he has an internal hemorrhoid but that he has not had any problems with it in a long time.   Review of Systems:  Review of Systems  Constitutional: Negative for activity change, appetite change, chills, fever and unexpected weight change.  Respiratory: Negative for shortness of breath.   Cardiovascular: Negative for chest pain and palpitations.  Gastrointestinal: Positive for diarrhea (3 episodes over past 4 months). Negative for abdominal pain, blood in stool, constipation, nausea and vomiting.  Neurological: Negative for dizziness and light-headedness.    Past Medical History:  Diagnosis Date  . Arthritis    left knee  . Diabetes mellitus    diet controlled, pt does not check cbg  . Diaphragmatic hernia without mention of obstruction or gangrene   . Dizziness and giddiness   . Edema   . H/O hiatal hernia   . Hypercholesteremia   . Hypertension   . Left rotator cuff  tear Jul 12, 2012  . Stomach ulcer    years  . Unspecified hereditary and idiopathic peripheral neuropathy   . Unspecified vitamin D deficiency   . Varicose veins    both legs   Past Surgical History:  Procedure Laterality Date  . CARDIOVERSION N/A 02/22/2013   Procedure: CARDIOVERSION;  Surgeon: Darlin Coco, MD;  Location: [facility] ENDOSCOPY;  Service: Cardiovascular;  Laterality: N/A;  . Esopagus strectching  2003 and 3 yrs ago  . MASTECTOMY     bilateral  . ORIF ANKLE FRACTURE Right 09/20/2012   Procedure: OPEN REDUCTION INTERNAL FIXATION (ORIF) ANKLE FRACTURE;  Surgeon: Johnn Hai, MD;  Location: [facility] ORS;  Service: Orthopedics;  Laterality: Right;  . REVERSE SHOULDER ARTHROPLASTY Right 09/17/2012   Procedure: REVERSE SHOULDER ARTHROPLASTY;  Surgeon: Augustin Schooling, MD;  Location: [facility];  Service: Orthopedics;  Laterality: Right;  . SHOULDER OPEN ROTATOR CUFF REPAIR  11/05/2011   Procedure: ROTATOR CUFF REPAIR SHOULDER OPEN;  Surgeon: Tobi Bastos, MD;  Location: [facility]S;  Service: Orthopedics;  Laterality: Left;  Left Shoulder Rotator Cuff Repair Open with Graft and Anchors  . TONSILLECTOMY  age 45   Social History:   reports that he quit smoking about 19 years ago. His smoking use included Pipe and Cigarettes. He quit after 50.00 years of use. He has never used smokeless tobacco. He reports that he drinks alcohol. He reports that he does  not use drugs.  Family History  Problem Relation Age of Onset  . Parkinson's disease Mother   . Hip fracture Mother   . Heart disease Mother   . Alzheimer's disease Mother   . Heart disease Father   . Parkinson's disease Sister   . Heart disease Brother     Medications: Patient's Medications  New Prescriptions   No medications on file  Previous Medications   ACETAMINOPHEN (TYLENOL) 325 MG TABLET    Take 2 tablets (650 mg total) by mouth every 6 (six) hours as needed for mild pain (or Fever >/= 101).   CALCIUM CARBONATE-VITAMIN D  600-200 MG-UNIT TABS    Take 1 tablet by mouth 2 (two) times daily. Take 1 tablet daily for vitamin supplement.   ELIQUIS 5 MG TABS TABLET    TAKE 1 TABLET TWICE DAILY.   FERROUS SULFATE 325 (65 FE) MG EC TABLET    Take 1 tablet (325 mg total) by mouth 2 (two) times daily with a meal.   FUROSEMIDE (LASIX) 20 MG TABLET    Take 1/2 tablet by mouth once daily for edema,hypertension   GLUCOSAMINE-CHONDROIT-VIT C-MN (GLUCOSAMINE 1500 COMPLEX) CAPS    Take one tablet once a day   GLUCOSE BLOOD (TRUETEST TEST) TEST STRIP    E11.22 check blood sugar once daily   GLUCOSE MONITORING KIT (FREESTYLE) MONITORING KIT    1 each by Does not apply route as needed for other. Take blood sugar once daily.   LISINOPRIL (PRINIVIL,ZESTRIL) 20 MG TABLET    TAKE 1 TABLET DAILY FOR BLOOD PRESSURE.   METFORMIN (GLUCOPHAGE) 500 MG TABLET    TAKE ONE TABLET TWICE A DAY TO CONTROL BLOOD SUGAR.   METOPROLOL TARTRATE (LOPRESSOR) 25 MG TABLET    TAKE 1 TABLET TWICE DAILY.   MULTIPLE VITAMIN (MULITIVITAMIN WITH MINERALS) TABS    Take 1 tablet by mouth daily.    OMEGA-3 FATTY ACIDS (FISH OIL) 1000 MG CAPS    Take 1,000 mg by mouth daily.    SIMVASTATIN (ZOCOR) 10 MG TABLET    TAKE ONE TABLET AT BEDTIME.   TRUEPLUS LANCETS 30G MISC    Use to check blood sugar once daily. Dx E11.9  Modified Medications   No medications on file  Discontinued Medications   No medications on file     Physical Exam:  Vitals:   07/01/16 1046  BP: 114/72  Pulse: (!) 59  Resp: 17  Temp: 98.3 F (36.8 C)  TempSrc: Oral  SpO2: 95%  Weight: 141 lb (64 kg)  Height: '5\' 6"'  (1.676 m)   Body mass index is 22.76 kg/m.  Physical Exam  Constitutional: He is oriented to person, place, and time. He appears well-developed.  Frail.  HENT:  Head: Normocephalic and atraumatic.  Eyes: Pupils are equal, round, and reactive to light.  Neck: Normal range of motion. Neck supple.  Cardiovascular: Normal rate, regular rhythm and normal heart sounds.     Pulmonary/Chest: Effort normal and breath sounds normal.  Abdominal: Soft. Bowel sounds are normal.  Musculoskeletal: He exhibits no edema.  Neurological: He is alert and oriented to person, place, and time.  Skin: Skin is warm and dry.  Psychiatric: He has a normal mood and affect. His behavior is normal. Judgment and thought content normal.    Labs reviewed: Basic Metabolic Panel:  Recent Labs  07/27/15 0952 01/28/16 1400 02/14/16 1142 05/20/16 1135  NA 143 140  --  140  K 4.7 4.4  --  4.8  CL 104 104  --  105  CO2 23 27  --  26  GLUCOSE 129* 122*  --  115*  BUN 29 24  --  37*  CREATININE 0.95 0.86  --  1.04  CALCIUM 9.7 9.3  --  10.0  TSH  --   --  0.32* 0.37*   Liver Function Tests:  Recent Labs  07/27/15 0952 01/28/16 1400 05/20/16 1135  AST '18 13 13  ' ALT '17 11 13  ' ALKPHOS 95 73 73  BILITOT 0.7 0.8 0.8  PROT 5.8* 5.8* 5.7*  ALBUMIN 3.9 3.7 3.7   No results for input(s): LIPASE, AMYLASE in the last 8760 hours. No results for input(s): AMMONIA in the last 8760 hours. CBC:  Recent Labs  07/27/15 0952 01/28/16 1400 05/20/16 1135  WBC 8.5 5.5 9.2  NEUTROABS 6.3 3,850 6,992  HGB  --  12.0* 12.9*  HCT 39.2 38.5 40.9  MCV 80 82.1 81.0  PLT 235 239 243   Lipid Panel:  Recent Labs  01/28/16 1400  CHOL 142  HDL 48  LDLCALC 72  TRIG 111  CHOLHDL 3.0   TSH:  Recent Labs  02/14/16 1142 05/20/16 1135  TSH 0.32* 0.37*   A1C: Lab Results  Component Value Date   HGBA1C 7.1 (H) 05/20/2016     Assessment/Plan 1. Diarrhea, unspecified type - Most likely due to foods eaten as he is not currently having diarrhea. - Educated about c.diff and accompanying symptoms. - Advised that if he starts having diarrhea more often to keep a food diary.  - Call office if diarrhea returns and persists.  2. Iron deficiency anemia secondary to inadequate dietary iron intake -just started taking iron supplement, to  Take twice daily with food.  -Educated that  iron will make stools dark.  - Advised to take Colace for constipation.     Keep follow-up in February as scheduled or as needed before  Tangela Dolliver K. Harle Battiest  [facility] [phone] 8 am - 5 pm) [phone] (after hours)

## 2016-07-01 NOTE — Patient Instructions (Addendum)
Monitor for constipation from the iron.

## 2016-07-16 ENCOUNTER — Ambulatory Visit: Payer: Medicare Other | Admitting: Podiatry

## 2016-08-05 ENCOUNTER — Ambulatory Visit: Payer: Medicare Other | Admitting: Podiatry

## 2016-08-07 DIAGNOSIS — M1711 Unilateral primary osteoarthritis, right knee: Secondary | ICD-10-CM | POA: Diagnosis not present

## 2016-08-07 DIAGNOSIS — M170 Bilateral primary osteoarthritis of knee: Secondary | ICD-10-CM | POA: Diagnosis not present

## 2016-08-07 DIAGNOSIS — M1712 Unilateral primary osteoarthritis, left knee: Secondary | ICD-10-CM | POA: Diagnosis not present

## 2016-08-19 ENCOUNTER — Encounter: Payer: Self-pay | Admitting: Nurse Practitioner

## 2016-08-19 ENCOUNTER — Ambulatory Visit (INDEPENDENT_AMBULATORY_CARE_PROVIDER_SITE_OTHER): Payer: Medicare Other | Admitting: Nurse Practitioner

## 2016-08-19 VITALS — BP 118/64 | HR 82 | Temp 97.3°F | Resp 14 | Ht 66.0 in | Wt 140.0 lb

## 2016-08-19 DIAGNOSIS — E1122 Type 2 diabetes mellitus with diabetic chronic kidney disease: Secondary | ICD-10-CM | POA: Diagnosis not present

## 2016-08-19 DIAGNOSIS — N183 Chronic kidney disease, stage 3 (moderate): Secondary | ICD-10-CM

## 2016-08-19 DIAGNOSIS — K5909 Other constipation: Secondary | ICD-10-CM

## 2016-08-19 DIAGNOSIS — I482 Chronic atrial fibrillation, unspecified: Secondary | ICD-10-CM

## 2016-08-19 DIAGNOSIS — E0590 Thyrotoxicosis, unspecified without thyrotoxic crisis or storm: Secondary | ICD-10-CM

## 2016-08-19 DIAGNOSIS — D508 Other iron deficiency anemias: Secondary | ICD-10-CM | POA: Diagnosis not present

## 2016-08-19 DIAGNOSIS — R634 Abnormal weight loss: Secondary | ICD-10-CM

## 2016-08-19 DIAGNOSIS — I10 Essential (primary) hypertension: Secondary | ICD-10-CM | POA: Diagnosis not present

## 2016-08-19 NOTE — Progress Notes (Addendum)
Careteam: Patient Care Team: Lauree Chandler, NP as PCP - General (Nurse Practitioner)  Advanced Directive information Does Patient Have a Medical Advance Directive?: Yes, Type of Advance Directive: Healthcare Power of Attorney  Allergies  Allergen Reactions  . Morphine And Related Nausea And Vomiting  . Oxycodone Nausea And Vomiting    Chief Complaint  Patient presents with  . Medical Management of Chronic Issues    3 month routine follow-up      HPI: Patient is a 83 y.o. male seen in the office today for chronic disease follow-up. Pt has history of HTN, CHF, chronic afib, GERD, goiter, hyperthyroidism, DM-2, and CKD-Stage 3. No new complaints today. Overall doing well.  -Pt states that is still having constipation r/t iron supplementation and is taking medication every 2-3 days despite recommendations of BID.Last Iron=12.9 -DM 2- Pt is taking metformin with no s/s of hypoglycemia. Last A1C=7.1 -Weight loss- Pt reports two full meals a day with at least one Ensure supplementation. He also reports taking 1-2 servings of protein powder per day.    Review of Systems:  Review of Systems  Constitutional: Negative for activity change, appetite change, fatigue and fever.       Frail  HENT: Positive for hearing loss. Negative for trouble swallowing.        Mild  Eyes: Negative for visual disturbance.  Respiratory: Negative for chest tightness, shortness of breath and wheezing.   Cardiovascular: Negative for chest pain, palpitations and leg swelling.  Gastrointestinal: Positive for constipation. Negative for abdominal pain, blood in stool, diarrhea, nausea and vomiting.  Endocrine:       Denies s/s of hypoglycemia  Genitourinary: Negative for frequency and urgency.  Musculoskeletal: Positive for gait problem. Negative for joint swelling.       Uses rolling walker for ambulation, shuffling gait  Skin: Positive for color change.       LE vascular color changes, reddened.     Neurological: Negative for dizziness, syncope and headaches.  Psychiatric/Behavioral: Negative for agitation, behavioral problems and confusion.    Past Medical History:  Diagnosis Date  . Arthritis    left knee  . Diabetes mellitus    diet controlled, pt does not check cbg  . Diaphragmatic hernia without mention of obstruction or gangrene   . Dizziness and giddiness   . Edema   . H/O hiatal hernia   . Hypercholesteremia   . Hypertension   . Left rotator cuff tear Jul 12, 2012  . Stomach ulcer    years  . Unspecified hereditary and idiopathic peripheral neuropathy   . Unspecified vitamin D deficiency   . Varicose veins    both legs   Past Surgical History:  Procedure Laterality Date  . CARDIOVERSION N/A 02/22/2013   Procedure: CARDIOVERSION;  Surgeon: Darlin Coco, MD;  Location: [facility] ENDOSCOPY;  Service: Cardiovascular;  Laterality: N/A;  . Esopagus strectching  2003 and 3 yrs ago  . MASTECTOMY     bilateral  . ORIF ANKLE FRACTURE Right 09/20/2012   Procedure: OPEN REDUCTION INTERNAL FIXATION (ORIF) ANKLE FRACTURE;  Surgeon: Johnn Hai, MD;  Location: [facility];  Service: Orthopedics;  Laterality: Right;  . REVERSE SHOULDER ARTHROPLASTY Right 09/17/2012   Procedure: REVERSE SHOULDER ARTHROPLASTY;  Surgeon: Augustin Schooling, MD;  Location: [facility];  Service: Orthopedics;  Laterality: Right;  . SHOULDER OPEN ROTATOR CUFF REPAIR  11/05/2011   Procedure: ROTATOR CUFF REPAIR SHOULDER OPEN;  Surgeon: Tobi Bastos, MD;  Location: [facility];  Service: Orthopedics;  Laterality: Left;  Left Shoulder Rotator Cuff Repair Open with Graft and Anchors  . TONSILLECTOMY  age 42   Social History:   reports that he quit smoking about 20 years ago. His smoking use included Pipe and Cigarettes. He quit after 50.00 years of use. He has never used smokeless tobacco. He reports that he drinks alcohol. He reports that he does not use drugs.  Family History  Problem Relation Age of Onset  .  Parkinson's disease Mother   . Hip fracture Mother   . Heart disease Mother   . Alzheimer's disease Mother   . Heart disease Father   . Parkinson's disease Sister   . Heart disease Brother     Medications: Patient's Medications  New Prescriptions   No medications on file  Previous Medications   ACETAMINOPHEN (TYLENOL) 325 MG TABLET    Take 2 tablets (650 mg total) by mouth every 6 (six) hours as needed for mild pain (or Fever >/= 101).   CALCIUM CARBONATE-VITAMIN D 600-200 MG-UNIT TABS    Take 1 tablet by mouth 2 (two) times daily. Take 1 tablet daily for vitamin supplement.   ELIQUIS 5 MG TABS TABLET    TAKE 1 TABLET TWICE DAILY.   FERROUS SULFATE 325 (65 FE) MG TABLET    Takes one tablet by mouth every three days   FUROSEMIDE (LASIX) 20 MG TABLET    Take 1/2 tablet by mouth once daily for edema,hypertension   GLUCOSAMINE-CHONDROIT-VIT C-MN (GLUCOSAMINE 1500 COMPLEX) CAPS    Take one tablet once a day   GLUCOSE BLOOD (TRUETEST TEST) TEST STRIP    E11.22 check blood sugar once daily   GLUCOSE MONITORING KIT (FREESTYLE) MONITORING KIT    1 each by Does not apply route as needed for other. Take blood sugar once daily.   LISINOPRIL (PRINIVIL,ZESTRIL) 20 MG TABLET    TAKE 1 TABLET DAILY FOR BLOOD PRESSURE.   METFORMIN (GLUCOPHAGE) 500 MG TABLET    TAKE ONE TABLET TWICE A DAY TO CONTROL BLOOD SUGAR.   METOPROLOL TARTRATE (LOPRESSOR) 25 MG TABLET    TAKE 1 TABLET TWICE DAILY.   MULTIPLE VITAMIN (MULITIVITAMIN WITH MINERALS) TABS    Take 1 tablet by mouth daily.    OMEGA-3 FATTY ACIDS (FISH OIL) 1000 MG CAPS    Take 1,000 mg by mouth daily.    SIMVASTATIN (ZOCOR) 10 MG TABLET    TAKE ONE TABLET AT BEDTIME.   TRUEPLUS LANCETS 30G MISC    Use to check blood sugar once daily. Dx E11.9  Modified Medications   No medications on file  Discontinued Medications   FERROUS SULFATE 325 (65 FE) MG EC TABLET    Take 1 tablet (325 mg total) by mouth 2 (two) times daily with a meal.     Physical  Exam:  Vitals:   08/19/16 1101  BP: 118/64  Pulse: 82  Resp: 14  Temp: 97.3 F (36.3 C)  TempSrc: Oral  SpO2: 97%  Weight: 140 lb (63.5 kg)  Height: _0  (1.676 m)   Body mass index is 22.6 kg/m.  Physical Exam  Constitutional: He is oriented to person, place, and time.  Frail, pleasant, A&O  HENT:  Head: Normocephalic and atraumatic.  Eyes: Conjunctivae and EOM are normal. Pupils are equal, round, and reactive to light.  Cardiovascular: Normal heart sounds and intact distal pulses.   Afib, irregular  Pulmonary/Chest: Effort normal and breath sounds normal. No respiratory distress. He has no wheezes. He  exhibits no tenderness.  Abdominal: Soft. Bowel sounds are normal. He exhibits no distension. There is no tenderness.  Musculoskeletal: He exhibits no edema or tenderness.  Neurological: He is alert and oriented to person, place, and time.  Skin: Skin is warm and dry. There is erythema.  LE reddness. Left greater than right  Psychiatric: He has a normal mood and affect.    Labs reviewed: Basic Metabolic Panel:  Recent Labs  01/28/16 1400 02/14/16 1142 05/20/16 1135  NA 140  --  140  K 4.4  --  4.8  CL 104  --  105  CO2 27  --  26  GLUCOSE 122*  --  115*  BUN 24  --  37*  CREATININE 0.86  --  1.04  CALCIUM 9.3  --  10.0  TSH  --  0.32* 0.37*   Liver Function Tests:  Recent Labs  01/28/16 1400 05/20/16 1135  AST 13 13  ALT 11 13  ALKPHOS 73 73  BILITOT 0.8 0.8  PROT 5.8* 5.7*  ALBUMIN 3.7 3.7   No results for input(s): LIPASE, AMYLASE in the last 8760 hours. No results for input(s): AMMONIA in the last 8760 hours. CBC:  Recent Labs  01/28/16 1400 05/20/16 1135  WBC 5.5 9.2  NEUTROABS 3,850 6,992  HGB 12.0* 12.9*  HCT 38.5 40.9  MCV 82.1 81.0  PLT 239 243   Lipid Panel:  Recent Labs  01/28/16 1400  CHOL 142  HDL 48  LDLCALC 72  TRIG 111  CHOLHDL 3.0   TSH:  Recent Labs  02/14/16 1142 05/20/16 1135  TSH 0.32* 0.37*    A1C: Lab Results  Component Value Date   HGBA1C 7.1 (H) 05/20/2016     Assessment/Plan 1. Essential hypertension -Stable. Continue on lopressor, lisinopril.   2. Chronic atrial fibrillation (HCC) -Stable. Rate controlled. Continue on Eliquis. -Monitor for s/s of bleeding  3. Hyperthyroidism, subclinical -Stable. Cont to monitor   4. Type 2 diabetes mellitus with stage 3 chronic kidney disease, without long-term current use of insulin (HCC) -Pt denies s/s of hypoglycemic events despite. Continue to monitor for this. HBA1C=7.1 two months ago.  -Will decrease Metformin to 520m once daily   5. Loss of weight -Monitor weight loss. Try to take in 3 full meals daily with Ensure supplementation.  -Do not use Ensure as meal replacement. -Monitior weight with home scale   6. Iron deficiency anemia secondary to inadequate dietary iron intake -Educated on the importance of taking iron supplement as prescribed. -He is currently taking Iron every 2-3 days due to increased constipation despite iron=12.9 -to increase iron to daily   7. Constipation -Stay well hydrated. Increase water intake throughout the day. Recommend at least 8oz glass of plain water at least three time per day -Educated pt on the importance of increasing fiber in his diet -Will add Mirilax OTC daily to help with this.   Follow up in 3 months, will get labs at this visit unable to stay for labs today  Jullien Granquist K. EHarle Battiest [facility] [phone] am - 5 pm) [phone](after hours)

## 2016-08-19 NOTE — Patient Instructions (Signed)
Recommend iron at least once daily with meals  Can add miralax 17 gm as needed for constipation

## 2016-08-26 ENCOUNTER — Ambulatory Visit (INDEPENDENT_AMBULATORY_CARE_PROVIDER_SITE_OTHER): Payer: Medicare Other | Admitting: Podiatry

## 2016-08-26 DIAGNOSIS — B351 Tinea unguium: Secondary | ICD-10-CM | POA: Diagnosis not present

## 2016-08-26 DIAGNOSIS — M79676 Pain in unspecified toe(s): Secondary | ICD-10-CM

## 2016-08-26 DIAGNOSIS — E1151 Type 2 diabetes mellitus with diabetic peripheral angiopathy without gangrene: Secondary | ICD-10-CM | POA: Diagnosis not present

## 2016-08-26 NOTE — Progress Notes (Signed)
Patient ID: Logan Bean, male   DOB: Oct 28, 1920, 83 y.o.   MRN: [id number]    Subjective: This patient presents today complaining of thickened and elongated toenails which are uncomfortable when walking wearing shoes and requests toenail debridement.  Objective: Orientated 3 DP and PT pulses 1/4 bilaterally Capillary reflex immediate bilaterally Hammertoes 2-5 bilaterally Keratoses distal second left toe No open skin lesions bilaterally Atrophic dry skin bilaterally The toenails are extremely elongated, brittle, deformed, discolored and tender direct palpation 6-10 Hammertoe 2-5 bilaterally Manual motor testing dorsi flexion, plantar flexion 5/5 bilaterally Patient walks slowly with walker  Assessment: symptomatic onychomycoses 6-10 Type II diabetic with peripheral arterial disease  Plan: Debridement toenails 6-10 mechanically and elected without any bleeding Debrided keratoses 1 without any bleeding  Reappoint 3 months

## 2016-08-26 NOTE — Patient Instructions (Signed)

## 2016-09-04 DIAGNOSIS — M170 Bilateral primary osteoarthritis of knee: Secondary | ICD-10-CM | POA: Diagnosis not present

## 2016-09-22 ENCOUNTER — Other Ambulatory Visit: Payer: Self-pay | Admitting: *Deleted

## 2016-09-22 MED ORDER — METFORMIN HCL 500 MG PO TABS: ORAL_TABLET | ORAL | 1 refills | Status: DC

## 2016-09-22 NOTE — Telephone Encounter (Signed)
[facility]  

## 2016-10-02 DIAGNOSIS — M170 Bilateral primary osteoarthritis of knee: Secondary | ICD-10-CM | POA: Diagnosis not present

## 2016-10-28 ENCOUNTER — Telehealth: Payer: Self-pay | Admitting: *Deleted

## 2016-10-28 ENCOUNTER — Ambulatory Visit (INDEPENDENT_AMBULATORY_CARE_PROVIDER_SITE_OTHER): Payer: Medicare Other | Admitting: Sports Medicine

## 2016-10-28 ENCOUNTER — Encounter: Payer: Self-pay | Admitting: Sports Medicine

## 2016-10-28 ENCOUNTER — Ambulatory Visit (INDEPENDENT_AMBULATORY_CARE_PROVIDER_SITE_OTHER): Payer: Medicare Other

## 2016-10-28 DIAGNOSIS — E1151 Type 2 diabetes mellitus with diabetic peripheral angiopathy without gangrene: Secondary | ICD-10-CM | POA: Diagnosis not present

## 2016-10-28 DIAGNOSIS — M79671 Pain in right foot: Secondary | ICD-10-CM

## 2016-10-28 DIAGNOSIS — L97511 Non-pressure chronic ulcer of other part of right foot limited to breakdown of skin: Secondary | ICD-10-CM

## 2016-10-28 MED ORDER — CEPHALEXIN 500 MG PO CAPS: 500.0000 mg | ORAL_CAPSULE | Freq: Three times a day (TID) | ORAL | 0 refills | Status: DC

## 2016-10-28 NOTE — Telephone Encounter (Addendum)
Pt states he has something important to tell Dr. Cannon Kettle. I spoke with pt and he states he forgot to tell Dr. Cannon Kettle that he had C-Diff and needed to be careful with antibiotics, and he hasn't taken the ones prescribed today. I told him I would inform Dr. Cannon Kettle and call again. I informed pt of Dr. Leeanne Rio orders and pt states understanding and that he eats yogurt everyday for breakfast.

## 2016-10-28 NOTE — Telephone Encounter (Signed)
Let him know that if worsens recommend to start antibiotics and take a probiotic or eat yogurt -Dr. Chauncey Cruel

## 2016-10-28 NOTE — Progress Notes (Signed)
Subjective: Logan Bean is a 83 y.o. male patient seen in office for evaluation of ulceration of the top of right foot Patient has a history of diabetes and a blood glucose level not recorded. Patient states that it started from wearing a shoe that rubbed the top of his foot at the bone spur after he had used an acid pad. Denies nausea/fever/vomiting/chills/night sweats/shortness of breath/pain. Patient has no other pedal complaints at this time.  Patient Active Problem List   Diagnosis Date Noted  . Iron deficiency anemia secondary to inadequate dietary iron intake 08/19/2016  . Other constipation 08/19/2016  . Capillary vascular disease 11/21/2015  . Venous ulcer of ankle ([place]) 11/02/2015  . Near syncope 04/27/2015  . Type 2 diabetes mellitus with hyperglycemia ([place]) 04/27/2015  . Contusion of multiple sites 04/26/2015  . Type 2 diabetes mellitus with diabetic chronic kidney disease ([place]) 07/11/2014  . Colon cancer screening 07/11/2014  . Hygroma 05/19/2014  . Atrial fibrillation with RVR ([place]) 05/18/2014  . Laceration of skin of face 05/18/2014  . Fall   . Venous stasis dermatitis 05/16/2014  . Carpal tunnel syndrome 05/10/2014  . CKD (chronic kidney disease) stage 3, GFR 30-59 ml/min 10/11/2013  . Restless leg syndrome 05/25/2013  . Hyperthyroidism, subclinical 03/02/2013  . Chronic anticoagulation-Eliquis 01/31/2013  . Chronic atrial fibrillation ([place]) 01/21/2013  . Sweating increase 01/19/2013  . CHF (congestive heart failure) ([place]) 01/19/2013  . Pleural effusion 01/19/2013  . Multinodular goiter 01/19/2013  . Dyspnea 01/15/2013  . Edema 01/15/2013  . Hypokalemia 01/15/2013  . Rash and nonspecific skin eruption 01/15/2013  . Dyslipidemia 12/29/2012  . GERD (gastroesophageal reflux disease) 12/29/2012  . Loss of weight 12/29/2012  . Closed bimalleolar fracture of right ankle 09/28/2012  . Diarrhea 09/22/2012  . Essential hypertension 09/21/2012  . Complete tear of left  rotator cuff 11/05/2011   Current Outpatient Prescriptions on File Prior to Visit  Medication Sig Dispense Refill  . acetaminophen (TYLENOL) 325 MG tablet Take 2 tablets (650 mg total) by mouth every 6 (six) hours as needed for mild pain (or Fever >/= 101).    . Calcium Carbonate-Vitamin D 600-200 MG-UNIT TABS Take 1 tablet by mouth 2 (two) times daily. Take 1 tablet daily for vitamin supplement.    Marland Kitchen ELIQUIS 5 MG TABS tablet TAKE 1 TABLET TWICE DAILY. 180 tablet 1  . ferrous sulfate 325 (65 FE) MG tablet Takes one tablet by mouth every three days    . furosemide (LASIX) 20 MG tablet Take 1/2 tablet by mouth once daily for edema,hypertension 45 tablet 1  . Glucosamine-Chondroit-Vit C-Mn (GLUCOSAMINE 1500 COMPLEX) CAPS Take one tablet once a day 30 each 0  . glucose blood (TRUETEST TEST) test strip E11.22 check blood sugar once daily 50 each PRN  . glucose monitoring kit (FREESTYLE) monitoring kit 1 each by Does not apply route as needed for other. Take blood sugar once daily. 1 each 0  . lisinopril (PRINIVIL,ZESTRIL) 20 MG tablet TAKE 1 TABLET DAILY FOR BLOOD PRESSURE. 90 tablet 1  . metFORMIN (GLUCOPHAGE) 500 MG tablet Take one tablet by mouth once daily to control blood sugar 90 tablet 1  . metoprolol tartrate (LOPRESSOR) 25 MG tablet TAKE 1 TABLET TWICE DAILY. 180 tablet 1  . Multiple Vitamin (MULITIVITAMIN WITH MINERALS) TABS Take 1 tablet by mouth daily.     . Omega-3 Fatty Acids (FISH OIL) 1000 MG CAPS Take 1,000 mg by mouth daily.     . simvastatin (ZOCOR) 10 MG tablet TAKE  ONE TABLET AT BEDTIME. 90 tablet 1  . TRUEPLUS LANCETS 30G MISC Use to check blood sugar once daily. Dx E11.9 50 each 12   No current facility-administered medications on file prior to visit.    Allergies  Allergen Reactions  . Morphine And Related Nausea And Vomiting  . Oxycodone Nausea And Vomiting    No results found for this or any previous visit (from the past 2160 hour(s)).  Objective: There were no  vitals filed for this visit.  General: Patient is awake, alert, oriented x 3 and in no acute distress.  Dermatology: Skin is cool and dry bilateral with a partial thickness ulceration present  Right dorsal midfoot Ulceration measures 0.1 cm x 0.2cm x 0.2cm. There is a granular base. The ulceration does not  probe to bone. There is no malodor, no active drainage, focal erythema, no edema. No acute signs of infection.   Vascular: Dorsalis Pedis pulse = 0/4 Bilateral,  Posterior Tibial pulse = 0/4 Bilateral,  Capillary Fill Time < 5 seconds  Neurologic: Protective sensation diminished bilateral.   Musculosketal: No Pain with palpation to ulcerated area. No pain with compression to calves bilateral. Pes planus with palpable bone spur dorsal midfoot on right gross bony deformities noted bilateral.  Xrays, Right foot:At area of ulceration, No bony destruction suggestive of osteomyelitis. No gas in soft tissues. Diffuse arthritis, hammertoes, pes planus. + Ankle hardware.   Assessment and Plan:  Problem List Items Addressed This Visit    None    Visit Diagnoses    Right foot pain    -  Primary   Relevant Orders   DG Foot 2 Views Right   Skin ulcer of right foot, limited to breakdown of skin (HCC)       Relevant Medications   cephALEXin (KEFLEX) 500 MG capsule   Diabetic peripheral vascular disorder ([place])         -Examined patient and discussed the progression of the wound and treatment alternatives. -Xrays reviewed - Cleanse ulceration  -Applied triple antibiotic cream and dry sterile dressing and instructed patient to continue with daily dressings at home consisting of same -Rx Keflex for preventative measures  - Advised patient to go to the ER or return to office if the wound worsens or if constitutional symptoms are present. -Patient to return to office in 2 weeks for follow up care and evaluation or sooner if problems arise.  Landis Martins, DPM

## 2016-11-04 DIAGNOSIS — M170 Bilateral primary osteoarthritis of knee: Secondary | ICD-10-CM | POA: Diagnosis not present

## 2016-11-11 ENCOUNTER — Ambulatory Visit: Payer: Medicare Other | Admitting: Podiatry

## 2016-11-18 ENCOUNTER — Ambulatory Visit (INDEPENDENT_AMBULATORY_CARE_PROVIDER_SITE_OTHER): Payer: Medicare Other | Admitting: Nurse Practitioner

## 2016-11-18 ENCOUNTER — Other Ambulatory Visit: Payer: Medicare Other

## 2016-11-18 ENCOUNTER — Encounter: Payer: Self-pay | Admitting: Nurse Practitioner

## 2016-11-18 VITALS — BP 112/62 | HR 64 | Temp 97.9°F | Resp 18 | Ht 66.0 in | Wt 134.4 lb

## 2016-11-18 DIAGNOSIS — I10 Essential (primary) hypertension: Secondary | ICD-10-CM

## 2016-11-18 DIAGNOSIS — R634 Abnormal weight loss: Secondary | ICD-10-CM

## 2016-11-18 DIAGNOSIS — E0590 Thyrotoxicosis, unspecified without thyrotoxic crisis or storm: Secondary | ICD-10-CM

## 2016-11-18 DIAGNOSIS — E785 Hyperlipidemia, unspecified: Secondary | ICD-10-CM | POA: Diagnosis not present

## 2016-11-18 DIAGNOSIS — D508 Other iron deficiency anemias: Secondary | ICD-10-CM

## 2016-11-18 DIAGNOSIS — E1122 Type 2 diabetes mellitus with diabetic chronic kidney disease: Secondary | ICD-10-CM

## 2016-11-18 DIAGNOSIS — L723 Sebaceous cyst: Secondary | ICD-10-CM

## 2016-11-18 DIAGNOSIS — N183 Chronic kidney disease, stage 3 unspecified: Secondary | ICD-10-CM

## 2016-11-18 DIAGNOSIS — I482 Chronic atrial fibrillation, unspecified: Secondary | ICD-10-CM

## 2016-11-18 LAB — CBC WITH DIFFERENTIAL/PLATELET
Basophils Absolute: 0 {cells}/uL (ref 0–200)
Basophils Relative: 0 %
Eosinophils Absolute: 106 {cells}/uL (ref 15–500)
Eosinophils Relative: 1 %
HCT: 45.6 % (ref 38.5–50.0)
Hemoglobin: 14.4 g/dL (ref 13.2–17.1)
Lymphocytes Relative: 11 %
Lymphs Abs: 1166 {cells}/uL (ref 850–3900)
MCH: 27.9 pg (ref 27.0–33.0)
MCHC: 31.6 g/dL — ABNORMAL LOW (ref 32.0–36.0)
MCV: 88.2 fL (ref 80.0–100.0)
MPV: 10.9 fL (ref 7.5–12.5)
Monocytes Absolute: 636 {cells}/uL (ref 200–950)
Monocytes Relative: 6 %
Neutro Abs: 8692 {cells}/uL — ABNORMAL HIGH (ref 1500–7800)
Neutrophils Relative %: 82 %
Platelets: 235 K/uL (ref 140–400)
RBC: 5.17 MIL/uL (ref 4.20–5.80)
RDW: 17 % — ABNORMAL HIGH (ref 11.0–15.0)
WBC: 10.6 K/uL (ref 3.8–10.8)

## 2016-11-18 LAB — COMPLETE METABOLIC PANEL WITHOUT GFR
ALT: 17 U/L (ref 9–46)
AST: 14 U/L (ref 10–35)
Albumin: 3.7 g/dL (ref 3.6–5.1)
Alkaline Phosphatase: 69 U/L (ref 40–115)
BUN: 38 mg/dL — ABNORMAL HIGH (ref 7–25)
CO2: 28 mmol/L (ref 20–31)
Calcium: 9.5 mg/dL (ref 8.6–10.3)
Chloride: 104 mmol/L (ref 98–110)
Creat: 1.07 mg/dL (ref 0.70–1.11)
GFR, Est African American: 68 mL/min (ref >=60)
GFR, Est Non African American: 59 mL/min — ABNORMAL LOW (ref >=60)
Glucose, Bld: 184 mg/dL — ABNORMAL HIGH (ref 65–99)
Potassium: 4.9 mmol/L (ref 3.5–5.3)
Sodium: 141 mmol/L (ref 135–146)
Total Bilirubin: 1.1 mg/dL (ref 0.2–1.2)
Total Protein: 5.9 g/dL — ABNORMAL LOW (ref 6.1–8.1)

## 2016-11-18 LAB — LIPID PANEL
CHOL/HDL RATIO: 3.5 ratio (ref <5.0)
CHOLESTEROL: 184 mg/dL (ref <200)
HDL: 53 mg/dL (ref >40)
LDL Cholesterol: 107 mg/dL — ABNORMAL HIGH (ref <100)
TRIGLYCERIDES: 119 mg/dL (ref <150)
VLDL: 24 mg/dL (ref <30)

## 2016-11-18 LAB — TSH: TSH: 0.4 m[IU]/L (ref 0.40–4.50)

## 2016-11-18 MED ORDER — LISINOPRIL 10 MG PO TABS: 10.0000 mg | ORAL_TABLET | Freq: Every day | ORAL | 3 refills | Status: DC

## 2016-11-18 NOTE — Progress Notes (Signed)
Careteam: Patient Care Team: Lauree Chandler, NP as PCP - General (Nurse Practitioner)  Advanced Directive information Does Patient Have a Medical Advance Directive?: Yes, Type of Advance Directive: Healthcare Power of Attorney  Allergies  Allergen Reactions  . Morphine And Related Nausea And Vomiting  . Oxycodone Nausea And Vomiting    Chief Complaint  Patient presents with  . Medical Management of Chronic Issues    Pt is being seen for a 3 month routine follow up.      HPI: Patient is a 83 y.o. male seen in the office today for routine follow up. Pt with hx of HTN, CHF, chronic afib, GERD, goiter, hyperthyroidism, DM-2, and CKD-Stage 3. Reports his appetite has decreased, does not eat as much as he used to. No trouble swallowing or pain.  Does not eat 3 meals a day, only eats when he is hungry and sometimes he is not hungry. Eats 1 meal a day.  Small white filled pocket on his arms at times;  does not itch, not draining but able to squeeze out white stuff.  Taking iron "as often as he can" having constipation with iron supplement.  Taking senokot daily.  Taking metformin 500 mg daily, no hypoglycemia- reports average blood sugar of 145 Seeing podiatrist due to bone spur, changing his foot wear as also helped.  Has a lady home Instead home out twice weekly to help him. They go to appts and grocery shopping.  No falls since last visit.  Review of Systems:  Review of Systems  Constitutional: Negative for activity change, appetite change, fatigue and fever.       Frail  HENT: Positive for hearing loss. Negative for trouble swallowing.        Mild  Eyes: Negative for visual disturbance.  Respiratory: Negative for chest tightness, shortness of breath and wheezing.   Cardiovascular: Negative for chest pain, palpitations and leg swelling.  Gastrointestinal: Positive for constipation. Negative for abdominal pain, blood in stool, diarrhea, nausea and vomiting.  Endocrine:      Denies s/s of hypoglycemia  Genitourinary: Negative for frequency and urgency.  Musculoskeletal: Positive for gait problem. Negative for joint swelling.       Uses rolling walker for ambulation, shuffling gait  Skin: Positive for color change.       LE vascular color changes, reddened.   Neurological: Negative for dizziness, syncope and headaches.  Hematological: Bruises/bleeds easily.  Psychiatric/Behavioral: Negative for agitation, behavioral problems and confusion.   Past Medical History:  Diagnosis Date  . Arthritis    left knee  . Diabetes mellitus    diet controlled, pt does not check cbg  . Diaphragmatic hernia without mention of obstruction or gangrene   . Dizziness and giddiness   . Edema   . H/O hiatal hernia   . Hypercholesteremia   . Hypertension   . Left rotator cuff tear Jul 12, 2012  . Stomach ulcer    years  . Unspecified hereditary and idiopathic peripheral neuropathy   . Unspecified vitamin D deficiency   . Varicose veins    both legs   Past Surgical History:  Procedure Laterality Date  . CARDIOVERSION N/A 02/22/2013   Procedure: CARDIOVERSION;  Surgeon: Darlin Coco, MD;  Location[facility] ENDOSCOPY;  Service: Cardiovascular;  Laterality: N/A;  . Esopagus strectching  2003 and 3 yrs ago  . MASTECTOMY     bilateral  . ORIF ANKLE FRACTURE Right 09/20/2012   Procedure: OPEN REDUCTION INTERNAL FIXATION (ORIF) ANKLE  FRACTURE;  Surgeon: Johnn Hai, MD;  Location: [facility] ORS;  Service: Orthopedics;  Laterality: Right;  . REVERSE SHOULDER ARTHROPLASTY Right 09/17/2012   Procedure: REVERSE SHOULDER ARTHROPLASTY;  Surgeon: Augustin Schooling, MD;  Location: [place];  Service: Orthopedics;  Laterality: Right;  . SHOULDER OPEN ROTATOR CUFF REPAIR  11/05/2011   Procedure: ROTATOR CUFF REPAIR SHOULDER OPEN;  Surgeon: Tobi Bastos, MD;  Location: [facility] ORS;  Service: Orthopedics;  Laterality: Left;  Left Shoulder Rotator Cuff Repair Open with Graft and Anchors  .  TONSILLECTOMY  age 20   Social History:   reports that he quit smoking about 20 years ago. His smoking use included Pipe and Cigarettes. He quit after 50.00 years of use. He has never used smokeless tobacco. He reports that he drinks alcohol. He reports that he does not use drugs.  Family History  Problem Relation Age of Onset  . Parkinson's disease Mother   . Hip fracture Mother   . Heart disease Mother   . Alzheimer's disease Mother   . Heart disease Father   . Parkinson's disease Sister   . Heart disease Brother     Medications: Patient's Medications  New Prescriptions   No medications on file  Previous Medications   ACETAMINOPHEN (TYLENOL) 325 MG TABLET    Take 2 tablets (650 mg total) by mouth every 6 (six) hours as needed for mild pain (or Fever >/= 101).   CALCIUM CARBONATE-VITAMIN D 600-200 MG-UNIT TABS    Take 1 tablet by mouth 2 (two) times daily. Take 1 tablet daily for vitamin supplement.   ELIQUIS 5 MG TABS TABLET    TAKE 1 TABLET TWICE DAILY.   FERROUS SULFATE 325 (65 FE) MG TABLET    Takes one tablet by mouth every three days   FUROSEMIDE (LASIX) 20 MG TABLET    Take 1/2 tablet by mouth once daily for edema,hypertension   GLUCOSAMINE-CHONDROIT-VIT C-MN (GLUCOSAMINE 1500 COMPLEX) CAPS    Take one tablet once a day   GLUCOSE BLOOD (TRUETEST TEST) TEST STRIP    E11.22 check blood sugar once daily   GLUCOSE MONITORING KIT (FREESTYLE) MONITORING KIT    1 each by Does not apply route as needed for other. Take blood sugar once daily.   LISINOPRIL (PRINIVIL,ZESTRIL) 20 MG TABLET    TAKE 1 TABLET DAILY FOR BLOOD PRESSURE.   METFORMIN (GLUCOPHAGE) 500 MG TABLET    Take one tablet by mouth once daily to control blood sugar   METOPROLOL TARTRATE (LOPRESSOR) 25 MG TABLET    TAKE 1 TABLET TWICE DAILY.   MULTIPLE VITAMIN (MULITIVITAMIN WITH MINERALS) TABS    Take 1 tablet by mouth daily.    OMEGA-3 FATTY ACIDS (FISH OIL) 1000 MG CAPS    Take 1,000 mg by mouth daily.    SIMVASTATIN  (ZOCOR) 10 MG TABLET    TAKE ONE TABLET AT BEDTIME.   TRUEPLUS LANCETS 30G MISC    Use to check blood sugar once daily. Dx E11.9  Modified Medications   No medications on file  Discontinued Medications   CEPHALEXIN (KEFLEX) 500 MG CAPSULE    Take 1 capsule (500 mg total) by mouth 3 (three) times daily.     Physical Exam:  Vitals:   11/18/16 0934  BP: 112/62  Pulse: 64  Resp: 18  Temp: 97.9 F (36.6 C)  TempSrc: Oral  SpO2: 95%  Weight: 134 lb 6.4 oz (61 kg)  Height: _0  (1.676 m)   Body mass index  is 21.69 kg/m.  Physical Exam  Constitutional: He is oriented to person, place, and time. He appears well-developed and well-nourished.  Frail, pleasant, A&O  HENT:  Head: Normocephalic and atraumatic.  Eyes: Conjunctivae and EOM are normal. Pupils are equal, round, and reactive to light.  Cardiovascular: Normal heart sounds and intact distal pulses.   Afib, irregular  Pulmonary/Chest: Effort normal and breath sounds normal. No respiratory distress. He has no wheezes. He exhibits no tenderness.  Abdominal: Soft. Bowel sounds are normal. He exhibits no distension. There is no tenderness.  Musculoskeletal: He exhibits no edema or tenderness.  Neurological: He is alert and oriented to person, place, and time.  Skin: Skin is warm and dry. No erythema.  Psychiatric: He has a normal mood and affect.    Labs reviewed: Basic Metabolic Panel:  Recent Labs  01/28/16 1400 02/14/16 1142 05/20/16 1135  NA 140  --  140  K 4.4  --  4.8  CL 104  --  105  CO2 27  --  26  GLUCOSE 122*  --  115*  BUN 24  --  37*  CREATININE 0.86  --  1.04  CALCIUM 9.3  --  10.0  TSH  --  0.32* 0.37*   Liver Function Tests:  Recent Labs  01/28/16 1400 05/20/16 1135  AST 13 13  ALT 11 13  ALKPHOS 73 73  BILITOT 0.8 0.8  PROT 5.8* 5.7*  ALBUMIN 3.7 3.7   No results for input(s): LIPASE, AMYLASE in the last 8760 hours. No results for input(s): AMMONIA in the last 8760  hours. CBC:  Recent Labs  01/28/16 1400 05/20/16 1135  WBC 5.5 9.2  NEUTROABS 3,850 6,992  HGB 12.0* 12.9*  HCT 38.5 40.9  MCV 82.1 81.0  PLT 239 243   Lipid Panel:  Recent Labs  01/28/16 1400  CHOL 142  HDL 48  LDLCALC 72  TRIG 111  CHOLHDL 3.0   TSH:  Recent Labs  02/14/16 1142 05/20/16 1135  TSH 0.32* 0.37*   A1C: Lab Results  Component Value Date   HGBA1C 7.1 (H) 05/20/2016     Assessment/Plan 1. Essential hypertension -stable, will decrease lisinopril at this time.  - lisinopril (PRINIVIL,ZESTRIL) 10 MG tablet; Take 1 tablet (10 mg total) by mouth daily. for blood pressure  Dispense: 30 tablet; Refill: 3  2. Chronic atrial fibrillation (HCC) Rate controlled; conts on eliquis for anticoagulation.   3. Hyperlipidemia, unspecified hyperlipidemia type -consider stopping zocor, will follow up labs  - Lipid Panel - CMP with eGFR  4. CKD (chronic kidney disease) stage 3, GFR 30-59 ml/min -Encourage proper hydration and to avoid NSAIDS (Aleve, Advil, Motrin, Ibuprofen)  - CMP with eGFR  5. Iron deficiency anemia secondary to inadequate dietary iron intake -conts on iron supplement, encouraged compliance  - CBC with Differential/Platelets  6. Hyperthyroidism, subclinical - not currently on medications, will follow up TSH  7. Type 2 diabetes mellitus with stage 3 chronic kidney disease, without long-term current use of insulin (HCC) -currently on metformin  - Hemoglobin A1c  8. Sebaceous cyst -stable, no signs of infection on arms or rash, none noted during exam.  9. Loss of weight -ongoing weight loss from 140-134; using supplements as meal replacements and does not eat routinely because he is not hungry. Again encouraged proper nutrition, eating 3 meals a day and not using supplement has replacement.   Carlos American. Harle Battiest  [facility] [phone] 8 am - 5  pm) [phone] (after hours)

## 2016-11-18 NOTE — Patient Instructions (Addendum)
Decrease lisinopril to 10 mg daily** new prescription was sent to your pharmacy  Try to increase protein in diet.

## 2016-11-19 LAB — HEMOGLOBIN A1C
Hgb A1c MFr Bld: 8.7 % — ABNORMAL HIGH (ref <5.7)
MEAN PLASMA GLUCOSE: 203 mg/dL

## 2016-11-25 ENCOUNTER — Telehealth: Payer: Self-pay

## 2016-11-25 MED ORDER — LINAGLIPTIN 5 MG PO TABS: 5.0000 mg | ORAL_TABLET | Freq: Every day | ORAL | 1 refills | Status: DC

## 2016-11-25 NOTE — Telephone Encounter (Signed)
Prescription was called in to pharmacy for # 90 with 1 refill.

## 2016-11-25 NOTE — Telephone Encounter (Signed)
-----   Message from Lauree Chandler, NP sent at 11/24/2016  1:17 PM EDT ----- A1c is worse and too high, lets add tradjenta 5 mg daily by mouth for diabetes.

## 2016-11-27 ENCOUNTER — Other Ambulatory Visit: Payer: Self-pay

## 2016-11-27 MED ORDER — METFORMIN HCL 500 MG PO TABS: 500.0000 mg | ORAL_TABLET | Freq: Two times a day (BID) | ORAL | 6 refills | Status: DC

## 2016-11-27 NOTE — Telephone Encounter (Signed)
A fax was received from [facility] stating that Logan Bean will cost patient $220 and that patient requested an alternative medication. Logan Bean said to have patient increase metformin 500 mg to twice daily with meals.  Patient was notified of medication change and medication list was updated. Pharmacy was notified.

## 2016-12-02 ENCOUNTER — Ambulatory Visit: Payer: Medicare Other | Admitting: Podiatry

## 2016-12-02 DIAGNOSIS — M170 Bilateral primary osteoarthritis of knee: Secondary | ICD-10-CM | POA: Diagnosis not present

## 2016-12-11 ENCOUNTER — Other Ambulatory Visit: Payer: Self-pay | Admitting: Nurse Practitioner

## 2016-12-22 ENCOUNTER — Other Ambulatory Visit: Payer: Self-pay | Admitting: Nurse Practitioner

## 2017-01-01 DIAGNOSIS — M170 Bilateral primary osteoarthritis of knee: Secondary | ICD-10-CM | POA: Diagnosis not present

## 2017-01-01 DIAGNOSIS — M1711 Unilateral primary osteoarthritis, right knee: Secondary | ICD-10-CM | POA: Diagnosis not present

## 2017-01-01 DIAGNOSIS — M1712 Unilateral primary osteoarthritis, left knee: Secondary | ICD-10-CM | POA: Diagnosis not present

## 2017-01-06 ENCOUNTER — Ambulatory Visit (INDEPENDENT_AMBULATORY_CARE_PROVIDER_SITE_OTHER): Payer: Medicare Other | Admitting: Podiatry

## 2017-01-06 ENCOUNTER — Encounter: Payer: Self-pay | Admitting: Podiatry

## 2017-01-06 DIAGNOSIS — E1151 Type 2 diabetes mellitus with diabetic peripheral angiopathy without gangrene: Secondary | ICD-10-CM | POA: Diagnosis not present

## 2017-01-06 DIAGNOSIS — M79676 Pain in unspecified toe(s): Secondary | ICD-10-CM

## 2017-01-06 DIAGNOSIS — B351 Tinea unguium: Secondary | ICD-10-CM

## 2017-01-06 NOTE — Patient Instructions (Signed)
Remove the bandage in the fifth left toe 1-3 days and continue applying topical antibiotic ointment and a Band-Aid until a scab forms  Diabetes and Foot Care Diabetes may cause you to have problems because of poor blood supply (circulation) to your feet and legs. This may cause the skin on your feet to become thinner, break easier, and heal more slowly. Your skin may become dry, and the skin may peel and crack. You may also have nerve damage in your legs and feet causing decreased feeling in them. You may not notice minor injuries to your feet that could lead to infections or more serious problems. Taking care of your feet is one of the most important things you can do for yourself. Follow these instructions at home:  Wear shoes at all times, even in the house. Do not go barefoot. Bare feet are easily injured.  Check your feet daily for blisters, cuts, and redness. If you cannot see the bottom of your feet, use a mirror or ask someone for help.  Wash your feet with warm water (do not use hot water) and mild soap. Then pat your feet and the areas between your toes until they are completely dry. Do not soak your feet as this can dry your skin.  Apply a moisturizing lotion or petroleum jelly (that does not contain alcohol and is unscented) to the skin on your feet and to dry, brittle toenails. Do not apply lotion between your toes.  Trim your toenails straight across. Do not dig under them or around the cuticle. File the edges of your nails with an emery board or nail file.  Do not cut corns or calluses or try to remove them with medicine.  Wear clean socks or stockings every day. Make sure they are not too tight. Do not wear knee-high stockings since they may decrease blood flow to your legs.  Wear shoes that fit properly and have enough cushioning. To break in new shoes, wear them for just a few hours a day. This prevents you from injuring your feet. Always look in your shoes before you put them  on to be sure there are no objects inside.  Do not cross your legs. This may decrease the blood flow to your feet.  If you find a minor scrape, cut, or break in the skin on your feet, keep it and the skin around it clean and dry. These areas may be cleansed with mild soap and water. Do not cleanse the area with peroxide, alcohol, or iodine.  When you remove an adhesive bandage, be sure not to damage the skin around it.  If you have a wound, look at it several times a day to make sure it is healing.  Do not use heating pads or hot water bottles. They may burn your skin. If you have lost feeling in your feet or legs, you may not know it is happening until it is too late.  Make sure your health care provider performs a complete foot exam at least annually or more often if you have foot problems. Report any cuts, sores, or bruises to your health care provider immediately. Contact a health care provider if:  You have an injury that is not healing.  You have cuts or breaks in the skin.  You have an ingrown nail.  You notice redness on your legs or feet.  You feel burning or tingling in your legs or feet.  You have pain or cramps in your legs  and feet.  Your legs or feet are numb.  Your feet always feel cold. Get help right away if:  There is increasing redness, swelling, or pain in or around a wound.  There is a red line that goes up your leg.  Pus is coming from a wound.  You develop a fever or as directed by your health care provider.  You notice a bad smell coming from an ulcer or wound. This information is not intended to replace advice given to you by your health care provider. Make sure you discuss any questions you have with your health care provider. Document Released: 06/27/2000 Document Revised: 12/06/2015 Document Reviewed: 12/07/2012 Elsevier Interactive Patient Education  2017 [employer].

## 2017-01-06 NOTE — Progress Notes (Signed)
Patient ID: Logan Bean, male   DOB: 1921-05-28, 83 y.o.   MRN: [id number]    Subjective: This patient presents today complaining of thickened and elongated toenails which are uncomfortable when walking wearing shoes and requests toenail debridement.  Objective: Orientated 3 DP and PT pulses 1/4 bilaterally Capillary reflex immediate bilaterally Hammertoes 2-5 bilaterally Keratoses distal second left toe No open skin lesions bilaterally Atrophic kin with absent hair growth bilaterally The toenails are extremely elongated, brittle, deformed, discolored and tender direct palpation 6-10 Hammertoe 2-5 bilaterally Manual motor testing dorsi flexion, plantar flexion 5/5 bilaterally Patient walks slowly with walker  Assessment: symptomatic onychomycoses 6-10 Type II diabetic with peripheral arterial disease  Plan: Debridement toenails 6-10 mechanically with slight bleeding distal fifth left toe, treated with topical antibiotic ointment and Band-Aid. Patient instructed removed Band-Aid 1-3 days and continue applying topical antibiotic ointment and Band-Aid until a scab forms Debrided keratoses 1 without any bleeding  Reappoint 3 months

## 2017-01-29 DIAGNOSIS — M1711 Unilateral primary osteoarthritis, right knee: Secondary | ICD-10-CM | POA: Diagnosis not present

## 2017-01-29 DIAGNOSIS — M1712 Unilateral primary osteoarthritis, left knee: Secondary | ICD-10-CM | POA: Diagnosis not present

## 2017-01-29 DIAGNOSIS — M170 Bilateral primary osteoarthritis of knee: Secondary | ICD-10-CM | POA: Diagnosis not present

## 2017-01-30 ENCOUNTER — Ambulatory Visit (INDEPENDENT_AMBULATORY_CARE_PROVIDER_SITE_OTHER): Payer: Medicare Other | Admitting: Nurse Practitioner

## 2017-01-30 ENCOUNTER — Other Ambulatory Visit: Payer: Self-pay | Admitting: Nurse Practitioner

## 2017-01-30 ENCOUNTER — Encounter: Payer: Self-pay | Admitting: Nurse Practitioner

## 2017-01-30 ENCOUNTER — Ambulatory Visit (HOSPITAL_COMMUNITY)
Admission: RE | Admit: 2017-01-30 | Discharge: 2017-01-30 | Disposition: A | Discharge status: Home / Self Care | Payer: Medicare Other | Source: Ambulatory Visit | Attending: Nurse Practitioner | Admitting: Nurse Practitioner

## 2017-01-30 ENCOUNTER — Encounter: Payer: Self-pay | Admitting: Vascular Surgery

## 2017-01-30 VITALS — BP 118/60 | HR 99 | Temp 97.9°F | Wt 135.0 lb

## 2017-01-30 DIAGNOSIS — I8392 Asymptomatic varicose veins of left lower extremity: Secondary | ICD-10-CM | POA: Insufficient documentation

## 2017-01-30 DIAGNOSIS — L989 Disorder of the skin and subcutaneous tissue, unspecified: Secondary | ICD-10-CM

## 2017-01-30 DIAGNOSIS — R634 Abnormal weight loss: Secondary | ICD-10-CM | POA: Diagnosis not present

## 2017-01-30 DIAGNOSIS — I83892 Varicose veins of left lower extremities with other complications: Secondary | ICD-10-CM

## 2017-01-30 DIAGNOSIS — R600 Localized edema: Secondary | ICD-10-CM | POA: Diagnosis not present

## 2017-01-30 LAB — CBC WITH DIFFERENTIAL/PLATELET
BASOS ABS: 0 {cells}/uL (ref 0–200)
Basophils Relative: 0 %
Eosinophils Absolute: 0 cells/uL — ABNORMAL LOW (ref 15–500)
Eosinophils Relative: 0 %
HCT: 42.1 % (ref 38.5–50.0)
HEMOGLOBIN: 13.4 g/dL (ref 13.2–17.1)
LYMPHS ABS: 592 {cells}/uL — AB (ref 850–3900)
LYMPHS PCT: 8 %
MCH: 29.1 pg (ref 27.0–33.0)
MCHC: 31.8 g/dL — ABNORMAL LOW (ref 32.0–36.0)
MCV: 91.5 fL (ref 80.0–100.0)
MONO ABS: 296 {cells}/uL (ref 200–950)
MPV: 10.3 fL (ref 7.5–12.5)
Monocytes Relative: 4 %
NEUTROS PCT: 88 %
Neutro Abs: 6512 cells/uL (ref 1500–7800)
PLATELETS: 246 10*3/uL (ref 140–400)
RBC: 4.6 MIL/uL (ref 4.20–5.80)
RDW: 14.8 % (ref 11.0–15.0)
WBC: 7.4 10*3/uL (ref 3.8–10.8)

## 2017-01-30 NOTE — Progress Notes (Signed)
Careteam: Patient Care Team: Lauree Chandler, NP as PCP - General (Nurse Practitioner)  Advanced Directive information Does Patient Have a Medical Advance Directive?: Yes, Type of Advance Directive: Healthcare Power of Attorney  Allergies  Allergen Reactions  . Morphine And Related Nausea And Vomiting  . Oxycodone Nausea And Vomiting    Chief Complaint  Patient presents with  . Acute Visit    swelling in left leg     HPI: Patient is a 83 y.o. male seen in the office today due to swelling in left leg. hx of HTN, CHF, chronic afib, GERD, goiter, hyperthyroidism, DM-2, and CKD-Stage 3.  Pt went to orthopedics yesterday for knee injection and noticed swelling to left leg and wanting him evaluated. Pt reports leg has progressively getting worse for months and there has not been an acute change.  Legs swell all the time but there is a bulge of fluid that is new (in the last month) Now it is very tender- always a little touchy- this again has progressive worsen.  No fever.  Pt taking eliquis 5 mg by mouth twice daily for a fib.  Chronic color changes to left leg.   Review of Systems:  Review of Systems  Constitutional: Positive for weight loss. Negative for malaise/fatigue.  Respiratory: Negative for cough and shortness of breath.   Cardiovascular: Positive for leg swelling. Negative for chest pain.  Gastrointestinal: Negative for abdominal pain and diarrhea.       Hx of c diff  Genitourinary: Negative for dysuria.  Musculoskeletal: Negative for falls.  Skin: Positive for itching (crushy lesions to arm that he has been picking off).       Stasis dermatitis; chronic color change to LLE  Neurological: Negative for dizziness and weakness.  Psychiatric/Behavioral: Positive for memory loss.    Past Medical History:  Diagnosis Date  . Arthritis    left knee  . Diabetes mellitus    diet controlled, pt does not check cbg  . Diaphragmatic hernia without mention of  obstruction or gangrene   . Dizziness and giddiness   . Edema   . H/O hiatal hernia   . Hypercholesteremia   . Hypertension   . Left rotator cuff tear Jul 12, 2012  . Stomach ulcer    years  . Unspecified hereditary and idiopathic peripheral neuropathy   . Unspecified vitamin D deficiency   . Varicose veins    both legs   Past Surgical History:  Procedure Laterality Date  . CARDIOVERSION N/A 02/22/2013   Procedure: CARDIOVERSION;  Surgeon: Darlin Coco, MD;  Location: [facility] ENDOSCOPY;  Service: Cardiovascular;  Laterality: N/A;  . Esopagus strectching  2003 and 3 yrs ago  . MASTECTOMY     bilateral  . ORIF ANKLE FRACTURE Right 09/20/2012   Procedure: OPEN REDUCTION INTERNAL FIXATION (ORIF) ANKLE FRACTURE;  Surgeon: Johnn Hai, MD;  Location: [facility] ORS;  Service: Orthopedics;  Laterality: Right;  . REVERSE SHOULDER ARTHROPLASTY Right 09/17/2012   Procedure: REVERSE SHOULDER ARTHROPLASTY;  Surgeon: Augustin Schooling, MD;  Location: [place];  Service: Orthopedics;  Laterality: Right;  . SHOULDER OPEN ROTATOR CUFF REPAIR  11/05/2011   Procedure: ROTATOR CUFF REPAIR SHOULDER OPEN;  Surgeon: Tobi Bastos, MD;  Location: [facility] ORS;  Service: Orthopedics;  Laterality: Left;  Left Shoulder Rotator Cuff Repair Open with Graft and Anchors  . TONSILLECTOMY  age 81   Social History:   reports that he quit smoking about 20 years ago. His smoking use  included Pipe and Cigarettes. He quit after 50.00 years of use. He has never used smokeless tobacco. He reports that he drinks alcohol. He reports that he does not use drugs.  Family History  Problem Relation Age of Onset  . Parkinson's disease Mother   . Hip fracture Mother   . Heart disease Mother   . Alzheimer's disease Mother   . Heart disease Father   . Parkinson's disease Sister   . Heart disease Brother     Medications: Patient's Medications  New Prescriptions   No medications on file  Previous Medications   ACETAMINOPHEN (TYLENOL) 325  MG TABLET    Take 2 tablets (650 mg total) by mouth every 6 (six) hours as needed for mild pain (or Fever >/= 101).   CALCIUM CARBONATE-VITAMIN D 600-200 MG-UNIT TABS    Take 1 tablet by mouth 2 (two) times daily. Take 1 tablet daily for vitamin supplement.   ELIQUIS 5 MG TABS TABLET    TAKE 1 TABLET TWICE DAILY.   FERROUS SULFATE 325 (65 FE) MG TABLET    Takes one tablet by mouth every three days   FUROSEMIDE (LASIX) 20 MG TABLET    TAKE (1/2) TABLET ONCE A DAY FOR EDEMA, HYPERTENSION.   GLUCOSAMINE-CHONDROIT-VIT C-MN (GLUCOSAMINE 1500 COMPLEX) CAPS    Take one tablet once a day   GLUCOSE BLOOD (TRUETEST TEST) TEST STRIP    E11.22 check blood sugar once daily   LISINOPRIL (PRINIVIL,ZESTRIL) 10 MG TABLET    Take 1 tablet (10 mg total) by mouth daily. for blood pressure   METFORMIN (GLUCOPHAGE) 500 MG TABLET    Take 1 tablet (500 mg total) by mouth 2 (two) times daily with a meal.   METOPROLOL TARTRATE (LOPRESSOR) 25 MG TABLET    TAKE 1 TABLET TWICE DAILY.   MULTIPLE VITAMIN (MULITIVITAMIN WITH MINERALS) TABS    Take 1 tablet by mouth daily.    OMEGA-3 FATTY ACIDS (FISH OIL) 1000 MG CAPS    Take 1,000 mg by mouth daily.    SIMVASTATIN (ZOCOR) 10 MG TABLET    TAKE ONE TABLET AT BEDTIME.   TRUEPLUS LANCETS 30G MISC    Use to check blood sugar once daily. Dx E11.9  Modified Medications   No medications on file  Discontinued Medications   GLUCOSE MONITORING KIT (FREESTYLE) MONITORING KIT    1 each by Does not apply route as needed for other. Take blood sugar once daily.     Physical Exam:  Vitals:   01/30/17 0952  BP: 118/60  Pulse: 99  Temp: 97.9 F (36.6 C)  TempSrc: Oral  SpO2: 96%  Weight: 135 lb (61.2 kg)   Body mass index is 21.79 kg/m.  Physical Exam  Constitutional: He is oriented to person, place, and time. He appears well-developed and well-nourished.  Frail, pleasant, A&O  HENT:  Head: Normocephalic and atraumatic.  Eyes: Pupils are equal, round, and reactive to light.  Conjunctivae and EOM are normal.  Cardiovascular: Normal rate, normal heart sounds and intact distal pulses.   Afib, irregular  Pulmonary/Chest: Effort normal and breath sounds normal. No respiratory distress. He has no wheezes. He exhibits no tenderness.  Abdominal: Soft. Bowel sounds are normal. He exhibits no distension. There is no tenderness.  Musculoskeletal: He exhibits edema and tenderness.  Tenderness noted to bilateral LE, worse on left. Left leg with worsening edema than right Left medial calf with increased pocket of edema, thin fragile skin. Brownish skin discoloration anteriorly with slight redness. No  warmth noted  Neurological: He is alert and oriented to person, place, and time.  Skin: Skin is warm and dry. No erythema.  Psychiatric: He has a normal mood and affect.    Labs reviewed: Basic Metabolic Panel:  Recent Labs  02/14/16 1142 05/20/16 1135 11/18/16 1003  NA  --  140 141  K  --  4.8 4.9  CL  --  105 104  CO2  --  26 28  GLUCOSE  --  115* 184*  BUN  --  37* 38*  CREATININE  --  1.04 1.07  CALCIUM  --  10.0 9.5  TSH 0.32* 0.37* 0.40   Liver Function Tests:  Recent Labs  05/20/16 1135 11/18/16 1003  AST 13 14  ALT 13 17  ALKPHOS 73 69  BILITOT 0.8 1.1  PROT 5.7* 5.9*  ALBUMIN 3.7 3.7   No results for input(s): LIPASE, AMYLASE in the last 8760 hours. No results for input(s): AMMONIA in the last 8760 hours. CBC:  Recent Labs  05/20/16 1135 11/18/16 1003  WBC 9.2 10.6  NEUTROABS 6,992 8,692*  HGB 12.9* 14.4  HCT 40.9 45.6  MCV 81.0 88.2  PLT 243 235   Lipid Panel:  Recent Labs  11/18/16 1003  CHOL 184  HDL 53  LDLCALC 107*  TRIG 119  CHOLHDL 3.5   TSH:  Recent Labs  02/14/16 1142 05/20/16 1135 11/18/16 1003  TSH 0.32* 0.37* 0.40   A1C: Lab Results  Component Value Date   HGBA1C 8.7 (H) 11/18/2016     Assessment/Plan 1. Leg edema, left -pt with area of fluid collection under skin; hx of weight loss with low total  protein and albumin of 3.7 in may.  Due to increased redness- pt reports this has been chronic over the last month however due to increase risk and possibility of cellulitis will get CBC, pt does not wish to start antibiotic at this time due to hx of c diff  -hx of edema due to venous insuffiencey  -has not been able to wear compression hose.  -to elevate legs as much as possible.  -increase protein intake.  - VAS [place] LOWER EXTREMITY VENOUS (DVT);  Rule out DVT  -strict precautions given to pt and POA if leg worsen to go to ED  2. Loss of weight Stable at this time, to increase protein in diet.   3. Skin lesion -encouraged dermatology follow up. POA states they will make appt with his dermatologist.   Carlos American. Harle Battiest  [facility] [phone] 8 am - 5 pm) [phone] (after hours)

## 2017-01-30 NOTE — Patient Instructions (Addendum)
To increase protein intake- 3 meals a day and would recommend ensure daily between meals  To elevate legs as much as possible throughout the day  Go to go the Emergency room if leg gets significantly worse, tender, red or fever

## 2017-02-02 ENCOUNTER — Telehealth: Payer: Self-pay | Admitting: *Deleted

## 2017-02-02 ENCOUNTER — Telehealth: Payer: Self-pay

## 2017-02-02 ENCOUNTER — Encounter: Payer: Self-pay | Admitting: Family

## 2017-02-02 DIAGNOSIS — I8390 Asymptomatic varicose veins of unspecified lower extremity: Secondary | ICD-10-CM

## 2017-02-02 NOTE — Telephone Encounter (Signed)
Elevate feet at rest.  Ideally should be using compression hose, but notes indicate lack of success with wearing these historically.

## 2017-02-02 NOTE — Telephone Encounter (Signed)
I spoke with patient and he verbalized understanding.

## 2017-02-02 NOTE — Telephone Encounter (Signed)
Per Janett Billow-- refer to Vascular and Vein due to results from Venous Doppler from [facility] and place report for Dr. Mariea Clonts to review. Placed report in Dr. Cyndi Lennert folder. Referral placed.  Impression: No deep or superficial thrombus noted in the vessels listed. Varicosities noted in the left calf in area of patient's pain and swelling.

## 2017-02-02 NOTE — Telephone Encounter (Signed)
Message left on clinical intake voicemail:   Please call with vascular results from  I called patient and informed him no DVT, further instructions to come from covering doctor (Dr.Reed)  Refer to phone note dated  7/23/8 from [place]

## 2017-02-03 ENCOUNTER — Encounter (HOSPITAL_COMMUNITY): Payer: Medicare Other

## 2017-02-09 ENCOUNTER — Encounter: Payer: Self-pay | Admitting: Vascular Surgery

## 2017-02-10 ENCOUNTER — Encounter: Payer: Medicare Other | Admitting: Vascular Surgery

## 2017-02-16 IMAGING — DX DG CHEST 2V
2 series · 2 of 2 positions shown · non-contrast
Comparison: May 19, 2014

CLINICAL DATA: Status post fall.

EXAM:
CHEST  2 VIEW

[t chest supine]
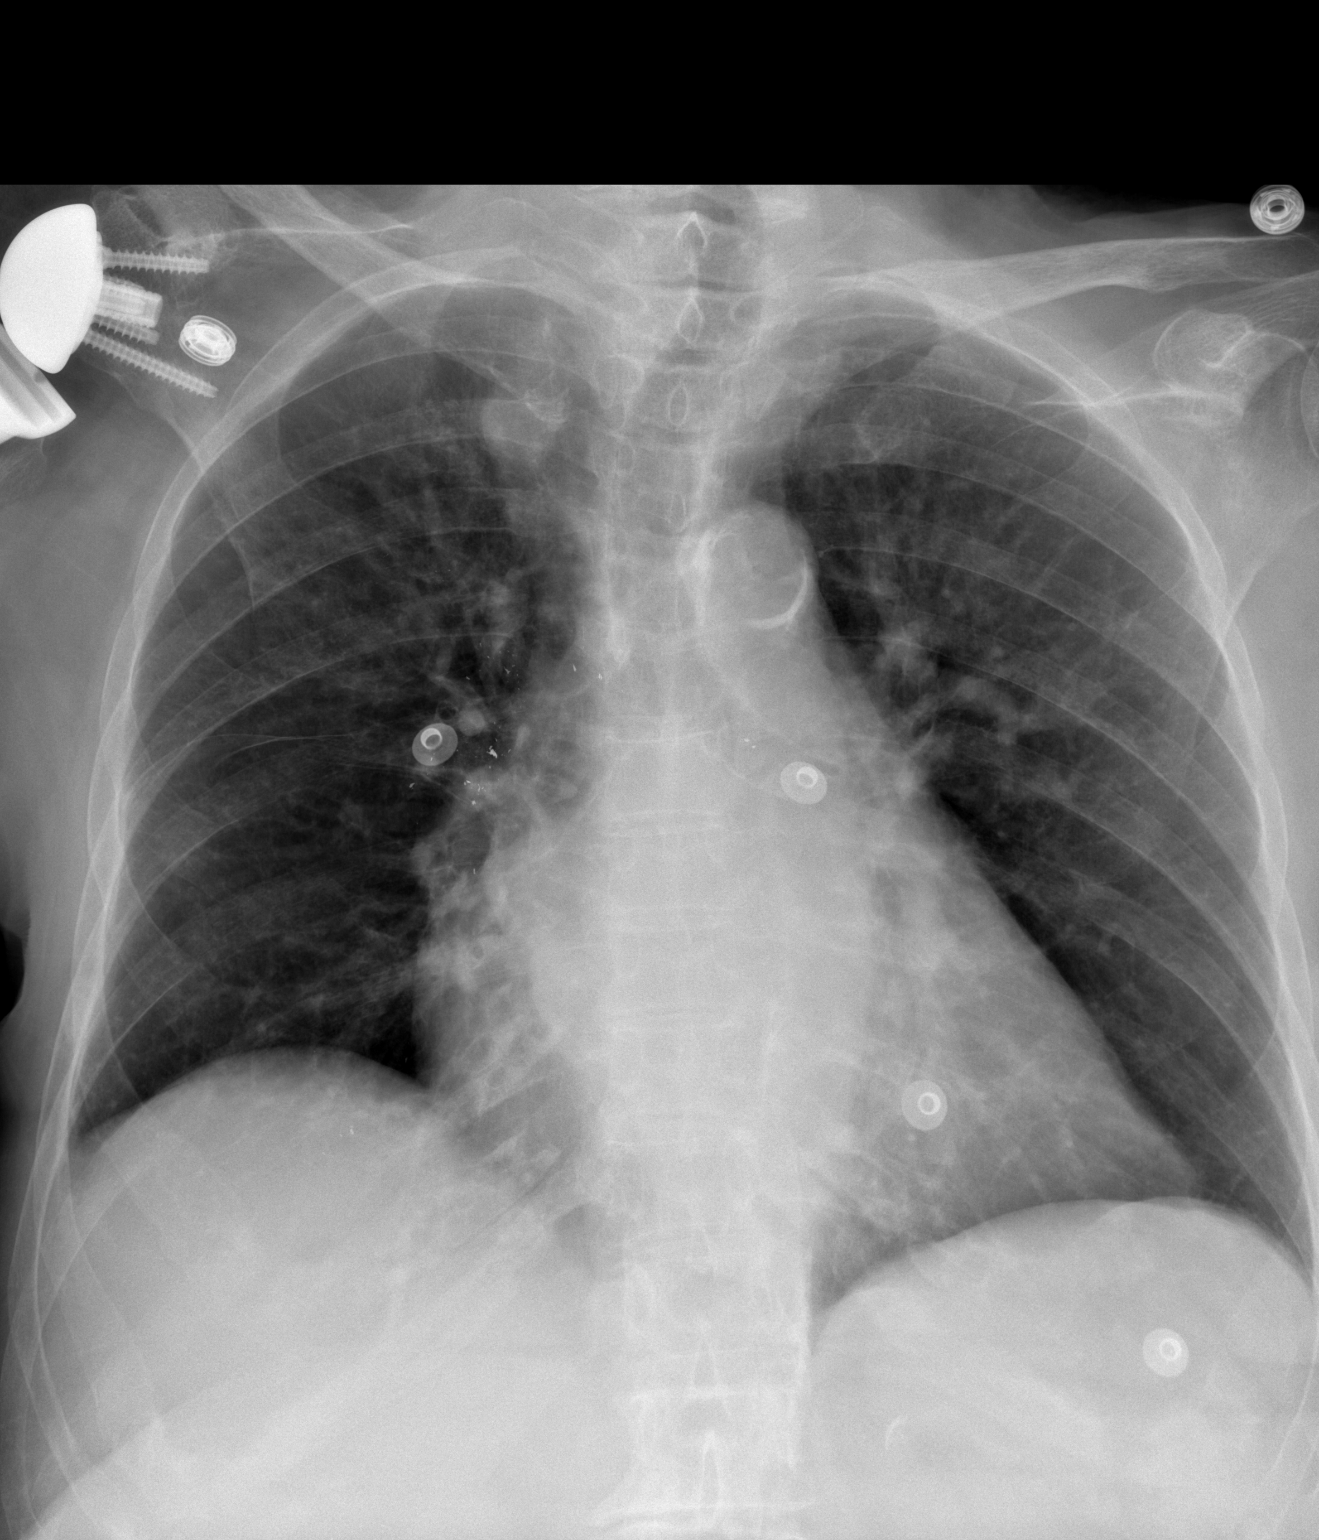

[w chest lat]
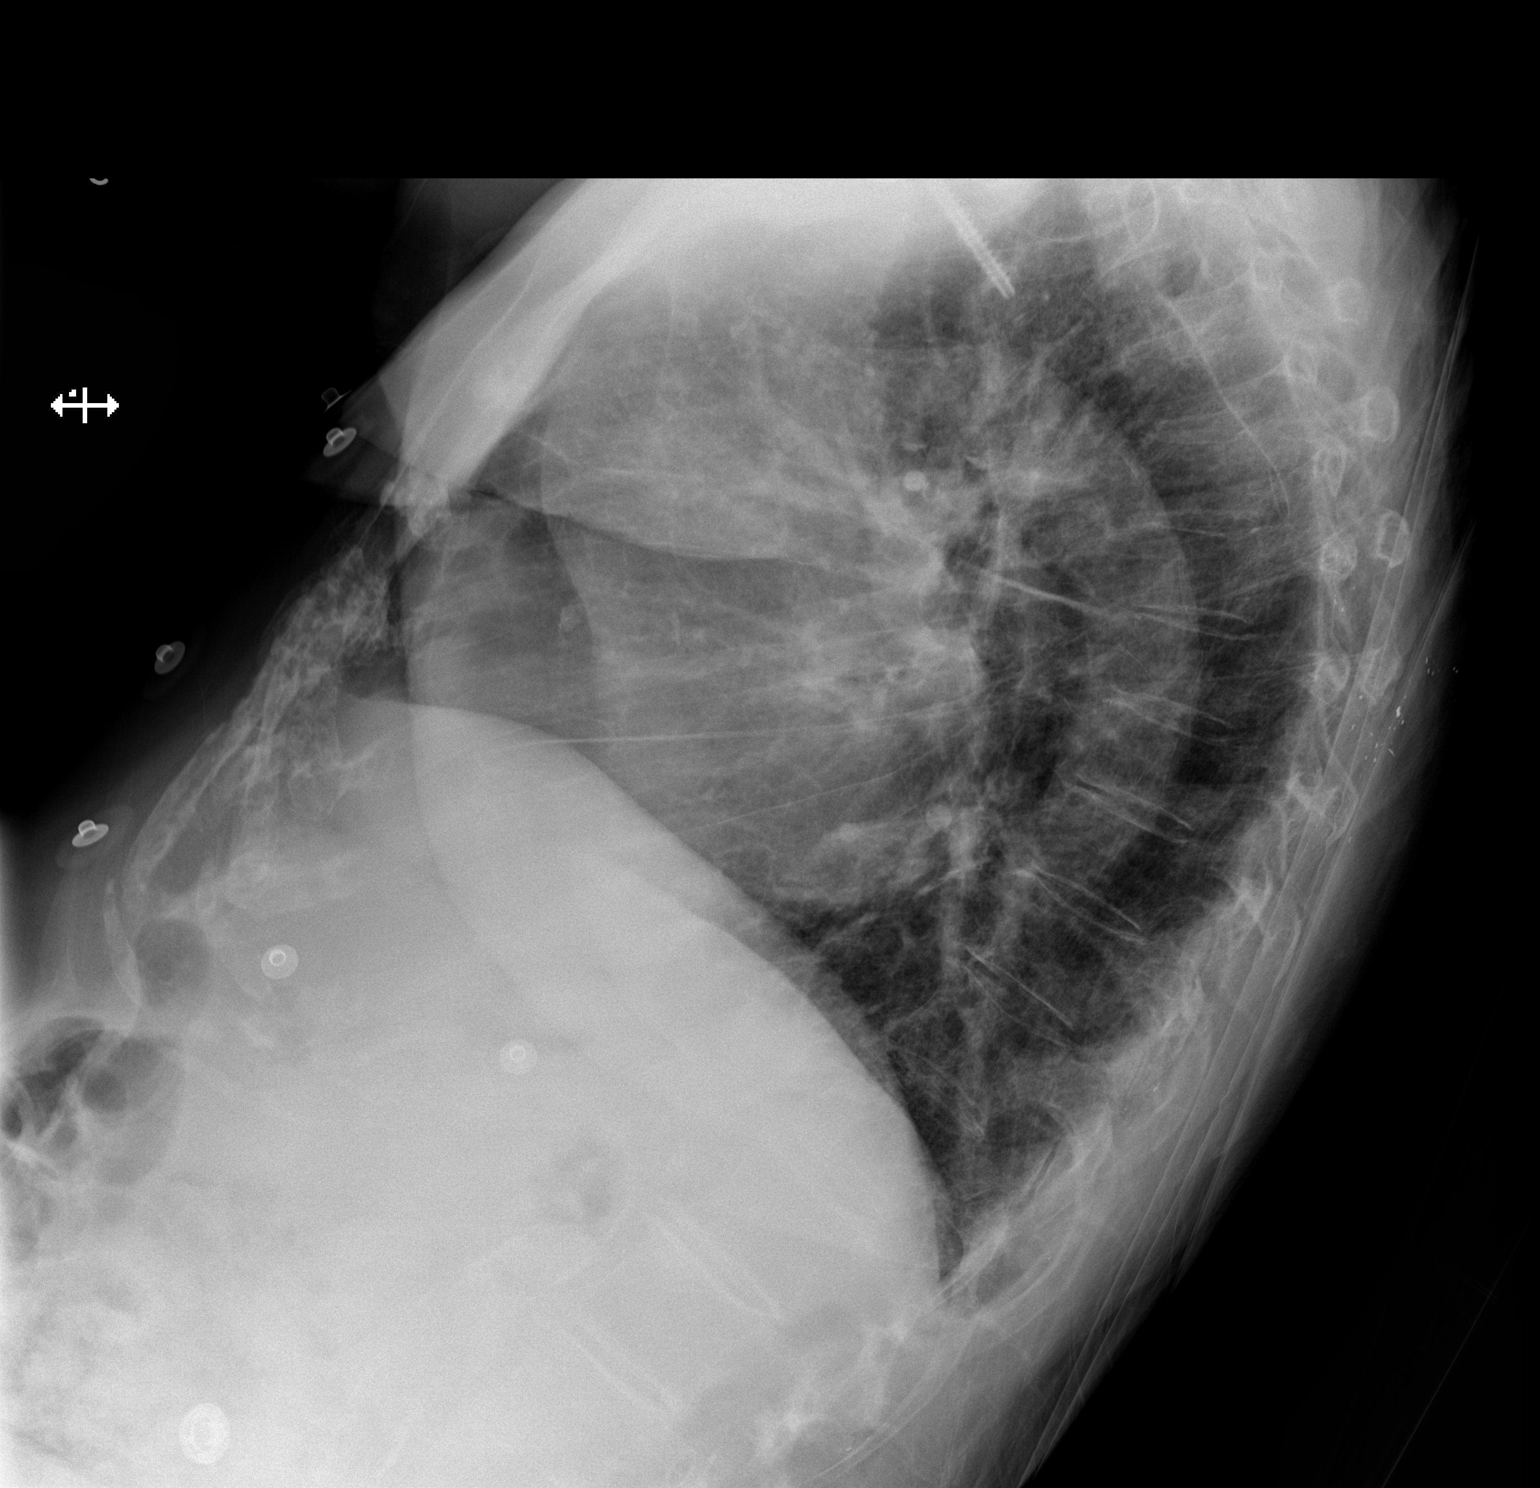

[2 of 2 positions shown; findings below may reference images not displayed]

FINDINGS: The heart size and mediastinal contours are stable. The heart size
is enlarged. There is no focal infiltrate, pulmonary edema, or
pleural effusion. There is a question 1.7 cm nodule in the medial
right upper lobe. The visualized skeletal structures are stable.
IMPRESSION: No active cardiopulmonary disease. Question 1.7 nodule in the medial
right upper lobe. Further evaluation with chest CT on outpatient
basis is recommended.

## 2017-02-16 IMAGING — DX DG HUMERUS 2V *R*
2 series · 2 of 2 positions shown · non-contrast
Comparison: 09/20/2012 radiographs

CLINICAL DATA: [AGE] male with acute right arm pain following
fall today. Initial encounter.

EXAM:
RIGHT HUMERUS - 2+ VIEW

[x humerus ap right]
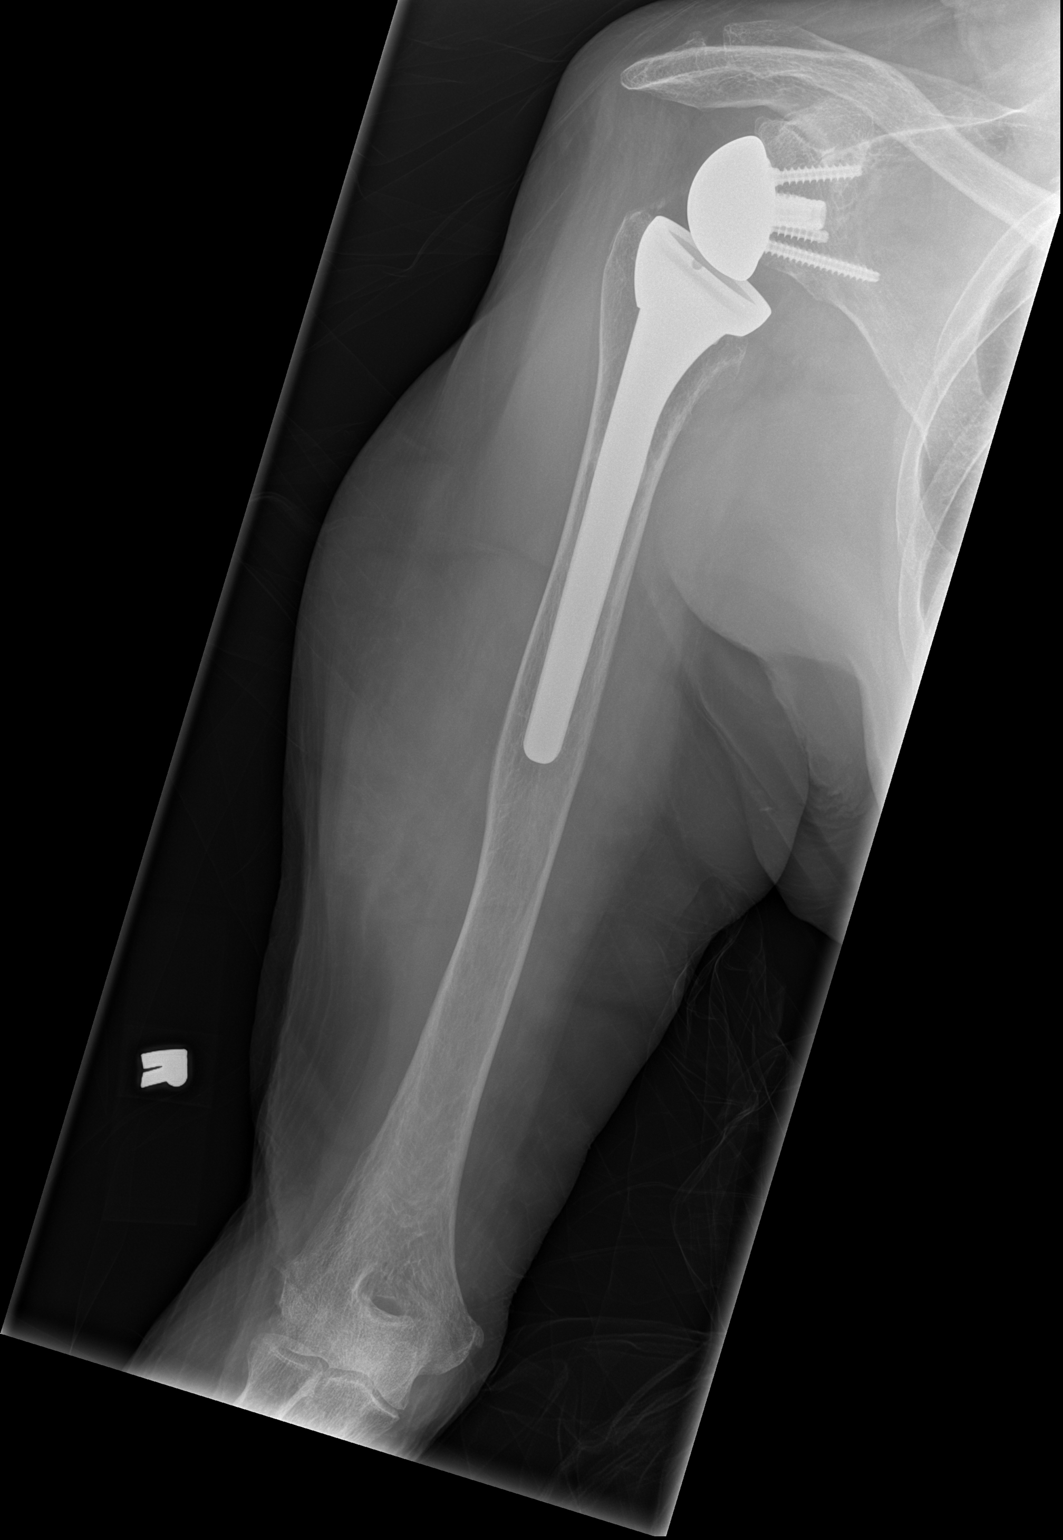

[x humerus lat right]
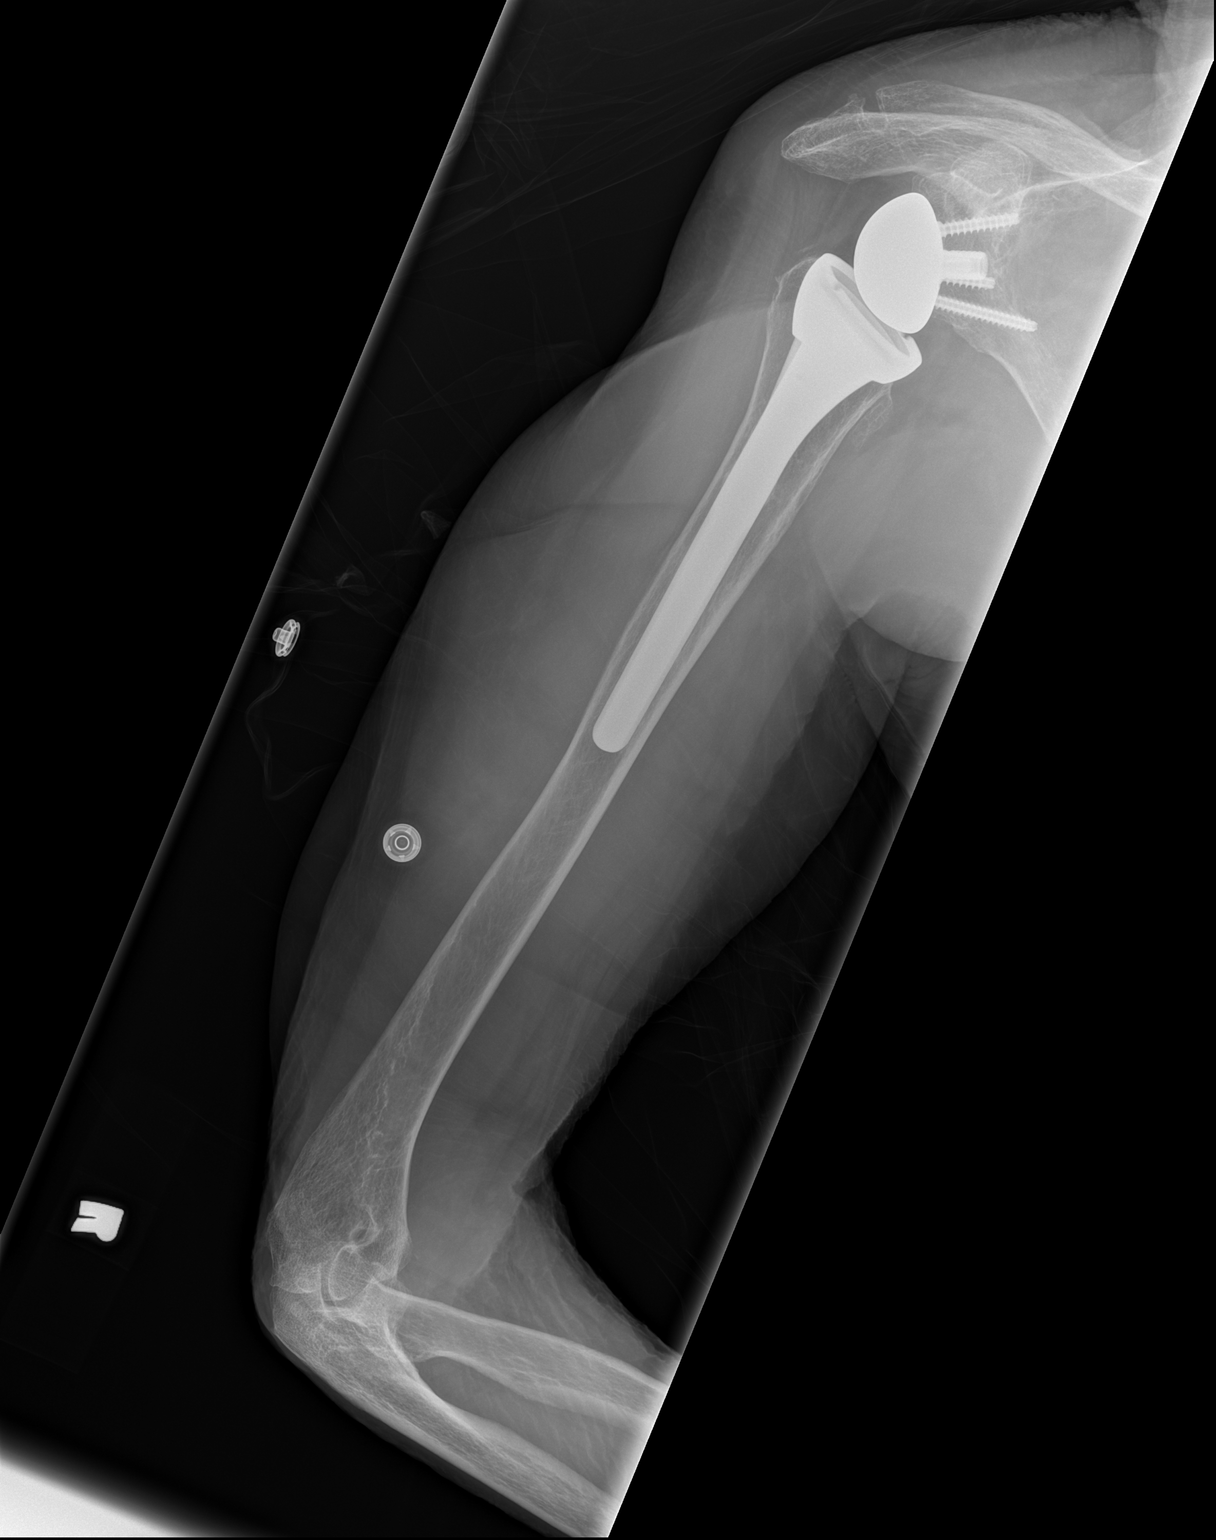

[2 of 2 positions shown; findings below may reference images not displayed]

FINDINGS: Right shoulder arthroplasty again noted without evidence of
subluxation or dislocation.

No acute fracture or complicating hardware features noted.

Soft tissue swelling is present.

No focal bony lesions are identified.
IMPRESSION: Soft tissue swelling without acute bony abnormality.

Right shoulder arthroplasty without complicating features.

## 2017-02-16 IMAGING — CT CT HEAD W/O CM
1 series · 15 of 30 positions shown, 19 images · non-contrast
Comparison: 05/18/2014.

CLINICAL DATA: [AGE] male who fell. Lacerations, hematoma,
teeth knocked out, neck pain. Initial encounter.

EXAM:
CT HEAD WITHOUT CONTRAST
CT CERVICAL SPINE WITHOUT CONTRAST
TECHNIQUE: Multidetector CT imaging of the head and cervical spine was
performed following the standard protocol without intravenous
contrast. Multiplanar CT image reconstructions of the cervical spine
were also generated.

[Series 2: head 5.0 h30s · axial · 0.47mm/px · z∈[+1143,+1293]mm · 15 of 34 slices shown, 19 images]
[im 2/34  brain]
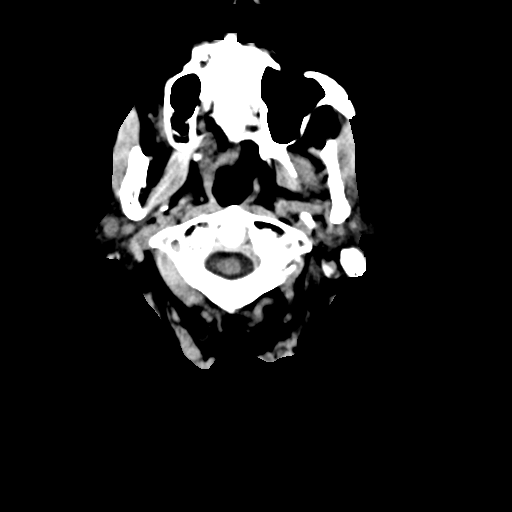
[im 2/34  bone]
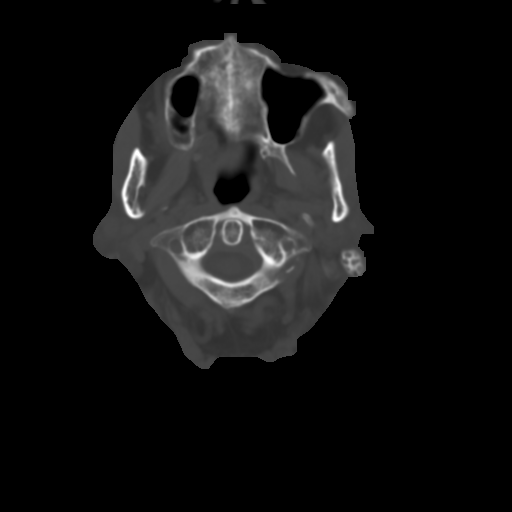
[im 4/34  brain]
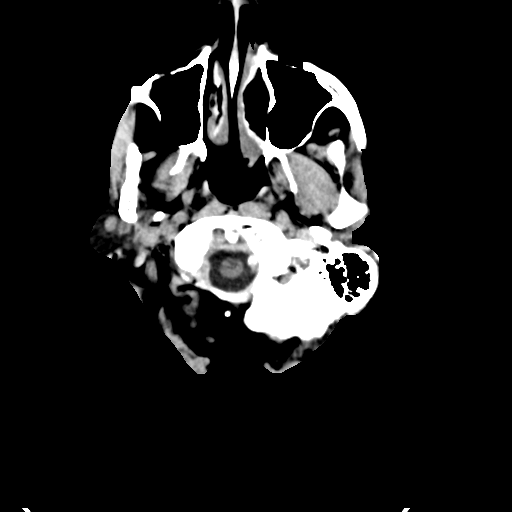
[im 6/34  brain]
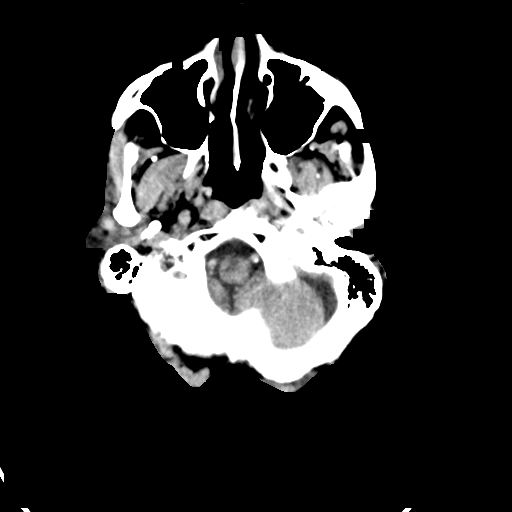
[im 8/34  brain]
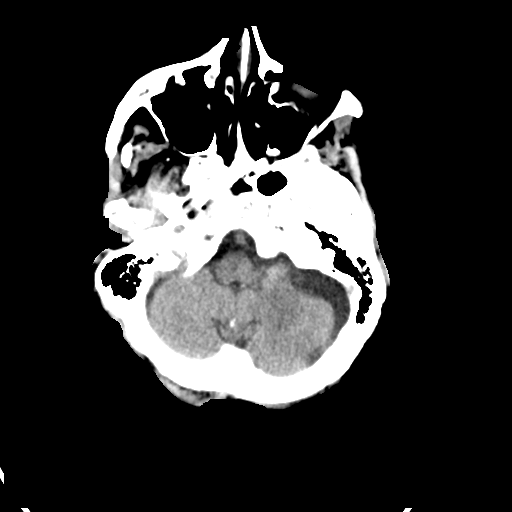
[im 11/34  brain]
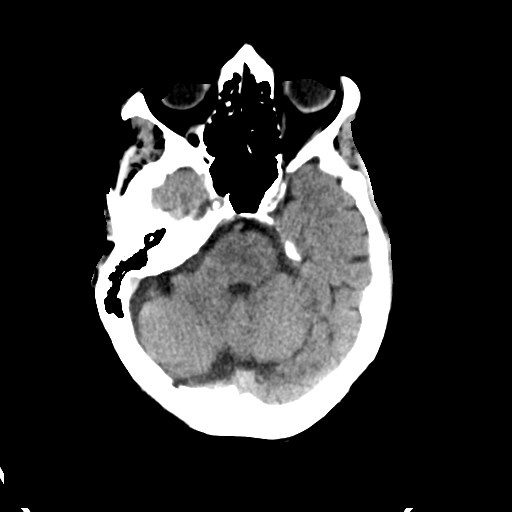
[im 11/34  bone]
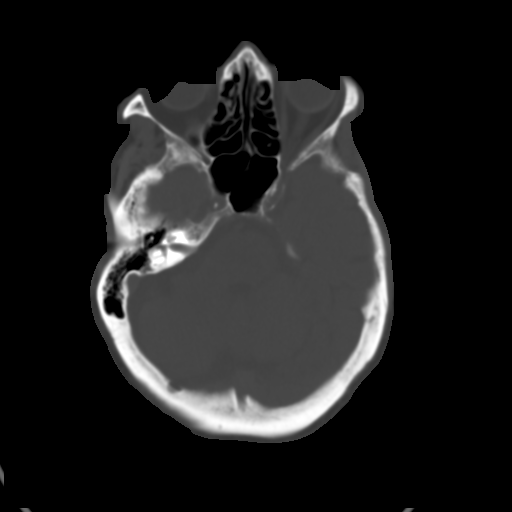
[im 13/34  brain]
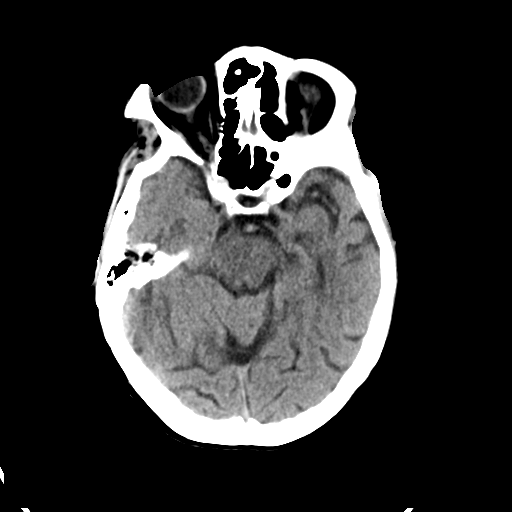
[im 15/34  brain]
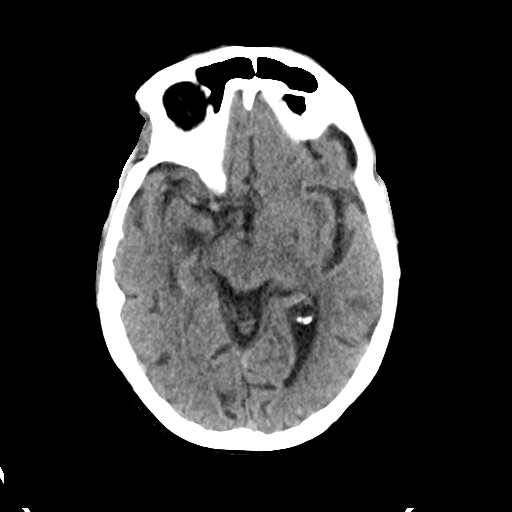
[im 18/34  brain]
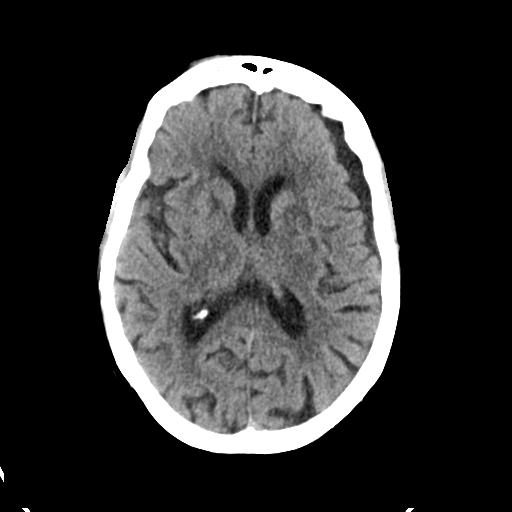
[im 19/34  brain]
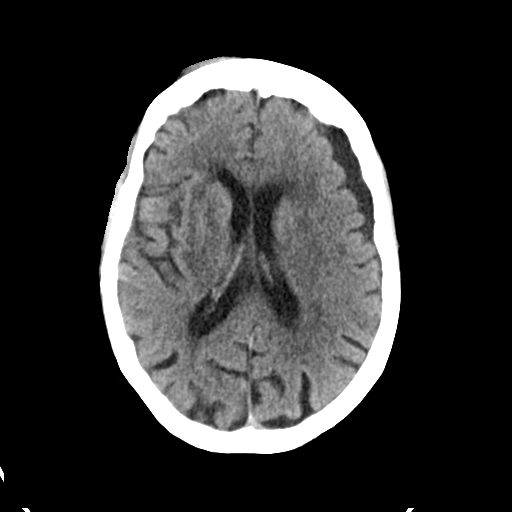
[im 19/34  bone]
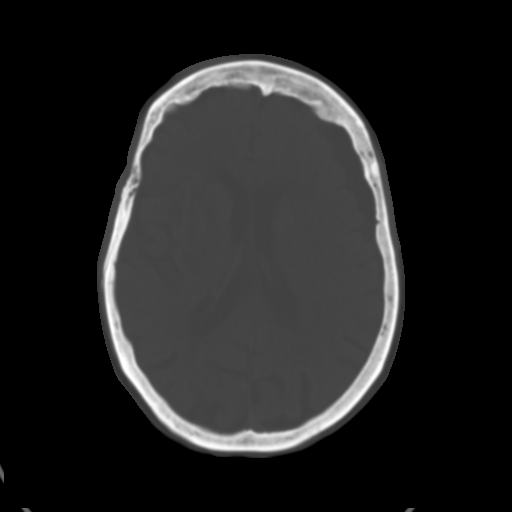
[im 21/34  brain]
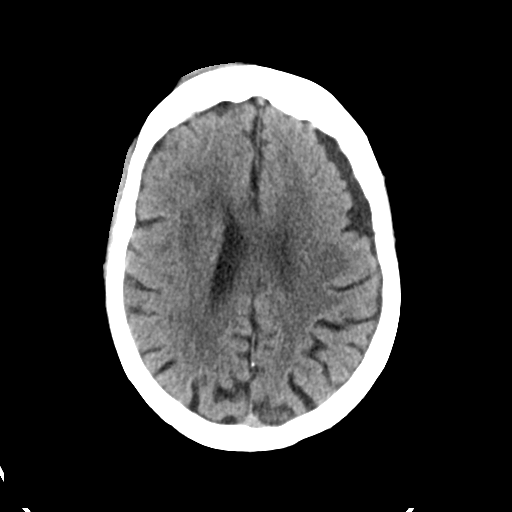
[im 23/34  brain]
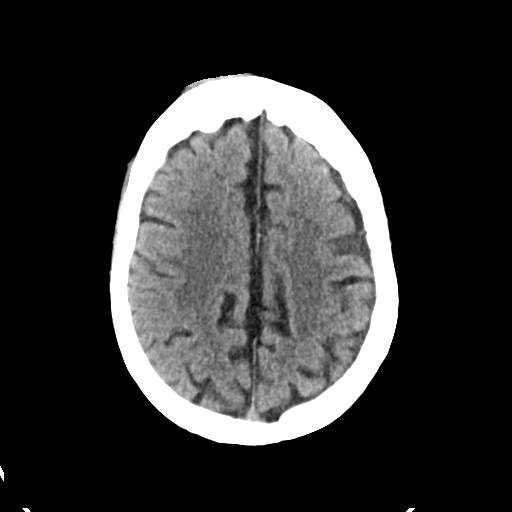
[im 26/34  brain]
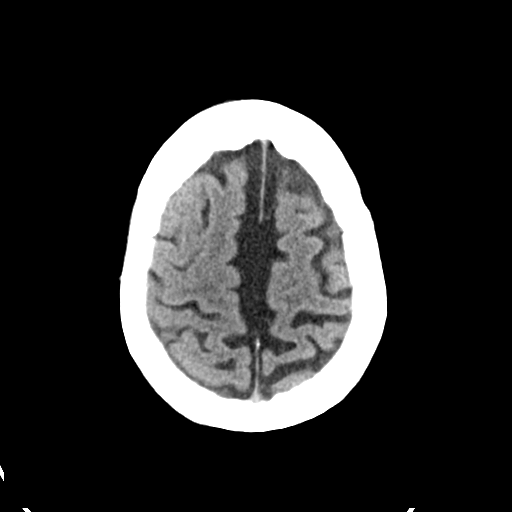
[im 28/34  brain]
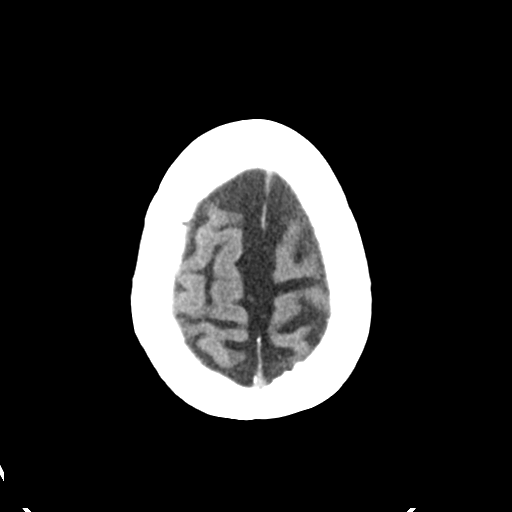
[im 28/34  bone]
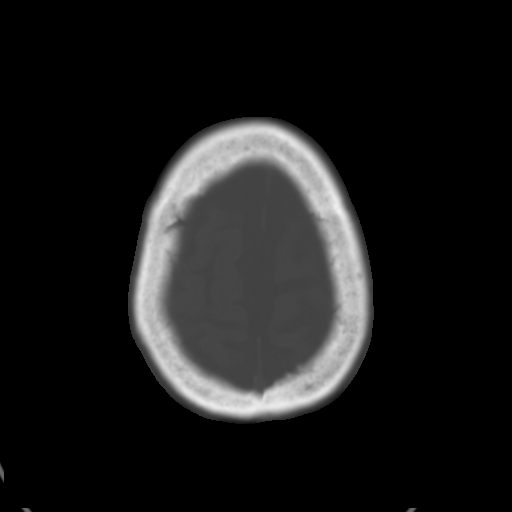
[im 30/34  brain]
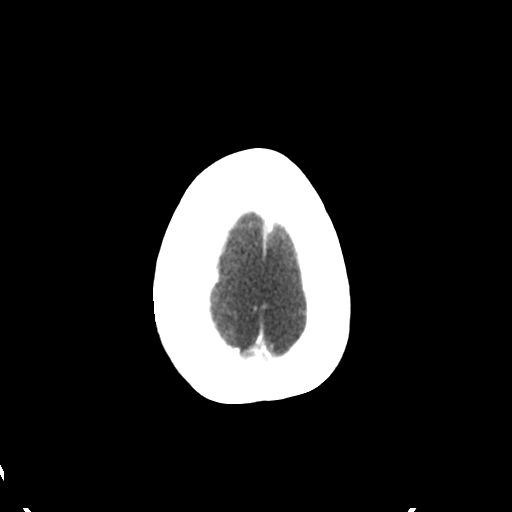
[im 32/34  brain]
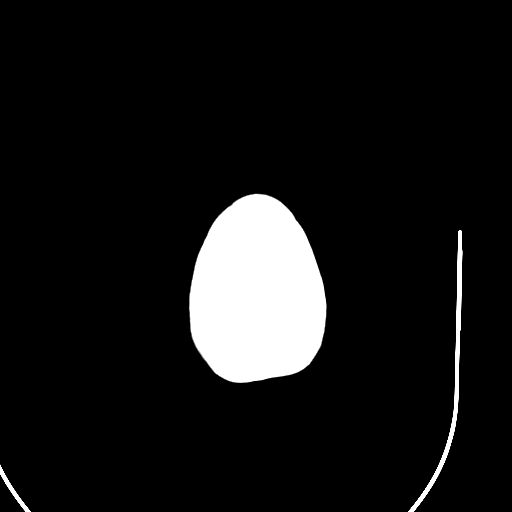

[15 of 30 positions shown; findings below may reference images not displayed]

FINDINGS: CT HEAD FINDINGS

Visualized paranasal sinuses and mastoids are clear. Right forehead
scalp hematoma measuring up to 12 mm in thickness. Underlying right
frontal bone intact. Hyperostosis frontalis No acute osseous
abnormality identified.

Calcified atherosclerosis at the skull base. Cerebral volume has
mildly diminished since 3303. No ventriculomegaly. Asymmetric CSF
along the left frontal convexity is incidentally noted and stable,
perhaps a small hygroma but appears inconsequential. No midline
shift or intracranial mass effect. Heterogeneous hypodensity in the
deep gray matter nuclei compatible with chronic lacunar infarcts
appear stable. No acute intracranial hemorrhage identified. No
cortically based acute infarct identified. No suspicious
intracranial vascular hyperdensity.

CT CERVICAL SPINE FINDINGS

Osteopenia. Chronic multilevel mild spondylolisthesis in the
cervical spine most pronounced at C3-C4 (retrolisthesis). Widespread
disc space loss. Visualized skull base is intact. No
atlanto-occipital dissociation. Stable cervicothoracic junction
alignment. Stable bilateral posterior element alignment. No cervical
spine fracture identified. Negative lung apices. Grossly intact
visualized upper thoracic levels. Chronic multifactorial
degenerative spinal stenosis at C3-C4. Thyromegaly on the right, no
discrete thyroid nodule. Mild for age calcified atherosclerosis.
Otherwise negative noncontrast paraspinal soft tissues.
IMPRESSION: 1. Forehead soft tissue injury without underlying fracture.
2. No acute intracranial abnormality. Chronic small vessel disease.
3. No acute fracture or listhesis identified in the cervical spine.
Ligamentous injury is not excluded.
4. Chronic cervical spine spondylolisthesis with degenerative C3-C4
spinal stenosis.

## 2017-02-19 ENCOUNTER — Encounter: Payer: Self-pay | Admitting: Nurse Practitioner

## 2017-02-19 ENCOUNTER — Ambulatory Visit (INDEPENDENT_AMBULATORY_CARE_PROVIDER_SITE_OTHER): Payer: Medicare Other | Admitting: Nurse Practitioner

## 2017-02-19 VITALS — BP 106/62 | HR 90 | Temp 98.2°F | Resp 18 | Ht 66.0 in | Wt 131.2 lb

## 2017-02-19 DIAGNOSIS — E1122 Type 2 diabetes mellitus with diabetic chronic kidney disease: Secondary | ICD-10-CM | POA: Diagnosis not present

## 2017-02-19 DIAGNOSIS — R634 Abnormal weight loss: Secondary | ICD-10-CM

## 2017-02-19 DIAGNOSIS — I482 Chronic atrial fibrillation, unspecified: Secondary | ICD-10-CM

## 2017-02-19 DIAGNOSIS — I8390 Asymptomatic varicose veins of unspecified lower extremity: Secondary | ICD-10-CM | POA: Diagnosis not present

## 2017-02-19 DIAGNOSIS — L989 Disorder of the skin and subcutaneous tissue, unspecified: Secondary | ICD-10-CM | POA: Diagnosis not present

## 2017-02-19 DIAGNOSIS — N183 Chronic kidney disease, stage 3 unspecified: Secondary | ICD-10-CM

## 2017-02-19 DIAGNOSIS — I10 Essential (primary) hypertension: Secondary | ICD-10-CM

## 2017-02-19 LAB — BASIC METABOLIC PANEL WITH GFR
BUN: 35 mg/dL — ABNORMAL HIGH (ref 7–25)
CHLORIDE: 104 mmol/L (ref 98–110)
CO2: 25 mmol/L (ref 20–32)
Calcium: 9.4 mg/dL (ref 8.6–10.3)
Creat: 0.94 mg/dL (ref 0.70–1.11)
GFR, Est African American: 80 mL/min (ref >=60)
GFR, Est Non African American: 69 mL/min (ref >=60)
Glucose, Bld: 197 mg/dL — ABNORMAL HIGH (ref 65–99)
POTASSIUM: 4.3 mmol/L (ref 3.5–5.3)
SODIUM: 141 mmol/L (ref 135–146)

## 2017-02-19 MED ORDER — APIXABAN 2.5 MG PO TABS: 2.5000 mg | ORAL_TABLET | Freq: Two times a day (BID) | ORAL | 2 refills | Status: DC

## 2017-02-19 NOTE — Progress Notes (Signed)
Careteam: Patient Care Team: Lauree Chandler, NP as PCP - General (Nurse Practitioner)  Advanced Directive information Does Patient Have a Medical Advance Directive?: Yes, Type of Advance Directive: Healthcare Power of Attorney  Allergies  Allergen Reactions  . Morphine And Related Nausea And Vomiting  . Oxycodone Nausea And Vomiting    Chief Complaint  Patient presents with  . Medical Management of Chronic Issues    Pt is being seen for a 3 month routine visit. Pt would like a referral for dermatology.      HPI: Patient is a 83 y.o. male seen in the office today for routine follow up.  Pt was seen in office on 7/20 due to LE  Edema. Doppler negative for DVT but noted to have varicosities and vein and vascular referral place. Also recommended compression hose but he can not tolerate this.  Blood pressure at home in the 130s/60s Appetite is unchanged- sometimes feels like eating more than other times. Has a protein supplement daily No pain Swelling in leg is better this morning- states it will be bad by this afternoon.  conts on iron supplement when he can- can not take regularly due to constipation.    Review of Systems:  Review of Systems  Constitutional: Positive for weight loss. Negative for malaise/fatigue.  Respiratory: Negative for cough and shortness of breath.   Cardiovascular: Positive for leg swelling. Negative for chest pain.  Gastrointestinal: Negative for abdominal pain and diarrhea.       Hx of c diff  Genitourinary: Negative for dysuria.  Musculoskeletal: Negative for falls.  Skin: Positive for itching (crushy lesions to arm that he has been picking off).       Stasis dermatitis; chronic color change to LLE  Neurological: Negative for dizziness and weakness.  Psychiatric/Behavioral: Positive for memory loss.    Past Medical History:  Diagnosis Date  . Arthritis    left knee  . Diabetes mellitus    diet controlled, pt does not check cbg  .  Diaphragmatic hernia without mention of obstruction or gangrene   . Dizziness and giddiness   . Edema   . H/O hiatal hernia   . Hypercholesteremia   . Hypertension   . Left rotator cuff tear Jul 12, 2012  . Stomach ulcer    years  . Unspecified hereditary and idiopathic peripheral neuropathy   . Unspecified vitamin D deficiency   . Varicose veins    both legs   Past Surgical History:  Procedure Laterality Date  . CARDIOVERSION N/A 02/22/2013   Procedure: CARDIOVERSION;  Surgeon: Darlin Coco, MD;  Location: [facility] ENDOSCOPY;  Service: Cardiovascular;  Laterality: N/A;  . Esopagus strectching  2003 and 3 yrs ago  . MASTECTOMY     bilateral  . ORIF ANKLE FRACTURE Right 09/20/2012   Procedure: OPEN REDUCTION INTERNAL FIXATION (ORIF) ANKLE FRACTURE;  Surgeon: Johnn Hai, MD;  Location: [facility];  Service: Orthopedics;  Laterality: Right;  . REVERSE SHOULDER ARTHROPLASTY Right 09/17/2012   Procedure: REVERSE SHOULDER ARTHROPLASTY;  Surgeon: Augustin Schooling, MD;  Location: [place];  Service: Orthopedics;  Laterality: Right;  . SHOULDER OPEN ROTATOR CUFF REPAIR  11/05/2011   Procedure: ROTATOR CUFF REPAIR SHOULDER OPEN;  Surgeon: Tobi Bastos, MD;  Location: [facility] ORS;  Service: Orthopedics;  Laterality: Left;  Left Shoulder Rotator Cuff Repair Open with Graft and Anchors  . TONSILLECTOMY  age 15   Social History:   reports that he quit smoking about 20 years  ago. His smoking use included Pipe and Cigarettes. He quit after 50.00 years of use. He has never used smokeless tobacco. He reports that he drinks alcohol. He reports that he does not use drugs.  Family History  Problem Relation Age of Onset  . Parkinson's disease Mother   . Hip fracture Mother   . Heart disease Mother   . Alzheimer's disease Mother   . Heart disease Father   . Parkinson's disease Sister   . Heart disease Brother     Medications: Patient's Medications  New Prescriptions   No medications on file  Previous  Medications   ACETAMINOPHEN (TYLENOL) 325 MG TABLET    Take 2 tablets (650 mg total) by mouth every 6 (six) hours as needed for mild pain (or Fever >/= 101).   CALCIUM CARBONATE-VITAMIN D 600-200 MG-UNIT TABS    Take 1 tablet by mouth 2 (two) times daily. Take 1 tablet daily for vitamin supplement.   ELIQUIS 5 MG TABS TABLET    TAKE 1 TABLET TWICE DAILY.   FERROUS SULFATE 325 (65 FE) MG TABLET    Takes one tablet by mouth every three days   FUROSEMIDE (LASIX) 20 MG TABLET    TAKE (1/2) TABLET ONCE A DAY FOR EDEMA, HYPERTENSION.   GLUCOSAMINE-CHONDROIT-VIT C-MN (GLUCOSAMINE 1500 COMPLEX) CAPS    Take one tablet once a day   GLUCOSE BLOOD (TRUETEST TEST) TEST STRIP    E11.22 check blood sugar once daily   LISINOPRIL (PRINIVIL,ZESTRIL) 10 MG TABLET    Take 1 tablet (10 mg total) by mouth daily. for blood pressure   METFORMIN (GLUCOPHAGE) 500 MG TABLET    Take 1 tablet (500 mg total) by mouth 2 (two) times daily with a meal.   METOPROLOL TARTRATE (LOPRESSOR) 25 MG TABLET    TAKE 1 TABLET TWICE DAILY.   MULTIPLE VITAMIN (MULITIVITAMIN WITH MINERALS) TABS    Take 1 tablet by mouth daily.    OMEGA-3 FATTY ACIDS (FISH OIL) 1000 MG CAPS    Take 1,000 mg by mouth daily.    SIMVASTATIN (ZOCOR) 10 MG TABLET    TAKE ONE TABLET AT BEDTIME.   TRUEPLUS LANCETS 30G MISC    Use to check blood sugar once daily. Dx E11.9  Modified Medications   No medications on file  Discontinued Medications   No medications on file     Physical Exam:  Vitals:   02/19/17 0956  BP: 106/62  Pulse: 90  Resp: 18  Temp: 98.2 F (36.8 C)  TempSrc: Oral  SpO2: 96%  Weight: 131 lb 3.2 oz (59.5 kg)  Height: _0  (1.676 m)   Body mass index is 21.18 kg/m.  Physical Exam  Constitutional: He is oriented to person, place, and time. He appears well-developed and well-nourished.  Frail, pleasant, A&O  HENT:  Head: Normocephalic and atraumatic.  Eyes: Pupils are equal, round, and reactive to light. Conjunctivae and EOM are  normal.  Cardiovascular: Normal rate, normal heart sounds and intact distal pulses.   Afib, irregular  Pulmonary/Chest: Effort normal and breath sounds normal. No respiratory distress. He has no wheezes. He exhibits no tenderness.  Abdominal: Soft. Bowel sounds are normal. He exhibits no distension. There is no tenderness.  Musculoskeletal: He exhibits edema and tenderness.  Tenderness noted to bilateral LE, worse on left. thin fragile skin. Brownish skin discoloration anteriorly with slight redness. No warmth noted  Neurological: He is alert and oriented to person, place, and time.  Skin: Skin is warm and dry.  No erythema.  Psychiatric: He has a normal mood and affect.    Labs reviewed: Basic Metabolic Panel:  Recent Labs  05/20/16 1135 11/18/16 1003  NA 140 141  K 4.8 4.9  CL 105 104  CO2 26 28  GLUCOSE 115* 184*  BUN 37* 38*  CREATININE 1.04 1.07  CALCIUM 10.0 9.5  TSH 0.37* 0.40   Liver Function Tests:  Recent Labs  05/20/16 1135 11/18/16 1003  AST 13 14  ALT 13 17  ALKPHOS 73 69  BILITOT 0.8 1.1  PROT 5.7* 5.9*  ALBUMIN 3.7 3.7   No results for input(s): LIPASE, AMYLASE in the last 8760 hours. No results for input(s): AMMONIA in the last 8760 hours. CBC:  Recent Labs  05/20/16 1135 11/18/16 1003 01/30/17 1042  WBC 9.2 10.6 7.4  NEUTROABS 6,992 8,692* 6,512  HGB 12.9* 14.4 13.4  HCT 40.9 45.6 42.1  MCV 81.0 88.2 91.5  PLT 243 235 246   Lipid Panel:  Recent Labs  11/18/16 1003  CHOL 184  HDL 53  LDLCALC 107*  TRIG 119  CHOLHDL 3.5   TSH:  Recent Labs  05/20/16 1135 11/18/16 1003  TSH 0.37* 0.40   A1C: Lab Results  Component Value Date   HGBA1C 8.7 (H) 11/18/2016     Assessment/Plan 1. Skin lesion - Ambulatory referral to Dermatology  2. Loss of weight Ongoing, adds protein supplements to diet, encouraged 3 meals a day with proper nutritional value.   3. Chronic atrial fibrillation (HCC) Rate control. Cont on metoprolol 25  mg by mouth twice daily with eliquis for Anticoagulation. Due to weight loss now <60 kg and age will need to dose adjustment. To start Eliquis 2.5 mg by mouth BID   4. Essential hypertension Stable, cont current regimen   5. CKD (chronic kidney disease) stage 3, GFR 30-59 ml/min Will follow up labs, encouraged proper hydration and to avoid NSAIDs.   6. Type 2 diabetes mellitus with stage 3 chronic kidney disease, without long-term current use of insulin (HCC) conts on metformin 500 mg by mouth twice daily  - Hemoglobin A1c - BMP with eGFR  7. Varicosities of leg -stable, has follow up with vein and vascular. Unable to tolerate compression hose.   Next appt:  Carlos American. Harle Battiest  [facility] [phone] 8 am - 5 pm) [phone] (after hours)

## 2017-02-20 LAB — HEMOGLOBIN A1C
Hgb A1c MFr Bld: 7.4 % — ABNORMAL HIGH (ref <5.7)
MEAN PLASMA GLUCOSE: 166 mg/dL

## 2017-03-05 DIAGNOSIS — M170 Bilateral primary osteoarthritis of knee: Secondary | ICD-10-CM | POA: Diagnosis not present

## 2017-03-05 DIAGNOSIS — M1712 Unilateral primary osteoarthritis, left knee: Secondary | ICD-10-CM | POA: Diagnosis not present

## 2017-03-05 DIAGNOSIS — M1711 Unilateral primary osteoarthritis, right knee: Secondary | ICD-10-CM | POA: Diagnosis not present

## 2017-03-12 ENCOUNTER — Other Ambulatory Visit: Payer: Self-pay | Admitting: Nurse Practitioner

## 2017-03-12 DIAGNOSIS — I10 Essential (primary) hypertension: Secondary | ICD-10-CM

## 2017-03-13 NOTE — Telephone Encounter (Signed)
Patient had both 10 mg and 20 mg lisinopril on medication list. I called patient and pharmacy to verify which dosage is correct and both stated that 10 mg is what patient is taking.   20 mg dosage was removed from medication list.

## 2017-03-13 NOTE — Addendum Note (Signed)
Addended by: Denyse Amass on: 03/13/2017 09:05 AM   Modules accepted: Orders

## 2017-04-02 ENCOUNTER — Encounter: Payer: Medicare Other | Admitting: Vascular Surgery

## 2017-04-02 DIAGNOSIS — M1712 Unilateral primary osteoarthritis, left knee: Secondary | ICD-10-CM | POA: Diagnosis not present

## 2017-04-02 DIAGNOSIS — M1711 Unilateral primary osteoarthritis, right knee: Secondary | ICD-10-CM | POA: Diagnosis not present

## 2017-04-02 DIAGNOSIS — M170 Bilateral primary osteoarthritis of knee: Secondary | ICD-10-CM | POA: Diagnosis not present

## 2017-04-07 ENCOUNTER — Encounter: Payer: Self-pay | Admitting: Podiatry

## 2017-04-07 ENCOUNTER — Ambulatory Visit (INDEPENDENT_AMBULATORY_CARE_PROVIDER_SITE_OTHER): Payer: Medicare Other | Admitting: Podiatry

## 2017-04-07 DIAGNOSIS — E1151 Type 2 diabetes mellitus with diabetic peripheral angiopathy without gangrene: Secondary | ICD-10-CM

## 2017-04-07 DIAGNOSIS — M79676 Pain in unspecified toe(s): Secondary | ICD-10-CM | POA: Diagnosis not present

## 2017-04-07 DIAGNOSIS — B351 Tinea unguium: Secondary | ICD-10-CM

## 2017-04-07 NOTE — Progress Notes (Signed)
Patient ID: Logan Bean, male   DOB: [birth year], 83 y.o.   MRN: [id number]    Subjective: This patient presents today complaining of thickened and elongated toenails which are uncomfortable when walking wearing shoes and requests toenail debridement.  Objective: Orientated 3 DP and PT pulses 1/4 bilaterally Capillary reflex immediate bilaterally Hammertoes 2-5 bilaterally Keratoses distal second left toe No open skin lesions bilaterally Atrophic kin with absent hair growth bilaterally The toenails are extremely elongated, brittle, deformed, discolored and tender direct palpation 6-10 Hammertoe 2-5 bilaterally Manual motor testing dorsi flexion, plantar flexion 5/5 bilaterally Patient walks slowly with walker  Assessment: symptomatic onychomycoses 6-10 Type II diabetic with peripheral arterial disease  Plan: Debridement toenails 6-10 mechanically and electrically without any bleeding  Debrided keratoses 1 without any bleeding  Reappoint 3 months

## 2017-04-07 NOTE — Patient Instructions (Signed)

## 2017-04-30 DIAGNOSIS — M170 Bilateral primary osteoarthritis of knee: Secondary | ICD-10-CM | POA: Diagnosis not present

## 2017-05-19 DIAGNOSIS — B078 Other viral warts: Secondary | ICD-10-CM | POA: Diagnosis not present

## 2017-05-28 DIAGNOSIS — M170 Bilateral primary osteoarthritis of knee: Secondary | ICD-10-CM | POA: Diagnosis not present

## 2017-06-11 ENCOUNTER — Other Ambulatory Visit: Payer: Self-pay | Admitting: Nurse Practitioner

## 2017-06-11 ENCOUNTER — Other Ambulatory Visit: Payer: Self-pay | Admitting: Internal Medicine

## 2017-06-11 DIAGNOSIS — I10 Essential (primary) hypertension: Secondary | ICD-10-CM

## 2017-06-23 ENCOUNTER — Ambulatory Visit: Payer: Medicare Other

## 2017-06-25 DIAGNOSIS — M1711 Unilateral primary osteoarthritis, right knee: Secondary | ICD-10-CM | POA: Diagnosis not present

## 2017-06-25 DIAGNOSIS — M1712 Unilateral primary osteoarthritis, left knee: Secondary | ICD-10-CM | POA: Diagnosis not present

## 2017-06-25 DIAGNOSIS — M170 Bilateral primary osteoarthritis of knee: Secondary | ICD-10-CM | POA: Diagnosis not present

## 2017-07-09 DIAGNOSIS — L821 Other seborrheic keratosis: Secondary | ICD-10-CM | POA: Diagnosis not present

## 2017-07-09 DIAGNOSIS — D225 Melanocytic nevi of trunk: Secondary | ICD-10-CM | POA: Diagnosis not present

## 2017-07-09 DIAGNOSIS — D692 Other nonthrombocytopenic purpura: Secondary | ICD-10-CM | POA: Diagnosis not present

## 2017-07-16 DIAGNOSIS — M170 Bilateral primary osteoarthritis of knee: Secondary | ICD-10-CM | POA: Diagnosis not present

## 2017-07-21 ENCOUNTER — Ambulatory Visit: Payer: Medicare Other | Admitting: Podiatry

## 2017-07-21 ENCOUNTER — Emergency Department (HOSPITAL_COMMUNITY)
Admission: EM | Admit: 2017-07-21 | Discharge: 2017-07-21 | Disposition: A | Discharge status: Home / Self Care | Payer: Medicare Other | Attending: Emergency Medicine | Admitting: Emergency Medicine

## 2017-07-21 ENCOUNTER — Encounter (HOSPITAL_COMMUNITY): Payer: Self-pay | Admitting: Internal Medicine

## 2017-07-21 DIAGNOSIS — Z7901 Long term (current) use of anticoagulants: Secondary | ICD-10-CM | POA: Diagnosis not present

## 2017-07-21 DIAGNOSIS — I83892 Varicose veins of left lower extremities with other complications: Secondary | ICD-10-CM | POA: Insufficient documentation

## 2017-07-21 DIAGNOSIS — Z87891 Personal history of nicotine dependence: Secondary | ICD-10-CM | POA: Insufficient documentation

## 2017-07-21 DIAGNOSIS — E039 Hypothyroidism, unspecified: Secondary | ICD-10-CM | POA: Insufficient documentation

## 2017-07-21 DIAGNOSIS — E1122 Type 2 diabetes mellitus with diabetic chronic kidney disease: Secondary | ICD-10-CM | POA: Diagnosis not present

## 2017-07-21 DIAGNOSIS — N183 Chronic kidney disease, stage 3 (moderate): Secondary | ICD-10-CM | POA: Insufficient documentation

## 2017-07-21 DIAGNOSIS — I83899 Varicose veins of unspecified lower extremities with other complications: Secondary | ICD-10-CM

## 2017-07-21 DIAGNOSIS — S99929A Unspecified injury of unspecified foot, initial encounter: Secondary | ICD-10-CM | POA: Diagnosis not present

## 2017-07-21 DIAGNOSIS — I130 Hypertensive heart and chronic kidney disease with heart failure and stage 1 through stage 4 chronic kidney disease, or unspecified chronic kidney disease: Secondary | ICD-10-CM | POA: Diagnosis not present

## 2017-07-21 DIAGNOSIS — Z7984 Long term (current) use of oral hypoglycemic drugs: Secondary | ICD-10-CM | POA: Diagnosis not present

## 2017-07-21 DIAGNOSIS — I509 Heart failure, unspecified: Secondary | ICD-10-CM | POA: Diagnosis not present

## 2017-07-21 LAB — CBC
HCT: 45.1 % (ref 39.0–52.0)
Hemoglobin: 14.4 g/dL (ref 13.0–17.0)
MCH: 28 pg (ref 26.0–34.0)
MCHC: 31.9 g/dL (ref 30.0–36.0)
MCV: 87.7 fL (ref 78.0–100.0)
PLATELETS: 172 10*3/uL (ref 150–400)
RBC: 5.14 MIL/uL (ref 4.22–5.81)
RDW: 16.1 % — AB (ref 11.5–15.5)
WBC: 11.2 10*3/uL — ABNORMAL HIGH (ref 4.0–10.5)

## 2017-07-21 NOTE — ED Triage Notes (Signed)
Pt arrived via [facility] to [facility] from home with a left heel wound. Pt reports "rubbing heel on bed" that made his heel raw and it began to bleed. Pt is on blood thinners and is concerned about bleeding and would like to get it checked out.

## 2017-07-21 NOTE — ED Provider Notes (Signed)
[place] DEPT Provider Note   CSN: [id number] Arrival date & time: 07/21/17  0734     History   Chief Complaint Chief Complaint  Patient presents with  . Wound Check    HPI Logan Bean is a 84 y.o. male.  HPI Patient presents to the emergency room for evaluation of a bleeding wound on his left heel.    Patient states he was just rubbing his heel on the bed when he woke up this morning.  He noticed that his heel was bleeding and there is a pool of blood.  Patient is on Eliquis so he was concerned.  EMS was called and they applied a dressing.  The bleeding has stopped.  Patient denies any trouble with fevers or chills.  No nausea or vomiting.  He does have a history of varicose veins and has had a similar episode in the past but not in that particular location. Past Medical History:  Diagnosis Date  . Arthritis    left knee  . Diabetes mellitus    diet controlled, pt does not check cbg  . Diaphragmatic hernia without mention of obstruction or gangrene   . Dizziness and giddiness   . Edema   . H/O hiatal hernia   . Hypercholesteremia   . Hypertension   . Left rotator cuff tear Jul 12, 2012  . Stomach ulcer    years  . Unspecified hereditary and idiopathic peripheral neuropathy   . Unspecified vitamin D deficiency   . Varicose veins    both legs    Patient Active Problem List   Diagnosis Date Noted  . Iron deficiency anemia secondary to inadequate dietary iron intake 08/19/2016  . Other constipation 08/19/2016  . Capillary vascular disease 11/21/2015  . Venous ulcer of ankle ([place]) 11/02/2015  . Near syncope 04/27/2015  . Type 2 diabetes mellitus with hyperglycemia ([place]) 04/27/2015  . Contusion of multiple sites 04/26/2015  . Type 2 diabetes mellitus with diabetic chronic kidney disease ([place]) 07/11/2014  . Colon cancer screening 07/11/2014  . Hygroma 05/19/2014  . Atrial fibrillation with RVR ([place]) 05/18/2014  . Laceration of skin  of face 05/18/2014  . Fall   . Venous stasis dermatitis 05/16/2014  . Carpal tunnel syndrome 05/10/2014  . CKD (chronic kidney disease) stage 3, GFR 30-59 ml/min (HCC) 10/11/2013  . Restless leg syndrome 05/25/2013  . Hyperthyroidism, subclinical 03/02/2013  . Chronic anticoagulation-Eliquis 01/31/2013  . Chronic atrial fibrillation ([place]) 01/21/2013  . Sweating increase 01/19/2013  . CHF (congestive heart failure) ([place]) 01/19/2013  . Pleural effusion 01/19/2013  . Multinodular goiter 01/19/2013  . Dyspnea 01/15/2013  . Edema 01/15/2013  . Hypokalemia 01/15/2013  . Rash and nonspecific skin eruption 01/15/2013  . Dyslipidemia 12/29/2012  . GERD (gastroesophageal reflux disease) 12/29/2012  . Loss of weight 12/29/2012  . Closed bimalleolar fracture of right ankle 09/28/2012  . Diarrhea 09/22/2012  . Essential hypertension 09/21/2012  . Complete tear of left rotator cuff 11/05/2011    Past Surgical History:  Procedure Laterality Date  . CARDIOVERSION N/A 02/22/2013   Procedure: CARDIOVERSION;  Surgeon: Darlin Coco, MD;  Location: [facility] ENDOSCOPY;  Service: Cardiovascular;  Laterality: N/A;  . Esopagus strectching  2003 and 3 yrs ago  . MASTECTOMY     bilateral  . ORIF ANKLE FRACTURE Right 09/20/2012   Procedure: OPEN REDUCTION INTERNAL FIXATION (ORIF) ANKLE FRACTURE;  Surgeon: Johnn Hai, MD;  Location: [facility] ORS;  Service: Orthopedics;  Laterality: Right;  . REVERSE SHOULDER  ARTHROPLASTY Right 09/17/2012   Procedure: REVERSE SHOULDER ARTHROPLASTY;  Surgeon: Augustin Schooling, MD;  Location: [place];  Service: Orthopedics;  Laterality: Right;  . SHOULDER OPEN ROTATOR CUFF REPAIR  11/05/2011   Procedure: ROTATOR CUFF REPAIR SHOULDER OPEN;  Surgeon: Tobi Bastos, MD;  Location: [facility] ORS;  Service: Orthopedics;  Laterality: Left;  Left Shoulder Rotator Cuff Repair Open with Graft and Anchors  . TONSILLECTOMY  age 29    OB History    No data available       Home Medications     Prior to Admission medications   Medication Sig Start Date End Date Taking? Authorizing Provider  acetaminophen (TYLENOL) 325 MG tablet Take 2 tablets (650 mg total) by mouth every 6 (six) hours as needed for mild pain (or Fever >/= 101). 05/01/15  Yes Velvet Bathe, MD  Calcium Carbonate-Vitamin D 600-200 MG-UNIT TABS Take 1 tablet by mouth 2 (two) times daily. Take 1 tablet daily for vitamin supplement.   Yes [provider]  ELIQUIS 2.5 MG TABS tablet TAKE 1 TABLET BY MOUTH TWICE DAILY. 06/11/17  Yes Lauree Chandler, NP  ferrous sulfate 325 (65 FE) MG tablet Takes one tablet by mouth every three days   Yes [provider]  furosemide (LASIX) 20 MG tablet TAKE (1/2) TABLET ONCE A DAY FOR EDEMA, HYPERTENSION. 12/23/16  Yes Lauree Chandler, NP  Glucosamine-Chondroit-Vit C-Mn (GLUCOSAMINE 1500 COMPLEX) CAPS Take one tablet once a day 01/12/13  Yes Bubba Camp, Mahima, MD  lisinopril (PRINIVIL,ZESTRIL) 10 MG tablet TAKE 1 TABLET BY MOUTH DAILY. 06/11/17  Yes Lauree Chandler, NP  metFORMIN (GLUCOPHAGE) 500 MG tablet TAKE 1 TABLET BY MOUTH TWICE DAILY WITH A MEAL. 06/11/17  Yes Lauree Chandler, NP  metoprolol tartrate (LOPRESSOR) 25 MG tablet TAKE 1 TABLET BY MOUTH TWICE DAILY. 06/11/17  Yes Lauree Chandler, NP  Multiple Vitamin (MULITIVITAMIN WITH MINERALS) TABS Take 1 tablet by mouth daily.    Yes [provider]  Omega-3 Fatty Acids (FISH OIL) 1000 MG CAPS Take 1,000 mg by mouth daily.    Yes [provider]  simvastatin (ZOCOR) 10 MG tablet TAKE ONE TABLET AT BEDTIME. 06/11/17  Yes Lauree Chandler, NP  glucose blood (TRUETEST TEST) test strip E11.22 check blood sugar once daily 06/13/15   Lauree Chandler, NP  TRUEPLUS LANCETS 30G MISC Use to check blood sugar once daily. Dx E11.9 06/13/15   Lauree Chandler, NP    Family History Family History  Problem Relation Age of Onset  . Parkinson's disease Mother   . Hip fracture Mother   . Heart  disease Mother   . Alzheimer's disease Mother   . Heart disease Father   . Parkinson's disease Sister   . Heart disease Brother     Social History Social History   Tobacco Use  . Smoking status: Former Smoker    Years: 50.00    Types: Pipe, Cigarettes    Last attempt to quit: 07/14/1996    Years since quitting: 21.0  . Smokeless tobacco: Never Used  Substance Use Topics  . Alcohol use: Yes    Comment: occasional beer  . Drug use: No     Allergies   Morphine and related and Oxycodone   Review of Systems Review of Systems  All other systems reviewed and are negative.    Physical Exam Updated Vital Signs BP 121/79 (BP Location: Left Wrist)   Pulse 87   Temp (!) 97.5 F (36.4  C) (Oral)   Resp 18   SpO2 97%   Physical Exam  Constitutional: He appears well-developed and well-nourished. No distress.  HENT:  Head: Normocephalic and atraumatic.  Right Ear: External ear normal.  Left Ear: External ear normal.  Eyes: Conjunctivae are normal. Right eye exhibits no discharge. Left eye exhibits no discharge. No scleral icterus.  Neck: Neck supple. No tracheal deviation present.  Cardiovascular: Normal rate.  Pulmonary/Chest: Effort normal. No stridor. No respiratory distress.  Abdominal: He exhibits no distension.  Musculoskeletal: He exhibits no edema.  Small varicose vein on the posterior aspect of the left heel, initially was not bleeding but as I remove the dressing small amount of oozing blood was noted, no evidence of erythema, no ulceration  Neurological: He is alert. Cranial nerve deficit: no gross deficits.  Skin: Skin is warm and dry. No rash noted.  Psychiatric: He has a normal mood and affect.  Nursing note and vitals reviewed.    ED Treatments / Results  Labs (all labs ordered are listed, but only abnormal results are displayed) Labs Reviewed  CBC - Abnormal; Notable for the following components:      Result Value   WBC 11.2 (*)    RDW 16.1 (*)     All other components within normal limits    EKG  EKG Interpretation None       Radiology No results found.  Procedures Procedures (including critical care time) Sterile dressing was applied by me.  Quick clot dressing was applied initially.  I then applied sterile gauze over the location and wrapped it with elastic dressing.  Bleeding controlled. Medications Ordered in ED Medications - No data to display   Initial Impression / Assessment and Plan / ED Course  I have reviewed the triage vital signs and the nursing notes.  Pertinent labs & imaging results that were available during my care of the patient were reviewed by me and considered in my medical decision making (see chart for details).    Patient had a small varicose vein that was bleeding on his heel.  Patient's bleeding was controlled with a dressing.  Hemoglobin is stable.  Patient is stable for discharge  Final Clinical Impressions(s) / ED Diagnoses   Final diagnoses:  Bleeding from varicose vein    ED Discharge Orders    None       Dorie Rank, MD 07/21/17 1031

## 2017-07-21 NOTE — ED Notes (Signed)
ED Provider at bedside. 

## 2017-07-21 NOTE — ED Notes (Signed)
Bed: GN56 Expected date:  Expected time:  Means of arrival:  Comments: 84 yo m heel wound

## 2017-07-21 NOTE — Discharge Instructions (Signed)
Keep the dressing on as we discussed.  You can loosen the elastic bandage if you need to if you feel that is becoming too tight.  Please to leave the white gauze on underneath for at least a couple of days.  Return to the emergency room as needed for any recurrent bleeding.

## 2017-07-22 ENCOUNTER — Emergency Department (HOSPITAL_COMMUNITY)
Admission: EM | Admit: 2017-07-22 | Discharge: 2017-07-23 | Disposition: A | Discharge status: Home / Self Care | Payer: Medicare Other | Attending: Emergency Medicine | Admitting: Emergency Medicine

## 2017-07-22 ENCOUNTER — Encounter (HOSPITAL_COMMUNITY): Payer: Self-pay | Admitting: Emergency Medicine

## 2017-07-22 ENCOUNTER — Ambulatory Visit (INDEPENDENT_AMBULATORY_CARE_PROVIDER_SITE_OTHER): Payer: Medicare Other | Admitting: Nurse Practitioner

## 2017-07-22 ENCOUNTER — Encounter: Payer: Self-pay | Admitting: Nurse Practitioner

## 2017-07-22 VITALS — BP 112/68 | HR 67 | Temp 98.2°F | Resp 16 | Ht 66.0 in | Wt 125.0 lb

## 2017-07-22 DIAGNOSIS — R609 Edema, unspecified: Secondary | ICD-10-CM | POA: Diagnosis not present

## 2017-07-22 DIAGNOSIS — Z7984 Long term (current) use of oral hypoglycemic drugs: Secondary | ICD-10-CM | POA: Diagnosis not present

## 2017-07-22 DIAGNOSIS — I83899 Varicose veins of unspecified lower extremities with other complications: Secondary | ICD-10-CM | POA: Diagnosis not present

## 2017-07-22 DIAGNOSIS — Z79899 Other long term (current) drug therapy: Secondary | ICD-10-CM | POA: Insufficient documentation

## 2017-07-22 DIAGNOSIS — E1140 Type 2 diabetes mellitus with diabetic neuropathy, unspecified: Secondary | ICD-10-CM | POA: Insufficient documentation

## 2017-07-22 DIAGNOSIS — R42 Dizziness and giddiness: Secondary | ICD-10-CM | POA: Diagnosis not present

## 2017-07-22 DIAGNOSIS — I130 Hypertensive heart and chronic kidney disease with heart failure and stage 1 through stage 4 chronic kidney disease, or unspecified chronic kidney disease: Secondary | ICD-10-CM | POA: Diagnosis not present

## 2017-07-22 DIAGNOSIS — E1122 Type 2 diabetes mellitus with diabetic chronic kidney disease: Secondary | ICD-10-CM | POA: Insufficient documentation

## 2017-07-22 DIAGNOSIS — I509 Heart failure, unspecified: Secondary | ICD-10-CM | POA: Insufficient documentation

## 2017-07-22 DIAGNOSIS — N183 Chronic kidney disease, stage 3 (moderate): Secondary | ICD-10-CM | POA: Insufficient documentation

## 2017-07-22 DIAGNOSIS — Z87891 Personal history of nicotine dependence: Secondary | ICD-10-CM | POA: Insufficient documentation

## 2017-07-22 DIAGNOSIS — R69 Illness, unspecified: Secondary | ICD-10-CM | POA: Diagnosis not present

## 2017-07-22 DIAGNOSIS — I8392 Asymptomatic varicose veins of left lower extremity: Secondary | ICD-10-CM | POA: Diagnosis present

## 2017-07-22 DIAGNOSIS — R600 Localized edema: Secondary | ICD-10-CM | POA: Diagnosis not present

## 2017-07-22 LAB — CBC
HCT: 42.1 % (ref 39.0–52.0)
Hemoglobin: 13.7 g/dL (ref 13.0–17.0)
MCH: 29.1 pg (ref 26.0–34.0)
MCHC: 32.5 g/dL (ref 30.0–36.0)
MCV: 89.4 fL (ref 78.0–100.0)
PLATELETS: 190 10*3/uL (ref 150–400)
RBC: 4.71 MIL/uL (ref 4.22–5.81)
RDW: 16.1 % — ABNORMAL HIGH (ref 11.5–15.5)
WBC: 7.8 10*3/uL (ref 4.0–10.5)

## 2017-07-22 MED ORDER — BACITRACIN ZINC 500 UNIT/GM EX OINT
TOPICAL_OINTMENT | Freq: Once | CUTANEOUS | Status: AC
  Administered 2017-07-22: 1 via TOPICAL
  Filled 2017-07-22: qty 0.9

## 2017-07-22 NOTE — ED Provider Notes (Signed)
[place] DEPT Provider Note   CSN: [id number] Arrival date & time: 07/22/17  1701     History   Chief Complaint Chief Complaint  Patient presents with  . Coagulation Disorder    Varicose Vein (Bleeding)    HPI Logan Bean is a 84 y.o. male.  HPI Patient presents with bleeding varicose vein on left heel.  Seen yesterday for same.  Is on Eliquis.  Had his wound wrapped and had been oozing a serosanguineous drainage.  Went to primary care doctor and it reportedly had some more bleeding and drainage.  Sent in the ER for further evaluation.  Has abrasions on the leg.  Had more swelling and discoloration of the foot that is improved some after changing the dressing.  No chest pain or other breathing.  Patient states he feels a little lightheaded Past Medical History:  Diagnosis Date  . Arthritis    left knee  . Diabetes mellitus    diet controlled, pt does not check cbg  . Diaphragmatic hernia without mention of obstruction or gangrene   . Dizziness and giddiness   . Edema   . H/O hiatal hernia   . Hypercholesteremia   . Hypertension   . Left rotator cuff tear Jul 12, 2012  . Stomach ulcer    years  . Unspecified hereditary and idiopathic peripheral neuropathy   . Unspecified vitamin D deficiency   . Varicose veins    both legs    Patient Active Problem List   Diagnosis Date Noted  . Iron deficiency anemia secondary to inadequate dietary iron intake 08/19/2016  . Other constipation 08/19/2016  . Capillary vascular disease 11/21/2015  . Venous ulcer of ankle ([place]) 11/02/2015  . Near syncope 04/27/2015  . Type 2 diabetes mellitus with hyperglycemia ([place]) 04/27/2015  . Contusion of multiple sites 04/26/2015  . Type 2 diabetes mellitus with diabetic chronic kidney disease ([place]) 07/11/2014  . Colon cancer screening 07/11/2014  . Hygroma 05/19/2014  . Atrial fibrillation with RVR ([place]) 05/18/2014  . Laceration of skin of face 05/18/2014    . Fall   . Venous stasis dermatitis 05/16/2014  . Carpal tunnel syndrome 05/10/2014  . CKD (chronic kidney disease) stage 3, GFR 30-59 ml/min (HCC) 10/11/2013  . Restless leg syndrome 05/25/2013  . Hyperthyroidism, subclinical 03/02/2013  . Chronic anticoagulation-Eliquis 01/31/2013  . Chronic atrial fibrillation ([place]) 01/21/2013  . Sweating increase 01/19/2013  . CHF (congestive heart failure) ([place]) 01/19/2013  . Pleural effusion 01/19/2013  . Multinodular goiter 01/19/2013  . Dyspnea 01/15/2013  . Edema 01/15/2013  . Hypokalemia 01/15/2013  . Rash and nonspecific skin eruption 01/15/2013  . Dyslipidemia 12/29/2012  . GERD (gastroesophageal reflux disease) 12/29/2012  . Loss of weight 12/29/2012  . Closed bimalleolar fracture of right ankle 09/28/2012  . Diarrhea 09/22/2012  . Essential hypertension 09/21/2012  . Complete tear of left rotator cuff 11/05/2011    Past Surgical History:  Procedure Laterality Date  . CARDIOVERSION N/A 02/22/2013   Procedure: CARDIOVERSION;  Surgeon: Darlin Coco, MD;  Location: [facility] ENDOSCOPY;  Service: Cardiovascular;  Laterality: N/A;  . Esopagus strectching  2003 and 3 yrs ago  . MASTECTOMY     bilateral  . ORIF ANKLE FRACTURE Right 09/20/2012   Procedure: OPEN REDUCTION INTERNAL FIXATION (ORIF) ANKLE FRACTURE;  Surgeon: Johnn Hai, MD;  Location: [facility] ORS;  Service: Orthopedics;  Laterality: Right;  . REVERSE SHOULDER ARTHROPLASTY Right 09/17/2012   Procedure: REVERSE SHOULDER ARTHROPLASTY;  Surgeon: Augustin Schooling,  MD;  Location: [place];  Service: Orthopedics;  Laterality: Right;  . SHOULDER OPEN ROTATOR CUFF REPAIR  11/05/2011   Procedure: ROTATOR CUFF REPAIR SHOULDER OPEN;  Surgeon: Tobi Bastos, MD;  Location: [facility] ORS;  Service: Orthopedics;  Laterality: Left;  Left Shoulder Rotator Cuff Repair Open with Graft and Anchors  . TONSILLECTOMY  age 89    OB History    No data available       Home Medications    Prior to Admission  medications   Medication Sig Start Date End Date Taking? Authorizing Provider  acetaminophen (TYLENOL) 325 MG tablet Take 650 mg by mouth every 6 (six) hours as needed.   Yes [provider]  Calcium Carbonate-Vitamin D 600-200 MG-UNIT TABS Take 1 tablet by mouth 2 (two) times daily. Take 1 tablet daily for vitamin supplement.   Yes [provider]  ELIQUIS 2.5 MG TABS tablet TAKE 1 TABLET BY MOUTH TWICE DAILY. 06/11/17  Yes Lauree Chandler, NP  ferrous sulfate 325 (65 FE) MG tablet Take 325 mg by mouth every 3 (three) days. Takes one tablet by mouth every three days    Yes [provider]  furosemide (LASIX) 20 MG tablet TAKE (1/2) TABLET ONCE A DAY FOR EDEMA, HYPERTENSION. 12/23/16  Yes Lauree Chandler, NP  Glucosamine-Chondroit-Vit C-Mn (GLUCOSAMINE 1500 COMPLEX) CAPS Take one tablet once a day 01/12/13  Yes Bubba Camp, Mahima, MD  glucose blood (TRUETEST TEST) test strip E11.22 check blood sugar once daily 06/13/15  Yes Eubanks, Carlos American, NP  lisinopril (PRINIVIL,ZESTRIL) 10 MG tablet TAKE 1 TABLET BY MOUTH DAILY. 06/11/17  Yes Lauree Chandler, NP  metFORMIN (GLUCOPHAGE) 500 MG tablet TAKE 1 TABLET BY MOUTH TWICE DAILY WITH A MEAL. 06/11/17  Yes Lauree Chandler, NP  metoprolol tartrate (LOPRESSOR) 25 MG tablet TAKE 1 TABLET BY MOUTH TWICE DAILY. 06/11/17  Yes Lauree Chandler, NP  Multiple Vitamin (MULITIVITAMIN WITH MINERALS) TABS Take 1 tablet by mouth daily.    Yes [provider]  Omega-3 Fatty Acids (FISH OIL) 1000 MG CAPS Take 1,000 mg by mouth daily.    Yes [provider]  simvastatin (ZOCOR) 10 MG tablet TAKE ONE TABLET AT BEDTIME. 06/11/17  Yes Eubanks, Carlos American, NP  TRUEPLUS LANCETS 30G MISC Use to check blood sugar once daily. Dx E11.9 06/13/15  Yes Lauree Chandler, NP  acetaminophen (TYLENOL) 325 MG tablet Take 2 tablets (650 mg total) by mouth every 6 (six) hours as needed for mild pain (or Fever >/= 101). Patient not taking:  Reported on 07/22/2017 05/01/15   Velvet Bathe, MD    Family History Family History  Problem Relation Age of Onset  . Parkinson's disease Mother   . Hip fracture Mother   . Heart disease Mother   . Alzheimer's disease Mother   . Heart disease Father   . Parkinson's disease Sister   . Heart disease Brother     Social History Social History   Tobacco Use  . Smoking status: Former Smoker    Years: 50.00    Types: Pipe, Cigarettes    Last attempt to quit: 07/14/1996    Years since quitting: 21.0  . Smokeless tobacco: Never Used  Substance Use Topics  . Alcohol use: Yes    Comment: occasional beer  . Drug use: No     Allergies   Morphine and related and Oxycodone   Review of Systems Review of Systems  Constitutional: Negative for appetite change.  HENT:  Negative for congestion.   Respiratory: Negative for shortness of breath.   Cardiovascular: Positive for leg swelling.  Gastrointestinal: Negative for abdominal pain.  Genitourinary: Negative for flank pain.  Musculoskeletal: Negative for back pain.  Skin: Negative for rash.  Neurological: Positive for light-headedness.  Hematological: Negative for adenopathy. Bruises/bleeds easily.  Psychiatric/Behavioral: Negative for confusion.     Physical Exam Updated Vital Signs BP (!) 146/87 (BP Location: Right Arm)   Pulse 80   Temp 98.7 F (37.1 C) (Oral)   Resp 18   SpO2 95%   Physical Exam  Constitutional: He appears well-developed.  HENT:  Head: Atraumatic.  Neck: Neck supple.  Cardiovascular: Normal rate.  Pulmonary/Chest: Effort normal.  Abdominal: There is no tenderness.  Musculoskeletal: He exhibits edema.  Some chronic venous changes both lower extremities.  Some varicose veins bilaterally.  Dried blood on heel with a couple small areas that could previously have been bleeding.  No active bleeding now.  There is edema anterior to where the dressing was.  With less purple than pictures from earlier.  2  superficial abrasions on left anterior lower leg and posterior lower leg.  No surrounding erythema.  Neurological: He is alert.  Skin: Skin is warm. Capillary refill takes less than 2 seconds.  Psychiatric: He has a normal mood and affect.     ED Treatments / Results  Labs (all labs ordered are listed, but only abnormal results are displayed) Labs Reviewed  CBC - Abnormal; Notable for the following components:      Result Value   RDW 16.1 (*)    All other components within normal limits    EKG  EKG Interpretation None       Radiology No results found.  Procedures Procedures (including critical care time)  Medications Ordered in ED Medications  bacitracin ointment (1 application Topical Given 07/22/17 2334)     Initial Impression / Assessment and Plan / ED Course  I have reviewed the triage vital signs and the nursing notes.  Pertinent labs & imaging results that were available during my care of the patient were reviewed by me and considered in my medical decision making (see chart for details).     Patient with wound on left heel.  Had previously been bleeding but is stopped now.  Has 2 superficial skin tears.  Has edema on the foot that has improved even since arrival here.  Hemoglobin stable.  Outpatient Doppler to be ordered.  Dressings placed.  Will keep foot elevated.  Discharge home.  Final Clinical Impressions(s) / ED Diagnoses   Final diagnoses:  Peripheral edema    ED Discharge Orders        Ordered    LE VENOUS     07/22/17 2228       Davonna Belling, MD 07/22/17 2338

## 2017-07-22 NOTE — ED Triage Notes (Signed)
Patient here from dr office with complaints of bleeding varicose vein to left heel. States that he was seen here yesterday for same. Bleeding started after hitting heel again. Bleeding controlled now.

## 2017-07-22 NOTE — Discharge Instructions (Signed)
Watch for more bleeding from her heel.  Keep your foot elevated.  Leave the pressure dressing on tonight and he can take it off tomorrow and leave the underlying dressing on for another day or 2.

## 2017-07-22 NOTE — Progress Notes (Signed)
Careteam: Patient Care Team: Lauree Chandler, NP as PCP - General (Nurse Practitioner)  Advanced Directive information Does Patient Have a Medical Advance Directive?: Yes, Type of Advance Directive: Healthcare Power of Attorney  Allergies  Allergen Reactions  . Morphine And Related Nausea And Vomiting  . Oxycodone Nausea And Vomiting    Chief Complaint  Patient presents with  . Acute Visit    Pt is being seen due to bleeding blood vessel in left foot.      HPI: Patient is a 84 y.o. male seen in the office today due to bleeding on left heel. Pt was seen in ED yesterday for evaluation of a bleeding. He was rubbing heel on bed when he woke up with a pool of blood. He was on eliquis so he was concerned EMS called and applied dressed and then he went to ED the following day for a check. He has hx of varicose veins and had similar issue in the past.  Hemoglobin and bleeding was controlled in the ED Was told to leave coagulation dressing on for a few days and change the outside wraps however he has had a copious amount of weeping and drainage and everything was wet. Plastic bag was placed over foot to come into office.  Medical assistant noted left foot was blue purple when removing bag and unwrapping dressing. Pt denies pain in foot. Reports tender to heal.    Review of Systems:  Review of Systems  Constitutional: Negative for chills, fever and weight loss.  Respiratory: Negative for shortness of breath.   Cardiovascular: Positive for leg swelling. Negative for chest pain.  Skin:       Bleeding heel due to varicose vein.     Past Medical History:  Diagnosis Date  . Arthritis    left knee  . Diabetes mellitus    diet controlled, pt does not check cbg  . Diaphragmatic hernia without mention of obstruction or gangrene   . Dizziness and giddiness   . Edema   . H/O hiatal hernia   . Hypercholesteremia   . Hypertension   . Left rotator cuff tear Jul 12, 2012  . Stomach  ulcer    years  . Unspecified hereditary and idiopathic peripheral neuropathy   . Unspecified vitamin D deficiency   . Varicose veins    both legs   Past Surgical History:  Procedure Laterality Date  . CARDIOVERSION N/A 02/22/2013   Procedure: CARDIOVERSION;  Surgeon: Darlin Coco, MD;  Location: [facility] ENDOSCOPY;  Service: Cardiovascular;  Laterality: N/A;  . Esopagus strectching  2003 and 3 yrs ago  . MASTECTOMY     bilateral  . ORIF ANKLE FRACTURE Right 09/20/2012   Procedure: OPEN REDUCTION INTERNAL FIXATION (ORIF) ANKLE FRACTURE;  Surgeon: Johnn Hai, MD;  Location: [facility] ORS;  Service: Orthopedics;  Laterality: Right;  . REVERSE SHOULDER ARTHROPLASTY Right 09/17/2012   Procedure: REVERSE SHOULDER ARTHROPLASTY;  Surgeon: Augustin Schooling, MD;  Location: [place];  Service: Orthopedics;  Laterality: Right;  . SHOULDER OPEN ROTATOR CUFF REPAIR  11/05/2011   Procedure: ROTATOR CUFF REPAIR SHOULDER OPEN;  Surgeon: Tobi Bastos, MD;  Location: [facility];  Service: Orthopedics;  Laterality: Left;  Left Shoulder Rotator Cuff Repair Open with Graft and Anchors  . TONSILLECTOMY  age 65   Social History:   reports that he quit smoking about 21 years ago. His smoking use included pipe and cigarettes. He quit after 50.00 years of use. he has never  used smokeless tobacco. He reports that he drinks alcohol. He reports that he does not use drugs.  Family History  Problem Relation Age of Onset  . Parkinson's disease Mother   . Hip fracture Mother   . Heart disease Mother   . Alzheimer's disease Mother   . Heart disease Father   . Parkinson's disease Sister   . Heart disease Brother     Medications:   Medication List        Accurate as of 07/22/17  4:37 PM. Always use your most recent med list.          acetaminophen 325 MG tablet Commonly known as:  TYLENOL Take 2 tablets (650 mg total) by mouth every 6 (six) hours as needed for mild pain (or Fever >/= 101).   Calcium Carbonate-Vitamin  D 600-200 MG-UNIT Tabs   ELIQUIS 2.5 MG Tabs tablet Generic drug:  apixaban TAKE 1 TABLET BY MOUTH TWICE DAILY.   ferrous sulfate 325 (65 FE) MG tablet   Fish Oil 1000 MG Caps   furosemide 20 MG tablet Commonly known as:  LASIX TAKE (1/2) TABLET ONCE A DAY FOR EDEMA, HYPERTENSION.   GLUCOSAMINE 1500 COMPLEX Caps Take one tablet once a day   glucose blood test strip Commonly known as:  TRUETEST TEST E11.22 check blood sugar once daily   lisinopril 10 MG tablet Commonly known as:  PRINIVIL,ZESTRIL TAKE 1 TABLET BY MOUTH DAILY.   metFORMIN 500 MG tablet Commonly known as:  GLUCOPHAGE TAKE 1 TABLET BY MOUTH TWICE DAILY WITH A MEAL.   metoprolol tartrate 25 MG tablet Commonly known as:  LOPRESSOR TAKE 1 TABLET BY MOUTH TWICE DAILY.   multivitamin with minerals Tabs tablet   simvastatin 10 MG tablet Commonly known as:  ZOCOR TAKE ONE TABLET AT BEDTIME.   TRUEPLUS LANCETS 30G Misc Use to check blood sugar once daily. Dx E11.9        Physical Exam:  Vitals:   07/22/17 1603  BP: 112/68  Pulse: 67  Resp: 16  Temp: 98.2 F (36.8 C)  TempSrc: Oral  SpO2: 96%  Weight: 125 lb (56.7 kg)  Height: 5\' 6"  (1.676 m)   Body mass index is 20.18 kg/m.  Physical Exam  Constitutional: He is oriented to person, place, and time. He appears well-developed and well-nourished.  Frail, pleasant, A&O  HENT:  Head: Normocephalic and atraumatic.  Eyes: Conjunctivae and EOM are normal. Pupils are equal, round, and reactive to light.  Cardiovascular: Normal rate, normal heart sounds and intact distal pulses.  Afib, irregular  Pulmonary/Chest: Effort normal and breath sounds normal. No respiratory distress. He has no wheezes. He exhibits no tenderness.  Abdominal: Soft. Bowel sounds are normal. He exhibits no distension. There is no tenderness.  Musculoskeletal: He exhibits edema and tenderness.  Tenderness noted to bilateral LE, worse on left. thin fragile skin. Brownish skin  discoloration anteriorly with slight redness. No warmth noted. Small varicose vein on the posterior aspect of the left heel, initially was not bleeding but dressing was saturated with serous fluid from weeps and feel off during exam and immediately started bleeding and bleeding did not stop with pressure.  Bilateral feet with decreased pulses Left foot blue and purple when initial entered room then turned to white.  Right foot white with varicose veins and diminished pulses.   Neurological: He is alert and oriented to person, place, and time.  Skin: Skin is warm and dry. No erythema.  Psychiatric: He has a normal mood  and affect.    Labs reviewed: Basic Metabolic Panel: Recent Labs    11/18/16 1003 02/19/17 1033  NA 141 141  K 4.9 4.3  CL 104 104  CO2 28 25  GLUCOSE 184* 197*  BUN 38* 35*  CREATININE 1.07 0.94  CALCIUM 9.5 9.4  TSH 0.40  --    Liver Function Tests: Recent Labs    11/18/16 1003  AST 14  ALT 17  ALKPHOS 69  BILITOT 1.1  PROT 5.9*  ALBUMIN 3.7   No results for input(s): LIPASE, AMYLASE in the last 8760 hours. No results for input(s): AMMONIA in the last 8760 hours. CBC: Recent Labs    11/18/16 1003 01/30/17 1042 07/21/17 0926  WBC 10.6 7.4 11.2*  NEUTROABS 8,692* 6,512  --   HGB 14.4 13.4 14.4  HCT 45.6 42.1 45.1  MCV 88.2 91.5 87.7  PLT 235 246 172   Lipid Panel: Recent Labs    11/18/16 1003  CHOL 184  HDL 53  LDLCALC 107*  TRIG 119  CHOLHDL 3.5   TSH: Recent Labs    11/18/16 1003  TSH 0.40   A1C: Lab Results  Component Value Date   HGBA1C 7.4 (H) 02/19/2017     Assessment/Plan 1. Bleeding from varicose vein Bleeding restarted in office after dressing unable to stay in place due to saturation. Pressure applied without stopping bleed. Also foot with increased blue/purple tint that caregiver reported was new. Pt able to move bilateral feet and sensation intact  EMS called and transported to hospital due to inability to stop  bleeding.  VSS stable  Logan Bean K. Harle Battiest  [facility] [phone] 8 am - 5 pm) [phone] (after hours)

## 2017-07-23 ENCOUNTER — Ambulatory Visit (HOSPITAL_BASED_OUTPATIENT_CLINIC_OR_DEPARTMENT_OTHER)
Admission: RE | Admit: 2017-07-23 | Discharge: 2017-07-23 | Disposition: A | Discharge status: Home / Self Care | Payer: Medicare Other | Source: Ambulatory Visit | Attending: Emergency Medicine | Admitting: Emergency Medicine

## 2017-07-23 DIAGNOSIS — R609 Edema, unspecified: Secondary | ICD-10-CM

## 2017-07-23 DIAGNOSIS — R600 Localized edema: Secondary | ICD-10-CM | POA: Insufficient documentation

## 2017-07-23 NOTE — Progress Notes (Signed)
Left lower extremity venous duplex has been completed. Negative for DVT.  07/23/17 8:53 AM Carlos Levering RVT

## 2017-07-23 NOTE — ED Notes (Signed)
Wounds dressed and wrapped with pressure dressing.

## 2017-07-28 ENCOUNTER — Encounter: Payer: Self-pay | Admitting: Nurse Practitioner

## 2017-07-28 ENCOUNTER — Ambulatory Visit (INDEPENDENT_AMBULATORY_CARE_PROVIDER_SITE_OTHER): Payer: Medicare Other | Admitting: Nurse Practitioner

## 2017-07-28 VITALS — BP 134/72 | HR 60 | Temp 97.5°F | Ht 66.0 in | Wt 124.0 lb

## 2017-07-28 DIAGNOSIS — I83899 Varicose veins of unspecified lower extremities with other complications: Secondary | ICD-10-CM | POA: Diagnosis not present

## 2017-07-28 DIAGNOSIS — R5381 Other malaise: Secondary | ICD-10-CM

## 2017-07-28 DIAGNOSIS — E440 Moderate protein-calorie malnutrition: Secondary | ICD-10-CM

## 2017-07-28 DIAGNOSIS — S81802A Unspecified open wound, left lower leg, initial encounter: Secondary | ICD-10-CM | POA: Diagnosis not present

## 2017-07-28 NOTE — Patient Instructions (Signed)
Increase protein intake Would recommend 2 protein drinks daily - AFTER meal.  Left leg wound Xeroform to left leg wound - to change every 2-3 days 4x4 gauze on top of xeroform -to change at least daily or if wet To apply kerlex around leg to secure dressing  Heel 4x4 then apply kerlex to keep in place.

## 2017-07-28 NOTE — Progress Notes (Signed)
Careteam: Patient Care Team: Lauree Chandler, NP as PCP - General (Nurse Practitioner)  Advanced Directive information    Allergies  Allergen Reactions  . Morphine And Related Nausea And Vomiting  . Oxycodone Nausea And Vomiting    Chief Complaint  Patient presents with  . Follow-up    Pt is being seen to follow up on left heel wound. Pt may have bumped heel but unsure of when. Pt reports that area was bleeding again this morning.      HPI: Patient is a 84 y.o. male seen in the office today wound check.  Went to ED following OV on 9th due to persistent bleeding of varicose vein. Bleeding has since resolved.they have changed the dressing 3 times since. Noted a small amount of bleeding with dressing change today.  Pt on eliquis due to a fib.   Also has another wound to posterior aspect of left leg. They have been changing this dressing as well.  conts to have weeping.  LE venous ulcer done after ED visit which was negative for DVT or vein obstructions   Weight is currently down from 140 lb a year ago, he was 131 lbs in August 2018 and 125 lbs on last visit, 124 lbs today. He does not eat enough per son who has encouraged him to increase PO intake.   Review of Systems:  Review of Systems  Constitutional: Negative for chills, fever and weight loss.       Increases weakness due to decrease mobility due to bleeding vein   Respiratory: Negative for cough and shortness of breath.   Cardiovascular: Positive for leg swelling. Negative for chest pain.  Musculoskeletal: Negative for myalgias.  Skin:       Previously bleeding heel due to varicose vein.  Skin breakdown to posterior and anterior left lower leg  Neurological: Positive for weakness. Negative for dizziness and headaches.    Past Medical History:  Diagnosis Date  . Arthritis    left knee  . Diabetes mellitus    diet controlled, pt does not check cbg  . Diaphragmatic hernia without mention of obstruction or  gangrene   . Dizziness and giddiness   . Edema   . H/O hiatal hernia   . Hypercholesteremia   . Hypertension   . Left rotator cuff tear Jul 12, 2012  . Stomach ulcer    years  . Unspecified hereditary and idiopathic peripheral neuropathy   . Unspecified vitamin D deficiency   . Varicose veins    both legs   Past Surgical History:  Procedure Laterality Date  . CARDIOVERSION N/A 02/22/2013   Procedure: CARDIOVERSION;  Surgeon: Darlin Coco, MD;  Location: [facility] ENDOSCOPY;  Service: Cardiovascular;  Laterality: N/A;  . Esopagus strectching  2003 and 3 yrs ago  . MASTECTOMY     bilateral  . ORIF ANKLE FRACTURE Right 09/20/2012   Procedure: OPEN REDUCTION INTERNAL FIXATION (ORIF) ANKLE FRACTURE;  Surgeon: Johnn Hai, MD;  Location: [facility];  Service: Orthopedics;  Laterality: Right;  . REVERSE SHOULDER ARTHROPLASTY Right 09/17/2012   Procedure: REVERSE SHOULDER ARTHROPLASTY;  Surgeon: Augustin Schooling, MD;  Location: [place];  Service: Orthopedics;  Laterality: Right;  . SHOULDER OPEN ROTATOR CUFF REPAIR  11/05/2011   Procedure: ROTATOR CUFF REPAIR SHOULDER OPEN;  Surgeon: Tobi Bastos, MD;  Location: [facility];  Service: Orthopedics;  Laterality: Left;  Left Shoulder Rotator Cuff Repair Open with Graft and Anchors  . TONSILLECTOMY  age 34  Social History:   reports that he quit smoking about 21 years ago. His smoking use included pipe and cigarettes. He quit after 50.00 years of use. he has never used smokeless tobacco. He reports that he drinks alcohol. He reports that he does not use drugs.  Family History  Problem Relation Age of Onset  . Parkinson's disease Mother   . Hip fracture Mother   . Heart disease Mother   . Alzheimer's disease Mother   . Heart disease Father   . Parkinson's disease Sister   . Heart disease Brother     Medications: Patient's Medications  New Prescriptions   No medications on file  Previous Medications   ACETAMINOPHEN (TYLENOL) 325 MG TABLET     Take 650 mg by mouth every 6 (six) hours as needed.   CALCIUM CARBONATE-VITAMIN D 600-200 MG-UNIT TABS    Take 1 tablet by mouth 2 (two) times daily. Take 1 tablet daily for vitamin supplement.   ELIQUIS 2.5 MG TABS TABLET    TAKE 1 TABLET BY MOUTH TWICE DAILY.   FERROUS SULFATE 325 (65 FE) MG TABLET    Take 325 mg by mouth every 3 (three) days. Takes one tablet by mouth every three days    FUROSEMIDE (LASIX) 20 MG TABLET    TAKE (1/2) TABLET ONCE A DAY FOR EDEMA, HYPERTENSION.   GLUCOSAMINE-CHONDROIT-VIT C-MN (GLUCOSAMINE 1500 COMPLEX) CAPS    Take one tablet once a day   GLUCOSE BLOOD (TRUETEST TEST) TEST STRIP    E11.22 check blood sugar once daily   LISINOPRIL (PRINIVIL,ZESTRIL) 10 MG TABLET    TAKE 1 TABLET BY MOUTH DAILY.   METFORMIN (GLUCOPHAGE) 500 MG TABLET    TAKE 1 TABLET BY MOUTH TWICE DAILY WITH A MEAL.   METOPROLOL TARTRATE (LOPRESSOR) 25 MG TABLET    TAKE 1 TABLET BY MOUTH TWICE DAILY.   MULTIPLE VITAMIN (MULITIVITAMIN WITH MINERALS) TABS    Take 1 tablet by mouth daily.    OMEGA-3 FATTY ACIDS (FISH OIL) 1000 MG CAPS    Take 1,000 mg by mouth daily.    SIMVASTATIN (ZOCOR) 10 MG TABLET    TAKE ONE TABLET AT BEDTIME.   TRUEPLUS LANCETS 30G MISC    Use to check blood sugar once daily. Dx E11.9  Modified Medications   No medications on file  Discontinued Medications   ACETAMINOPHEN (TYLENOL) 325 MG TABLET    Take 2 tablets (650 mg total) by mouth every 6 (six) hours as needed for mild pain (or Fever >/= 101).     Physical Exam:  Vitals:   07/28/17 1431  BP: (!) 152/86  Pulse: 60  Temp: (!) 97.5 F (36.4 C)  TempSrc: Oral  SpO2: 98%  Weight: 124 lb (56.2 kg)  Height: 5\' 6"  (1.676 m)   Body mass index is 20.01 kg/m.  Physical Exam  Constitutional: He is oriented to person, place, and time. He appears well-developed and well-nourished.  Frail, pleasant, A&O  HENT:  Head: Normocephalic and atraumatic.  Eyes: Conjunctivae and EOM are normal. Pupils are equal, round,  and reactive to light.  Cardiovascular: Normal rate, normal heart sounds and intact distal pulses.  Afib, irregular  Pulmonary/Chest: Effort normal and breath sounds normal. No respiratory distress. He has no wheezes. He exhibits no tenderness.  Abdominal: Soft. Bowel sounds are normal. He exhibits no distension. There is no tenderness.  Musculoskeletal: He exhibits edema and tenderness.  Tenderness noted to bilateral LE, worse on left. thin fragile skin. Brownish skin discoloration  anteriorly with slight redness. No warmth noted. Small varicose vein on the posterior aspect of the left heel, No current bleeding Bilateral feet with decreased pulses    Neurological: He is alert and oriented to person, place, and time.  Skin: Skin is warm and dry. No erythema.  2 cm x 1.25 cm wound to left lower posterior leg. Red base, serous drainage noted.  Smaller superficial abrasion noted to anterior aspect   Psychiatric: He has a normal mood and affect.   Labs reviewed: Basic Metabolic Panel: Recent Labs    11/18/16 1003 02/19/17 1033  NA 141 141  K 4.9 4.3  CL 104 104  CO2 28 25  GLUCOSE 184* 197*  BUN 38* 35*  CREATININE 1.07 0.94  CALCIUM 9.5 9.4  TSH 0.40  --    Liver Function Tests: Recent Labs    11/18/16 1003  AST 14  ALT 17  ALKPHOS 69  BILITOT 1.1  PROT 5.9*  ALBUMIN 3.7   No results for input(s): LIPASE, AMYLASE in the last 8760 hours. No results for input(s): AMMONIA in the last 8760 hours. CBC: Recent Labs    11/18/16 1003 01/30/17 1042 07/21/17 0926 07/22/17 2032  WBC 10.6 7.4 11.2* 7.8  NEUTROABS 8,692* 6,512  --   --   HGB 14.4 13.4 14.4 13.7  HCT 45.6 42.1 45.1 42.1  MCV 88.2 91.5 87.7 89.4  PLT 235 246 172 190   Lipid Panel: Recent Labs    11/18/16 1003  CHOL 184  HDL 53  LDLCALC 107*  TRIG 119  CHOLHDL 3.5   TSH: Recent Labs    11/18/16 1003  TSH 0.40   A1C: Lab Results  Component Value Date   HGBA1C 7.4 (H) 02/19/2017      Assessment/Plan 1. Bleeding from varicose vein Currently controlled, to continue dressing   2. Open wound of left lower extremity, initial encounter Abrasion noted to left posterior leg. To use xeroform to area with 4x4 and kerlex to hold in place. Will get home health nursing to monitor wound and help with dressing changes.  - VAS [place] ABI WITH/WO TBI; Future  3. Debility -increase weakness from being mostly in chair from bleeding varicose vein - Ambulatory referral to [place]  4. Moderate protein-calorie malnutrition ([place]) -ongoing weight loss with decrease in protein noted on lab in may, weight has decreased since then.  -encouraged to increase protein intake and to increase protein supplement drink to twice daily right after meal.   Next appt: 1 week for wound check Jessica K. Harle Battiest  [facility] [phone] 8 am - 5 pm) [phone] (after hours)

## 2017-08-01 DIAGNOSIS — R5381 Other malaise: Secondary | ICD-10-CM | POA: Diagnosis not present

## 2017-08-01 DIAGNOSIS — I482 Chronic atrial fibrillation: Secondary | ICD-10-CM | POA: Diagnosis not present

## 2017-08-01 DIAGNOSIS — E440 Moderate protein-calorie malnutrition: Secondary | ICD-10-CM | POA: Diagnosis not present

## 2017-08-01 DIAGNOSIS — E1142 Type 2 diabetes mellitus with diabetic polyneuropathy: Secondary | ICD-10-CM | POA: Diagnosis not present

## 2017-08-01 DIAGNOSIS — M6281 Muscle weakness (generalized): Secondary | ICD-10-CM | POA: Diagnosis not present

## 2017-08-01 DIAGNOSIS — S91312D Laceration without foreign body, left foot, subsequent encounter: Secondary | ICD-10-CM | POA: Diagnosis not present

## 2017-08-01 DIAGNOSIS — I83892 Varicose veins of left lower extremities with other complications: Secondary | ICD-10-CM | POA: Diagnosis not present

## 2017-08-01 DIAGNOSIS — L89892 Pressure ulcer of other site, stage 2: Secondary | ICD-10-CM | POA: Diagnosis not present

## 2017-08-03 ENCOUNTER — Telehealth: Payer: Self-pay | Admitting: *Deleted

## 2017-08-03 ENCOUNTER — Ambulatory Visit (HOSPITAL_COMMUNITY)
Admission: RE | Admit: 2017-08-03 | Discharge: 2017-08-03 | Disposition: A | Discharge status: Home / Self Care | Payer: Medicare Other | Source: Ambulatory Visit | Attending: Nurse Practitioner | Admitting: Nurse Practitioner

## 2017-08-03 DIAGNOSIS — L89892 Pressure ulcer of other site, stage 2: Secondary | ICD-10-CM | POA: Diagnosis not present

## 2017-08-03 DIAGNOSIS — S81802A Unspecified open wound, left lower leg, initial encounter: Secondary | ICD-10-CM

## 2017-08-03 DIAGNOSIS — E1142 Type 2 diabetes mellitus with diabetic polyneuropathy: Secondary | ICD-10-CM | POA: Diagnosis not present

## 2017-08-03 DIAGNOSIS — I482 Chronic atrial fibrillation: Secondary | ICD-10-CM | POA: Diagnosis not present

## 2017-08-03 DIAGNOSIS — I779 Disorder of arteries and arterioles, unspecified: Secondary | ICD-10-CM | POA: Diagnosis not present

## 2017-08-03 DIAGNOSIS — I83892 Varicose veins of left lower extremities with other complications: Secondary | ICD-10-CM | POA: Diagnosis not present

## 2017-08-03 DIAGNOSIS — S91312D Laceration without foreign body, left foot, subsequent encounter: Secondary | ICD-10-CM | POA: Diagnosis not present

## 2017-08-03 DIAGNOSIS — R5381 Other malaise: Secondary | ICD-10-CM | POA: Diagnosis not present

## 2017-08-03 NOTE — Telephone Encounter (Signed)
Betsy with [facility] called and requested verbal orders for PT 2x6wks. Verbal orders given.

## 2017-08-04 DIAGNOSIS — M170 Bilateral primary osteoarthritis of knee: Secondary | ICD-10-CM | POA: Diagnosis not present

## 2017-08-05 ENCOUNTER — Ambulatory Visit (INDEPENDENT_AMBULATORY_CARE_PROVIDER_SITE_OTHER): Payer: Medicare Other

## 2017-08-05 ENCOUNTER — Encounter: Payer: Self-pay | Admitting: Nurse Practitioner

## 2017-08-05 ENCOUNTER — Ambulatory Visit (INDEPENDENT_AMBULATORY_CARE_PROVIDER_SITE_OTHER): Payer: Medicare Other | Admitting: Nurse Practitioner

## 2017-08-05 VITALS — BP 122/64 | HR 60 | Temp 97.5°F | Ht 66.0 in | Wt 130.0 lb

## 2017-08-05 DIAGNOSIS — N183 Chronic kidney disease, stage 3 unspecified: Secondary | ICD-10-CM

## 2017-08-05 DIAGNOSIS — S81802D Unspecified open wound, left lower leg, subsequent encounter: Secondary | ICD-10-CM

## 2017-08-05 DIAGNOSIS — E1142 Type 2 diabetes mellitus with diabetic polyneuropathy: Secondary | ICD-10-CM | POA: Diagnosis not present

## 2017-08-05 DIAGNOSIS — I83899 Varicose veins of unspecified lower extremities with other complications: Secondary | ICD-10-CM

## 2017-08-05 DIAGNOSIS — I482 Chronic atrial fibrillation: Secondary | ICD-10-CM | POA: Diagnosis not present

## 2017-08-05 DIAGNOSIS — L89892 Pressure ulcer of other site, stage 2: Secondary | ICD-10-CM | POA: Diagnosis not present

## 2017-08-05 DIAGNOSIS — I739 Peripheral vascular disease, unspecified: Secondary | ICD-10-CM | POA: Diagnosis not present

## 2017-08-05 DIAGNOSIS — Z0000 Encounter for general adult medical examination without abnormal findings: Secondary | ICD-10-CM | POA: Diagnosis not present

## 2017-08-05 DIAGNOSIS — R5381 Other malaise: Secondary | ICD-10-CM | POA: Diagnosis not present

## 2017-08-05 DIAGNOSIS — E1122 Type 2 diabetes mellitus with diabetic chronic kidney disease: Secondary | ICD-10-CM

## 2017-08-05 DIAGNOSIS — Z23 Encounter for immunization: Secondary | ICD-10-CM | POA: Diagnosis not present

## 2017-08-05 DIAGNOSIS — I83892 Varicose veins of left lower extremities with other complications: Secondary | ICD-10-CM | POA: Diagnosis not present

## 2017-08-05 DIAGNOSIS — S91312D Laceration without foreign body, left foot, subsequent encounter: Secondary | ICD-10-CM | POA: Diagnosis not present

## 2017-08-05 NOTE — Progress Notes (Signed)
Careteam: Patient Care Team: Lauree Chandler, NP as PCP - General (Nurse Practitioner)  Advanced Directive information    Allergies  Allergen Reactions  . Morphine And Related Nausea And Vomiting  . Oxycodone Nausea And Vomiting    Chief Complaint  Patient presents with  . Follow-up    Pt is being seen for a 1 week follow up on left leg wound.     HPI: Patient is a 84 y.o. male seen in the office today for wound check to left leg. Pt being followed after persistent bleeding of varicose vein (on eliquis) and noted to have another wound to posterior aspect of left leg. Xeroform dressing changes were ordered and [facility]. Nursing as not been out to the house yet but plans on coming tomorrow. varicose vein has bled ~2 other times since last visit.  No increase in pain, swelling or redness noted to Left LE. ABIs were obtained and pt did not tolerate this well. Told caregiver he would not do this again due to pain. ABIs discussed with pt and caregiver which revealed: Resting right ankle-brachial index is within normal range. No evidence of significant right lower extremity arterial disease. The right toe-brachial index is abnormal. Left: Resting left ankle-brachial index indicates moderate left lower extremity arterial disease. Recommended vascular consult and this was discussed at appt today. Pt does not want invasive procedure but caregiver had a lot of questions and reports wife who is POA may want more information.  pts wife died from gangrene so they are concerned over this.   Pt reports he takes blood sugars at home but does not know what they are  States they are trying to increase his protein to promote wound healing which has been difficult but they are able to do somewhat.  Review of Systems:  Review of Systems  Constitutional: Negative for chills, fever and weight loss.       Increases weakness due to decrease mobility due to bleeding vein   Respiratory: Negative  for cough and shortness of breath.   Cardiovascular: Positive for leg swelling. Negative for chest pain.  Musculoskeletal: Negative for myalgias.  Skin:       Scabbed area from previous bleeding heel due to varicose vein.  Skin breakdown to posterior aspect left lower leg  Neurological: Positive for weakness. Negative for dizziness and headaches.    Past Medical History:  Diagnosis Date  . Arthritis    left knee  . Diabetes mellitus    diet controlled, pt does not check cbg  . Diaphragmatic hernia without mention of obstruction or gangrene   . Dizziness and giddiness   . Edema   . H/O hiatal hernia   . Hypercholesteremia   . Hypertension   . Left rotator cuff tear Jul 12, 2012  . Stomach ulcer    years  . Unspecified hereditary and idiopathic peripheral neuropathy   . Unspecified vitamin D deficiency   . Varicose veins    both legs   Past Surgical History:  Procedure Laterality Date  . CARDIOVERSION N/A 02/22/2013   Procedure: CARDIOVERSION;  Surgeon: Darlin Coco, MD;  Location: [facility] ENDOSCOPY;  Service: Cardiovascular;  Laterality: N/A;  . Esopagus strectching  2003 and 3 yrs ago  . MASTECTOMY     bilateral  . ORIF ANKLE FRACTURE Right 09/20/2012   Procedure: OPEN REDUCTION INTERNAL FIXATION (ORIF) ANKLE FRACTURE;  Surgeon: Johnn Hai, MD;  Location: [facility] ORS;  Service: Orthopedics;  Laterality: Right;  .  REVERSE SHOULDER ARTHROPLASTY Right 09/17/2012   Procedure: REVERSE SHOULDER ARTHROPLASTY;  Surgeon: Augustin Schooling, MD;  Location: [place];  Service: Orthopedics;  Laterality: Right;  . SHOULDER OPEN ROTATOR CUFF REPAIR  11/05/2011   Procedure: ROTATOR CUFF REPAIR SHOULDER OPEN;  Surgeon: Tobi Bastos, MD;  Location: [facility] ORS;  Service: Orthopedics;  Laterality: Left;  Left Shoulder Rotator Cuff Repair Open with Graft and Anchors  . TONSILLECTOMY  age 25   Social History:   reports that he quit smoking about 21 years ago. His smoking use included pipe and cigarettes.  He quit after 50.00 years of use. he has never used smokeless tobacco. He reports that he drinks alcohol. He reports that he does not use drugs.  Family History  Problem Relation Age of Onset  . Parkinson's disease Mother   . Hip fracture Mother   . Heart disease Mother   . Alzheimer's disease Mother   . Heart disease Father   . Parkinson's disease Sister   . Heart disease Brother     Medications: Patient's Medications  New Prescriptions   No medications on file  Previous Medications   ACETAMINOPHEN (TYLENOL) 325 MG TABLET    Take 650 mg by mouth every 6 (six) hours as needed.   CALCIUM CARBONATE-VITAMIN D 600-200 MG-UNIT TABS    Take 1 tablet by mouth 2 (two) times daily. Take 1 tablet daily for vitamin supplement.   ELIQUIS 2.5 MG TABS TABLET    TAKE 1 TABLET BY MOUTH TWICE DAILY.   FERROUS SULFATE 325 (65 FE) MG TABLET    Take 325 mg by mouth every 3 (three) days. Takes one tablet by mouth every three days    FUROSEMIDE (LASIX) 20 MG TABLET    TAKE (1/2) TABLET ONCE A DAY FOR EDEMA, HYPERTENSION.   GLUCOSAMINE-CHONDROIT-VIT C-MN (GLUCOSAMINE 1500 COMPLEX) CAPS    Take one tablet once a day   GLUCOSE BLOOD (TRUETEST TEST) TEST STRIP    E11.22 check blood sugar once daily   LISINOPRIL (PRINIVIL,ZESTRIL) 10 MG TABLET    TAKE 1 TABLET BY MOUTH DAILY.   METFORMIN (GLUCOPHAGE) 500 MG TABLET    TAKE 1 TABLET BY MOUTH TWICE DAILY WITH A MEAL.   METOPROLOL TARTRATE (LOPRESSOR) 25 MG TABLET    TAKE 1 TABLET BY MOUTH TWICE DAILY.   MULTIPLE VITAMIN (MULITIVITAMIN WITH MINERALS) TABS    Take 1 tablet by mouth daily.    OMEGA-3 FATTY ACIDS (FISH OIL) 1000 MG CAPS    Take 1,000 mg by mouth daily.    SIMVASTATIN (ZOCOR) 10 MG TABLET    TAKE ONE TABLET AT BEDTIME.   TRUEPLUS LANCETS 30G MISC    Use to check blood sugar once daily. Dx E11.9  Modified Medications   No medications on file  Discontinued Medications   No medications on file     Physical Exam:  Vitals:   08/05/17 1514  BP:  122/64  Pulse: 60  Temp: (!) 97.5 F (36.4 C)  TempSrc: Oral  SpO2: 97%  Weight: 130 lb (59 kg)  Height: 5\' 6"  (1.676 m)   Body mass index is 20.98 kg/m.  Physical Exam  Constitutional: He is oriented to person, place, and time. He appears well-developed and well-nourished.  Frail, pleasant, A&O  HENT:  Head: Normocephalic and atraumatic.  Eyes: Conjunctivae and EOM are normal. Pupils are equal, round, and reactive to light.  Cardiovascular: Normal rate, normal heart sounds and intact distal pulses.  Afib, irregular  Pulmonary/Chest: Effort  normal and breath sounds normal. No respiratory distress.  Abdominal: Soft. Bowel sounds are normal. He exhibits no distension. There is no tenderness.  Musculoskeletal: He exhibits edema and tenderness.  Tenderness noted to bilateral LE, worse on left. thin fragile skin. Brownish skin discoloration anteriorly with slight redness. No warmth noted. Small varicose vein on the posterior aspect of the left heel, No current bleeding Bilateral feet with decreased pulses    Neurological: He is alert and oriented to person, place, and time.  Skin: Skin is warm and dry. No erythema.  2 cm x 1 cm wound to left lower posterior leg. Red base, no drainage  Smaller superficial abrasion noted to anterior aspect   Psychiatric: He has a normal mood and affect.    Labs reviewed: Basic Metabolic Panel: Recent Labs    11/18/16 1003 02/19/17 1033  NA 141 141  K 4.9 4.3  CL 104 104  CO2 28 25  GLUCOSE 184* 197*  BUN 38* 35*  CREATININE 1.07 0.94  CALCIUM 9.5 9.4  TSH 0.40  --    Liver Function Tests: Recent Labs    11/18/16 1003  AST 14  ALT 17  ALKPHOS 69  BILITOT 1.1  PROT 5.9*  ALBUMIN 3.7   No results for input(s): LIPASE, AMYLASE in the last 8760 hours. No results for input(s): AMMONIA in the last 8760 hours. CBC: Recent Labs    11/18/16 1003 01/30/17 1042 07/21/17 0926 07/22/17 2032  WBC 10.6 7.4 11.2* 7.8  NEUTROABS 8,692*  6,512  --   --   HGB 14.4 13.4 14.4 13.7  HCT 45.6 42.1 45.1 42.1  MCV 88.2 91.5 87.7 89.4  PLT 235 246 172 190   Lipid Panel: Recent Labs    11/18/16 1003  CHOL 184  HDL 53  LDLCALC 107*  TRIG 119  CHOLHDL 3.5   TSH: Recent Labs    11/18/16 1003  TSH 0.40   A1C: Lab Results  Component Value Date   HGBA1C 7.4 (H) 02/19/2017     Assessment/Plan 1. PAD (peripheral artery disease) (HCC) -ABIs reviewed with pt and caregiver, caregiver reports his wife who is POA will have a lot more questions regarding possible treatment options, does not want aggressive treatment or revascularization but will refer to vascular at this time for evaluation.  - Ambulatory referral to Vascular Surgery  2. Open wound of left lower extremity, subsequent encounter Improving, home health nursing plans start coming out to home. To cont current regimen with wound care.  - Ambulatory referral to Vascular Surgery  3. Bleeding from varicose vein -stable at this time, without bleeding.  continues to have intermediate bleeding episodes.   4. CKD (chronic kidney disease) stage 3, GFR 30-59 ml/min (HCC) Encourage proper hydration and to avoid NSAIDS (Aleve, Advil, Motrin, Ibuprofen)  - COMPLETE METABOLIC PANEL WITH GFR  5. Type 2 diabetes mellitus with stage 3 chronic kidney disease, without long-term current use of insulin (HCC) -continues on metformin 500 mg daily, will follow up A1c - Hemoglobin A1c  Next appt: 08/19/2017 for wound check.  Carlos American. Harle Battiest  [facility] [phone] 8 am - 5 pm) [phone] (after hours)

## 2017-08-05 NOTE — Patient Instructions (Signed)
Logan Bean , Thank you for taking time to come for your Medicare Wellness Visit. I appreciate your ongoing commitment to your health goals. Please review the following plan we discussed and let me know if I can assist you in the future.   Screening recommendations/referrals: Colonoscopy excluded, you are over age 82 Recommended yearly ophthalmology/optometry visit for glaucoma screening and checkup Recommended yearly dental visit for hygiene and checkup  Vaccinations: Influenza vaccine given today Pneumococcal vaccine up to date Tdap vaccine up to date, due 07/12/2021 Shingles vaccine due, declined    Advanced directives: in chart  Conditions/risks identified: none  Next appointment: Logan Dense, RN 08/06/2018 @ 1:45pm  Preventive Care 65 Years and Older, Male Preventive care refers to lifestyle choices and visits with your health care provider that can promote health and wellness. What does preventive care include?  A yearly physical exam. This is also called an annual well check.  Dental exams once or twice a year.  Routine eye exams. Ask your health care provider how often you should have your eyes checked.  Personal lifestyle choices, including:  Daily care of your teeth and gums.  Regular physical activity.  Eating a healthy diet.  Avoiding tobacco and drug use.  Limiting alcohol use.  Practicing safe sex.  Taking low doses of aspirin every day.  Taking vitamin and mineral supplements as recommended by your health care provider. What happens during an annual well check? The services and screenings done by your health care provider during your annual well check will depend on your age, overall health, lifestyle risk factors, and family history of disease. Counseling  Your health care provider may ask you questions about your:  Alcohol use.  Tobacco use.  Drug use.  Emotional well-being.  Home and relationship well-being.  Sexual activity.  Eating  habits.  History of falls.  Memory and ability to understand (cognition).  Work and work Statistician. Screening  You may have the following tests or measurements:  Height, weight, and BMI.  Blood pressure.  Lipid and cholesterol levels. These may be checked every 5 years, or more frequently if you are over 39 years old.  Skin check.  Lung cancer screening. You may have this screening every year starting at age 5 if you have a 30-pack-year history of smoking and currently smoke or have quit within the past 15 years.  Fecal occult blood test (FOBT) of the stool. You may have this test every year starting at age 34.  Flexible sigmoidoscopy or colonoscopy. You may have a sigmoidoscopy every 5 years or a colonoscopy every 10 years starting at age 55.  Prostate cancer screening. Recommendations will vary depending on your family history and other risks.  Hepatitis C blood test.  Hepatitis B blood test.  Sexually transmitted disease (STD) testing.  Diabetes screening. This is done by checking your blood sugar (glucose) after you have not eaten for a while (fasting). You may have this done every 1-3 years.  Abdominal aortic aneurysm (AAA) screening. You may need this if you are a current or former smoker.  Osteoporosis. You may be screened starting at age 43 if you are at high risk. Talk with your health care provider about your test results, treatment options, and if necessary, the need for more tests. Vaccines  Your health care provider may recommend certain vaccines, such as:  Influenza vaccine. This is recommended every year.  Tetanus, diphtheria, and acellular pertussis (Tdap, Td) vaccine. You may need a Td booster every 10  years.  Zoster vaccine. You may need this after age 94.  Pneumococcal 13-valent conjugate (PCV13) vaccine. One dose is recommended after age 76.  Pneumococcal polysaccharide (PPSV23) vaccine. One dose is recommended after age 64. Talk to your health  care provider about which screenings and vaccines you need and how often you need them. This information is not intended to replace advice given to you by your health care provider. Make sure you discuss any questions you have with your health care provider. Document Released: 07/27/2015 Document Revised: 03/19/2016 Document Reviewed: 05/01/2015 Elsevier Interactive Patient Education  2017 [place] Prevention in the Home Falls can cause injuries. They can happen to people of all ages. There are many things you can do to make your home safe and to help prevent falls. What can I do on the outside of my home?  Regularly fix the edges of walkways and driveways and fix any cracks.  Remove anything that might make you trip as you walk through a door, such as a raised step or threshold.  Trim any bushes or trees on the path to your home.  Use bright outdoor lighting.  Clear any walking paths of anything that might make someone trip, such as rocks or tools.  Regularly check to see if handrails are loose or broken. Make sure that both sides of any steps have handrails.  Any raised decks and porches should have guardrails on the edges.  Have any leaves, snow, or ice cleared regularly.  Use sand or salt on walking paths during winter.  Clean up any spills in your garage right away. This includes oil or grease spills. What can I do in the bathroom?  Use night lights.  Install grab bars by the toilet and in the tub and shower. Do not use towel bars as grab bars.  Use non-skid mats or decals in the tub or shower.  If you need to sit down in the shower, use a plastic, non-slip stool.  Keep the floor dry. Clean up any water that spills on the floor as soon as it happens.  Remove soap buildup in the tub or shower regularly.  Attach bath mats securely with double-sided non-slip rug tape.  Do not have throw rugs and other things on the floor that can make you trip. What can I do  in the bedroom?  Use night lights.  Make sure that you have a light by your bed that is easy to reach.  Do not use any sheets or blankets that are too big for your bed. They should not hang down onto the floor.  Have a firm chair that has side arms. You can use this for support while you get dressed.  Do not have throw rugs and other things on the floor that can make you trip. What can I do in the kitchen?  Clean up any spills right away.  Avoid walking on wet floors.  Keep items that you use a lot in easy-to-reach places.  If you need to reach something above you, use a strong step stool that has a grab bar.  Keep electrical cords out of the way.  Do not use floor polish or wax that makes floors slippery. If you must use wax, use non-skid floor wax.  Do not have throw rugs and other things on the floor that can make you trip. What can I do with my stairs?  Do not leave any items on the stairs.  Make sure that  there are handrails on both sides of the stairs and use them. Fix handrails that are broken or loose. Make sure that handrails are as long as the stairways.  Check any carpeting to make sure that it is firmly attached to the stairs. Fix any carpet that is loose or worn.  Avoid having throw rugs at the top or bottom of the stairs. If you do have throw rugs, attach them to the floor with carpet tape.  Make sure that you have a light switch at the top of the stairs and the bottom of the stairs. If you do not have them, ask someone to add them for you. What else can I do to help prevent falls?  Wear shoes that:  Do not have high heels.  Have rubber bottoms.  Are comfortable and fit you well.  Are closed at the toe. Do not wear sandals.  If you use a stepladder:  Make sure that it is fully opened. Do not climb a closed stepladder.  Make sure that both sides of the stepladder are locked into place.  Ask someone to hold it for you, if possible.  Clearly mark  and make sure that you can see:  Any grab bars or handrails.  First and last steps.  Where the edge of each step is.  Use tools that help you move around (mobility aids) if they are needed. These include:  Canes.  Walkers.  Scooters.  Crutches.  Turn on the lights when you go into a dark area. Replace any light bulbs as soon as they burn out.  Set up your furniture so you have a clear path. Avoid moving your furniture around.  If any of your floors are uneven, fix them.  If there are any pets around you, be aware of where they are.  Review your medicines with your doctor. Some medicines can make you feel dizzy. This can increase your chance of falling. Ask your doctor what other things that you can do to help prevent falls. This information is not intended to replace advice given to you by your health care provider. Make sure you discuss any questions you have with your health care provider. Document Released: 04/26/2009 Document Revised: 12/06/2015 Document Reviewed: 08/04/2014 Elsevier Interactive Patient Education  2017 [employer].

## 2017-08-05 NOTE — Patient Instructions (Signed)
Cont protein for wound healing  Cont dressing changes as previously directed   Avoid pressure to area  Elevate legs as tolerates

## 2017-08-05 NOTE — Progress Notes (Signed)
Subjective:   Logan Bean is a 84 y.o. male who presents for Medicare Annual/Subsequent preventive examination.  Last AWV-05/20/2016       Objective:    Vitals: BP 122/64 (BP Location: Right Arm, Patient Position: Sitting)   Pulse 60   Temp (!) 97.5 F (36.4 C) (Oral)   Ht 5\' 6"  (1.676 m)   Wt 130 lb (59 kg)   SpO2 97%   BMI 20.98 kg/m   Body mass index is 20.98 kg/m.  Advanced Directives 08/05/2017 07/22/2017 02/19/2017 01/30/2017 11/18/2016 08/19/2016 07/01/2016  Does Patient Have a Medical Advance Directive? Yes Yes Yes Yes Yes Yes No  Type of [place] administrator Power of [place] of [place] of [place] -  Does patient want to make changes to medical advance directive? Yes (Inpatient - patient requests chaplain consult to change a medical advance directive) - - - - - -  Copy of [place] in Chart? Yes Yes - Yes Yes Yes -  Would patient like information on creating a medical advance directive? - - - - - - -  Pre-existing out of facility DNR order (yellow form or pink MOST form) - - - - - - -    Tobacco Social History   Tobacco Use  Smoking Status Former Smoker  . Years: 50.00  . Types: Pipe, Cigarettes  . Last attempt to quit: 07/14/1996  . Years since quitting: 21.0  Smokeless Tobacco Never Used     Counseling given: Not Answered   Clinical Intake:  Pre-visit preparation completed: No  Pain : No/denies pain     Diabetes: No  How often do you need to have someone help you when you read instructions, pamphlets, or other written materials from your doctor or pharmacy?: 1 - Never What is the last grade level you completed in school?: 11th grade  Interpreter Needed?: No  Information entered by :: [name], RN  Past Medical History:  Diagnosis Date  . Arthritis    left knee  . Diabetes mellitus    diet  controlled, pt does not check cbg  . Diaphragmatic hernia without mention of obstruction or gangrene   . Dizziness and giddiness   . Edema   . H/O hiatal hernia   . Hypercholesteremia   . Hypertension   . Left rotator cuff tear Jul 12, 2012  . Stomach ulcer    years  . Unspecified hereditary and idiopathic peripheral neuropathy   . Unspecified vitamin D deficiency   . Varicose veins    both legs   Past Surgical History:  Procedure Laterality Date  . CARDIOVERSION N/A 02/22/2013   Procedure: CARDIOVERSION;  Surgeon: Darlin Coco, MD;  Location: [facility] ENDOSCOPY;  Service: Cardiovascular;  Laterality: N/A;  . Esopagus strectching  2003 and 3 yrs ago  . MASTECTOMY     bilateral  . ORIF ANKLE FRACTURE Right 09/20/2012   Procedure: OPEN REDUCTION INTERNAL FIXATION (ORIF) ANKLE FRACTURE;  Surgeon: Johnn Hai, MD;  Location: [facility] ORS;  Service: Orthopedics;  Laterality: Right;  . REVERSE SHOULDER ARTHROPLASTY Right 09/17/2012   Procedure: REVERSE SHOULDER ARTHROPLASTY;  Surgeon: Augustin Schooling, MD;  Location: [place];  Service: Orthopedics;  Laterality: Right;  . SHOULDER OPEN ROTATOR CUFF REPAIR  11/05/2011   Procedure: ROTATOR CUFF REPAIR SHOULDER OPEN;  Surgeon: Tobi Bastos, MD;  Location: [facility] ORS;  Service: Orthopedics;  Laterality: Left;  Left Shoulder Rotator Cuff Repair Open with Graft and Anchors  . TONSILLECTOMY  age 49   Family History  Problem Relation Age of Onset  . Parkinson's disease Mother   . Hip fracture Mother   . Heart disease Mother   . Alzheimer's disease Mother   . Heart disease Father   . Parkinson's disease Sister   . Heart disease Brother    Social History   Socioeconomic History  . Marital status: Widowed    Spouse name: None  . Number of children: 0  . Years of education: None  . Highest education level: None  Social Needs  . Financial resource strain: Not hard at all  . Food insecurity - worry: Never true  . Food insecurity - inability: Never  true  . Transportation needs - medical: No  . Transportation needs - non-medical: No  Occupational History  . None  Tobacco Use  . Smoking status: Former Smoker    Years: 50.00    Types: Pipe, Cigarettes    Last attempt to quit: 07/14/1996    Years since quitting: 21.0  . Smokeless tobacco: Never Used  Substance and Sexual Activity  . Alcohol use: Yes    Comment: occasional beer  . Drug use: No  . Sexual activity: No  Other Topics Concern  . None  Social History Narrative   Lives alone.    Outpatient Encounter Medications as of 08/05/2017  Medication Sig  . acetaminophen (TYLENOL) 325 MG tablet Take 650 mg by mouth every 6 (six) hours as needed.  . Calcium Carbonate-Vitamin D 600-200 MG-UNIT TABS Take 1 tablet by mouth 2 (two) times daily. Take 1 tablet daily for vitamin supplement.  Marland Kitchen ELIQUIS 2.5 MG TABS tablet TAKE 1 TABLET BY MOUTH TWICE DAILY.  . ferrous sulfate 325 (65 FE) MG tablet Take 325 mg by mouth every 3 (three) days. Takes one tablet by mouth every three days   . furosemide (LASIX) 20 MG tablet TAKE (1/2) TABLET ONCE A DAY FOR EDEMA, HYPERTENSION.  Marland Kitchen Glucosamine-Chondroit-Vit C-Mn (GLUCOSAMINE 1500 COMPLEX) CAPS Take one tablet once a day  . glucose blood (TRUETEST TEST) test strip E11.22 check blood sugar once daily  . lisinopril (PRINIVIL,ZESTRIL) 10 MG tablet TAKE 1 TABLET BY MOUTH DAILY.  . metFORMIN (GLUCOPHAGE) 500 MG tablet TAKE 1 TABLET BY MOUTH TWICE DAILY WITH A MEAL.  . metoprolol tartrate (LOPRESSOR) 25 MG tablet TAKE 1 TABLET BY MOUTH TWICE DAILY.  . Multiple Vitamin (MULITIVITAMIN WITH MINERALS) TABS Take 1 tablet by mouth daily.   . Omega-3 Fatty Acids (FISH OIL) 1000 MG CAPS Take 1,000 mg by mouth daily.   . simvastatin (ZOCOR) 10 MG tablet TAKE ONE TABLET AT BEDTIME.  Marland Kitchen TRUEPLUS LANCETS 30G MISC Use to check blood sugar once daily. Dx E11.9   No facility-administered encounter medications on file as of 08/05/2017.     Activities of Daily Living In  your present state of health, do you have any difficulty performing the following activities: 08/05/2017  Hearing? N  Vision? N  Difficulty concentrating or making decisions? Y  Walking or climbing stairs? Y  Dressing or bathing? N  Doing errands, shopping? Y  Preparing Food and eating ? N  Using the Toilet? N  Managing your Medications? N  Managing your Finances? N  Housekeeping or managing your Housekeeping? N  Some recent data might be hidden    Patient Care Team: Lauree Chandler, NP as PCP - General (Nurse Practitioner)   Assessment:  This is a routine wellness examination for [place].  Exercise Activities and Dietary recommendations Current Exercise Habits: The patient does not participate in regular exercise at present, Exercise limited by: None identified  Goals    None      Fall Risk Fall Risk  08/05/2017 07/28/2017 07/22/2017 02/19/2017 01/30/2017  Falls in the past year? No No No No No  Comment - - - - -  Number falls in past yr: - - - - -  Comment - - - - -  Injury with Fall? - - - - -  Comment - - - - -  Risk Factor Category  - - - - -  Risk for fall due to : - - - - -  Follow up - - - - -   Is the patient's home free of loose throw rugs in walkways, pet beds, electrical cords, etc?   yes      Grab bars in the bathroom? yes      Handrails on the stairs?   yes      Adequate lighting?   yes  Timed Get Up and Go Performed: unable to perform today due to wound on foot  Depression Screen PHQ 2/9 Scores 08/05/2017 01/30/2017 05/20/2016 11/21/2015  PHQ - 2 Score 0 0 0 0    Cognitive Function MMSE - Mini Mental State Exam 05/20/2016 02/14/2016 05/16/2014 05/16/2014  Orientation to time 5 4 5 5   Orientation to Place 4 5 5 5   Registration 3 3 3 3   Attention/ Calculation 3 5 5 5   Recall 1 1 0 0  Language- name 2 objects 2 2 2 2   Language- repeat 1 1 1 1   Language- follow 3 step command 3 3 3 3   Language- read & follow direction 1 1 1 1   Write a sentence 1 1 1 1     Copy design 1 1 1 1   Total score 25 27 27 27         Immunization History  Administered Date(s) Administered  . Influenza Split 04/14/2011, 03/23/2012  . Influenza,inj,Quad PF,6+ Mos 05/25/2013, 05/16/2014, 04/24/2015, 05/20/2016  . Pneumococcal Conjugate-13 07/11/2014  . Pneumococcal Polysaccharide-23 07/14/2008  . Td 07/14/2010  . Tdap 07/13/2011    Qualifies for Shingles Vaccine? Yes, educated and declined  Screening Tests Health Maintenance  Topic Date Due  . FOOT EXAM  11/20/2016  . INFLUENZA VACCINE  02/11/2017  . OPHTHALMOLOGY EXAM  04/29/2017  . HEMOGLOBIN A1C  08/22/2017  . TETANUS/TDAP  07/12/2021  . PNA vac Low Risk Adult  Completed   Cancer Screenings: Lung: Low Dose CT Chest recommended if Age 66-80 years, 30 pack-year currently smoking OR have quit w/in 15years. Patient does not qualify. Colorectal: up to date  Additional Screenings:  Hepatitis B/HIV/Syphillis:declined Hepatitis C Screening: declined    Plan:    I have personally reviewed and addressed the Medicare Annual Wellness questionnaire and have noted the following in the patient's chart:  A. Medical and social history B. Use of alcohol, tobacco or illicit drugs  C. Current medications and supplements D. Functional ability and status E.  Nutritional status F.  Physical activity G. Advance directives H. List of other physicians I.  Hospitalizations, surgeries, and ER visits in previous 12 months J.  [place] to include hearing, vision, cognitive, depression L. Referrals and appointments - none  In addition, I have reviewed and discussed with patient certain preventive protocols, quality metrics, and best practice recommendations. A written personalized care plan for  preventive services as well as general preventive health recommendations were provided to patient.  See attached scanned questionnaire for additional information.   Signed,   [name], RN Nurse Health  Advisor   Quick Notes   Health Maintenance: Saw eye doctor spring 2018. Flu vaccine given today. Shingrix declined     Abnormal Screen: MMSE 27/30, passed clock drawing     Patient Concerns: none     Nurse Concerns: none

## 2017-08-06 ENCOUNTER — Encounter (HOSPITAL_COMMUNITY): Payer: Medicare Other

## 2017-08-06 LAB — HEMOGLOBIN A1C
EAG (MMOL/L): 9.2 (calc)
Hgb A1c MFr Bld: 7.4 % of total Hgb — ABNORMAL HIGH (ref <5.7)
Mean Plasma Glucose: 166 (calc)

## 2017-08-06 LAB — COMPLETE METABOLIC PANEL WITH GFR
AG RATIO: 1.8 (calc) (ref 1.0–2.5)
ALT: 18 U/L (ref 9–46)
AST: 14 U/L (ref 10–35)
Albumin: 3.9 g/dL (ref 3.6–5.1)
Alkaline phosphatase (APISO): 73 U/L (ref 40–115)
BILIRUBIN TOTAL: 0.6 mg/dL (ref 0.2–1.2)
BUN / CREAT RATIO: 66 (calc) — AB (ref 6–22)
BUN: 56 mg/dL — ABNORMAL HIGH (ref 7–25)
CHLORIDE: 102 mmol/L (ref 98–110)
CO2: 24 mmol/L (ref 20–32)
Calcium: 10.2 mg/dL (ref 8.6–10.3)
Creat: 0.85 mg/dL (ref 0.70–1.11)
GFR, EST AFRICAN AMERICAN: 86 mL/min/{1.73_m2} (ref >=60)
GFR, Est Non African American: 75 mL/min/{1.73_m2} (ref >=60)
Globulin: 2.2 g/dL (calc) (ref 1.9–3.7)
Glucose, Bld: 292 mg/dL — ABNORMAL HIGH (ref 65–139)
POTASSIUM: 5.3 mmol/L (ref 3.5–5.3)
SODIUM: 136 mmol/L (ref 135–146)
TOTAL PROTEIN: 6.1 g/dL (ref 6.1–8.1)

## 2017-08-07 ENCOUNTER — Telehealth: Payer: Self-pay

## 2017-08-07 DIAGNOSIS — I83892 Varicose veins of left lower extremities with other complications: Secondary | ICD-10-CM | POA: Diagnosis not present

## 2017-08-07 DIAGNOSIS — L89892 Pressure ulcer of other site, stage 2: Secondary | ICD-10-CM | POA: Diagnosis not present

## 2017-08-07 DIAGNOSIS — S91312D Laceration without foreign body, left foot, subsequent encounter: Secondary | ICD-10-CM | POA: Diagnosis not present

## 2017-08-07 DIAGNOSIS — E1142 Type 2 diabetes mellitus with diabetic polyneuropathy: Secondary | ICD-10-CM | POA: Diagnosis not present

## 2017-08-07 DIAGNOSIS — R5381 Other malaise: Secondary | ICD-10-CM | POA: Diagnosis not present

## 2017-08-07 DIAGNOSIS — I482 Chronic atrial fibrillation: Secondary | ICD-10-CM | POA: Diagnosis not present

## 2017-08-07 NOTE — Telephone Encounter (Signed)
Per Logan Bean and Dr. Mariea Clonts- patient should never use compression socks. Pt has moderate artrial disease in left leg.   I tried to speak with Logan Bean but the phones kept cutting in and out. I attempted to call back and we still could not understand one another. Will call again later.

## 2017-08-07 NOTE — Telephone Encounter (Signed)
I spoke with Jeani Hawking and she verbalized understanding.

## 2017-08-10 DIAGNOSIS — R5381 Other malaise: Secondary | ICD-10-CM | POA: Diagnosis not present

## 2017-08-10 DIAGNOSIS — I83892 Varicose veins of left lower extremities with other complications: Secondary | ICD-10-CM | POA: Diagnosis not present

## 2017-08-10 DIAGNOSIS — S91312D Laceration without foreign body, left foot, subsequent encounter: Secondary | ICD-10-CM | POA: Diagnosis not present

## 2017-08-10 DIAGNOSIS — E1142 Type 2 diabetes mellitus with diabetic polyneuropathy: Secondary | ICD-10-CM | POA: Diagnosis not present

## 2017-08-10 DIAGNOSIS — I482 Chronic atrial fibrillation: Secondary | ICD-10-CM | POA: Diagnosis not present

## 2017-08-10 DIAGNOSIS — L89892 Pressure ulcer of other site, stage 2: Secondary | ICD-10-CM | POA: Diagnosis not present

## 2017-08-11 DIAGNOSIS — E1142 Type 2 diabetes mellitus with diabetic polyneuropathy: Secondary | ICD-10-CM | POA: Diagnosis not present

## 2017-08-11 DIAGNOSIS — I83892 Varicose veins of left lower extremities with other complications: Secondary | ICD-10-CM | POA: Diagnosis not present

## 2017-08-11 DIAGNOSIS — R5381 Other malaise: Secondary | ICD-10-CM | POA: Diagnosis not present

## 2017-08-11 DIAGNOSIS — L89892 Pressure ulcer of other site, stage 2: Secondary | ICD-10-CM | POA: Diagnosis not present

## 2017-08-11 DIAGNOSIS — I482 Chronic atrial fibrillation: Secondary | ICD-10-CM | POA: Diagnosis not present

## 2017-08-11 DIAGNOSIS — S91312D Laceration without foreign body, left foot, subsequent encounter: Secondary | ICD-10-CM | POA: Diagnosis not present

## 2017-08-12 ENCOUNTER — Telehealth: Payer: Self-pay | Admitting: *Deleted

## 2017-08-12 DIAGNOSIS — S81802D Unspecified open wound, left lower leg, subsequent encounter: Secondary | ICD-10-CM

## 2017-08-12 DIAGNOSIS — I739 Peripheral vascular disease, unspecified: Secondary | ICD-10-CM

## 2017-08-12 NOTE — Telephone Encounter (Signed)
Referral placed and Nurse notified.

## 2017-08-12 NOTE — Telephone Encounter (Signed)
Okay this sounds good, it may take a while to get into wound care so we can go ahead and place referral

## 2017-08-12 NOTE — Telephone Encounter (Signed)
Caryl Pina with [facility] called and stated that patient has 2 new wounds on Left Leg. Stated that the patient is requesting a referral to the [facility]. Please Advise.

## 2017-08-14 DIAGNOSIS — L89892 Pressure ulcer of other site, stage 2: Secondary | ICD-10-CM | POA: Diagnosis not present

## 2017-08-14 DIAGNOSIS — I83892 Varicose veins of left lower extremities with other complications: Secondary | ICD-10-CM | POA: Diagnosis not present

## 2017-08-14 DIAGNOSIS — E1142 Type 2 diabetes mellitus with diabetic polyneuropathy: Secondary | ICD-10-CM | POA: Diagnosis not present

## 2017-08-14 DIAGNOSIS — R5381 Other malaise: Secondary | ICD-10-CM | POA: Diagnosis not present

## 2017-08-14 DIAGNOSIS — S91312D Laceration without foreign body, left foot, subsequent encounter: Secondary | ICD-10-CM | POA: Diagnosis not present

## 2017-08-14 DIAGNOSIS — I482 Chronic atrial fibrillation: Secondary | ICD-10-CM | POA: Diagnosis not present

## 2017-08-17 DIAGNOSIS — I83892 Varicose veins of left lower extremities with other complications: Secondary | ICD-10-CM | POA: Diagnosis not present

## 2017-08-17 DIAGNOSIS — R5381 Other malaise: Secondary | ICD-10-CM | POA: Diagnosis not present

## 2017-08-17 DIAGNOSIS — E1142 Type 2 diabetes mellitus with diabetic polyneuropathy: Secondary | ICD-10-CM | POA: Diagnosis not present

## 2017-08-17 DIAGNOSIS — I482 Chronic atrial fibrillation: Secondary | ICD-10-CM | POA: Diagnosis not present

## 2017-08-17 DIAGNOSIS — S91312D Laceration without foreign body, left foot, subsequent encounter: Secondary | ICD-10-CM | POA: Diagnosis not present

## 2017-08-17 DIAGNOSIS — L89892 Pressure ulcer of other site, stage 2: Secondary | ICD-10-CM | POA: Diagnosis not present

## 2017-08-18 ENCOUNTER — Ambulatory Visit: Payer: Medicare Other

## 2017-08-18 DIAGNOSIS — L89892 Pressure ulcer of other site, stage 2: Secondary | ICD-10-CM | POA: Diagnosis not present

## 2017-08-18 DIAGNOSIS — R5381 Other malaise: Secondary | ICD-10-CM | POA: Diagnosis not present

## 2017-08-18 DIAGNOSIS — I482 Chronic atrial fibrillation: Secondary | ICD-10-CM | POA: Diagnosis not present

## 2017-08-18 DIAGNOSIS — I83892 Varicose veins of left lower extremities with other complications: Secondary | ICD-10-CM | POA: Diagnosis not present

## 2017-08-18 DIAGNOSIS — S91312D Laceration without foreign body, left foot, subsequent encounter: Secondary | ICD-10-CM | POA: Diagnosis not present

## 2017-08-18 DIAGNOSIS — E1142 Type 2 diabetes mellitus with diabetic polyneuropathy: Secondary | ICD-10-CM | POA: Diagnosis not present

## 2017-08-19 ENCOUNTER — Ambulatory Visit: Payer: Medicare Other | Admitting: Nurse Practitioner

## 2017-08-19 ENCOUNTER — Encounter (HOSPITAL_BASED_OUTPATIENT_CLINIC_OR_DEPARTMENT_OTHER): Payer: Medicare Other | Attending: Internal Medicine

## 2017-08-19 DIAGNOSIS — E11622 Type 2 diabetes mellitus with other skin ulcer: Secondary | ICD-10-CM | POA: Diagnosis not present

## 2017-08-19 DIAGNOSIS — I509 Heart failure, unspecified: Secondary | ICD-10-CM | POA: Diagnosis not present

## 2017-08-19 DIAGNOSIS — L97422 Non-pressure chronic ulcer of left heel and midfoot with fat layer exposed: Secondary | ICD-10-CM | POA: Insufficient documentation

## 2017-08-19 DIAGNOSIS — I130 Hypertensive heart and chronic kidney disease with heart failure and stage 1 through stage 4 chronic kidney disease, or unspecified chronic kidney disease: Secondary | ICD-10-CM | POA: Insufficient documentation

## 2017-08-19 DIAGNOSIS — R609 Edema, unspecified: Secondary | ICD-10-CM | POA: Insufficient documentation

## 2017-08-19 DIAGNOSIS — L97322 Non-pressure chronic ulcer of left ankle with fat layer exposed: Secondary | ICD-10-CM | POA: Insufficient documentation

## 2017-08-19 DIAGNOSIS — N183 Chronic kidney disease, stage 3 (moderate): Secondary | ICD-10-CM | POA: Diagnosis not present

## 2017-08-19 DIAGNOSIS — I872 Venous insufficiency (chronic) (peripheral): Secondary | ICD-10-CM | POA: Diagnosis not present

## 2017-08-19 DIAGNOSIS — Z7984 Long term (current) use of oral hypoglycemic drugs: Secondary | ICD-10-CM | POA: Insufficient documentation

## 2017-08-19 DIAGNOSIS — L97529 Non-pressure chronic ulcer of other part of left foot with unspecified severity: Secondary | ICD-10-CM | POA: Diagnosis not present

## 2017-08-19 DIAGNOSIS — E1122 Type 2 diabetes mellitus with diabetic chronic kidney disease: Secondary | ICD-10-CM | POA: Diagnosis not present

## 2017-08-19 DIAGNOSIS — I482 Chronic atrial fibrillation: Secondary | ICD-10-CM | POA: Insufficient documentation

## 2017-08-19 DIAGNOSIS — Z87891 Personal history of nicotine dependence: Secondary | ICD-10-CM | POA: Diagnosis not present

## 2017-08-19 DIAGNOSIS — L97222 Non-pressure chronic ulcer of left calf with fat layer exposed: Secondary | ICD-10-CM | POA: Diagnosis not present

## 2017-08-19 DIAGNOSIS — Z7901 Long term (current) use of anticoagulants: Secondary | ICD-10-CM | POA: Diagnosis not present

## 2017-08-21 DIAGNOSIS — E1142 Type 2 diabetes mellitus with diabetic polyneuropathy: Secondary | ICD-10-CM | POA: Diagnosis not present

## 2017-08-21 DIAGNOSIS — R5381 Other malaise: Secondary | ICD-10-CM | POA: Diagnosis not present

## 2017-08-21 DIAGNOSIS — I482 Chronic atrial fibrillation: Secondary | ICD-10-CM | POA: Diagnosis not present

## 2017-08-21 DIAGNOSIS — I83892 Varicose veins of left lower extremities with other complications: Secondary | ICD-10-CM | POA: Diagnosis not present

## 2017-08-21 DIAGNOSIS — L89892 Pressure ulcer of other site, stage 2: Secondary | ICD-10-CM | POA: Diagnosis not present

## 2017-08-21 DIAGNOSIS — S91312D Laceration without foreign body, left foot, subsequent encounter: Secondary | ICD-10-CM | POA: Diagnosis not present

## 2017-08-24 ENCOUNTER — Encounter (HOSPITAL_COMMUNITY): Payer: Self-pay

## 2017-08-24 ENCOUNTER — Emergency Department (HOSPITAL_COMMUNITY)
Admission: EM | Admit: 2017-08-24 | Discharge: 2017-08-25 | Disposition: A | Discharge status: Home / Self Care | Payer: Medicare Other | Attending: Emergency Medicine | Admitting: Emergency Medicine

## 2017-08-24 ENCOUNTER — Ambulatory Visit: Payer: Medicare Other | Admitting: Nurse Practitioner

## 2017-08-24 DIAGNOSIS — E1122 Type 2 diabetes mellitus with diabetic chronic kidney disease: Secondary | ICD-10-CM | POA: Diagnosis not present

## 2017-08-24 DIAGNOSIS — X58XXXD Exposure to other specified factors, subsequent encounter: Secondary | ICD-10-CM | POA: Insufficient documentation

## 2017-08-24 DIAGNOSIS — Z7984 Long term (current) use of oral hypoglycemic drugs: Secondary | ICD-10-CM | POA: Diagnosis not present

## 2017-08-24 DIAGNOSIS — I509 Heart failure, unspecified: Secondary | ICD-10-CM | POA: Insufficient documentation

## 2017-08-24 DIAGNOSIS — E1142 Type 2 diabetes mellitus with diabetic polyneuropathy: Secondary | ICD-10-CM | POA: Diagnosis not present

## 2017-08-24 DIAGNOSIS — S91312D Laceration without foreign body, left foot, subsequent encounter: Secondary | ICD-10-CM | POA: Diagnosis not present

## 2017-08-24 DIAGNOSIS — Z5189 Encounter for other specified aftercare: Secondary | ICD-10-CM | POA: Diagnosis not present

## 2017-08-24 DIAGNOSIS — I130 Hypertensive heart and chronic kidney disease with heart failure and stage 1 through stage 4 chronic kidney disease, or unspecified chronic kidney disease: Secondary | ICD-10-CM | POA: Insufficient documentation

## 2017-08-24 DIAGNOSIS — S91302A Unspecified open wound, left foot, initial encounter: Secondary | ICD-10-CM | POA: Diagnosis not present

## 2017-08-24 DIAGNOSIS — Z4800 Encounter for change or removal of nonsurgical wound dressing: Secondary | ICD-10-CM | POA: Diagnosis not present

## 2017-08-24 DIAGNOSIS — S91302D Unspecified open wound, left foot, subsequent encounter: Secondary | ICD-10-CM | POA: Diagnosis not present

## 2017-08-24 DIAGNOSIS — Z79899 Other long term (current) drug therapy: Secondary | ICD-10-CM | POA: Diagnosis not present

## 2017-08-24 DIAGNOSIS — N183 Chronic kidney disease, stage 3 (moderate): Secondary | ICD-10-CM | POA: Diagnosis not present

## 2017-08-24 DIAGNOSIS — Z7901 Long term (current) use of anticoagulants: Secondary | ICD-10-CM | POA: Insufficient documentation

## 2017-08-24 DIAGNOSIS — R5381 Other malaise: Secondary | ICD-10-CM | POA: Diagnosis not present

## 2017-08-24 DIAGNOSIS — S99929A Unspecified injury of unspecified foot, initial encounter: Secondary | ICD-10-CM | POA: Diagnosis not present

## 2017-08-24 DIAGNOSIS — Z87891 Personal history of nicotine dependence: Secondary | ICD-10-CM | POA: Diagnosis not present

## 2017-08-24 DIAGNOSIS — R58 Hemorrhage, not elsewhere classified: Secondary | ICD-10-CM | POA: Diagnosis not present

## 2017-08-24 DIAGNOSIS — L89892 Pressure ulcer of other site, stage 2: Secondary | ICD-10-CM | POA: Diagnosis not present

## 2017-08-24 DIAGNOSIS — I482 Chronic atrial fibrillation: Secondary | ICD-10-CM | POA: Diagnosis not present

## 2017-08-24 DIAGNOSIS — I83892 Varicose veins of left lower extremities with other complications: Secondary | ICD-10-CM | POA: Diagnosis not present

## 2017-08-24 NOTE — ED Triage Notes (Signed)
Pt arrived form home with 2 cm skin tear on left heel that continues to bleed. Pt not complaining of any pain at site. Pt reports he is on blood thinners. EMS states they have wrapped it en route and has not bled since.

## 2017-08-24 NOTE — Discharge Instructions (Signed)
We recommend constant 5-10 minute direct pressure to any area that rebleeds. Try to keep the foot elevated as much as possible. Discuss whether or not you should continue your blood thinner with your cardiologist. You may return for new or concerning symptoms.

## 2017-08-24 NOTE — ED Provider Notes (Signed)
[facility] DEPT Provider Note   CSN: [id number] Arrival date & time: 08/24/17  2103     History   Chief Complaint Chief Complaint  Patient presents with  . Abrasion    HPI Logan Bean is a 84 y.o. male.  84 year old male with a history of hypertension, dyslipidemia, diabetes, varicose veins presents to the emergency department for persistent bleeding to his left heel.  He is on chronic Eliquis and has had difficulty with hemostasis prior to arrival.  Patient believes that a follow-up appointment at the wound care clinic provoked is intermittent, worsening bleeding from his wound site.  He had previously gone 2 weeks without repeat bleed.  Family has tried applying ABD pads as well as pressure.  This has provided intermittent improvement.  No fevers, numbness, paresthesias.     The history is provided by the patient and a relative. No language interpreter was used.    Past Medical History:  Diagnosis Date  . Arthritis    left knee  . Diabetes mellitus    diet controlled, pt does not check cbg  . Diaphragmatic hernia without mention of obstruction or gangrene   . Dizziness and giddiness   . Edema   . H/O hiatal hernia   . Hypercholesteremia   . Hypertension   . Left rotator cuff tear Jul 12, 2012  . Stomach ulcer    years  . Unspecified hereditary and idiopathic peripheral neuropathy   . Unspecified vitamin D deficiency   . Varicose veins    both legs    Patient Active Problem List   Diagnosis Date Noted  . Iron deficiency anemia secondary to inadequate dietary iron intake 08/19/2016  . Other constipation 08/19/2016  . Capillary vascular disease 11/21/2015  . Venous ulcer of ankle ([place]) 11/02/2015  . Near syncope 04/27/2015  . Type 2 diabetes mellitus with hyperglycemia ([place]) 04/27/2015  . Contusion of multiple sites 04/26/2015  . Type 2 diabetes mellitus with diabetic chronic kidney disease ([place]) 07/11/2014  . Colon cancer  screening 07/11/2014  . Hygroma 05/19/2014  . Atrial fibrillation with RVR ([place]) 05/18/2014  . Laceration of skin of face 05/18/2014  . Fall   . Venous stasis dermatitis 05/16/2014  . Carpal tunnel syndrome 05/10/2014  . CKD (chronic kidney disease) stage 3, GFR 30-59 ml/min (HCC) 10/11/2013  . Restless leg syndrome 05/25/2013  . Hyperthyroidism, subclinical 03/02/2013  . Chronic anticoagulation-Eliquis 01/31/2013  . Chronic atrial fibrillation ([place]) 01/21/2013  . Sweating increase 01/19/2013  . CHF (congestive heart failure) ([place]) 01/19/2013  . Pleural effusion 01/19/2013  . Multinodular goiter 01/19/2013  . Dyspnea 01/15/2013  . Edema 01/15/2013  . Hypokalemia 01/15/2013  . Rash and nonspecific skin eruption 01/15/2013  . Dyslipidemia 12/29/2012  . GERD (gastroesophageal reflux disease) 12/29/2012  . Loss of weight 12/29/2012  . Closed bimalleolar fracture of right ankle 09/28/2012  . Diarrhea 09/22/2012  . Essential hypertension 09/21/2012  . Complete tear of left rotator cuff 11/05/2011    Past Surgical History:  Procedure Laterality Date  . CARDIOVERSION N/A 02/22/2013   Procedure: CARDIOVERSION;  Surgeon: Darlin Coco, MD;  Location: [facility] ENDOSCOPY;  Service: Cardiovascular;  Laterality: N/A;  . Esopagus strectching  2003 and 3 yrs ago  . MASTECTOMY     bilateral  . ORIF ANKLE FRACTURE Right 09/20/2012   Procedure: OPEN REDUCTION INTERNAL FIXATION (ORIF) ANKLE FRACTURE;  Surgeon: Johnn Hai, MD;  Location: [facility] ORS;  Service: Orthopedics;  Laterality: Right;  . REVERSE SHOULDER ARTHROPLASTY  Right 09/17/2012   Procedure: REVERSE SHOULDER ARTHROPLASTY;  Surgeon: Augustin Schooling, MD;  Location: [facility];  Service: Orthopedics;  Laterality: Right;  . SHOULDER OPEN ROTATOR CUFF REPAIR  11/05/2011   Procedure: ROTATOR CUFF REPAIR SHOULDER OPEN;  Surgeon: Tobi Bastos, MD;  Location: [facility] ORS;  Service: Orthopedics;  Laterality: Left;  Left Shoulder Rotator Cuff Repair Open  with Graft and Anchors  . TONSILLECTOMY  age 58    OB History    No data available       Home Medications    Prior to Admission medications   Medication Sig Start Date End Date Taking? Authorizing Provider  acetaminophen (TYLENOL) 325 MG tablet Take 650 mg by mouth every 6 (six) hours as needed.   Yes [provider]  Calcium Carbonate-Vitamin D 600-200 MG-UNIT TABS Take 1 tablet by mouth 2 (two) times daily. Take 1 tablet daily for vitamin supplement.   Yes [provider]  ELIQUIS 2.5 MG TABS tablet TAKE 1 TABLET BY MOUTH TWICE DAILY. 06/11/17  Yes Lauree Chandler, NP  ferrous sulfate 325 (65 FE) MG tablet Take 325 mg by mouth 2 (two) times a week. Takes one tablet by mouth every three days    Yes [provider]  furosemide (LASIX) 20 MG tablet TAKE (1/2) TABLET ONCE A DAY FOR EDEMA, HYPERTENSION. 12/23/16  Yes Lauree Chandler, NP  Glucosamine-Chondroit-Vit C-Mn (GLUCOSAMINE 1500 COMPLEX) CAPS Take one tablet once a day 01/12/13  Yes Bubba Camp, [place], MD  lisinopril (PRINIVIL,ZESTRIL) 10 MG tablet TAKE 1 TABLET BY MOUTH DAILY. 06/11/17  Yes Lauree Chandler, NP  metFORMIN (GLUCOPHAGE) 500 MG tablet TAKE 1 TABLET BY MOUTH TWICE DAILY WITH A MEAL. 06/11/17  Yes Lauree Chandler, NP  metoprolol tartrate (LOPRESSOR) 25 MG tablet TAKE 1 TABLET BY MOUTH TWICE DAILY. 06/11/17  Yes Lauree Chandler, NP  Multiple Vitamin (MULITIVITAMIN WITH MINERALS) TABS Take 1 tablet by mouth daily.    Yes [provider]  Omega-3 Fatty Acids (FISH OIL) 1000 MG CAPS Take 1,000 mg by mouth daily.    Yes [provider]  polyvinyl alcohol (LIQUIFILM TEARS) 1.4 % ophthalmic solution Place 1 drop into both eyes as needed for dry eyes.   Yes [provider]  simvastatin (ZOCOR) 10 MG tablet TAKE ONE TABLET AT BEDTIME. 06/11/17  Yes Lauree Chandler, NP  glucose blood (TRUETEST TEST) test strip E11.22 check blood sugar once daily 06/13/15   Lauree Chandler, NP  TRUEPLUS LANCETS 30G MISC Use to check blood sugar once daily. Dx E11.9 06/13/15   Lauree Chandler, NP    Family History Family History  Problem Relation Age of Onset  . Parkinson's disease Mother   . Hip fracture Mother   . Heart disease Mother   . Alzheimer's disease Mother   . Heart disease Father   . Parkinson's disease Sister   . Heart disease Brother     Social History Social History   Tobacco Use  . Smoking status: Former Smoker    Years: 50.00    Types: Pipe, Cigarettes    Last attempt to quit: 07/14/1996    Years since quitting: 21.1  . Smokeless tobacco: Never Used  Substance Use Topics  . Alcohol use: Yes    Comment: occasional beer  . Drug use: No     Allergies   Morphine and related and Oxycodone   Review of Systems Review of Systems Ten systems reviewed and are negative for  acute change, except as noted in the HPI.    Physical Exam Updated Vital Signs BP 94/60 (BP Location: Right Arm)   Pulse 81   Temp (!) 97.2 F (36.2 C) (Oral)   Resp 20   Ht 5\' 6"  (1.676 m)   Wt 59 kg (130 lb)   SpO2 99%   BMI 20.98 kg/m   Physical Exam  Constitutional: He is oriented to person, place, and time. He appears well-developed and well-nourished. No distress.  Nontoxic appearing and in NAD  HENT:  Head: Normocephalic and atraumatic.  Eyes: Conjunctivae and EOM are normal. No scleral icterus.  Neck: Normal range of motion.  Cardiovascular: Intact distal pulses.  Pulmonary/Chest: Effort normal. No stridor. No respiratory distress.  Respirations even and unlabored  Musculoskeletal: Normal range of motion.  Neurological: He is alert and oriented to person, place, and time. He exhibits normal muscle tone. Coordination normal.  Sensation intact in BLE  Skin: Skin is warm and dry. No rash noted. He is not diaphoretic. No erythema. No pallor.  Superficial wounds and scaling skin to the L foot, localized most to the heel. No active bleeding. No  palpable, pulsatile bleeding.  Psychiatric: He has a normal mood and affect. His behavior is normal.  Nursing note and vitals reviewed.    ED Treatments / Results  Labs (all labs ordered are listed, but only abnormal results are displayed) Labs Reviewed - No data to display  EKG  EKG Interpretation None       Radiology No results found.  Procedures Procedures (including critical care time)  Medications Ordered in ED Medications - No data to display   Initial Impression / Assessment and Plan / ED Course  I have reviewed the triage vital signs and the nursing notes.  Pertinent labs & imaging results that were available during my care of the patient were reviewed by me and considered in my medical decision making (see chart for details).     84 year old male presents for wound care assessment.  No active bleeding today.  He is neurovascularly intact.  No indication for further emergent workup, especially since hemostasis has been achieved spontaneously.  Surgicel pressure dressing applied to the problematic area to try and prevent repeat bleeding.  Have encouraged primary care follow-up for wound recheck.  We have also advised that patient discuss possible discontinuation of his Eliquis with his cardiologist.  Return precautions discussed and provided. Patient discharged in stable condition.   Final Clinical Impressions(s) / ED Diagnoses   Final diagnoses:  Encounter for wound care    ED Discharge Orders    None       Antonietta Breach, Hershal Coria 08/24/17 2342    Quintella Reichert, MD 08/27/17 [phone]

## 2017-08-25 DIAGNOSIS — M170 Bilateral primary osteoarthritis of knee: Secondary | ICD-10-CM | POA: Diagnosis not present

## 2017-08-26 DIAGNOSIS — I482 Chronic atrial fibrillation: Secondary | ICD-10-CM | POA: Diagnosis not present

## 2017-08-26 DIAGNOSIS — L97222 Non-pressure chronic ulcer of left calf with fat layer exposed: Secondary | ICD-10-CM | POA: Diagnosis not present

## 2017-08-26 DIAGNOSIS — L97529 Non-pressure chronic ulcer of other part of left foot with unspecified severity: Secondary | ICD-10-CM | POA: Diagnosis not present

## 2017-08-26 DIAGNOSIS — R609 Edema, unspecified: Secondary | ICD-10-CM | POA: Diagnosis not present

## 2017-08-26 DIAGNOSIS — L97322 Non-pressure chronic ulcer of left ankle with fat layer exposed: Secondary | ICD-10-CM | POA: Diagnosis not present

## 2017-08-26 DIAGNOSIS — S91312D Laceration without foreign body, left foot, subsequent encounter: Secondary | ICD-10-CM | POA: Diagnosis not present

## 2017-08-26 DIAGNOSIS — E1142 Type 2 diabetes mellitus with diabetic polyneuropathy: Secondary | ICD-10-CM | POA: Diagnosis not present

## 2017-08-26 DIAGNOSIS — L89892 Pressure ulcer of other site, stage 2: Secondary | ICD-10-CM | POA: Diagnosis not present

## 2017-08-26 DIAGNOSIS — E11622 Type 2 diabetes mellitus with other skin ulcer: Secondary | ICD-10-CM | POA: Diagnosis not present

## 2017-08-26 DIAGNOSIS — L97422 Non-pressure chronic ulcer of left heel and midfoot with fat layer exposed: Secondary | ICD-10-CM | POA: Diagnosis not present

## 2017-08-26 DIAGNOSIS — R5381 Other malaise: Secondary | ICD-10-CM | POA: Diagnosis not present

## 2017-08-26 DIAGNOSIS — I83892 Varicose veins of left lower extremities with other complications: Secondary | ICD-10-CM | POA: Diagnosis not present

## 2017-08-29 DIAGNOSIS — L89892 Pressure ulcer of other site, stage 2: Secondary | ICD-10-CM | POA: Diagnosis not present

## 2017-08-29 DIAGNOSIS — I83892 Varicose veins of left lower extremities with other complications: Secondary | ICD-10-CM | POA: Diagnosis not present

## 2017-08-29 DIAGNOSIS — R5381 Other malaise: Secondary | ICD-10-CM | POA: Diagnosis not present

## 2017-08-29 DIAGNOSIS — E1142 Type 2 diabetes mellitus with diabetic polyneuropathy: Secondary | ICD-10-CM | POA: Diagnosis not present

## 2017-08-29 DIAGNOSIS — S91312D Laceration without foreign body, left foot, subsequent encounter: Secondary | ICD-10-CM | POA: Diagnosis not present

## 2017-08-29 DIAGNOSIS — I482 Chronic atrial fibrillation: Secondary | ICD-10-CM | POA: Diagnosis not present

## 2017-08-30 DIAGNOSIS — E1142 Type 2 diabetes mellitus with diabetic polyneuropathy: Secondary | ICD-10-CM | POA: Diagnosis not present

## 2017-08-30 DIAGNOSIS — L89892 Pressure ulcer of other site, stage 2: Secondary | ICD-10-CM | POA: Diagnosis not present

## 2017-08-30 DIAGNOSIS — I482 Chronic atrial fibrillation: Secondary | ICD-10-CM | POA: Diagnosis not present

## 2017-08-30 DIAGNOSIS — R5381 Other malaise: Secondary | ICD-10-CM | POA: Diagnosis not present

## 2017-08-30 DIAGNOSIS — I83892 Varicose veins of left lower extremities with other complications: Secondary | ICD-10-CM | POA: Diagnosis not present

## 2017-08-30 DIAGNOSIS — S91312D Laceration without foreign body, left foot, subsequent encounter: Secondary | ICD-10-CM | POA: Diagnosis not present

## 2017-08-31 DIAGNOSIS — R5381 Other malaise: Secondary | ICD-10-CM | POA: Diagnosis not present

## 2017-08-31 DIAGNOSIS — S91312D Laceration without foreign body, left foot, subsequent encounter: Secondary | ICD-10-CM | POA: Diagnosis not present

## 2017-08-31 DIAGNOSIS — I83892 Varicose veins of left lower extremities with other complications: Secondary | ICD-10-CM | POA: Diagnosis not present

## 2017-08-31 DIAGNOSIS — I482 Chronic atrial fibrillation: Secondary | ICD-10-CM | POA: Diagnosis not present

## 2017-08-31 DIAGNOSIS — L89892 Pressure ulcer of other site, stage 2: Secondary | ICD-10-CM | POA: Diagnosis not present

## 2017-08-31 DIAGNOSIS — E1142 Type 2 diabetes mellitus with diabetic polyneuropathy: Secondary | ICD-10-CM | POA: Diagnosis not present

## 2017-09-01 DIAGNOSIS — I83892 Varicose veins of left lower extremities with other complications: Secondary | ICD-10-CM | POA: Diagnosis not present

## 2017-09-01 DIAGNOSIS — R5381 Other malaise: Secondary | ICD-10-CM | POA: Diagnosis not present

## 2017-09-01 DIAGNOSIS — S91312D Laceration without foreign body, left foot, subsequent encounter: Secondary | ICD-10-CM | POA: Diagnosis not present

## 2017-09-01 DIAGNOSIS — L89892 Pressure ulcer of other site, stage 2: Secondary | ICD-10-CM | POA: Diagnosis not present

## 2017-09-01 DIAGNOSIS — E1142 Type 2 diabetes mellitus with diabetic polyneuropathy: Secondary | ICD-10-CM | POA: Diagnosis not present

## 2017-09-01 DIAGNOSIS — I482 Chronic atrial fibrillation: Secondary | ICD-10-CM | POA: Diagnosis not present

## 2017-09-02 DIAGNOSIS — I482 Chronic atrial fibrillation: Secondary | ICD-10-CM | POA: Diagnosis not present

## 2017-09-02 DIAGNOSIS — E1142 Type 2 diabetes mellitus with diabetic polyneuropathy: Secondary | ICD-10-CM | POA: Diagnosis not present

## 2017-09-02 DIAGNOSIS — L89892 Pressure ulcer of other site, stage 2: Secondary | ICD-10-CM | POA: Diagnosis not present

## 2017-09-02 DIAGNOSIS — I83892 Varicose veins of left lower extremities with other complications: Secondary | ICD-10-CM | POA: Diagnosis not present

## 2017-09-02 DIAGNOSIS — S91312D Laceration without foreign body, left foot, subsequent encounter: Secondary | ICD-10-CM | POA: Diagnosis not present

## 2017-09-02 DIAGNOSIS — R5381 Other malaise: Secondary | ICD-10-CM | POA: Diagnosis not present

## 2017-09-03 DIAGNOSIS — E1142 Type 2 diabetes mellitus with diabetic polyneuropathy: Secondary | ICD-10-CM | POA: Diagnosis not present

## 2017-09-03 DIAGNOSIS — I482 Chronic atrial fibrillation: Secondary | ICD-10-CM | POA: Diagnosis not present

## 2017-09-03 DIAGNOSIS — R5381 Other malaise: Secondary | ICD-10-CM | POA: Diagnosis not present

## 2017-09-03 DIAGNOSIS — S91312D Laceration without foreign body, left foot, subsequent encounter: Secondary | ICD-10-CM | POA: Diagnosis not present

## 2017-09-03 DIAGNOSIS — L89892 Pressure ulcer of other site, stage 2: Secondary | ICD-10-CM | POA: Diagnosis not present

## 2017-09-03 DIAGNOSIS — I83892 Varicose veins of left lower extremities with other complications: Secondary | ICD-10-CM | POA: Diagnosis not present

## 2017-09-04 ENCOUNTER — Telehealth: Payer: Self-pay | Admitting: *Deleted

## 2017-09-04 DIAGNOSIS — R609 Edema, unspecified: Secondary | ICD-10-CM | POA: Diagnosis not present

## 2017-09-04 DIAGNOSIS — L97529 Non-pressure chronic ulcer of other part of left foot with unspecified severity: Secondary | ICD-10-CM | POA: Diagnosis not present

## 2017-09-04 DIAGNOSIS — L97422 Non-pressure chronic ulcer of left heel and midfoot with fat layer exposed: Secondary | ICD-10-CM | POA: Diagnosis not present

## 2017-09-04 DIAGNOSIS — E11621 Type 2 diabetes mellitus with foot ulcer: Secondary | ICD-10-CM | POA: Diagnosis not present

## 2017-09-04 DIAGNOSIS — E11622 Type 2 diabetes mellitus with other skin ulcer: Secondary | ICD-10-CM | POA: Diagnosis not present

## 2017-09-04 DIAGNOSIS — L97222 Non-pressure chronic ulcer of left calf with fat layer exposed: Secondary | ICD-10-CM | POA: Diagnosis not present

## 2017-09-04 DIAGNOSIS — L97322 Non-pressure chronic ulcer of left ankle with fat layer exposed: Secondary | ICD-10-CM | POA: Diagnosis not present

## 2017-09-04 NOTE — Telephone Encounter (Signed)
POA Jeani Hawking calling asking if patient should still remain on Eliquis? Per Jeani Hawking pt is having a hard time healing, pt currently going to the wound care center and they are also questioning if pt should remain on Eliquis. Please advise ( send back to [facility])

## 2017-09-04 NOTE — Telephone Encounter (Signed)
I spoke with Logan Bean and she scheduled an appointment to discuss stopping the medication.

## 2017-09-04 NOTE — Telephone Encounter (Signed)
We talked about risk vs benefits of eliquis during OV, risk of coming off eliquis would be at increase risk stroke/heart attack due to a fib. If she would like to try him off to see if this helps we certainly could however it would not be without risk. If she would like to bring him in and discuss this further I can see him in office. Thank you.

## 2017-09-07 DIAGNOSIS — L89892 Pressure ulcer of other site, stage 2: Secondary | ICD-10-CM | POA: Diagnosis not present

## 2017-09-07 DIAGNOSIS — S91312D Laceration without foreign body, left foot, subsequent encounter: Secondary | ICD-10-CM | POA: Diagnosis not present

## 2017-09-07 DIAGNOSIS — I83892 Varicose veins of left lower extremities with other complications: Secondary | ICD-10-CM | POA: Diagnosis not present

## 2017-09-07 DIAGNOSIS — I482 Chronic atrial fibrillation: Secondary | ICD-10-CM | POA: Diagnosis not present

## 2017-09-07 DIAGNOSIS — R5381 Other malaise: Secondary | ICD-10-CM | POA: Diagnosis not present

## 2017-09-07 DIAGNOSIS — E1142 Type 2 diabetes mellitus with diabetic polyneuropathy: Secondary | ICD-10-CM | POA: Diagnosis not present

## 2017-09-08 ENCOUNTER — Ambulatory Visit (INDEPENDENT_AMBULATORY_CARE_PROVIDER_SITE_OTHER): Payer: Medicare Other | Admitting: Nurse Practitioner

## 2017-09-08 ENCOUNTER — Encounter: Payer: Self-pay | Admitting: Nurse Practitioner

## 2017-09-08 VITALS — BP 100/60 | HR 87 | Temp 97.5°F | Ht 66.0 in | Wt 126.2 lb

## 2017-09-08 DIAGNOSIS — S81802D Unspecified open wound, left lower leg, subsequent encounter: Secondary | ICD-10-CM | POA: Diagnosis not present

## 2017-09-08 DIAGNOSIS — I739 Peripheral vascular disease, unspecified: Secondary | ICD-10-CM

## 2017-09-08 DIAGNOSIS — I482 Chronic atrial fibrillation, unspecified: Secondary | ICD-10-CM

## 2017-09-08 DIAGNOSIS — I83899 Varicose veins of unspecified lower extremities with other complications: Secondary | ICD-10-CM

## 2017-09-08 NOTE — Patient Instructions (Addendum)
Risk vs benefits of Eliquis discussed  Bleeding/wound healing vs clotting due to a fib.   To keep follow up with wound care center and vascular clinic  To monitor hoariness

## 2017-09-08 NOTE — Progress Notes (Signed)
Careteam: Patient Care Team: Lauree Chandler, NP as PCP - General (Nurse Practitioner)  Advanced Directive information    Allergies  Allergen Reactions  . Morphine And Related Nausea And Vomiting  . Oxycodone Nausea And Vomiting    Chief Complaint  Patient presents with  . Acute Visit    Discuss concerns about      HPI: Patient is a 84 y.o. male seen in the office today due to medication concerns.  pts POA questioned if pt should remained on eliquis due to having a hard time healing wound on lower leg. Apparently this was brought up by someone at wound care center but Pts POA husband scott who is here today states this was not brought up at last visit.  Reports he has an appt with vascular next week.  Original wound to back of left lower leg has healed but he has developed a new wound that has gotten much worse in the matter of a week and they had seen the wound clinic shortly after it appeared.   Pt reports increase in shortness of breath but states he never has a hard time breathing and does not think he is breathing more frequently or with increase RR. Reports it does not happen all the time but occurs spontaneously and not necessarily with activity. Does not pay a lot of attention to it when it occurs but realizes it when he is talking. Reports it is more that he has lost his voice or his voice is weak.  No GERD. No URI or cold symptoms  Had another episode of bleeding from varicose vein after wound care clinic visit  Review of Systems:  Review of Systems  Constitutional: Negative for chills, fever and weight loss.       Increases weakness due to decrease mobility due to bleeding vein   Respiratory: Negative for cough, sputum production, shortness of breath and wheezing.   Cardiovascular: Positive for leg swelling (right > left). Negative for chest pain.  Musculoskeletal: Negative for myalgias.  Skin:       Scabbed area from previous bleeding heel due to varicose  vein.  Skin breakdown to posterior aspect left lower leg  Neurological: Positive for weakness. Negative for dizziness and headaches.  Endo/Heme/Allergies: Bruises/bleeds easily.    Past Medical History:  Diagnosis Date  . Arthritis    left knee  . Diabetes mellitus    diet controlled, pt does not check cbg  . Diaphragmatic hernia without mention of obstruction or gangrene   . Dizziness and giddiness   . Edema   . H/O hiatal hernia   . Hypercholesteremia   . Hypertension   . Left rotator cuff tear Jul 12, 2012  . Stomach ulcer    years  . Unspecified hereditary and idiopathic peripheral neuropathy   . Unspecified vitamin D deficiency   . Varicose veins    both legs   Past Surgical History:  Procedure Laterality Date  . CARDIOVERSION N/A 02/22/2013   Procedure: CARDIOVERSION;  Surgeon: Darlin Coco, MD;  Location: [facility] ENDOSCOPY;  Service: Cardiovascular;  Laterality: N/A;  . Esopagus strectching  2003 and 3 yrs ago  . MASTECTOMY     bilateral  . ORIF ANKLE FRACTURE Right 09/20/2012   Procedure: OPEN REDUCTION INTERNAL FIXATION (ORIF) ANKLE FRACTURE;  Surgeon: Johnn Hai, MD;  Location: [facility] ORS;  Service: Orthopedics;  Laterality: Right;  . REVERSE SHOULDER ARTHROPLASTY Right 09/17/2012   Procedure: REVERSE SHOULDER ARTHROPLASTY;  Surgeon: Doran Heater  Veverly Fells, MD;  Location: [place];  Service: Orthopedics;  Laterality: Right;  . SHOULDER OPEN ROTATOR CUFF REPAIR  11/05/2011   Procedure: ROTATOR CUFF REPAIR SHOULDER OPEN;  Surgeon: Tobi Bastos, MD;  Location: [facility] ORS;  Service: Orthopedics;  Laterality: Left;  Left Shoulder Rotator Cuff Repair Open with Graft and Anchors  . TONSILLECTOMY  age 33   Social History:   reports that he quit smoking about 21 years ago. His smoking use included pipe and cigarettes. He quit after 50.00 years of use. he has never used smokeless tobacco. He reports that he drinks alcohol. He reports that he does not use drugs.  Family History  Problem  Relation Age of Onset  . Parkinson's disease Mother   . Hip fracture Mother   . Heart disease Mother   . Alzheimer's disease Mother   . Heart disease Father   . Parkinson's disease Sister   . Heart disease Brother     Medications: Patient's Medications  New Prescriptions   No medications on file  Previous Medications   ACETAMINOPHEN (TYLENOL) 325 MG TABLET    Take 650 mg by mouth every 6 (six) hours as needed.   CALCIUM CARBONATE-VITAMIN D 600-200 MG-UNIT TABS    Take 1 tablet by mouth 2 (two) times daily. Take 1 tablet daily for vitamin supplement.   ELIQUIS 2.5 MG TABS TABLET    TAKE 1 TABLET BY MOUTH TWICE DAILY.   FERROUS SULFATE 325 (65 FE) MG TABLET    Take 325 mg by mouth 2 (two) times a week. Takes one tablet by mouth every three days    FUROSEMIDE (LASIX) 20 MG TABLET    TAKE (1/2) TABLET ONCE A DAY FOR EDEMA, HYPERTENSION.   GLUCOSAMINE-CHONDROIT-VIT C-MN (GLUCOSAMINE 1500 COMPLEX) CAPS    Take one tablet once a day   GLUCOSE BLOOD (TRUETEST TEST) TEST STRIP    E11.22 check blood sugar once daily   LISINOPRIL (PRINIVIL,ZESTRIL) 10 MG TABLET    TAKE 1 TABLET BY MOUTH DAILY.   METFORMIN (GLUCOPHAGE) 500 MG TABLET    TAKE 1 TABLET BY MOUTH TWICE DAILY WITH A MEAL.   METOPROLOL TARTRATE (LOPRESSOR) 25 MG TABLET    TAKE 1 TABLET BY MOUTH TWICE DAILY.   MULTIPLE VITAMIN (MULITIVITAMIN WITH MINERALS) TABS    Take 1 tablet by mouth daily.    OMEGA-3 FATTY ACIDS (FISH OIL) 1000 MG CAPS    Take 1,000 mg by mouth daily.    POLYVINYL ALCOHOL (LIQUIFILM TEARS) 1.4 % OPHTHALMIC SOLUTION    Place 1 drop into both eyes as needed for dry eyes.   SIMVASTATIN (ZOCOR) 10 MG TABLET    TAKE ONE TABLET AT BEDTIME.   TRUEPLUS LANCETS 30G MISC    Use to check blood sugar once daily. Dx E11.9  Modified Medications   No medications on file  Discontinued Medications   No medications on file     Physical Exam:  Vitals:   09/08/17 1336  BP: 100/60  Pulse: 87  Temp: (!) 97.5 F (36.4 C)    TempSrc: Oral  SpO2: 97%  Weight: 126 lb 3.2 oz (57.2 kg)  Height: 5\' 6"  (1.676 m)   Body mass index is 20.37 kg/m.  Physical Exam  Constitutional: He is oriented to person, place, and time. He appears well-developed and well-nourished.  Frail, pleasant, A&O  HENT:  Head: Normocephalic and atraumatic.  Eyes: Conjunctivae and EOM are normal. Pupils are equal, round, and reactive to light.  Cardiovascular: Normal rate,  normal heart sounds and intact distal pulses.  Afib, irregular  Pulmonary/Chest: Effort normal and breath sounds normal. No respiratory distress.  Abdominal: Soft. Bowel sounds are normal. He exhibits no distension. There is no tenderness.  Musculoskeletal: He exhibits edema and tenderness.  Tenderness noted to bilateral LE, worse on left. thin fragile skin. Brownish skin discoloration anteriorly with slight redness.  Neurological: He is alert and oriented to person, place, and time.  Skin: Skin is warm and dry. No erythema.  Psychiatric: He has a normal mood and affect.    Labs reviewed: Basic Metabolic Panel: Recent Labs    11/18/16 1003 02/19/17 1033 08/05/17 1626  NA 141 141 136  K 4.9 4.3 5.3  CL 104 104 102  CO2 28 25 24   GLUCOSE 184* 197* 292*  BUN 38* 35* 56*  CREATININE 1.07 0.94 0.85  CALCIUM 9.5 9.4 10.2  TSH 0.40  --   --    Liver Function Tests: Recent Labs    11/18/16 1003 08/05/17 1626  AST 14 14  ALT 17 18  ALKPHOS 69  --   BILITOT 1.1 0.6  PROT 5.9* 6.1  ALBUMIN 3.7  --    No results for input(s): LIPASE, AMYLASE in the last 8760 hours. No results for input(s): AMMONIA in the last 8760 hours. CBC: Recent Labs    11/18/16 1003 01/30/17 1042 07/21/17 0926 07/22/17 2032  WBC 10.6 7.4 11.2* 7.8  NEUTROABS 8,692* 6,512  --   --   HGB 14.4 13.4 14.4 13.7  HCT 45.6 42.1 45.1 42.1  MCV 88.2 91.5 87.7 89.4  PLT 235 246 172 190   Lipid Panel: Recent Labs    11/18/16 1003  CHOL 184  HDL 53  LDLCALC 107*  TRIG 119   CHOLHDL 3.5   TSH: Recent Labs    11/18/16 1003  TSH 0.40   A1C: Lab Results  Component Value Date   HGBA1C 7.4 (H) 08/05/2017     Assessment/Plan 1. Wound of left leg, subsequent encounter Original wound has healed but now with another area that wound care is monitoring closely. Area is being managed by wound care and home health, per scott and pt area is unchanged and currently with dressing.   2. PAD (peripheral artery disease) ([place]) -appt with vascular due to abnormal ABI this week. Also plans on discussing eliquis with vascular at that time.   3. Bleeding from varicose vein Stable at this time had another episode of bleeding from varicose vein after last wound care visit. Had to go to ED for 3rd time and surgical pressure applied with good effects.   4. Chronic atrial fibrillation (HCC) Rate controlled continues on eliquis- discussed risk vs benefit of coming off eliquis, also noted to have ongoing bleeding of varicose vein and if this continuous may need to consider alternative. Plans to discuss with vascular and cardiology as well.    Next appt: 12/08/2017 Carlos American. Harle Battiest  [facility] [phone] 8 am - 5 pm) [phone] (after hours)

## 2017-09-09 DIAGNOSIS — S91312D Laceration without foreign body, left foot, subsequent encounter: Secondary | ICD-10-CM | POA: Diagnosis not present

## 2017-09-09 DIAGNOSIS — E1142 Type 2 diabetes mellitus with diabetic polyneuropathy: Secondary | ICD-10-CM | POA: Diagnosis not present

## 2017-09-09 DIAGNOSIS — I83892 Varicose veins of left lower extremities with other complications: Secondary | ICD-10-CM | POA: Diagnosis not present

## 2017-09-09 DIAGNOSIS — I482 Chronic atrial fibrillation: Secondary | ICD-10-CM | POA: Diagnosis not present

## 2017-09-09 DIAGNOSIS — R5381 Other malaise: Secondary | ICD-10-CM | POA: Diagnosis not present

## 2017-09-09 DIAGNOSIS — L89892 Pressure ulcer of other site, stage 2: Secondary | ICD-10-CM | POA: Diagnosis not present

## 2017-09-10 DIAGNOSIS — R609 Edema, unspecified: Secondary | ICD-10-CM | POA: Diagnosis not present

## 2017-09-10 DIAGNOSIS — L97529 Non-pressure chronic ulcer of other part of left foot with unspecified severity: Secondary | ICD-10-CM | POA: Diagnosis not present

## 2017-09-10 DIAGNOSIS — L97422 Non-pressure chronic ulcer of left heel and midfoot with fat layer exposed: Secondary | ICD-10-CM | POA: Diagnosis not present

## 2017-09-10 DIAGNOSIS — E11622 Type 2 diabetes mellitus with other skin ulcer: Secondary | ICD-10-CM | POA: Diagnosis not present

## 2017-09-10 DIAGNOSIS — L97322 Non-pressure chronic ulcer of left ankle with fat layer exposed: Secondary | ICD-10-CM | POA: Diagnosis not present

## 2017-09-10 DIAGNOSIS — L97522 Non-pressure chronic ulcer of other part of left foot with fat layer exposed: Secondary | ICD-10-CM | POA: Diagnosis not present

## 2017-09-10 DIAGNOSIS — L97222 Non-pressure chronic ulcer of left calf with fat layer exposed: Secondary | ICD-10-CM | POA: Diagnosis not present

## 2017-09-11 ENCOUNTER — Other Ambulatory Visit: Payer: Self-pay

## 2017-09-11 ENCOUNTER — Ambulatory Visit (INDEPENDENT_AMBULATORY_CARE_PROVIDER_SITE_OTHER): Payer: Medicare Other | Admitting: Vascular Surgery

## 2017-09-11 ENCOUNTER — Encounter: Payer: Self-pay | Admitting: Vascular Surgery

## 2017-09-11 VITALS — BP 121/64 | HR 71 | Resp 16 | Ht 64.0 in | Wt 125.0 lb

## 2017-09-11 DIAGNOSIS — I872 Venous insufficiency (chronic) (peripheral): Secondary | ICD-10-CM

## 2017-09-11 DIAGNOSIS — I739 Peripheral vascular disease, unspecified: Secondary | ICD-10-CM | POA: Diagnosis not present

## 2017-09-11 NOTE — Progress Notes (Signed)
Patient ID: Logan Bean, male   DOB: Aug 08, 1920, 84 y.o.   MRN: [id number]  Reason for Consult: New Patient (Initial Visit) (abnormal ABI)   Referred by Lauree Chandler, NP  Subjective:     HPI:  Logan Bean is a 84 y.o. male former smoker he also has history of hypertension diabetes and hypercholesterolemia as risk factors for vascular disease.  He also has varicose veins of bilateral lower extremities has had venous reflux study in the past.  He is now seen in the wound care center for ulcerations on his left ankle and foot mainly on his heel.  He has not had any cellulitis fevers or chills.  He does continue to walk with the help of a walker and lives alone with the help of a nearby caregiver.  Past Medical History:  Diagnosis Date  . Arthritis    left knee  . Diabetes mellitus    diet controlled, pt does not check cbg  . Diaphragmatic hernia without mention of obstruction or gangrene   . Dizziness and giddiness   . Edema   . H/O hiatal hernia   . Hypercholesteremia   . Hypertension   . Left rotator cuff tear Jul 12, 2012  . Stomach ulcer    years  . Unspecified hereditary and idiopathic peripheral neuropathy   . Unspecified vitamin D deficiency   . Varicose veins    both legs   Family History  Problem Relation Age of Onset  . Parkinson's disease Mother   . Hip fracture Mother   . Heart disease Mother   . Alzheimer's disease Mother   . Heart disease Father   . Parkinson's disease Sister   . Heart disease Brother    Past Surgical History:  Procedure Laterality Date  . CARDIOVERSION N/A 02/22/2013   Procedure: CARDIOVERSION;  Surgeon: Darlin Coco, MD;  Location: [facility] ENDOSCOPY;  Service: Cardiovascular;  Laterality: N/A;  . Esopagus strectching  2003 and 3 yrs ago  . MASTECTOMY     bilateral  . ORIF ANKLE FRACTURE Right 09/20/2012   Procedure: OPEN REDUCTION INTERNAL FIXATION (ORIF) ANKLE FRACTURE;  Surgeon: Johnn Hai, MD;  Location: [facility] ORS;   Service: Orthopedics;  Laterality: Right;  . REVERSE SHOULDER ARTHROPLASTY Right 09/17/2012   Procedure: REVERSE SHOULDER ARTHROPLASTY;  Surgeon: Augustin Schooling, MD;  Location: [place];  Service: Orthopedics;  Laterality: Right;  . SHOULDER OPEN ROTATOR CUFF REPAIR  11/05/2011   Procedure: ROTATOR CUFF REPAIR SHOULDER OPEN;  Surgeon: Tobi Bastos, MD;  Location: [facility];  Service: Orthopedics;  Laterality: Left;  Left Shoulder Rotator Cuff Repair Open with Graft and Anchors  . TONSILLECTOMY  age 11    Short Social History:  Social History   Tobacco Use  . Smoking status: Former Smoker    Years: 50.00    Types: Pipe, Cigarettes    Last attempt to quit: 07/14/1996    Years since quitting: 21.1  . Smokeless tobacco: Never Used  Substance Use Topics  . Alcohol use: Yes    Comment: occasional beer    Allergies  Allergen Reactions  . Morphine And Related Nausea And Vomiting  . Oxycodone Nausea And Vomiting    Current Outpatient Medications  Medication Sig Dispense Refill  . acetaminophen (TYLENOL) 325 MG tablet Take 650 mg by mouth every 6 (six) hours as needed.    . Calcium Carbonate-Vitamin D 600-200 MG-UNIT TABS Take 1 tablet by mouth 2 (two) times daily. Take 1 tablet daily for  vitamin supplement.    Marland Kitchen ELIQUIS 2.5 MG TABS tablet TAKE 1 TABLET BY MOUTH TWICE DAILY. 180 tablet 1  . ferrous sulfate 325 (65 FE) MG tablet Take 325 mg by mouth 2 (two) times a week. Takes one tablet by mouth every three days     . furosemide (LASIX) 20 MG tablet TAKE (1/2) TABLET ONCE A DAY FOR EDEMA, HYPERTENSION. 45 tablet 3  . Glucosamine-Chondroit-Vit C-Mn (GLUCOSAMINE 1500 COMPLEX) CAPS Take one tablet once a day 30 each 0  . glucose blood (TRUETEST TEST) test strip E11.22 check blood sugar once daily 50 each PRN  . lisinopril (PRINIVIL,ZESTRIL) 10 MG tablet TAKE 1 TABLET BY MOUTH DAILY. 90 tablet 1  . metFORMIN (GLUCOPHAGE) 500 MG tablet TAKE 1 TABLET BY MOUTH TWICE DAILY WITH A MEAL. 180 tablet 1    . metoprolol tartrate (LOPRESSOR) 25 MG tablet TAKE 1 TABLET BY MOUTH TWICE DAILY. 180 tablet 1  . Multiple Vitamin (MULITIVITAMIN WITH MINERALS) TABS Take 1 tablet by mouth daily.     . Omega-3 Fatty Acids (FISH OIL) 1000 MG CAPS Take 1,000 mg by mouth daily.     . polyvinyl alcohol (LIQUIFILM TEARS) 1.4 % ophthalmic solution Place 1 drop into both eyes as needed for dry eyes.    . simvastatin (ZOCOR) 10 MG tablet TAKE ONE TABLET AT BEDTIME. 90 tablet 1  . TRUEPLUS LANCETS 30G MISC Use to check blood sugar once daily. Dx E11.9 50 each 12   No current facility-administered medications for this visit.     Review of Systems  Constitutional:  Constitutional negative. Eyes: Eyes negative.  Respiratory: Positive for shortness of breath.  Cardiovascular: Positive for dyspnea with exertion and leg swelling.  GI: Gastrointestinal negative.  Musculoskeletal: Positive for leg pain.  Skin: Positive for wound.  Neurological: Neurological negative. Hematologic: Hematologic/lymphatic negative.  Psychiatric: Psychiatric negative.        Objective:  Objective   Vitals:   09/11/17 1355  BP: 121/64  Pulse: 71  Resp: 16  SpO2: 90%  Weight: 125 lb (56.7 kg)  Height: 5\' 4"  (1.626 m)   Body mass index is 21.46 kg/m.  Physical Exam  Constitutional: He is oriented to person, place, and time.  HENT:  Head: Normocephalic.  Neck: Normal range of motion. Neck supple.  Cardiovascular: Normal rate.  Pulses:      Femoral pulses are 2+ on the right side, and 2+ on the left side. Left peroneal signal  Pulmonary/Chest: Effort normal.  Abdominal: Soft. He exhibits no mass.  Musculoskeletal: He exhibits edema.  Lymphadenopathy:    He has no cervical adenopathy.  Neurological: He is alert and oriented to person, place, and time.  Psychiatric: He has a normal mood and affect. His behavior is normal. Judgment and thought content normal.  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  He does have reflux in his left greater saphenous vein but at this time I would not recommend compression given his arterial insufficiency.  We will get him set up for aortogram with left lower extremity runoff possible intervention in the near future.  He will continue with the wound care clinic.  He does take Eliquis and this will need to be held for 48 hours.  If we intervene he will need to be on an antiplatelet agent which I have instructed him.  He demonstrates good understanding.  If we can fix his arterial inflow he may also need venous work in the future.     Waynetta Sandy MD [facility]

## 2017-09-11 NOTE — Progress Notes (Signed)
I offered patient a Monday 09-14-17 procedure time but his daughter Jeani Hawking, decided that this would not be possible with her schedule. I then offered them Wednesday 09-16-17 but again this date did not suit. The daughter stated that she had several conflicts including a trip in the near future. She said she would call me back to schedule this aortogram. Dr. Donzetta Matters was notified.

## 2017-09-12 DIAGNOSIS — S91312D Laceration without foreign body, left foot, subsequent encounter: Secondary | ICD-10-CM | POA: Diagnosis not present

## 2017-09-12 DIAGNOSIS — R5381 Other malaise: Secondary | ICD-10-CM | POA: Diagnosis not present

## 2017-09-12 DIAGNOSIS — I482 Chronic atrial fibrillation: Secondary | ICD-10-CM | POA: Diagnosis not present

## 2017-09-12 DIAGNOSIS — L89892 Pressure ulcer of other site, stage 2: Secondary | ICD-10-CM | POA: Diagnosis not present

## 2017-09-12 DIAGNOSIS — I83892 Varicose veins of left lower extremities with other complications: Secondary | ICD-10-CM | POA: Diagnosis not present

## 2017-09-12 DIAGNOSIS — E1142 Type 2 diabetes mellitus with diabetic polyneuropathy: Secondary | ICD-10-CM | POA: Diagnosis not present

## 2017-09-14 ENCOUNTER — Telehealth: Payer: Self-pay | Admitting: *Deleted

## 2017-09-14 NOTE — Telephone Encounter (Signed)
Son will await call from [place].

## 2017-09-14 NOTE — Telephone Encounter (Signed)
We could place a C3 referral for resources I bet [facility] could help out with this or if they wanted to look into a skill facility for respite care they could do this as well.

## 2017-09-14 NOTE — Telephone Encounter (Signed)
Patient son, Nicki Reaper called and stated that him and his wife needs to go out of town, but patient needs wound care management while they are away. [facility] comes in twice a week but they need someone in between time due to daily dressing changes. They cannot find anyone and knows that it would have to be private pay. [facility] doesn't know of anyone either and they have also placed a call to the wound center. Wants to know if you know of anyone. Please Advise.

## 2017-09-15 DIAGNOSIS — M170 Bilateral primary osteoarthritis of knee: Secondary | ICD-10-CM | POA: Diagnosis not present

## 2017-09-15 DIAGNOSIS — M1712 Unilateral primary osteoarthritis, left knee: Secondary | ICD-10-CM | POA: Diagnosis not present

## 2017-09-15 DIAGNOSIS — M1711 Unilateral primary osteoarthritis, right knee: Secondary | ICD-10-CM | POA: Diagnosis not present

## 2017-09-16 ENCOUNTER — Other Ambulatory Visit: Payer: Self-pay | Admitting: *Deleted

## 2017-09-16 MED ORDER — GLUCOSE BLOOD VI STRP: ORAL_STRIP | 3 refills | Status: DC

## 2017-09-16 NOTE — Telephone Encounter (Signed)
Logan Bean, caregiver Requested Rx to be sent to pharmacy.

## 2017-09-17 DIAGNOSIS — I83892 Varicose veins of left lower extremities with other complications: Secondary | ICD-10-CM | POA: Diagnosis not present

## 2017-09-17 DIAGNOSIS — S91312D Laceration without foreign body, left foot, subsequent encounter: Secondary | ICD-10-CM | POA: Diagnosis not present

## 2017-09-17 DIAGNOSIS — L89892 Pressure ulcer of other site, stage 2: Secondary | ICD-10-CM | POA: Diagnosis not present

## 2017-09-17 DIAGNOSIS — I482 Chronic atrial fibrillation: Secondary | ICD-10-CM | POA: Diagnosis not present

## 2017-09-17 DIAGNOSIS — R5381 Other malaise: Secondary | ICD-10-CM | POA: Diagnosis not present

## 2017-09-17 DIAGNOSIS — E1142 Type 2 diabetes mellitus with diabetic polyneuropathy: Secondary | ICD-10-CM | POA: Diagnosis not present

## 2017-09-18 ENCOUNTER — Encounter (HOSPITAL_BASED_OUTPATIENT_CLINIC_OR_DEPARTMENT_OTHER): Payer: Medicare Other | Attending: Internal Medicine

## 2017-09-18 DIAGNOSIS — I482 Chronic atrial fibrillation: Secondary | ICD-10-CM | POA: Insufficient documentation

## 2017-09-18 DIAGNOSIS — L03116 Cellulitis of left lower limb: Secondary | ICD-10-CM | POA: Diagnosis not present

## 2017-09-18 DIAGNOSIS — Z7901 Long term (current) use of anticoagulants: Secondary | ICD-10-CM | POA: Insufficient documentation

## 2017-09-18 DIAGNOSIS — N183 Chronic kidney disease, stage 3 (moderate): Secondary | ICD-10-CM | POA: Diagnosis not present

## 2017-09-18 DIAGNOSIS — I872 Venous insufficiency (chronic) (peripheral): Secondary | ICD-10-CM | POA: Diagnosis not present

## 2017-09-18 DIAGNOSIS — L97229 Non-pressure chronic ulcer of left calf with unspecified severity: Secondary | ICD-10-CM | POA: Insufficient documentation

## 2017-09-18 DIAGNOSIS — R609 Edema, unspecified: Secondary | ICD-10-CM | POA: Diagnosis not present

## 2017-09-18 DIAGNOSIS — E11621 Type 2 diabetes mellitus with foot ulcer: Secondary | ICD-10-CM | POA: Diagnosis not present

## 2017-09-18 DIAGNOSIS — I130 Hypertensive heart and chronic kidney disease with heart failure and stage 1 through stage 4 chronic kidney disease, or unspecified chronic kidney disease: Secondary | ICD-10-CM | POA: Insufficient documentation

## 2017-09-18 DIAGNOSIS — I509 Heart failure, unspecified: Secondary | ICD-10-CM | POA: Insufficient documentation

## 2017-09-18 DIAGNOSIS — E11622 Type 2 diabetes mellitus with other skin ulcer: Secondary | ICD-10-CM | POA: Diagnosis not present

## 2017-09-18 DIAGNOSIS — E1122 Type 2 diabetes mellitus with diabetic chronic kidney disease: Secondary | ICD-10-CM | POA: Diagnosis not present

## 2017-09-18 DIAGNOSIS — L97422 Non-pressure chronic ulcer of left heel and midfoot with fat layer exposed: Secondary | ICD-10-CM | POA: Diagnosis not present

## 2017-09-18 DIAGNOSIS — L97429 Non-pressure chronic ulcer of left heel and midfoot with unspecified severity: Secondary | ICD-10-CM | POA: Insufficient documentation

## 2017-09-18 DIAGNOSIS — L97322 Non-pressure chronic ulcer of left ankle with fat layer exposed: Secondary | ICD-10-CM | POA: Insufficient documentation

## 2017-09-18 DIAGNOSIS — S91105A Unspecified open wound of left lesser toe(s) without damage to nail, initial encounter: Secondary | ICD-10-CM | POA: Diagnosis not present

## 2017-09-18 DIAGNOSIS — L97222 Non-pressure chronic ulcer of left calf with fat layer exposed: Secondary | ICD-10-CM | POA: Diagnosis not present

## 2017-09-19 DIAGNOSIS — E1142 Type 2 diabetes mellitus with diabetic polyneuropathy: Secondary | ICD-10-CM | POA: Diagnosis not present

## 2017-09-19 DIAGNOSIS — R5381 Other malaise: Secondary | ICD-10-CM | POA: Diagnosis not present

## 2017-09-19 DIAGNOSIS — I482 Chronic atrial fibrillation: Secondary | ICD-10-CM | POA: Diagnosis not present

## 2017-09-19 DIAGNOSIS — S91312D Laceration without foreign body, left foot, subsequent encounter: Secondary | ICD-10-CM | POA: Diagnosis not present

## 2017-09-19 DIAGNOSIS — I83892 Varicose veins of left lower extremities with other complications: Secondary | ICD-10-CM | POA: Diagnosis not present

## 2017-09-19 DIAGNOSIS — L89892 Pressure ulcer of other site, stage 2: Secondary | ICD-10-CM | POA: Diagnosis not present

## 2017-09-25 DIAGNOSIS — R5381 Other malaise: Secondary | ICD-10-CM | POA: Diagnosis not present

## 2017-09-25 DIAGNOSIS — S91312D Laceration without foreign body, left foot, subsequent encounter: Secondary | ICD-10-CM | POA: Diagnosis not present

## 2017-09-25 DIAGNOSIS — I482 Chronic atrial fibrillation: Secondary | ICD-10-CM | POA: Diagnosis not present

## 2017-09-25 DIAGNOSIS — L89892 Pressure ulcer of other site, stage 2: Secondary | ICD-10-CM | POA: Diagnosis not present

## 2017-09-25 DIAGNOSIS — E1142 Type 2 diabetes mellitus with diabetic polyneuropathy: Secondary | ICD-10-CM | POA: Diagnosis not present

## 2017-09-25 DIAGNOSIS — I83892 Varicose veins of left lower extremities with other complications: Secondary | ICD-10-CM | POA: Diagnosis not present

## 2017-09-26 DIAGNOSIS — R5381 Other malaise: Secondary | ICD-10-CM | POA: Diagnosis not present

## 2017-09-26 DIAGNOSIS — E1142 Type 2 diabetes mellitus with diabetic polyneuropathy: Secondary | ICD-10-CM | POA: Diagnosis not present

## 2017-09-26 DIAGNOSIS — I482 Chronic atrial fibrillation: Secondary | ICD-10-CM | POA: Diagnosis not present

## 2017-09-26 DIAGNOSIS — L89892 Pressure ulcer of other site, stage 2: Secondary | ICD-10-CM | POA: Diagnosis not present

## 2017-09-26 DIAGNOSIS — I83892 Varicose veins of left lower extremities with other complications: Secondary | ICD-10-CM | POA: Diagnosis not present

## 2017-09-26 DIAGNOSIS — S91312D Laceration without foreign body, left foot, subsequent encounter: Secondary | ICD-10-CM | POA: Diagnosis not present

## 2017-09-29 DIAGNOSIS — S91312D Laceration without foreign body, left foot, subsequent encounter: Secondary | ICD-10-CM | POA: Diagnosis not present

## 2017-09-29 DIAGNOSIS — I83892 Varicose veins of left lower extremities with other complications: Secondary | ICD-10-CM | POA: Diagnosis not present

## 2017-09-29 DIAGNOSIS — E1142 Type 2 diabetes mellitus with diabetic polyneuropathy: Secondary | ICD-10-CM | POA: Diagnosis not present

## 2017-09-29 DIAGNOSIS — I482 Chronic atrial fibrillation: Secondary | ICD-10-CM | POA: Diagnosis not present

## 2017-09-29 DIAGNOSIS — R5381 Other malaise: Secondary | ICD-10-CM | POA: Diagnosis not present

## 2017-09-29 DIAGNOSIS — L89892 Pressure ulcer of other site, stage 2: Secondary | ICD-10-CM | POA: Diagnosis not present

## 2017-09-30 DIAGNOSIS — I83892 Varicose veins of left lower extremities with other complications: Secondary | ICD-10-CM | POA: Diagnosis not present

## 2017-09-30 DIAGNOSIS — Z7984 Long term (current) use of oral hypoglycemic drugs: Secondary | ICD-10-CM | POA: Diagnosis not present

## 2017-09-30 DIAGNOSIS — E1142 Type 2 diabetes mellitus with diabetic polyneuropathy: Secondary | ICD-10-CM | POA: Diagnosis not present

## 2017-09-30 DIAGNOSIS — Z7901 Long term (current) use of anticoagulants: Secondary | ICD-10-CM | POA: Diagnosis not present

## 2017-09-30 DIAGNOSIS — E440 Moderate protein-calorie malnutrition: Secondary | ICD-10-CM | POA: Diagnosis not present

## 2017-09-30 DIAGNOSIS — I482 Chronic atrial fibrillation: Secondary | ICD-10-CM | POA: Diagnosis not present

## 2017-09-30 DIAGNOSIS — M6281 Muscle weakness (generalized): Secondary | ICD-10-CM | POA: Diagnosis not present

## 2017-09-30 DIAGNOSIS — L89899 Pressure ulcer of other site, unspecified stage: Secondary | ICD-10-CM | POA: Diagnosis not present

## 2017-10-01 ENCOUNTER — Emergency Department (HOSPITAL_BASED_OUTPATIENT_CLINIC_OR_DEPARTMENT_OTHER)
Admit: 2017-10-01 | Discharge: 2017-10-01 | Disposition: A | Discharge status: Home / Self Care | Payer: Medicare Other | Attending: Emergency Medicine | Admitting: Emergency Medicine

## 2017-10-01 ENCOUNTER — Other Ambulatory Visit: Payer: Self-pay

## 2017-10-01 ENCOUNTER — Emergency Department (HOSPITAL_COMMUNITY)
Admission: EM | Admit: 2017-10-01 | Discharge: 2017-10-01 | Disposition: A | Discharge status: Home / Self Care | Payer: Medicare Other | Attending: Emergency Medicine | Admitting: Emergency Medicine

## 2017-10-01 ENCOUNTER — Encounter (HOSPITAL_COMMUNITY): Payer: Self-pay | Admitting: Emergency Medicine

## 2017-10-01 DIAGNOSIS — E11622 Type 2 diabetes mellitus with other skin ulcer: Secondary | ICD-10-CM | POA: Diagnosis not present

## 2017-10-01 DIAGNOSIS — M79609 Pain in unspecified limb: Secondary | ICD-10-CM

## 2017-10-01 DIAGNOSIS — Z7984 Long term (current) use of oral hypoglycemic drugs: Secondary | ICD-10-CM | POA: Insufficient documentation

## 2017-10-01 DIAGNOSIS — L539 Erythematous condition, unspecified: Secondary | ICD-10-CM | POA: Diagnosis not present

## 2017-10-01 DIAGNOSIS — L97229 Non-pressure chronic ulcer of left calf with unspecified severity: Secondary | ICD-10-CM | POA: Diagnosis not present

## 2017-10-01 DIAGNOSIS — R609 Edema, unspecified: Secondary | ICD-10-CM | POA: Diagnosis not present

## 2017-10-01 DIAGNOSIS — Z79899 Other long term (current) drug therapy: Secondary | ICD-10-CM | POA: Insufficient documentation

## 2017-10-01 DIAGNOSIS — L97429 Non-pressure chronic ulcer of left heel and midfoot with unspecified severity: Secondary | ICD-10-CM | POA: Diagnosis not present

## 2017-10-01 DIAGNOSIS — E119 Type 2 diabetes mellitus without complications: Secondary | ICD-10-CM | POA: Diagnosis not present

## 2017-10-01 DIAGNOSIS — I509 Heart failure, unspecified: Secondary | ICD-10-CM | POA: Insufficient documentation

## 2017-10-01 DIAGNOSIS — R600 Localized edema: Secondary | ICD-10-CM | POA: Diagnosis not present

## 2017-10-01 DIAGNOSIS — L97322 Non-pressure chronic ulcer of left ankle with fat layer exposed: Secondary | ICD-10-CM | POA: Diagnosis not present

## 2017-10-01 DIAGNOSIS — L03116 Cellulitis of left lower limb: Secondary | ICD-10-CM | POA: Diagnosis not present

## 2017-10-01 DIAGNOSIS — Z87891 Personal history of nicotine dependence: Secondary | ICD-10-CM | POA: Diagnosis not present

## 2017-10-01 DIAGNOSIS — M7989 Other specified soft tissue disorders: Secondary | ICD-10-CM

## 2017-10-01 DIAGNOSIS — I130 Hypertensive heart and chronic kidney disease with heart failure and stage 1 through stage 4 chronic kidney disease, or unspecified chronic kidney disease: Secondary | ICD-10-CM | POA: Diagnosis not present

## 2017-10-01 DIAGNOSIS — I10 Essential (primary) hypertension: Secondary | ICD-10-CM | POA: Diagnosis not present

## 2017-10-01 LAB — CBC WITH DIFFERENTIAL/PLATELET
BASOS PCT: 1 %
Basophils Absolute: 0 10*3/uL (ref 0.0–0.1)
EOS ABS: 0.1 10*3/uL (ref 0.0–0.7)
Eosinophils Relative: 1 %
HEMATOCRIT: 34.1 % — AB (ref 39.0–52.0)
HEMOGLOBIN: 10.4 g/dL — AB (ref 13.0–17.0)
Lymphocytes Relative: 10 %
Lymphs Abs: 0.9 10*3/uL (ref 0.7–4.0)
MCH: 25.7 pg — ABNORMAL LOW (ref 26.0–34.0)
MCHC: 30.5 g/dL (ref 30.0–36.0)
MCV: 84.2 fL (ref 78.0–100.0)
Monocytes Absolute: 0.6 10*3/uL (ref 0.1–1.0)
Monocytes Relative: 7 %
NEUTROS ABS: 7.1 10*3/uL (ref 1.7–7.7)
NEUTROS PCT: 81 %
Platelets: 339 10*3/uL (ref 150–400)
RBC: 4.05 MIL/uL — AB (ref 4.22–5.81)
RDW: 15.6 % — ABNORMAL HIGH (ref 11.5–15.5)
WBC: 8.7 10*3/uL (ref 4.0–10.5)

## 2017-10-01 LAB — COMPREHENSIVE METABOLIC PANEL
ALK PHOS: 82 U/L (ref 38–126)
ALT: 17 U/L (ref 17–63)
ANION GAP: 10 (ref 5–15)
AST: 16 U/L (ref 15–41)
Albumin: 3.4 g/dL — ABNORMAL LOW (ref 3.5–5.0)
BUN: 40 mg/dL — ABNORMAL HIGH (ref 6–20)
CALCIUM: 9.6 mg/dL (ref 8.9–10.3)
CHLORIDE: 106 mmol/L (ref 101–111)
CO2: 25 mmol/L (ref 22–32)
Creatinine, Ser: 0.84 mg/dL (ref 0.61–1.24)
Glucose, Bld: 171 mg/dL — ABNORMAL HIGH (ref 65–99)
Potassium: 4.5 mmol/L (ref 3.5–5.1)
SODIUM: 141 mmol/L (ref 135–145)
Total Bilirubin: 0.8 mg/dL (ref 0.3–1.2)
Total Protein: 6.5 g/dL (ref 6.5–8.1)

## 2017-10-01 LAB — I-STAT CG4 LACTIC ACID, ED: LACTIC ACID, VENOUS: 1.55 mmol/L (ref 0.5–1.9)

## 2017-10-01 MED ORDER — METFORMIN HCL 500 MG PO TABS
500.0000 mg | ORAL_TABLET | Freq: Two times a day (BID) | ORAL | Status: DC
  Administered 2017-10-01: 500 mg via ORAL
  Filled 2017-10-01: qty 1

## 2017-10-01 MED ORDER — DOXYCYCLINE HYCLATE 100 MG PO CAPS: 100.0000 mg | ORAL_CAPSULE | Freq: Two times a day (BID) | ORAL | 0 refills | Status: DC

## 2017-10-01 MED ORDER — FUROSEMIDE 40 MG PO TABS: 20.0000 mg | ORAL_TABLET | Freq: Every day | ORAL | Status: DC

## 2017-10-01 MED ORDER — APIXABAN 2.5 MG PO TABS: 2.5000 mg | ORAL_TABLET | Freq: Two times a day (BID) | ORAL | Status: DC

## 2017-10-01 MED ORDER — METOPROLOL TARTRATE 25 MG PO TABS: 25.0000 mg | ORAL_TABLET | Freq: Two times a day (BID) | ORAL | Status: DC

## 2017-10-01 MED ORDER — LISINOPRIL 10 MG PO TABS: 10.0000 mg | ORAL_TABLET | Freq: Every day | ORAL | Status: DC

## 2017-10-01 MED ORDER — FERROUS SULFATE 325 (65 FE) MG PO TABS: 325.0000 mg | ORAL_TABLET | ORAL | Status: DC

## 2017-10-01 NOTE — ED Provider Notes (Signed)
[facility] DEPT Provider Note   CSN: [id number] Arrival date & time: 10/01/17  1139    History   Chief Complaint Chief Complaint  Patient presents with  . sent from wound center  . erythema on foot and leg    HPI Logan Bean is a 84 y.o. male.  HPI Patient presents to the emergency room after being referred from the wound center for evaluation of redness and swelling of his left lower extremity.  Patient does have a history of diabetes as well as probable peripheral vascular disease (moderate arterial disease based on ABI in January of this year).  He has had chronic wounds on his left lower extremity that has been managed by the wound clinic.  Patient has dressing changes at home and once a week he goes to the wound clinic.  Family has noticed some increasing redness of his left lower extremity over the past several days.  At the wound clinic the provider was concerned about the redness and swelling of his left lower extremity.  They were concerned about the possibility of developing cellulitis versus a DVT.  They sent the patient to the emergency room to have laboratory test as well as an ultrasound.  Patient denies having any trouble with any pain.  He denies any trouble with fever.  He denies any chest pain or shortness of breath. Past Medical History:  Diagnosis Date  . Arthritis    left knee  . Diabetes mellitus    diet controlled, pt does not check cbg  . Diaphragmatic hernia without mention of obstruction or gangrene   . Dizziness and giddiness   . Edema   . H/O hiatal hernia   . Hypercholesteremia   . Hypertension   . Left rotator cuff tear Jul 12, 2012  . Stomach ulcer    years  . Unspecified hereditary and idiopathic peripheral neuropathy   . Unspecified vitamin D deficiency   . Varicose veins    both legs    Patient Active Problem List   Diagnosis Date Noted  . Iron deficiency anemia secondary to inadequate dietary iron  intake 08/19/2016  . Other constipation 08/19/2016  . Capillary vascular disease 11/21/2015  . Venous ulcer of ankle ([place]) 11/02/2015  . Near syncope 04/27/2015  . Type 2 diabetes mellitus with hyperglycemia ([place]) 04/27/2015  . Contusion of multiple sites 04/26/2015  . Type 2 diabetes mellitus with diabetic chronic kidney disease ([place]) 07/11/2014  . Colon cancer screening 07/11/2014  . Hygroma 05/19/2014  . Atrial fibrillation with RVR ([place]) 05/18/2014  . Laceration of skin of face 05/18/2014  . Fall   . Venous stasis dermatitis 05/16/2014  . Carpal tunnel syndrome 05/10/2014  . CKD (chronic kidney disease) stage 3, GFR 30-59 ml/min (HCC) 10/11/2013  . Restless leg syndrome 05/25/2013  . Hyperthyroidism, subclinical 03/02/2013  . Chronic anticoagulation-Eliquis 01/31/2013  . Chronic atrial fibrillation ([place]) 01/21/2013  . Sweating increase 01/19/2013  . CHF (congestive heart failure) ([place]) 01/19/2013  . Pleural effusion 01/19/2013  . Multinodular goiter 01/19/2013  . Dyspnea 01/15/2013  . Edema 01/15/2013  . Hypokalemia 01/15/2013  . Rash and nonspecific skin eruption 01/15/2013  . Dyslipidemia 12/29/2012  . GERD (gastroesophageal reflux disease) 12/29/2012  . Loss of weight 12/29/2012  . Closed bimalleolar fracture of right ankle 09/28/2012  . Diarrhea 09/22/2012  . Essential hypertension 09/21/2012  . Complete tear of left rotator cuff 11/05/2011    Past Surgical History:  Procedure Laterality Date  . CARDIOVERSION N/A  02/22/2013   Procedure: CARDIOVERSION;  Surgeon: Darlin Coco, MD;  Location: [facility] ENDOSCOPY;  Service: Cardiovascular;  Laterality: N/A;  . Esopagus strectching  2003 and 3 yrs ago  . MASTECTOMY     bilateral  . ORIF ANKLE FRACTURE Right 09/20/2012   Procedure: OPEN REDUCTION INTERNAL FIXATION (ORIF) ANKLE FRACTURE;  Surgeon: Johnn Hai, MD;  Location: [facility] ORS;  Service: Orthopedics;  Laterality: Right;  . REVERSE SHOULDER ARTHROPLASTY Right  09/17/2012   Procedure: REVERSE SHOULDER ARTHROPLASTY;  Surgeon: Augustin Schooling, MD;  Location: [facility];  Service: Orthopedics;  Laterality: Right;  . SHOULDER OPEN ROTATOR CUFF REPAIR  11/05/2011   Procedure: ROTATOR CUFF REPAIR SHOULDER OPEN;  Surgeon: Tobi Bastos, MD;  Location: [facility] ORS;  Service: Orthopedics;  Laterality: Left;  Left Shoulder Rotator Cuff Repair Open with Graft and Anchors  . TONSILLECTOMY  age 5    OB History   None      Home Medications    Prior to Admission medications   Medication Sig Start Date End Date Taking? Authorizing Provider  acetaminophen (TYLENOL) 500 MG tablet Take 650 mg by mouth every 6 (six) hours as needed.   Yes [provider]  Calcium Carbonate-Vitamin D 600-200 MG-UNIT TABS Take 1 tablet by mouth 2 (two) times daily. Take 1 tablet daily for vitamin supplement.   Yes [provider]  ELIQUIS 2.5 MG TABS tablet TAKE 1 TABLET BY MOUTH TWICE DAILY. 06/11/17  Yes Lauree Chandler, NP  ferrous sulfate 325 (65 FE) MG tablet Take 325 mg by mouth 2 (two) times a week. Takes one tablet by mouth every three days    Yes [provider]  furosemide (LASIX) 20 MG tablet TAKE (1/2) TABLET ONCE A DAY FOR EDEMA, HYPERTENSION. 12/23/16  Yes Lauree Chandler, NP  Glucosamine-Chondroit-Vit C-Mn (GLUCOSAMINE 1500 COMPLEX) CAPS Take one tablet once a day 01/12/13  Yes Bubba Camp, Mahima, MD  lisinopril (PRINIVIL,ZESTRIL) 10 MG tablet TAKE 1 TABLET BY MOUTH DAILY. 06/11/17  Yes Lauree Chandler, NP  metFORMIN (GLUCOPHAGE) 500 MG tablet TAKE 1 TABLET BY MOUTH TWICE DAILY WITH A MEAL. 06/11/17  Yes Lauree Chandler, NP  metoprolol tartrate (LOPRESSOR) 25 MG tablet TAKE 1 TABLET BY MOUTH TWICE DAILY. 06/11/17  Yes Lauree Chandler, NP  Multiple Vitamin (MULITIVITAMIN WITH MINERALS) TABS Take 1 tablet by mouth daily.    Yes [provider]  Omega-3 Fatty Acids (FISH OIL) 1000 MG CAPS Take 1,000 mg by mouth daily.    Yes [provider]  polyvinyl alcohol (LIQUIFILM TEARS) 1.4 % ophthalmic solution Place 1 drop into both eyes as needed for dry eyes.   Yes [provider]  simvastatin (ZOCOR) 10 MG tablet TAKE ONE TABLET AT BEDTIME. 06/11/17  Yes Lauree Chandler, NP  doxycycline (VIBRAMYCIN) 100 MG capsule Take 1 capsule (100 mg total) by mouth 2 (two) times daily. 10/01/17   Dorie Rank, MD  glucose blood (TRUETEST TEST) test strip Use to check blood sugar once daily Dx:E11.22 09/16/17   Lauree Chandler, NP  TRUEPLUS LANCETS 30G MISC Use to check blood sugar once daily. Dx E11.9 06/13/15   Lauree Chandler, NP    Family History Family History  Problem Relation Age of Onset  . Parkinson's disease Mother   . Hip fracture Mother   . Heart disease Mother   . Alzheimer's disease Mother   . Heart disease Father   . Parkinson's disease Sister   . Heart disease  Brother     Social History Social History   Tobacco Use  . Smoking status: Former Smoker    Years: 50.00    Types: Pipe, Cigarettes    Last attempt to quit: 07/14/1996    Years since quitting: 21.2  . Smokeless tobacco: Never Used  Substance Use Topics  . Alcohol use: Yes    Comment: occasional beer  . Drug use: No     Allergies   Morphine and related and Oxycodone   Review of Systems Review of Systems  All other systems reviewed and are negative.    Physical Exam Updated Vital Signs BP (!) 147/98 (BP Location: Left Arm)   Pulse 100   Temp 98.1 F (36.7 C) (Oral)   Resp 18   SpO2 99%   Physical Exam  Constitutional: No distress.  Elderly, frail  HENT:  Head: Normocephalic and atraumatic.  Right Ear: External ear normal.  Left Ear: External ear normal.  Eyes: Conjunctivae are normal. Right eye exhibits no discharge. Left eye exhibits no discharge. No scleral icterus.  Neck: Neck supple. No tracheal deviation present.  Cardiovascular: Normal rate, regular rhythm and intact distal pulses.  Pulmonary/Chest:  Effort normal and breath sounds normal. No stridor. No respiratory distress. He has no wheezes. He has no rales.  Abdominal: Soft. Bowel sounds are normal. He exhibits no distension. There is no tenderness. There is no rebound and no guarding.  Musculoskeletal: He exhibits edema. He exhibits no tenderness.       Left lower leg: He exhibits swelling and edema. He exhibits no tenderness.  Erythema of the left lower extremity involving the foot and lower extremity up to the knee, few superficial ulcerations approximately 3 mm in depth with pale granulation tissue at the base, no purulent drainage  Neurological: He is alert. He has normal strength. No cranial nerve deficit (no facial droop, extraocular movements intact, no slurred speech) or sensory deficit. He exhibits normal muscle tone. He displays no seizure activity. Coordination normal.  Skin: Skin is warm and dry. No rash noted.  Psychiatric: He has a normal mood and affect.  Nursing note and vitals reviewed.    ED Treatments / Results  Labs (all labs ordered are listed, but only abnormal results are displayed) Labs Reviewed  COMPREHENSIVE METABOLIC PANEL - Abnormal; Notable for the following components:      Result Value   Glucose, Bld 171 (*)    BUN 40 (*)    Albumin 3.4 (*)    All other components within normal limits  CBC WITH DIFFERENTIAL/PLATELET - Abnormal; Notable for the following components:   RBC 4.05 (*)    Hemoglobin 10.4 (*)    HCT 34.1 (*)    MCH 25.7 (*)    RDW 15.6 (*)    All other components within normal limits  I-STAT CG4 LACTIC ACID, ED  I-STAT CG4 LACTIC ACID, ED    Radiology Prelim report on doppler US by Maudry Mayhew, negative for DVT, doplerable pulses Procedures Procedures (including critical care time)  Medications Ordered in ED Medications  apixaban (ELIQUIS) tablet 2.5 mg (has no administration in time range)  metoprolol tartrate (LOPRESSOR) tablet 25 mg (has no administration in time  range)  metFORMIN (GLUCOPHAGE) tablet 500 mg (has no administration in time range)  lisinopril (PRINIVIL,ZESTRIL) tablet 10 mg (has no administration in time range)  ferrous sulfate tablet 325 mg (has no administration in time range)  furosemide (LASIX) tablet 20 mg (has no administration in time range)  Initial Impression / Assessment and Plan / ED Course  I have reviewed the triage vital signs and the nursing notes.  Pertinent labs & imaging results that were available during my care of the patient were reviewed by me and considered in my medical decision making (see chart for details).  Clinical Course as of Oct 02 1702  Thu Oct 01, 2017  1601 Hgb decreased from previous values.  No active bleeding noted on exam.   CBC with Differential(!) [JK]  1602 No sig change compared to previous  Comprehensive metabolic panel(!) [JK]  5456 Doppler study negative.  Updated family. Son in law states pt has not has his home medications today.  I have ordered them.   [JK]    Clinical Course User Index [JK] Dorie Rank, MD    No DVT noted on [place].  Patient has known peripheral vascular disease but I do not see any signs of acute vascular occlusion.  I am concerned about the possibility of cellulitis.  He does not have a fever and does not have an elevated white blood cell count but his exam does suggest the possibility of cellulitis.  Family is very concerned about antibiotics because he had C. difficile many years ago.  I discussed the options of continued observation versus starting him on antibiotics.  I think it is reasonable for him to take oral doxycycline and have him follow-up with his primary care doctor.  Final Clinical Impressions(s) / ED Diagnoses   Final diagnoses:  Leg erythema    ED Discharge Orders        Ordered    doxycycline (VIBRAMYCIN) 100 MG capsule  2 times daily     10/01/17 1657       Dorie Rank, MD 10/01/17 1704

## 2017-10-01 NOTE — Discharge Instructions (Signed)
Take the antibiotics as prescribed, follow up with your primary care doctor or wound care doctor to have your leg rechecked in the next week, monitor for fever, worsening symptoms

## 2017-10-01 NOTE — ED Notes (Signed)
Pt is alert and oriented x 4 and is verbally responsive. Pt denies pain. Pt has redness to left LE. Area is warm to touch. ` pt has a

## 2017-10-01 NOTE — Progress Notes (Signed)
*  Preliminary Results* Left lower extremity venous duplex completed. Left lower extremity is negative for deep vein thrombosis. There is no evidence of left Baker's cyst.  10/01/2017 4:34 PM  Maudry Mayhew, BS, RVT, RDCS, RDMS

## 2017-10-01 NOTE — ED Notes (Signed)
Patient transported to Ultrasound 

## 2017-10-01 NOTE — ED Triage Notes (Signed)
Pt sent from wound center for evaluation of foot infection and erythema that has now radiated up to left knee.

## 2017-10-01 NOTE — ED Notes (Signed)
PATIENT'S WOUNDS ON LEFT ANKLE AND FOOT DRESSED AND BANDAGED WITH XEROFORM, 4X4 DRESSING AND 4 INCH KLING. PATIENT HAD GOOD PMS AFTER DRESSING APPLIED

## 2017-10-02 DIAGNOSIS — L89899 Pressure ulcer of other site, unspecified stage: Secondary | ICD-10-CM | POA: Diagnosis not present

## 2017-10-02 DIAGNOSIS — I482 Chronic atrial fibrillation: Secondary | ICD-10-CM | POA: Diagnosis not present

## 2017-10-02 DIAGNOSIS — E1142 Type 2 diabetes mellitus with diabetic polyneuropathy: Secondary | ICD-10-CM | POA: Diagnosis not present

## 2017-10-02 DIAGNOSIS — M6281 Muscle weakness (generalized): Secondary | ICD-10-CM | POA: Diagnosis not present

## 2017-10-02 DIAGNOSIS — I83892 Varicose veins of left lower extremities with other complications: Secondary | ICD-10-CM | POA: Diagnosis not present

## 2017-10-02 DIAGNOSIS — E440 Moderate protein-calorie malnutrition: Secondary | ICD-10-CM | POA: Diagnosis not present

## 2017-10-05 ENCOUNTER — Telehealth: Payer: Self-pay | Admitting: *Deleted

## 2017-10-05 ENCOUNTER — Other Ambulatory Visit: Payer: Self-pay | Admitting: *Deleted

## 2017-10-05 NOTE — Telephone Encounter (Signed)
Daughter, Jeani Hawking, called to scheduled patient for aortogram on 10-19-17. He will be an OBS admission afterwards. Jeani Hawking was instructed to hold patient's Eliquis for 48 prior to procedure, last dose will be on Friday 10-16-17. He will only take his morning dose of Metformin the day prior to surgery. Jeani Hawking voiced understanding of all these instructions, verified by teachback method.

## 2017-10-06 DIAGNOSIS — M6281 Muscle weakness (generalized): Secondary | ICD-10-CM | POA: Diagnosis not present

## 2017-10-06 DIAGNOSIS — L89899 Pressure ulcer of other site, unspecified stage: Secondary | ICD-10-CM | POA: Diagnosis not present

## 2017-10-06 DIAGNOSIS — I83892 Varicose veins of left lower extremities with other complications: Secondary | ICD-10-CM | POA: Diagnosis not present

## 2017-10-06 DIAGNOSIS — E1142 Type 2 diabetes mellitus with diabetic polyneuropathy: Secondary | ICD-10-CM | POA: Diagnosis not present

## 2017-10-06 DIAGNOSIS — E440 Moderate protein-calorie malnutrition: Secondary | ICD-10-CM | POA: Diagnosis not present

## 2017-10-06 DIAGNOSIS — I482 Chronic atrial fibrillation: Secondary | ICD-10-CM | POA: Diagnosis not present

## 2017-10-08 DIAGNOSIS — L97429 Non-pressure chronic ulcer of left heel and midfoot with unspecified severity: Secondary | ICD-10-CM | POA: Diagnosis not present

## 2017-10-08 DIAGNOSIS — S81802A Unspecified open wound, left lower leg, initial encounter: Secondary | ICD-10-CM | POA: Diagnosis not present

## 2017-10-08 DIAGNOSIS — I130 Hypertensive heart and chronic kidney disease with heart failure and stage 1 through stage 4 chronic kidney disease, or unspecified chronic kidney disease: Secondary | ICD-10-CM | POA: Diagnosis not present

## 2017-10-08 DIAGNOSIS — E11622 Type 2 diabetes mellitus with other skin ulcer: Secondary | ICD-10-CM | POA: Diagnosis not present

## 2017-10-08 DIAGNOSIS — L03116 Cellulitis of left lower limb: Secondary | ICD-10-CM | POA: Diagnosis not present

## 2017-10-08 DIAGNOSIS — S91002A Unspecified open wound, left ankle, initial encounter: Secondary | ICD-10-CM | POA: Diagnosis not present

## 2017-10-08 DIAGNOSIS — L97322 Non-pressure chronic ulcer of left ankle with fat layer exposed: Secondary | ICD-10-CM | POA: Diagnosis not present

## 2017-10-08 DIAGNOSIS — L97229 Non-pressure chronic ulcer of left calf with unspecified severity: Secondary | ICD-10-CM | POA: Diagnosis not present

## 2017-10-08 DIAGNOSIS — S91302A Unspecified open wound, left foot, initial encounter: Secondary | ICD-10-CM | POA: Diagnosis not present

## 2017-10-09 DIAGNOSIS — M6281 Muscle weakness (generalized): Secondary | ICD-10-CM | POA: Diagnosis not present

## 2017-10-09 DIAGNOSIS — E1142 Type 2 diabetes mellitus with diabetic polyneuropathy: Secondary | ICD-10-CM | POA: Diagnosis not present

## 2017-10-09 DIAGNOSIS — L89899 Pressure ulcer of other site, unspecified stage: Secondary | ICD-10-CM | POA: Diagnosis not present

## 2017-10-09 DIAGNOSIS — E440 Moderate protein-calorie malnutrition: Secondary | ICD-10-CM | POA: Diagnosis not present

## 2017-10-09 DIAGNOSIS — I482 Chronic atrial fibrillation: Secondary | ICD-10-CM | POA: Diagnosis not present

## 2017-10-09 DIAGNOSIS — I83892 Varicose veins of left lower extremities with other complications: Secondary | ICD-10-CM | POA: Diagnosis not present

## 2017-10-12 DIAGNOSIS — M6281 Muscle weakness (generalized): Secondary | ICD-10-CM | POA: Diagnosis not present

## 2017-10-12 DIAGNOSIS — I482 Chronic atrial fibrillation: Secondary | ICD-10-CM | POA: Diagnosis not present

## 2017-10-12 DIAGNOSIS — E440 Moderate protein-calorie malnutrition: Secondary | ICD-10-CM | POA: Diagnosis not present

## 2017-10-12 DIAGNOSIS — E1142 Type 2 diabetes mellitus with diabetic polyneuropathy: Secondary | ICD-10-CM | POA: Diagnosis not present

## 2017-10-12 DIAGNOSIS — L89899 Pressure ulcer of other site, unspecified stage: Secondary | ICD-10-CM | POA: Diagnosis not present

## 2017-10-12 DIAGNOSIS — I83892 Varicose veins of left lower extremities with other complications: Secondary | ICD-10-CM | POA: Diagnosis not present

## 2017-10-15 DIAGNOSIS — M1712 Unilateral primary osteoarthritis, left knee: Secondary | ICD-10-CM | POA: Diagnosis not present

## 2017-10-15 DIAGNOSIS — M1711 Unilateral primary osteoarthritis, right knee: Secondary | ICD-10-CM | POA: Diagnosis not present

## 2017-10-17 DIAGNOSIS — E1142 Type 2 diabetes mellitus with diabetic polyneuropathy: Secondary | ICD-10-CM | POA: Diagnosis not present

## 2017-10-17 DIAGNOSIS — I83892 Varicose veins of left lower extremities with other complications: Secondary | ICD-10-CM | POA: Diagnosis not present

## 2017-10-17 DIAGNOSIS — I482 Chronic atrial fibrillation: Secondary | ICD-10-CM | POA: Diagnosis not present

## 2017-10-17 DIAGNOSIS — L89899 Pressure ulcer of other site, unspecified stage: Secondary | ICD-10-CM | POA: Diagnosis not present

## 2017-10-17 DIAGNOSIS — M6281 Muscle weakness (generalized): Secondary | ICD-10-CM | POA: Diagnosis not present

## 2017-10-17 DIAGNOSIS — E440 Moderate protein-calorie malnutrition: Secondary | ICD-10-CM | POA: Diagnosis not present

## 2017-10-20 DIAGNOSIS — M6281 Muscle weakness (generalized): Secondary | ICD-10-CM | POA: Diagnosis not present

## 2017-10-20 DIAGNOSIS — I482 Chronic atrial fibrillation: Secondary | ICD-10-CM | POA: Diagnosis not present

## 2017-10-20 DIAGNOSIS — E440 Moderate protein-calorie malnutrition: Secondary | ICD-10-CM | POA: Diagnosis not present

## 2017-10-20 DIAGNOSIS — L89899 Pressure ulcer of other site, unspecified stage: Secondary | ICD-10-CM | POA: Diagnosis not present

## 2017-10-20 DIAGNOSIS — E1142 Type 2 diabetes mellitus with diabetic polyneuropathy: Secondary | ICD-10-CM | POA: Diagnosis not present

## 2017-10-20 DIAGNOSIS — I83892 Varicose veins of left lower extremities with other complications: Secondary | ICD-10-CM | POA: Diagnosis not present

## 2017-10-21 ENCOUNTER — Ambulatory Visit (HOSPITAL_COMMUNITY)
Admission: RE | Disposition: A | Discharge status: Home / Self Care | Payer: Self-pay | Source: Ambulatory Visit | Attending: Vascular Surgery

## 2017-10-21 ENCOUNTER — Encounter (HOSPITAL_COMMUNITY): Payer: Self-pay | Admitting: General Practice

## 2017-10-21 ENCOUNTER — Observation Stay (HOSPITAL_COMMUNITY)
Admission: RE | Admit: 2017-10-21 | Discharge: 2017-10-22 | Disposition: A | Discharge status: Home / Self Care | Payer: Medicare Other | Source: Ambulatory Visit | Attending: Vascular Surgery | Admitting: Vascular Surgery

## 2017-10-21 ENCOUNTER — Other Ambulatory Visit: Payer: Self-pay

## 2017-10-21 DIAGNOSIS — Z7984 Long term (current) use of oral hypoglycemic drugs: Secondary | ICD-10-CM | POA: Insufficient documentation

## 2017-10-21 DIAGNOSIS — Z96611 Presence of right artificial shoulder joint: Secondary | ICD-10-CM | POA: Insufficient documentation

## 2017-10-21 DIAGNOSIS — Z9889 Other specified postprocedural states: Secondary | ICD-10-CM | POA: Diagnosis not present

## 2017-10-21 DIAGNOSIS — E1122 Type 2 diabetes mellitus with diabetic chronic kidney disease: Secondary | ICD-10-CM | POA: Diagnosis not present

## 2017-10-21 DIAGNOSIS — Z87891 Personal history of nicotine dependence: Secondary | ICD-10-CM | POA: Insufficient documentation

## 2017-10-21 DIAGNOSIS — I8393 Asymptomatic varicose veins of bilateral lower extremities: Secondary | ICD-10-CM | POA: Diagnosis not present

## 2017-10-21 DIAGNOSIS — E11621 Type 2 diabetes mellitus with foot ulcer: Secondary | ICD-10-CM | POA: Diagnosis not present

## 2017-10-21 DIAGNOSIS — Z79899 Other long term (current) drug therapy: Secondary | ICD-10-CM | POA: Diagnosis not present

## 2017-10-21 DIAGNOSIS — E559 Vitamin D deficiency, unspecified: Secondary | ICD-10-CM | POA: Insufficient documentation

## 2017-10-21 DIAGNOSIS — Z7902 Long term (current) use of antithrombotics/antiplatelets: Secondary | ICD-10-CM | POA: Diagnosis not present

## 2017-10-21 DIAGNOSIS — M1712 Unilateral primary osteoarthritis, left knee: Secondary | ICD-10-CM | POA: Diagnosis not present

## 2017-10-21 DIAGNOSIS — Z9013 Acquired absence of bilateral breasts and nipples: Secondary | ICD-10-CM | POA: Insufficient documentation

## 2017-10-21 DIAGNOSIS — Z8719 Personal history of other diseases of the digestive system: Secondary | ICD-10-CM | POA: Insufficient documentation

## 2017-10-21 DIAGNOSIS — I998 Other disorder of circulatory system: Secondary | ICD-10-CM | POA: Diagnosis not present

## 2017-10-21 DIAGNOSIS — E7800 Pure hypercholesterolemia, unspecified: Secondary | ICD-10-CM | POA: Insufficient documentation

## 2017-10-21 DIAGNOSIS — Z820 Family history of epilepsy and other diseases of the nervous system: Secondary | ICD-10-CM | POA: Insufficient documentation

## 2017-10-21 DIAGNOSIS — I129 Hypertensive chronic kidney disease with stage 1 through stage 4 chronic kidney disease, or unspecified chronic kidney disease: Secondary | ICD-10-CM | POA: Insufficient documentation

## 2017-10-21 DIAGNOSIS — Z885 Allergy status to narcotic agent status: Secondary | ICD-10-CM | POA: Insufficient documentation

## 2017-10-21 DIAGNOSIS — L97421 Non-pressure chronic ulcer of left heel and midfoot limited to breakdown of skin: Secondary | ICD-10-CM | POA: Insufficient documentation

## 2017-10-21 DIAGNOSIS — I70243 Atherosclerosis of native arteries of left leg with ulceration of ankle: Secondary | ICD-10-CM | POA: Diagnosis not present

## 2017-10-21 DIAGNOSIS — E1142 Type 2 diabetes mellitus with diabetic polyneuropathy: Secondary | ICD-10-CM | POA: Insufficient documentation

## 2017-10-21 DIAGNOSIS — Z8249 Family history of ischemic heart disease and other diseases of the circulatory system: Secondary | ICD-10-CM | POA: Diagnosis not present

## 2017-10-21 DIAGNOSIS — N183 Chronic kidney disease, stage 3 (moderate): Secondary | ICD-10-CM | POA: Insufficient documentation

## 2017-10-21 DIAGNOSIS — I739 Peripheral vascular disease, unspecified: Secondary | ICD-10-CM

## 2017-10-21 HISTORY — DX: Peripheral vascular disease, unspecified: I73.9

## 2017-10-21 HISTORY — DX: Anxiety disorder, unspecified: F41.9

## 2017-10-21 HISTORY — DX: Personal history of other diseases of the musculoskeletal system and connective tissue: Z87.39

## 2017-10-21 HISTORY — DX: Personal history of urinary calculi: Z87.442

## 2017-10-21 HISTORY — DX: Personal history of other diseases of the digestive system: Z87.19

## 2017-10-21 HISTORY — PX: ABDOMINAL AORTOGRAM W/LOWER EXTREMITY: CATH118223

## 2017-10-21 HISTORY — DX: Major depressive disorder, single episode, unspecified: F32.9

## 2017-10-21 HISTORY — DX: Personal history of peptic ulcer disease: Z87.11

## 2017-10-21 HISTORY — DX: Depression, unspecified: F32.A

## 2017-10-21 HISTORY — DX: Type 2 diabetes mellitus without complications: E11.9

## 2017-10-21 HISTORY — PX: PERIPHERAL VASCULAR INTERVENTION: CATH118257

## 2017-10-21 LAB — GLUCOSE, CAPILLARY
Glucose-Capillary: 184 mg/dL — ABNORMAL HIGH (ref 65–99)
Glucose-Capillary: 260 mg/dL — ABNORMAL HIGH (ref 65–99)
Glucose-Capillary: 161 mg/dL — ABNORMAL HIGH (ref 65–99)

## 2017-10-21 LAB — POCT I-STAT, CHEM 8
BUN: 36 mg/dL — ABNORMAL HIGH (ref 6–20)
CALCIUM ION: 1.14 mmol/L — AB (ref 1.15–1.40)
Chloride: 105 mmol/L (ref 101–111)
Creatinine, Ser: 0.8 mg/dL (ref 0.61–1.24)
Glucose, Bld: 176 mg/dL — ABNORMAL HIGH (ref 65–99)
HCT: 33 % — ABNORMAL LOW (ref 39.0–52.0)
Hemoglobin: 11.2 g/dL — ABNORMAL LOW (ref 13.0–17.0)
POTASSIUM: 4.6 mmol/L (ref 3.5–5.1)
SODIUM: 137 mmol/L (ref 135–145)
TCO2: 25 mmol/L (ref 22–32)

## 2017-10-21 SURGERY — ABDOMINAL AORTOGRAM W/LOWER EXTREMITY
Anesthesia: LOCAL

## 2017-10-21 MED ORDER — POLYVINYL ALCOHOL 1.4 % OP SOLN
1.0000 [drp] | OPHTHALMIC | Status: DC | PRN
  Filled 2017-10-21: qty 15

## 2017-10-21 MED ORDER — MORPHINE SULFATE (PF) 2 MG/ML IV SOLN: 2.0000 mg | INTRAVENOUS | Status: DC | PRN

## 2017-10-21 MED ORDER — LABETALOL HCL 5 MG/ML IV SOLN
INTRAVENOUS | Status: AC
  Filled 2017-10-21: qty 4

## 2017-10-21 MED ORDER — HEPARIN (PORCINE) IN NACL 2-0.9 UNIT/ML-% IJ SOLN
INTRAMUSCULAR | Status: AC
  Filled 2017-10-21: qty 500

## 2017-10-21 MED ORDER — MORPHINE SULFATE (PF) 10 MG/ML IV SOLN: 2.0000 mg | INTRAVENOUS | Status: DC | PRN

## 2017-10-21 MED ORDER — SIMVASTATIN 10 MG PO TABS
10.0000 mg | ORAL_TABLET | Freq: Every day | ORAL | Status: DC
  Administered 2017-10-21: 10 mg via ORAL
  Filled 2017-10-21: qty 1

## 2017-10-21 MED ORDER — LIDOCAINE HCL 1 % IJ SOLN
INTRAMUSCULAR | Status: AC
  Filled 2017-10-21: qty 20

## 2017-10-21 MED ORDER — CALCIUM CARBONATE-VITAMIN D 500-200 MG-UNIT PO TABS
1.0000 | ORAL_TABLET | Freq: Two times a day (BID) | ORAL | Status: DC
  Filled 2017-10-21: qty 1

## 2017-10-21 MED ORDER — ONDANSETRON HCL 4 MG/2ML IJ SOLN: 4.0000 mg | Freq: Four times a day (QID) | INTRAMUSCULAR | Status: DC | PRN

## 2017-10-21 MED ORDER — INSULIN ASPART 100 UNIT/ML ~~LOC~~ SOLN
0.0000 [IU] | Freq: Three times a day (TID) | SUBCUTANEOUS | Status: DC
  Administered 2017-10-21: 19:00:00 5 [IU] via SUBCUTANEOUS
  Administered 2017-10-22: 07:00:00 2 [IU] via SUBCUTANEOUS

## 2017-10-21 MED ORDER — CLOPIDOGREL BISULFATE 300 MG PO TABS
ORAL_TABLET | ORAL | Status: AC
  Filled 2017-10-21: qty 1

## 2017-10-21 MED ORDER — CLOPIDOGREL BISULFATE 75 MG PO TABS
75.0000 mg | ORAL_TABLET | Freq: Every day | ORAL | Status: DC
  Administered 2017-10-22: 75 mg via ORAL
  Filled 2017-10-21: qty 1

## 2017-10-21 MED ORDER — FENTANYL CITRATE (PF) 100 MCG/2ML IJ SOLN
INTRAMUSCULAR | Status: DC | PRN
  Administered 2017-10-21 (×4): 25 ug via INTRAVENOUS

## 2017-10-21 MED ORDER — LABETALOL HCL 5 MG/ML IV SOLN
INTRAVENOUS | Status: DC | PRN
  Administered 2017-10-21 (×2): 10 mg via INTRAVENOUS

## 2017-10-21 MED ORDER — APIXABAN 2.5 MG PO TABS
2.5000 mg | ORAL_TABLET | Freq: Two times a day (BID) | ORAL | Status: DC
  Administered 2017-10-22: 2.5 mg via ORAL
  Filled 2017-10-21: qty 1

## 2017-10-21 MED ORDER — METOPROLOL TARTRATE 25 MG PO TABS
25.0000 mg | ORAL_TABLET | Freq: Two times a day (BID) | ORAL | Status: DC
  Administered 2017-10-21 – 2017-10-22 (×2): 25 mg via ORAL
  Filled 2017-10-21 (×2): qty 1

## 2017-10-21 MED ORDER — FERROUS SULFATE 325 (65 FE) MG PO TABS: 325.0000 mg | ORAL_TABLET | ORAL | Status: DC

## 2017-10-21 MED ORDER — SODIUM CHLORIDE 0.9 % IV SOLN: 250.0000 mL | INTRAVENOUS | Status: DC | PRN

## 2017-10-21 MED ORDER — FENTANYL CITRATE (PF) 100 MCG/2ML IJ SOLN
INTRAMUSCULAR | Status: AC
  Filled 2017-10-21: qty 2

## 2017-10-21 MED ORDER — CLOPIDOGREL BISULFATE 300 MG PO TABS
ORAL_TABLET | ORAL | Status: DC | PRN
  Administered 2017-10-21: 300 mg via ORAL

## 2017-10-21 MED ORDER — HEPARIN SODIUM (PORCINE) 1000 UNIT/ML IJ SOLN
INTRAMUSCULAR | Status: AC
  Filled 2017-10-21: qty 1

## 2017-10-21 MED ORDER — SODIUM CHLORIDE 0.9% FLUSH: 3.0000 mL | INTRAVENOUS | Status: DC | PRN

## 2017-10-21 MED ORDER — ANGIOPLASTY BOOK
Freq: Once | Status: AC
  Administered 2017-10-22: 01:00:00
  Filled 2017-10-21: qty 1

## 2017-10-21 MED ORDER — INSULIN ASPART 100 UNIT/ML ~~LOC~~ SOLN
3.0000 [IU] | Freq: Three times a day (TID) | SUBCUTANEOUS | Status: DC
  Administered 2017-10-21: 3 [IU] via SUBCUTANEOUS

## 2017-10-21 MED ORDER — LABETALOL HCL 5 MG/ML IV SOLN: 10.0000 mg | INTRAVENOUS | Status: DC | PRN

## 2017-10-21 MED ORDER — HEPARIN SODIUM (PORCINE) 1000 UNIT/ML IJ SOLN
INTRAMUSCULAR | Status: DC | PRN
  Administered 2017-10-21: 7000 [IU] via INTRAVENOUS

## 2017-10-21 MED ORDER — ADULT MULTIVITAMIN W/MINERALS CH: 1.0000 | ORAL_TABLET | Freq: Every day | ORAL | Status: DC

## 2017-10-21 MED ORDER — HYDRALAZINE HCL 20 MG/ML IJ SOLN: 5.0000 mg | INTRAMUSCULAR | Status: DC | PRN

## 2017-10-21 MED ORDER — OXYCODONE HCL 5 MG PO TABS: 5.0000 mg | ORAL_TABLET | ORAL | Status: DC | PRN

## 2017-10-21 MED ORDER — LISINOPRIL 10 MG PO TABS
10.0000 mg | ORAL_TABLET | Freq: Every day | ORAL | Status: DC
  Administered 2017-10-21: 22:00:00 10 mg via ORAL
  Filled 2017-10-21: qty 1

## 2017-10-21 MED ORDER — LIDOCAINE HCL (PF) 1 % IJ SOLN
INTRAMUSCULAR | Status: DC | PRN
  Administered 2017-10-21: 15 mL

## 2017-10-21 MED ORDER — SODIUM CHLORIDE 0.9 % IV SOLN
INTRAVENOUS | Status: DC
  Administered 2017-10-21: 09:00:00 via INTRAVENOUS

## 2017-10-21 MED ORDER — SODIUM CHLORIDE 0.9 % WEIGHT BASED INFUSION: 1.0000 mL/kg/h | INTRAVENOUS | Status: AC

## 2017-10-21 MED ORDER — HEPARIN (PORCINE) IN NACL 2-0.9 UNIT/ML-% IJ SOLN
INTRAMUSCULAR | Status: AC | PRN
  Administered 2017-10-21: 1000 mL via INTRA_ARTERIAL

## 2017-10-21 MED ORDER — IODIXANOL 320 MG/ML IV SOLN
INTRAVENOUS | Status: DC | PRN
  Administered 2017-10-21: 155 mL via INTRA_ARTERIAL

## 2017-10-21 MED ORDER — ACETAMINOPHEN 325 MG PO TABS: 650.0000 mg | ORAL_TABLET | ORAL | Status: DC | PRN

## 2017-10-21 MED ORDER — SODIUM CHLORIDE 0.9% FLUSH
3.0000 mL | Freq: Two times a day (BID) | INTRAVENOUS | Status: DC
  Administered 2017-10-21: 3 mL via INTRAVENOUS

## 2017-10-21 SURGICAL SUPPLY — 20 items
BALLN ADMIRAL INPACT 5X250 (BALLOONS) ×3
BALLOON ADMIRAL INPACT 5X250 (BALLOONS) ×2 IMPLANT
CATH MUSTANG 4X200X135 (BALLOONS) ×3 IMPLANT
CATH OMNI FLUSH 5F 65CM (CATHETERS) ×3 IMPLANT
CATH QUICKCROSS SUPP .035X90CM (MICROCATHETER) ×3 IMPLANT
COVER PRB 48X5XTLSCP FOLD TPE (BAG) ×2 IMPLANT
COVER PROBE 5X48 (BAG) ×1
DEVICE CLOSURE MYNXGRIP 6/7F (Vascular Products) ×3 IMPLANT
GLIDEWIRE ANGLED SS 035X260CM (WIRE) ×3 IMPLANT
KIT ENCORE 26 ADVANTAGE (KITS) ×3 IMPLANT
KIT MICROPUNCTURE NIT STIFF (SHEATH) ×3 IMPLANT
KIT PV (KITS) ×3 IMPLANT
SHEATH AVANTI 11CM 5FR (SHEATH) ×3 IMPLANT
SHEATH AVANTI 11CM 7FR (SHEATH) ×3 IMPLANT
SHEATH HIGHFLEX ANSEL 7FR 55CM (SHEATH) ×3 IMPLANT
STENT INNOVA 5X150X130 (Permanent Stent) ×3 IMPLANT
SYR MEDRAD MARK V 150ML (SYRINGE) ×3 IMPLANT
TRANSDUCER W/STOPCOCK (MISCELLANEOUS) ×3 IMPLANT
TRAY PV CATH (CUSTOM PROCEDURE TRAY) ×3 IMPLANT
WIRE BENTSON .035X145CM (WIRE) ×3 IMPLANT

## 2017-10-21 NOTE — Progress Notes (Signed)
Purewick to low wall suction draining yellow uop.

## 2017-10-21 NOTE — Progress Notes (Signed)
Manual pressure held to rt groin for 15 minutes w/softening of hematoma that is above dressing.

## 2017-10-21 NOTE — H&P (Signed)
Subjective:     Logan Bean is a 84 y.o. male former smoker he also has history of hypertension diabetes and hypercholesterolemia as risk factors for vascular disease.  He also has varicose veins of bilateral lower extremities has had venous reflux study in the past.  He is now seen in the wound care center for ulcerations on his left ankle and foot mainly on his heel.  He has not had any cellulitis fevers or chills.  He does continue to walk with the help of a walker and lives alone with the help of a nearby caregiver.      Past Medical History:  Diagnosis Date  . Arthritis    left knee  . Diabetes mellitus    diet controlled, pt does not check cbg  . Diaphragmatic hernia without mention of obstruction or gangrene   . Dizziness and giddiness   . Edema   . H/O hiatal hernia   . Hypercholesteremia   . Hypertension   . Left rotator cuff tear Jul 12, 2012  . Stomach ulcer    years  . Unspecified hereditary and idiopathic peripheral neuropathy   . Unspecified vitamin D deficiency   . Varicose veins    both legs        Family History  Problem Relation Age of Onset  . Parkinson's disease Mother   . Hip fracture Mother   . Heart disease Mother   . Alzheimer's disease Mother   . Heart disease Father   . Parkinson's disease Sister   . Heart disease Brother         Past Surgical History:  Procedure Laterality Date  . CARDIOVERSION N/A 02/22/2013   Procedure: CARDIOVERSION;  Surgeon: Darlin Coco, MD;  Location: [facility] ENDOSCOPY;  Service: Cardiovascular;  Laterality: N/A;  . Esopagus strectching  2003 and 3 yrs ago  . MASTECTOMY     bilateral  . ORIF ANKLE FRACTURE Right 09/20/2012   Procedure: OPEN REDUCTION INTERNAL FIXATION (ORIF) ANKLE FRACTURE;  Surgeon: Johnn Hai, MD;  Location: [facility] ORS;  Service: Orthopedics;  Laterality: Right;  . REVERSE SHOULDER ARTHROPLASTY Right 09/17/2012   Procedure: REVERSE SHOULDER ARTHROPLASTY;   Surgeon: Augustin Schooling, MD;  Location: [place];  Service: Orthopedics;  Laterality: Right;  . SHOULDER OPEN ROTATOR CUFF REPAIR  11/05/2011   Procedure: ROTATOR CUFF REPAIR SHOULDER OPEN;  Surgeon: Tobi Bastos, MD;  Location: [facility] ORS;  Service: Orthopedics;  Laterality: Left;  Left Shoulder Rotator Cuff Repair Open with Graft and Anchors  . TONSILLECTOMY  age 4    Short Social History:  Social History   Tobacco Use  . Smoking status: Former Smoker    Years: 50.00    Types: Pipe, Cigarettes    Last attempt to quit: 07/14/1996    Years since quitting: 21.1  . Smokeless tobacco: Never Used  Substance Use Topics  . Alcohol use: Yes    Comment: occasional beer        Allergies  Allergen Reactions  . Morphine And Related Nausea And Vomiting  . Oxycodone Nausea And Vomiting          Current Outpatient Medications  Medication Sig Dispense Refill  . acetaminophen (TYLENOL) 325 MG tablet Take 650 mg by mouth every 6 (six) hours as needed.    . Calcium Carbonate-Vitamin D 600-200 MG-UNIT TABS Take 1 tablet by mouth 2 (two) times daily. Take 1 tablet daily for vitamin supplement.    Marland Kitchen ELIQUIS 2.5  MG TABS tablet TAKE 1 TABLET BY MOUTH TWICE DAILY. 180 tablet 1  . ferrous sulfate 325 (65 FE) MG tablet Take 325 mg by mouth 2 (two) times a week. Takes one tablet by mouth every three days     . furosemide (LASIX) 20 MG tablet TAKE (1/2) TABLET ONCE A DAY FOR EDEMA, HYPERTENSION. 45 tablet 3  . Glucosamine-Chondroit-Vit C-Mn (GLUCOSAMINE 1500 COMPLEX) CAPS Take one tablet once a day 30 each 0  . glucose blood (TRUETEST TEST) test strip E11.22 check blood sugar once daily 50 each PRN  . lisinopril (PRINIVIL,ZESTRIL) 10 MG tablet TAKE 1 TABLET BY MOUTH DAILY. 90 tablet 1  . metFORMIN (GLUCOPHAGE) 500 MG tablet TAKE 1 TABLET BY MOUTH TWICE DAILY WITH A MEAL. 180 tablet 1  . metoprolol tartrate (LOPRESSOR) 25 MG tablet TAKE 1 TABLET BY MOUTH TWICE DAILY. 180 tablet 1  .  Multiple Vitamin (MULITIVITAMIN WITH MINERALS) TABS Take 1 tablet by mouth daily.     . Omega-3 Fatty Acids (FISH OIL) 1000 MG CAPS Take 1,000 mg by mouth daily.     . polyvinyl alcohol (LIQUIFILM TEARS) 1.4 % ophthalmic solution Place 1 drop into both eyes as needed for dry eyes.    . simvastatin (ZOCOR) 10 MG tablet TAKE ONE TABLET AT BEDTIME. 90 tablet 1  . TRUEPLUS LANCETS 30G MISC Use to check blood sugar once daily. Dx E11.9 50 each 12   No current facility-administered medications for this visit.     Review of Systems  Constitutional:  Constitutional negative. Eyes: Eyes negative.  Respiratory: Positive for shortness of breath.  Cardiovascular: Positive for dyspnea with exertion and leg swelling.  GI: Gastrointestinal negative.  Musculoskeletal: Positive for leg pain.  Skin: Positive for wound.  Neurological: Neurological negative. Hematologic: Hematologic/lymphatic negative.  Psychiatric: Psychiatric negative.        Objective:  Objective      Vitals:   09/11/17 1355  BP: 121/64  Pulse: 71  Resp: 16  SpO2: 90%  Weight: 125 lb (56.7 kg)  Height: 5\' 4"  (1.626 m)   Body mass index is 21.46 kg/m.  Physical Exam  Constitutional: He is oriented to person, place, and time.  HENT:  Head: Normocephalic.  Neck: Normal range of motion. Neck supple.  Cardiovascular: Normal rate.  Pulses:      Femoral pulses are 2+ on the right side, and 2+ on the left side. Left peroneal signal  Pulmonary/Chest: Effort normal.  Abdominal: Soft. He exhibits no mass.  Musculoskeletal: He exhibits edema.  Lymphadenopathy:    He has no cervical adenopathy.  Neurological: He is alert and oriented to person, place, and time.  Psychiatric: He has a normal mood and affect. His behavior is normal. Judgment and thought content normal.  Skin      Data:      Assessment/Plan:   84 year old male with mixed venous and arterial ulceration on his left leg.  We will plan  for aortogram left lower extremity angiogram today.  Annaleese Guier C. Donzetta Matters, MD [facility] Office: [phone] Pager: [phone]

## 2017-10-21 NOTE — Op Note (Signed)
    Patient name: Logan Bean MRN: [id number] DOB: 16-Mar-1921 Sex: male  10/21/2017 Pre-operative Diagnosis: Critical left lower extremity ischemia with wounds Post-operative diagnosis:  Same Surgeon:  Erlene Quan C. Donzetta Matters, MD Procedure Performed: 1.  Ultrasound-guided cannulation right common femoral artery 2.  Aortogram bilateral lower extremity runoff 3.  Drug-coated balloon angioplasty left SFA with 5 mm x 250 mm balloon in pact 4.  Stent of left SFA with 5 x 164mm Innova 5.  Percutaneous closure of right common femoral artery with minx  Indications: 84 year old male with history of ulceration of his left leg that is likely mixed venous and arterial in nature.  He is now indicated for aortogram bilateral lower extremity runoff with possible intervention on the left  Findings: Aorta and iliac segments appear free of disease.  His left SFA has multiple 50% stenosis followed by occlusion distally.  He appears to have two-vessel runoff via the anterior tibial and posterior tibial arteries.  I completion he has run off all the way to the foot there is strong signals at the posterior tibial and dorsalis pedis that are traced onto the foot.  On the right side his SFA does have multi focal disease is tibials are incompletely visualized given that he moved during angiogram.   Procedure:  The patient was identified in the holding area and taken to room 8.  The patient was then placed supine on the table and prepped and draped in the usual sterile fashion.  A time out was called.  Ultrasound was used to evaluate the right common femoral artery.  It was patent .  A digital ultrasound image was acquired.  A micropuncture needle was used to access the right common femoral artery under ultrasound guidance.  An 018 wire was advanced without resistance and a micropuncture sheath was placed.  The 018 wire was removed and a benson wire was placed.  The micropuncture sheath was exchanged for a 5 french sheath.  An  omniflush catheter was advanced over the wire to the level of L-1.  An abdominal angiogram was obtained followed by bilateral lower extremity runoff with the above findings.  Satisfied with this we went up and over the bifurcation with Omni and Bentson wire and placed a long 7 French sheath and the patient was heparinized.  We used quick cross catheter and Glidewire to cross the occluded SFA and then perform 4 mm balloon angioplasty.  Following this there was now a channel we performed long drug-coated balloon angioplasty from which there was initially a dissection.  We attempted to tack this dissection but residual dissection remained.  We then elected to place a stent.  A 5 x 150 in over a stent was then placed and postdilated with a 4 mm balloon.  Joetta Manners now demonstrated runoff was no residual dissection or stenosis and there was two-vessel runoff to the foot and we had strong signals on to the foot.  We exchanged for a short 7 French sheath and a minx device was used to close the vessel and pressure held for 1 minute.  He tolerated procedure without immediate complication.  Contrast: 155 cc   Debi Cousin C. Donzetta Matters, MD [facility] [place] Office: [phone] Pager: [phone]

## 2017-10-22 ENCOUNTER — Encounter (HOSPITAL_COMMUNITY): Payer: Self-pay | Admitting: Vascular Surgery

## 2017-10-22 DIAGNOSIS — L97421 Non-pressure chronic ulcer of left heel and midfoot limited to breakdown of skin: Secondary | ICD-10-CM | POA: Diagnosis not present

## 2017-10-22 DIAGNOSIS — I129 Hypertensive chronic kidney disease with stage 1 through stage 4 chronic kidney disease, or unspecified chronic kidney disease: Secondary | ICD-10-CM | POA: Diagnosis not present

## 2017-10-22 DIAGNOSIS — E11621 Type 2 diabetes mellitus with foot ulcer: Secondary | ICD-10-CM | POA: Diagnosis not present

## 2017-10-22 DIAGNOSIS — I998 Other disorder of circulatory system: Secondary | ICD-10-CM | POA: Diagnosis not present

## 2017-10-22 DIAGNOSIS — N183 Chronic kidney disease, stage 3 (moderate): Secondary | ICD-10-CM | POA: Diagnosis not present

## 2017-10-22 DIAGNOSIS — E1122 Type 2 diabetes mellitus with diabetic chronic kidney disease: Secondary | ICD-10-CM | POA: Diagnosis not present

## 2017-10-22 LAB — GLUCOSE, CAPILLARY: GLUCOSE-CAPILLARY: 157 mg/dL — AB (ref 65–99)

## 2017-10-22 LAB — CBC
HCT: 30.3 % — ABNORMAL LOW (ref 39.0–52.0)
Hemoglobin: 9.3 g/dL — ABNORMAL LOW (ref 13.0–17.0)
MCH: 23.9 pg — AB (ref 26.0–34.0)
MCHC: 30.7 g/dL (ref 30.0–36.0)
MCV: 77.9 fL — AB (ref 78.0–100.0)
PLATELETS: 228 10*3/uL (ref 150–400)
RBC: 3.89 MIL/uL — AB (ref 4.22–5.81)
RDW: 16.9 % — AB (ref 11.5–15.5)
WBC: 7.8 10*3/uL (ref 4.0–10.5)

## 2017-10-22 LAB — BASIC METABOLIC PANEL
Anion gap: 7 (ref 5–15)
BUN: 25 mg/dL — AB (ref 6–20)
CHLORIDE: 106 mmol/L (ref 101–111)
CO2: 25 mmol/L (ref 22–32)
Calcium: 8.7 mg/dL — ABNORMAL LOW (ref 8.9–10.3)
Creatinine, Ser: 0.83 mg/dL (ref 0.61–1.24)
GFR calc Af Amer: >60 mL/min (ref >60)
GFR calc non Af Amer: >60 mL/min (ref >60)
Glucose, Bld: 140 mg/dL — ABNORMAL HIGH (ref 65–99)
POTASSIUM: 4.7 mmol/L (ref 3.5–5.1)
SODIUM: 138 mmol/L (ref 135–145)

## 2017-10-22 MED ORDER — CLOPIDOGREL BISULFATE 75 MG PO TABS: 75.0000 mg | ORAL_TABLET | Freq: Every day | ORAL | 0 refills | Status: DC

## 2017-10-22 MED FILL — Heparin Sodium (Porcine) 2 Unit/ML in Sodium Chloride 0.9%: INTRAMUSCULAR | Qty: 1000 | Status: AC

## 2017-10-22 MED FILL — Lidocaine HCl Local Inj 1%: INTRAMUSCULAR | Qty: 20 | Status: AC

## 2017-10-22 NOTE — Care Management Obs Status (Signed)
[employer] NOTIFICATION   Patient Details  Name: Logan Bean MRN: [id number] Date of Birth: May 16, 1921   Medicare Observation Status Notification Given:  Yes    Bethena Roys, RN 10/22/2017, 11:05 AM

## 2017-10-22 NOTE — Discharge Instructions (Addendum)
Information on my medicine - ELIQUIS (apixaban)  This medication education was reviewed with me or my healthcare representative as part of my discharge preparation.  The pharmacist that spoke with me during my hospital stay was:  Saundra Shelling, [facility]  Why was Eliquis prescribed for you? Eliquis was prescribed for you to reduce the risk of a blood clot forming that can cause a stroke if you have a medical condition called atrial fibrillation (a type of irregular heartbeat).  What do You need to know about Eliquis ? Take your Eliquis TWICE DAILY - one tablet in the morning and one tablet in the evening with or without food. If you have difficulty swallowing the tablet whole please discuss with your pharmacist how to take the medication safely.  Take Eliquis exactly as prescribed by your doctor and DO NOT stop taking Eliquis without talking to the doctor who prescribed the medication.  Stopping may increase your risk of developing a stroke.  Refill your prescription before you run out.  After discharge, you should have regular check-up appointments with your healthcare provider that is prescribing your Eliquis.  In the future your dose may need to be changed if your kidney function or weight changes by a significant amount or as you get older.  What do you do if you miss a dose? If you miss a dose, take it as soon as you remember on the same day and resume taking twice daily.  Do not take more than one dose of ELIQUIS at the same time to make up a missed dose.  Important Safety Information A possible side effect of Eliquis is bleeding. You should call your healthcare provider right away if you experience any of the following: ? Bleeding from an injury or your nose that does not stop. ? Unusual colored urine (red or dark brown) or unusual colored stools (red or black). ? Unusual bruising for unknown reasons. ? A serious fall or if you hit your head (even if there is no bleeding).  Some  medicines may interact with Eliquis and might increase your risk of bleeding or clotting while on Eliquis. To help avoid this, consult your healthcare provider or pharmacist prior to using any new prescription or non-prescription medications, including herbals, vitamins, non-steroidal anti-inflammatory drugs (NSAIDs) and supplements.  This website has more information on Eliquis (apixaban): http://www.eliquis.com/eliquis/home    [facility]  Discharge Instructions  Lower Extremity Angiogram; Angioplasty/Stenting  Please refer to the following instructions for your post-procedure care. Your surgeon or physician assistant will discuss any changes with you.  Activity  Avoid lifting more than 8 pounds (1 gallons of milk) for 72 hours (3 days) after your procedure. You may walk as much as you can tolerate. It's OK to drive after 72 hours.  Bathing/Showering  You may shower the day after your procedure. If you have a bandage, you may remove it at 24- 48 hours. Clean your incision site with mild soap and water. Pat the area dry with a clean towel.  Diet  Resume your pre-procedure diet. There are no special food restrictions following this procedure. All patients with peripheral vascular disease should follow a low fat/low cholesterol diet. In order to heal from your surgery, it is CRITICAL to get adequate nutrition. Your body requires vitamins, minerals, and protein. Vegetables are the best source of vitamins and minerals. Vegetables also provide the perfect balance of protein. Processed food has little nutritional value, so try to avoid this.  Medications  Resume taking all of your medications unless your doctor tells you not to. If your incision is causing pain, you may take over-the-counter pain relievers such as acetaminophen (Tylenol)  DO NOT RESTART YOUR METFORMIN UNTIL 10/24/17 BECAUSE YOU HAD IV CONTRAST AND WILL INTERACT WITH YOUR KIDNEYS.    YOU  MAY RESTART YOUR METFORMIN ON 10/24/17.  Follow Up  Follow up will be arranged at the time of your procedure. You may have an office visit scheduled or may be scheduled for surgery. Ask your surgeon if you have any questions.  Please call us immediately for any of the following conditions: Severe or worsening pain your legs or feet at rest or with walking. Increased pain, redness, drainage at your groin puncture site. Fever of 101 degrees or higher. If you have any mild or slow bleeding from your puncture site: lie down, apply firm constant pressure over the area with a piece of gauze or a clean wash cloth for 30 minutes- no peeking!, call 911 right away if you are still bleeding after 30 minutes, or if the bleeding is heavy and unmanageable.  Reduce your risk factors of vascular disease:   Stop smoking. If you would like help call [facility] at 1-800-QUIT-NOW 431 119 5235) or [place] at (502)040-0883.  Manage your cholesterol  Maintain a desired weight  Control your diabetes  Keep your blood pressure down   If you have any questions, please call the office at [phone]

## 2017-10-22 NOTE — Progress Notes (Signed)
Responded to scc to assist patient with AD.  After speaking with patient's nurse, I was informed that patient already have AD. Will follow as needed. Jaclynn Major, [place], [facility], Pager [phone]

## 2017-10-22 NOTE — Care Management Note (Signed)
Case Management Note  Patient Details  Name: Logan Bean MRN: [id number] Date of Birth: 1921/06/09  Subjective/Objective:  Pt presented for PAD- hx of ulceration of left leg. PTA from home with family support. [facility] 2 days a week 3 hours per day. Pt is active with [facility] for dressing changes. No new orders needed.                   Action/Plan: CM did make the Liaison Sharyn Lull aware that pt is plan to transition home today. No further needs from CM at this time.  Expected Discharge Date:  10/22/17               Expected Discharge Plan:  [place]  In-House Referral:  NA  Discharge planning Services  CM Consult  Post Acute Care Choice:  Home Health, Resumption of Svcs/PTA Provider Choice offered to:  Patient  DME Arranged:  N/A DME Agency:  NA  HH Arranged:  RN [place] Agency:  [facility]  Status of Service:  Completed, signed off  If discussed at [place] of Stay Meetings, dates discussed:    Additional Comments:  Bethena Roys, RN 10/22/2017, 11:01 AM

## 2017-10-22 NOTE — Progress Notes (Signed)
  Progress Note    10/22/2017 9:36 AM 1 Day Post-Op  Subjective: Feeling fine this morning  Vitals:   10/22/17 0500 10/22/17 0733  BP: (!) 137/40 (!) 97/53  Pulse: 86 89  Resp: 20 18  Temp: 98.3 F (36.8 C) 97.8 F (36.6 C)  SpO2: 97% 99%    Physical Exam: Awake alert and oriented Nonlabored respirations Abdomen is soft nontender Right groin soft without hematoma Left foot is warm with good cap refill the dressing on the left leg is clean dry and intact  CBC    Component Value Date/Time   WBC 7.8 10/22/2017 0356   RBC 3.89 (L) 10/22/2017 0356   HGB 9.3 (L) 10/22/2017 0356   HGB 12.4 (L) 07/27/2015 0952   HCT 30.3 (L) 10/22/2017 0356   HCT 39.2 07/27/2015 0952   PLT 228 10/22/2017 0356   PLT 235 07/27/2015 0952   MCV 77.9 (L) 10/22/2017 0356   MCV 80 07/27/2015 0952   MCH 23.9 (L) 10/22/2017 0356   MCHC 30.7 10/22/2017 0356   RDW 16.9 (H) 10/22/2017 0356   RDW 16.6 (H) 07/27/2015 0952   LYMPHSABS 0.9 10/01/2017 1156   LYMPHSABS 1.3 07/27/2015 0952   MONOABS 0.6 10/01/2017 1156   EOSABS 0.1 10/01/2017 1156   EOSABS 0.2 07/27/2015 0952   BASOSABS 0.0 10/01/2017 1156   BASOSABS 0.0 07/27/2015 0952    BMET    Component Value Date/Time   NA 138 10/22/2017 0356   NA 143 07/27/2015 0952   K 4.7 10/22/2017 0356   CL 106 10/22/2017 0356   CO2 25 10/22/2017 0356   GLUCOSE 140 (H) 10/22/2017 0356   BUN 25 (H) 10/22/2017 0356   BUN 29 07/27/2015 0952   CREATININE 0.83 10/22/2017 0356   CREATININE 0.85 08/05/2017 1626   CALCIUM 8.7 (L) 10/22/2017 0356   GFRNONAA >60 10/22/2017 0356   GFRNONAA 75 08/05/2017 1626   GFRAA >60 10/22/2017 0356   GFRAA 86 08/05/2017 1626    INR No results found for: INR   Intake/Output Summary (Last 24 hours) at 10/22/2017 0936 Last data filed at 10/22/2017 0700 Gross per 24 hour  Intake 1173.62 ml  Output 1000 ml  Net 173.62 ml     Assessment:  84 y.o. male is s/p  Stenting of left SFA following drug-coated balloon  angioplasty.  This was done for residual dissection.  His dominant runoff was via the posterior tibial artery.  Plan: Discharge today Plavix for at least 30 days He can resume his Eliquis If he has bleeding issues we will consider stopping his Plavix Follow-up in 4-6 weeks with left lower extremity duplex and ABI.   Francoise Chojnowski C. Donzetta Matters, MD [facility] [place] Office: [phone] Pager: [phone]  10/22/2017 9:36 AM

## 2017-10-22 NOTE — Discharge Summary (Signed)
Discharge Summary    Logan Bean [birth year] 84 y.o. male  [id number]  Admission Date: 10/21/2017  Discharge Date: 10/22/2017   Physician: Thomes Lolling*  Admission Diagnosis: PAD (peripheral artery disease) ([place]) [I73.9]   HPI:   This is a 84 y.o. male  with history of ulceration of his left leg that is likely mixed venous and arterial in nature. He is now indicated for aortogram bilateral lower extremity runoff with possible intervention on the left.    Hospital Course:  The patient was admitted to the hospital and taken to the operating room on 10/21/2017 and underwent:  1.  Ultrasound-guided cannulation right common femoral artery 2.  Aortogram bilateral lower extremity runoff 3.  Drug-coated balloon angioplasty left SFA with 5 mm x 250 mm balloon in pact 4.  Stent of left SFA with 5 x 183mm Innova 5.  Percutaneous closure of right common femoral artery with minx  Findings: Aorta and iliac segments appear free of disease.  His left SFA has multiple 50% stenosis followed by occlusion distally.  He appears to have two-vessel runoff via the anterior tibial and posterior tibial arteries.  I completion he has run off all the way to the foot there is strong signals at the posterior tibial and dorsalis pedis that are traced onto the foot.  On the right side his SFA does have multi focal disease is tibials are incompletely visualized given that he moved during angiogram.   The pt tolerated the procedure well and was transported to the PACU in good condition.   On post op day 1 patient was doing well. He will be started on plavix on 30 days and then discontinued. He can resume his Eliquis If he has bleeding issues we will consider stopping his Plavix Follow-up in 4-6 weeks with left lower extremity duplex and ABI.    The remainder of the hospital course consisted of increasing mobilization and increasing intake of solids without difficulty.  CBC    Component  Value Date/Time   WBC 7.8 10/22/2017 0356   RBC 3.89 (L) 10/22/2017 0356   HGB 9.3 (L) 10/22/2017 0356   HGB 12.4 (L) 07/27/2015 0952   HCT 30.3 (L) 10/22/2017 0356   HCT 39.2 07/27/2015 0952   PLT 228 10/22/2017 0356   PLT 235 07/27/2015 0952   MCV 77.9 (L) 10/22/2017 0356   MCV 80 07/27/2015 0952   MCH 23.9 (L) 10/22/2017 0356   MCHC 30.7 10/22/2017 0356   RDW 16.9 (H) 10/22/2017 0356   RDW 16.6 (H) 07/27/2015 0952   LYMPHSABS 0.9 10/01/2017 1156   LYMPHSABS 1.3 07/27/2015 0952   MONOABS 0.6 10/01/2017 1156   EOSABS 0.1 10/01/2017 1156   EOSABS 0.2 07/27/2015 0952   BASOSABS 0.0 10/01/2017 1156   BASOSABS 0.0 07/27/2015 0952    BMET    Component Value Date/Time   NA 138 10/22/2017 0356   NA 143 07/27/2015 0952   K 4.7 10/22/2017 0356   CL 106 10/22/2017 0356   CO2 25 10/22/2017 0356   GLUCOSE 140 (H) 10/22/2017 0356   BUN 25 (H) 10/22/2017 0356   BUN 29 07/27/2015 0952   CREATININE 0.83 10/22/2017 0356   CREATININE 0.85 08/05/2017 1626   CALCIUM 8.7 (L) 10/22/2017 0356   GFRNONAA >60 10/22/2017 0356   GFRNONAA 75 08/05/2017 1626   GFRAA >60 10/22/2017 0356   GFRAA 86 08/05/2017 1626      Discharge Instructions    Discharge patient   Complete by:  As directed  Please tell pt not to take Metformin until 10/24/17 due to having dye for his procedure   Discharge disposition:  01-Home or Self Care   Discharge patient date:  10/22/2017      Discharge Diagnosis:  PAD (peripheral artery disease) ([place]) [I73.9]  Secondary Diagnosis: Patient Active Problem List   Diagnosis Date Noted  . PAD (peripheral artery disease) ([place]) 10/21/2017  . Iron deficiency anemia secondary to inadequate dietary iron intake 08/19/2016  . Other constipation 08/19/2016  . Capillary vascular disease 11/21/2015  . Venous ulcer of ankle ([place]) 11/02/2015  . Near syncope 04/27/2015  . Type 2 diabetes mellitus with hyperglycemia ([place]) 04/27/2015  . Contusion of multiple sites 04/26/2015    . Type 2 diabetes mellitus with diabetic chronic kidney disease ([place]) 07/11/2014  . Colon cancer screening 07/11/2014  . Hygroma 05/19/2014  . Atrial fibrillation with RVR ([place]) 05/18/2014  . Laceration of skin of face 05/18/2014  . Fall   . Venous stasis dermatitis 05/16/2014  . Carpal tunnel syndrome 05/10/2014  . CKD (chronic kidney disease) stage 3, GFR 30-59 ml/min (HCC) 10/11/2013  . Restless leg syndrome 05/25/2013  . Hyperthyroidism, subclinical 03/02/2013  . Chronic anticoagulation-Eliquis 01/31/2013  . Chronic atrial fibrillation ([place]) 01/21/2013  . Sweating increase 01/19/2013  . CHF (congestive heart failure) ([place]) 01/19/2013  . Pleural effusion 01/19/2013  . Multinodular goiter 01/19/2013  . Dyspnea 01/15/2013  . Edema 01/15/2013  . Hypokalemia 01/15/2013  . Rash and nonspecific skin eruption 01/15/2013  . Dyslipidemia 12/29/2012  . GERD (gastroesophageal reflux disease) 12/29/2012  . Loss of weight 12/29/2012  . Closed bimalleolar fracture of right ankle 09/28/2012  . Diarrhea 09/22/2012  . Essential hypertension 09/21/2012  . Complete tear of left rotator cuff 11/05/2011   Past Medical History:  Diagnosis Date  . Anxiety   . Arthritis    "probably all over" (10/21/2017)  . Depression   . Diaphragmatic hernia without mention of obstruction or gangrene   . Dizziness and giddiness   . Edema   . GERD (gastroesophageal reflux disease)   . H/O hiatal hernia   . History of gout   . History of kidney stones   . History of stomach ulcers   . Hypercholesteremia   . Hypertension   . Left rotator cuff tear Jul 12, 2012  . PVD (peripheral vascular disease) ([place])   . Stomach ulcer    years  . Type II diabetes mellitus ([place])   . Unspecified hereditary and idiopathic peripheral neuropathy   . Unspecified vitamin D deficiency   . Varicose veins    both legs     Allergies as of 10/22/2017      Reactions   Morphine And Related Nausea And Vomiting   Oxycodone  Nausea And Vomiting      Medication List    STOP taking these medications   doxycycline 100 MG capsule Commonly known as:  VIBRAMYCIN     TAKE these medications   acetaminophen 500 MG tablet Commonly known as:  TYLENOL Take 650 mg by mouth every 6 (six) hours as needed.   Calcium Carbonate-Vitamin D 600-200 MG-UNIT Tabs Take 1 tablet by mouth 2 (two) times daily. Take 1 tablet daily for vitamin supplement.   clopidogrel 75 MG tablet Commonly known as:  PLAVIX Take 1 tablet (75 mg total) by mouth daily with breakfast.   ELIQUIS 2.5 MG Tabs tablet Generic drug:  apixaban TAKE 1 TABLET BY MOUTH TWICE DAILY.   ferrous sulfate 325 (65 FE) MG  tablet Take 325 mg by mouth 2 (two) times a week. Takes one tablet by mouth every three days   Fish Oil 1000 MG Caps Take 1,000 mg by mouth daily.   furosemide 20 MG tablet Commonly known as:  LASIX TAKE (1/2) TABLET ONCE A DAY FOR EDEMA, HYPERTENSION.   GLUCOSAMINE 1500 COMPLEX Caps Take one tablet once a day   glucose blood test strip Commonly known as:  TRUETEST TEST Use to check blood sugar once daily Dx:E11.22   lisinopril 10 MG tablet Commonly known as:  PRINIVIL,ZESTRIL TAKE 1 TABLET BY MOUTH DAILY.   metFORMIN 500 MG tablet Commonly known as:  GLUCOPHAGE TAKE 1 TABLET BY MOUTH TWICE DAILY WITH A MEAL.   metoprolol tartrate 25 MG tablet Commonly known as:  LOPRESSOR TAKE 1 TABLET BY MOUTH TWICE DAILY.   multivitamin with minerals Tabs tablet Take 1 tablet by mouth daily.   polyvinyl alcohol 1.4 % ophthalmic solution Commonly known as:  LIQUIFILM TEARS Place 1 drop into both eyes as needed for dry eyes.   simvastatin 10 MG tablet Commonly known as:  ZOCOR TAKE ONE TABLET AT BEDTIME.   TRUEPLUS LANCETS 30G Misc Use to check blood sugar once daily. Dx E11.9       Prescriptions given: Plavix 75mg  daily #30 NR  [facility]  Discharge Instructions  Lower Extremity  Angiogram; Angioplasty/Stenting  Please refer to the following instructions for your post-procedure care. Your surgeon or physician assistant will discuss any changes with you.  Activity  Avoid lifting more than 8 pounds (1 gallons of milk) for 72 hours (3 days) after your procedure. You may walk as much as you can tolerate. It's OK to drive after 72 hours.  Bathing/Showering  You may shower the day after your procedure. If you have a bandage, you may remove it at 24- 48 hours. Clean your incision site with mild soap and water. Pat the area dry with a clean towel.  Diet  Resume your pre-procedure diet. There are no special food restrictions following this procedure. All patients with peripheral vascular disease should follow a low fat/low cholesterol diet. In order to heal from your surgery, it is CRITICAL to get adequate nutrition. Your body requires vitamins, minerals, and protein. Vegetables are the best source of vitamins and minerals. Vegetables also provide the perfect balance of protein. Processed food has little nutritional value, so try to avoid this.  Medications  Resume taking all of your medications unless your doctor tells you not to. If your incision is causing pain, you may take over-the-counter pain relievers such as acetaminophen (Tylenol)  DO NOT RESTART YOUR METFORMIN UNTIL 10/24/17 BECAUSE YOU HAD IV CONTRAST AND WILL INTERACT WITH YOUR KIDNEYS.    YOU MAY RESTART YOUR METFORMIN ON 10/24/17.  Follow Up  Follow up will be arranged at the time of your procedure. You may have an office visit scheduled or may be scheduled for surgery. Ask your surgeon if you have any questions.  Please call us immediately for any of the following conditions: .Severe or worsening pain your legs or feet at rest or with walking. .Increased pain, redness, drainage at your groin puncture site. .Fever of 101 degrees or higher. .If you have any mild or slow bleeding from your  puncture site: lie down, apply firm constant pressure over the area with a piece of gauze or a clean wash cloth for 30 minutes- no peeking!, call 911 right away if you are still bleeding after  30 minutes, or if the bleeding is heavy and unmanageable.  Reduce your risk factors of vascular disease:   Stop smoking. If you would like help call [facility] at 1-800-QUIT-NOW 437-779-7097) or [place] at [phone].  Manage your cholesterol  Maintain a desired weight  Control your diabetes  Keep your blood pressure down   If you have any questions, please call the office at [phone]  Disposition: home  Patient's condition: is Good  Follow up: 1. Dr. Donzetta Matters in 4-6 weeks with left arterial duplex and ABI  Leontine Locket, PA-C [facility] [phone] 10/22/2017  9:57 AM

## 2017-10-23 ENCOUNTER — Encounter (HOSPITAL_COMMUNITY): Payer: Self-pay | Admitting: Emergency Medicine

## 2017-10-23 ENCOUNTER — Emergency Department (HOSPITAL_COMMUNITY)
Admission: EM | Admit: 2017-10-23 | Discharge: 2017-10-23 | Disposition: A | Discharge status: Home / Self Care | Payer: Medicare Other | Source: Home / Self Care | Attending: Emergency Medicine | Admitting: Emergency Medicine

## 2017-10-23 ENCOUNTER — Encounter (HOSPITAL_BASED_OUTPATIENT_CLINIC_OR_DEPARTMENT_OTHER): Payer: Medicare Other | Attending: Internal Medicine

## 2017-10-23 DIAGNOSIS — E7800 Pure hypercholesterolemia, unspecified: Secondary | ICD-10-CM | POA: Diagnosis not present

## 2017-10-23 DIAGNOSIS — Z7902 Long term (current) use of antithrombotics/antiplatelets: Secondary | ICD-10-CM | POA: Insufficient documentation

## 2017-10-23 DIAGNOSIS — Z9013 Acquired absence of bilateral breasts and nipples: Secondary | ICD-10-CM

## 2017-10-23 DIAGNOSIS — I509 Heart failure, unspecified: Secondary | ICD-10-CM | POA: Insufficient documentation

## 2017-10-23 DIAGNOSIS — N183 Chronic kidney disease, stage 3 (moderate): Secondary | ICD-10-CM | POA: Insufficient documentation

## 2017-10-23 DIAGNOSIS — Z7901 Long term (current) use of anticoagulants: Secondary | ICD-10-CM | POA: Insufficient documentation

## 2017-10-23 DIAGNOSIS — I130 Hypertensive heart and chronic kidney disease with heart failure and stage 1 through stage 4 chronic kidney disease, or unspecified chronic kidney disease: Secondary | ICD-10-CM

## 2017-10-23 DIAGNOSIS — Z79899 Other long term (current) drug therapy: Secondary | ICD-10-CM | POA: Insufficient documentation

## 2017-10-23 DIAGNOSIS — L97329 Non-pressure chronic ulcer of left ankle with unspecified severity: Secondary | ICD-10-CM | POA: Diagnosis not present

## 2017-10-23 DIAGNOSIS — T827XXA Infection and inflammatory reaction due to other cardiac and vascular devices, implants and grafts, initial encounter: Secondary | ICD-10-CM | POA: Diagnosis not present

## 2017-10-23 DIAGNOSIS — Z7984 Long term (current) use of oral hypoglycemic drugs: Secondary | ICD-10-CM | POA: Insufficient documentation

## 2017-10-23 DIAGNOSIS — E1122 Type 2 diabetes mellitus with diabetic chronic kidney disease: Secondary | ICD-10-CM

## 2017-10-23 DIAGNOSIS — L97422 Non-pressure chronic ulcer of left heel and midfoot with fat layer exposed: Secondary | ICD-10-CM | POA: Insufficient documentation

## 2017-10-23 DIAGNOSIS — Z87891 Personal history of nicotine dependence: Secondary | ICD-10-CM | POA: Insufficient documentation

## 2017-10-23 DIAGNOSIS — I482 Chronic atrial fibrillation: Secondary | ICD-10-CM | POA: Diagnosis not present

## 2017-10-23 DIAGNOSIS — I872 Venous insufficiency (chronic) (peripheral): Secondary | ICD-10-CM | POA: Insufficient documentation

## 2017-10-23 DIAGNOSIS — E11622 Type 2 diabetes mellitus with other skin ulcer: Secondary | ICD-10-CM | POA: Insufficient documentation

## 2017-10-23 DIAGNOSIS — L97822 Non-pressure chronic ulcer of other part of left lower leg with fat layer exposed: Secondary | ICD-10-CM

## 2017-10-23 DIAGNOSIS — R509 Fever, unspecified: Secondary | ICD-10-CM | POA: Diagnosis not present

## 2017-10-23 DIAGNOSIS — L97222 Non-pressure chronic ulcer of left calf with fat layer exposed: Secondary | ICD-10-CM | POA: Insufficient documentation

## 2017-10-23 DIAGNOSIS — L97322 Non-pressure chronic ulcer of left ankle with fat layer exposed: Secondary | ICD-10-CM | POA: Insufficient documentation

## 2017-10-23 DIAGNOSIS — L03116 Cellulitis of left lower limb: Secondary | ICD-10-CM

## 2017-10-23 DIAGNOSIS — E11621 Type 2 diabetes mellitus with foot ulcer: Secondary | ICD-10-CM | POA: Insufficient documentation

## 2017-10-23 DIAGNOSIS — L97929 Non-pressure chronic ulcer of unspecified part of left lower leg with unspecified severity: Secondary | ICD-10-CM | POA: Diagnosis not present

## 2017-10-23 LAB — BASIC METABOLIC PANEL
ANION GAP: 10 (ref 5–15)
BUN: 35 mg/dL — ABNORMAL HIGH (ref 6–20)
CO2: 23 mmol/L (ref 22–32)
Calcium: 8.8 mg/dL — ABNORMAL LOW (ref 8.9–10.3)
Chloride: 105 mmol/L (ref 101–111)
Creatinine, Ser: 1.1 mg/dL (ref 0.61–1.24)
GFR calc Af Amer: >60 mL/min (ref >60)
GFR, EST NON AFRICAN AMERICAN: 55 mL/min — AB (ref >60)
GLUCOSE: 161 mg/dL — AB (ref 65–99)
Potassium: 4.4 mmol/L (ref 3.5–5.1)
SODIUM: 138 mmol/L (ref 135–145)

## 2017-10-23 LAB — CBC WITH DIFFERENTIAL/PLATELET
BASOS ABS: 0 10*3/uL (ref 0.0–0.1)
Basophils Relative: 0 %
Eosinophils Absolute: 0.2 10*3/uL (ref 0.0–0.7)
Eosinophils Relative: 2 %
HEMATOCRIT: 32.9 % — AB (ref 39.0–52.0)
Hemoglobin: 10 g/dL — ABNORMAL LOW (ref 13.0–17.0)
Lymphocytes Relative: 6 %
Lymphs Abs: 0.5 10*3/uL — ABNORMAL LOW (ref 0.7–4.0)
MCH: 23.8 pg — ABNORMAL LOW (ref 26.0–34.0)
MCHC: 30.4 g/dL (ref 30.0–36.0)
MCV: 78.1 fL (ref 78.0–100.0)
MONO ABS: 1 10*3/uL (ref 0.1–1.0)
Monocytes Relative: 11 %
NEUTROS ABS: 7.6 10*3/uL (ref 1.7–7.7)
Neutrophils Relative %: 81 %
Platelets: 232 10*3/uL (ref 150–400)
RBC: 4.21 MIL/uL — AB (ref 4.22–5.81)
RDW: 17.1 % — AB (ref 11.5–15.5)
WBC: 9.3 10*3/uL (ref 4.0–10.5)

## 2017-10-23 MED ORDER — CEPHALEXIN 500 MG PO CAPS: 500.0000 mg | ORAL_CAPSULE | Freq: Three times a day (TID) | ORAL | 0 refills | Status: DC

## 2017-10-23 MED ORDER — LISINOPRIL 10 MG PO TABS
10.0000 mg | ORAL_TABLET | Freq: Once | ORAL | Status: AC
  Administered 2017-10-23: 10 mg via ORAL
  Filled 2017-10-23: qty 1

## 2017-10-23 MED ORDER — CEFAZOLIN SODIUM-DEXTROSE 1-4 GM/50ML-% IV SOLN
1.0000 g | Freq: Once | INTRAVENOUS | Status: AC
  Administered 2017-10-23: 1 g via INTRAVENOUS
  Filled 2017-10-23: qty 50

## 2017-10-23 NOTE — Progress Notes (Signed)
A consult was received from an ED physician for ancef per pharmacy dosing.  The patient's profile has been reviewed for ht/wt/allergies/indication/available labs.   A one time order has been placed for ancef 1 gm.    Further antibiotics/pharmacy consults should be ordered by admitting physician if indicated.                       Thank you,  Eudelia Bunch, Pharm.D. [phone] 10/23/2017 3:17 PM

## 2017-10-23 NOTE — ED Triage Notes (Signed)
Patient presents with family stating he had procedure done to left foot on Wednesday. Wound center sent pt today believing he has now developed cellulitis in his left foot.

## 2017-10-23 NOTE — Discharge Instructions (Addendum)
Take antibiotics as directed. Please take all of your antibiotics until finished.  Keep the wounds clean and dry.  Continue bandaging them as directed.  Stop taking the Plavix and begin taking 325 mg of Aspirin per day. You may purchase this medication over the counter. This was at the direction of your vascular surgeon Dr. Donzetta Matters  Follow-up with the vascular surgeon as directed.  Follow-up with your wound clinic as directed.  Return to the emergency department for any fever, worsening pain, worsening redness or swelling, numbness/weakness or any other worsening or concerning symptoms.

## 2017-10-23 NOTE — ED Provider Notes (Signed)
[facility] DEPT Provider Note   CSN: [id number] Arrival date & time: 10/23/17  1253     History   Chief Complaint Chief Complaint  Patient presents with  . Foot Swelling    HPI Logan Bean is a 84 y.o. male past medical history of arthritis, PVD, varicose veins, hypertension who presents for evaluation of worsening left  lower extremity redness and warmth.  Patient is followed by the wound clinic where he gets his wounds checked and bandage every week.  Patient has peripheral vascular disease to the right lower extremity.  He is followed by Dr. Kasandra Knudsen (vascular).  He was recently admitted in the hospital on 10/21/17 - 10/22/17 for stent of left SFA.  Patient was discharged from the hospital yesterday.  Patient and family said that when he went home, his color of the left lower extremity had improved significantly.  They report that there was no warmth or erythema on the foot after discharge.  Patient went to his wound care doctor today for evaluation of his wound and family noticed worsening redness, warmth, particularly around his ulcers on the medial aspect of his leg and down to the dorsal aspect of his foot.  Patient denies any changes in pain, fever, difficulty breathing, chest pain.  The history is provided by the patient.    Past Medical History:  Diagnosis Date  . Anxiety   . Arthritis    "probably all over" (10/21/2017)  . Depression   . Diaphragmatic hernia without mention of obstruction or gangrene   . Dizziness and giddiness   . Edema   . GERD (gastroesophageal reflux disease)   . H/O hiatal hernia   . History of gout   . History of kidney stones   . History of stomach ulcers   . Hypercholesteremia   . Hypertension   . Left rotator cuff tear Jul 12, 2012  . PVD (peripheral vascular disease) ([place])   . Stomach ulcer    years  . Type II diabetes mellitus ([place])   . Unspecified hereditary and idiopathic peripheral neuropathy   .  Unspecified vitamin D deficiency   . Varicose veins    both legs    Patient Active Problem List   Diagnosis Date Noted  . PAD (peripheral artery disease) ([place]) 10/21/2017  . Iron deficiency anemia secondary to inadequate dietary iron intake 08/19/2016  . Other constipation 08/19/2016  . Capillary vascular disease 11/21/2015  . Venous ulcer of ankle ([place]) 11/02/2015  . Near syncope 04/27/2015  . Type 2 diabetes mellitus with hyperglycemia ([place]) 04/27/2015  . Contusion of multiple sites 04/26/2015  . Type 2 diabetes mellitus with diabetic chronic kidney disease ([place]) 07/11/2014  . Colon cancer screening 07/11/2014  . Hygroma 05/19/2014  . Atrial fibrillation with RVR ([place]) 05/18/2014  . Laceration of skin of face 05/18/2014  . Fall   . Venous stasis dermatitis 05/16/2014  . Carpal tunnel syndrome 05/10/2014  . CKD (chronic kidney disease) stage 3, GFR 30-59 ml/min (HCC) 10/11/2013  . Restless leg syndrome 05/25/2013  . Hyperthyroidism, subclinical 03/02/2013  . Chronic anticoagulation-Eliquis 01/31/2013  . Chronic atrial fibrillation ([place]) 01/21/2013  . Sweating increase 01/19/2013  . CHF (congestive heart failure) ([place]) 01/19/2013  . Pleural effusion 01/19/2013  . Multinodular goiter 01/19/2013  . Dyspnea 01/15/2013  . Edema 01/15/2013  . Hypokalemia 01/15/2013  . Rash and nonspecific skin eruption 01/15/2013  . Dyslipidemia 12/29/2012  . GERD (gastroesophageal reflux disease) 12/29/2012  . Loss of weight 12/29/2012  .  Closed bimalleolar fracture of right ankle 09/28/2012  . Diarrhea 09/22/2012  . Essential hypertension 09/21/2012  . Complete tear of left rotator cuff 11/05/2011    Past Surgical History:  Procedure Laterality Date  . ABDOMINAL AORTOGRAM W/LOWER EXTREMITY N/A 10/21/2017   Procedure: ABDOMINAL AORTOGRAM W/LOWER EXTREMITY - Left;  Surgeon: Waynetta Sandy, MD;  Location: [facility];  Service: Cardiovascular;  Laterality: N/A;  .  CARDIOVERSION N/A 02/22/2013   Procedure: CARDIOVERSION;  Surgeon: Darlin Coco, MD;  Location: [facility];  Service: Cardiovascular;  Laterality: N/A;  . CATARACT EXTRACTION W/ INTRAOCULAR LENS  IMPLANT, BILATERAL Bilateral   . ESOPHAGEAL DILATION  2003 and 3 yrs ago  . FRACTURE SURGERY    . MASTECTOMY     bilateral  . ORIF ANKLE FRACTURE Right 09/20/2012   Procedure: OPEN REDUCTION INTERNAL FIXATION (ORIF) ANKLE FRACTURE;  Surgeon: Johnn Hai, MD;  Location: [facility];  Service: Orthopedics;  Laterality: Right;  . PERIPHERAL VASCULAR INTERVENTION Left 10/21/2017  . PERIPHERAL VASCULAR INTERVENTION Left 10/21/2017   Procedure: PERIPHERAL VASCULAR INTERVENTION;  Surgeon: Waynetta Sandy, MD;  Location: [facility];  Service: Cardiovascular;  Laterality: Left;  . REVERSE SHOULDER ARTHROPLASTY Right 09/17/2012   Procedure: REVERSE SHOULDER ARTHROPLASTY;  Surgeon: Augustin Schooling, MD;  Location: [facility];  Service: Orthopedics;  Laterality: Right;  . SHOULDER OPEN ROTATOR CUFF REPAIR  11/05/2011   Procedure: ROTATOR CUFF REPAIR SHOULDER OPEN;  Surgeon: Tobi Bastos, MD;  Location: [facility];  Service: Orthopedics;  Laterality: Left;  Left Shoulder Rotator Cuff Repair Open with Graft and Anchors  . TONSILLECTOMY  age 58     OB History   None      Home Medications    Prior to Admission medications   Medication Sig Start Date End Date Taking? Authorizing Provider  acetaminophen (TYLENOL) 500 MG tablet Take 500 mg by mouth every 6 (six) hours as needed.    Yes [provider]  Calcium Carbonate-Vitamin D 600-200 MG-UNIT TABS Take 1 tablet by mouth 2 (two) times daily. Take 1 tablet daily for vitamin supplement.   Yes [provider]  clopidogrel (PLAVIX) 75 MG tablet Take 1 tablet (75 mg total) by mouth daily with breakfast. 10/22/17  Yes Rhyne, Samantha J, PA-C  diphenhydrAMINE (BENADRYL ALLERGY) 25 MG tablet Take 25 mg by mouth at bedtime as needed (rash  due to Plavix).   Yes [provider]  ELIQUIS 2.5 MG TABS tablet TAKE 1 TABLET BY MOUTH TWICE DAILY. 06/11/17  Yes Lauree Chandler, NP  ferrous sulfate 325 (65 FE) MG tablet Take 325 mg by mouth 2 (two) times a week. Takes one tablet by mouth every three days    Yes [provider]  furosemide (LASIX) 20 MG tablet TAKE (1/2) TABLET ONCE A DAY FOR EDEMA, HYPERTENSION. Patient taking differently: take 20mg  by mouth daily 12/23/16  Yes Lauree Chandler, NP  Glucosamine-Chondroit-Vit C-Mn (GLUCOSAMINE 1500 COMPLEX) CAPS Take one tablet once a day 01/12/13  Yes Bubba Camp, Mahima, MD  lisinopril (PRINIVIL,ZESTRIL) 10 MG tablet TAKE 1 TABLET BY MOUTH DAILY. 06/11/17  Yes Lauree Chandler, NP  metFORMIN (GLUCOPHAGE) 500 MG tablet TAKE 1 TABLET BY MOUTH TWICE DAILY WITH A MEAL. 06/11/17  Yes Lauree Chandler, NP  metoprolol tartrate (LOPRESSOR) 25 MG tablet TAKE 1 TABLET BY MOUTH TWICE DAILY. 06/11/17  Yes Lauree Chandler, NP  Multiple Vitamin (MULITIVITAMIN WITH MINERALS) TABS Take 1 tablet by mouth daily.  Yes [provider]  Omega-3 Fatty Acids (FISH OIL) 1000 MG CAPS Take 1,000 mg by mouth daily.    Yes [provider]  Probiotic Product (PROBIOTIC PO) Take 1 tablet by mouth daily.   Yes [provider]  simvastatin (ZOCOR) 10 MG tablet TAKE ONE TABLET AT BEDTIME. Patient taking differently: TAKE ONE TABLET (10 mg)  AT BEDTIME. 06/11/17  Yes Eubanks, Carlos American, NP  cephALEXin (KEFLEX) 500 MG capsule Take 1 capsule (500 mg total) by mouth 3 (three) times daily for 7 days. 10/23/17 10/30/17  Providence Lanius A, PA-C  glucose blood (TRUETEST TEST) test strip Use to check blood sugar once daily Dx:E11.22 09/16/17   Lauree Chandler, NP  TRUEPLUS LANCETS 30G MISC Use to check blood sugar once daily. Dx E11.9 06/13/15   Lauree Chandler, NP    Family History Family History  Problem Relation Age of Onset  . Parkinson's disease Mother   . Hip fracture  Mother   . Heart disease Mother   . Alzheimer's disease Mother   . Heart disease Father   . Parkinson's disease Sister   . Heart disease Brother     Social History Social History   Tobacco Use  . Smoking status: Former Smoker    Years: 50.00    Types: Pipe, Cigarettes    Last attempt to quit: 07/14/1996    Years since quitting: 21.2  . Smokeless tobacco: Never Used  Substance Use Topics  . Alcohol use: Yes    Alcohol/week: 0.6 oz    Types: 1 Cans of beer per week  . Drug use: No     Allergies   Morphine and related; Oxycodone; and Plavix [clopidogrel]   Review of Systems Review of Systems  Constitutional: Negative for fever.  Respiratory: Negative for shortness of breath.   Cardiovascular: Negative for chest pain.  Skin: Positive for color change and wound.  All other systems reviewed and are negative.    Physical Exam Updated Vital Signs BP 131/60   Pulse 61   Temp 98.3 F (36.8 C) (Oral)   Resp 15   Ht 5\' 3"  (1.6 m)   Wt 56.7 kg (125 lb)   SpO2 97%   BMI 22.14 kg/m   Physical Exam  Constitutional: He is oriented to person, place, and time. He appears well-developed and well-nourished.  HENT:  Head: Normocephalic and atraumatic.  Mouth/Throat: Oropharynx is clear and moist and mucous membranes are normal.  Eyes: Pupils are equal, round, and reactive to light. Conjunctivae, EOM and lids are normal.  Neck: Full passive range of motion without pain.  Cardiovascular: Normal rate, regular rhythm, normal heart sounds and normal pulses. Exam reveals no gallop and no friction rub.  No murmur heard. Pulses:      Dorsalis pedis pulses are 2+ on the right side, and 2+ on the left side.  Difficulty obtaining a palpable pulse on left foot secondary to edema. Good 2+ pulse with doppler.   Pulmonary/Chest: Effort normal and breath sounds normal.  Abdominal: Soft. Normal appearance. There is no tenderness. There is no rigidity and no guarding.  Musculoskeletal:  Normal range of motion.  Diffuse erythema of the LLE that begins just below the knee and extends distally toward the foot. Slight induration, warmth and edema noted to the dorsal foot that extends to the medial aspect of the leg.  Everything proximal to the foot appears to be chronic in nature. No calf tenderness. Scattered ulcers noted to the medial aspect  of the leg. No deformities or crepitus.   Neurological: He is alert and oriented to person, place, and time.  Sensation intact along major nerve distributions of BLE  Skin: Skin is warm and dry. Capillary refill takes less than 2 seconds.  Good distal cap refill. LLE is warm.  LLE is not dusky in appearance or cool to touch.  Psychiatric: He has a normal mood and affect. His speech is normal.  Nursing note and vitals reviewed.          ED Treatments / Results  Labs (all labs ordered are listed, but only abnormal results are displayed) Labs Reviewed  BASIC METABOLIC PANEL - Abnormal; Notable for the following components:      Result Value   Glucose, Bld 161 (*)    BUN 35 (*)    Calcium 8.8 (*)    GFR calc non Af Amer 55 (*)    All other components within normal limits  CBC WITH DIFFERENTIAL/PLATELET - Abnormal; Notable for the following components:   RBC 4.21 (*)    Hemoglobin 10.0 (*)    HCT 32.9 (*)    MCH 23.8 (*)    RDW 17.1 (*)    Lymphs Abs 0.5 (*)    All other components within normal limits    EKG None  Radiology No results found.  Procedures Procedures (including critical care time)  Medications Ordered in ED Medications  ceFAZolin (ANCEF) IVPB 1 g/50 mL premix (0 g Intravenous Stopped 10/23/17 1552)  lisinopril (PRINIVIL,ZESTRIL) tablet 10 mg (10 mg Oral Given 10/23/17 1725)     Initial Impression / Assessment and Plan / ED Course  I have reviewed the triage vital signs and the nursing notes.  Pertinent labs & imaging results that were available during my care of the patient were reviewed by me and  considered in my medical decision making (see chart for details).     84 y.o. M with PMH/p PVD, HTN who presents for worsening LLE redness/warmth. He is s/p left SFA stent done on 10/21/17 for claudication. D/c from hospital on 4/11. Patient with chronic wounds to LLE and is seen by wound care center today for worsening wounds. Family notes worsening redness, warmth of left foot and left medial leg. No fevers, no pain. Patient is afebrile, non-toxic appearing, sitting comfortably on examination table. Vital signs reviewed and stable. On exam, left foot with edema, erythema, warmth that extends to the medial aspect. Chronic ulcers noted. Difficulty obtaining palpable pulse secondary to edema but good pulse with doppler. Good cap refill. No dusky appearance to leg. Suspect cellulitis. History/PE is not concerning for DVT, vascular occlusion. Patient here with a note from wound doctor about concerns for cellulitis. Will plan for IV abx here in the ED. Plan to check basic labs.   Discussed patient with Dr. Donzetta Matters (Vascular). Updated him on patient's arrival in the ED. Does not appear to be vascular given good pulses and cap refill. No further vascular intervention needed. Plan to follow-up as directed.   CBC shows no significant leukocytosis. Slight anemia. Appears to have improved from previouys hospital stay. BMP shows normal Cr.   Given reassuring labs, vitals and presentation, will transition to PO abx. Patient to follow with wound care next week. Discussed with pharmacy regarding dosing for Keflex. They recommend tid for cellulitis. Discussed plan with patient and family and they are agreeable. Home supportive therapies dsicussed.  Patient had ample opportunity for questions and discussion. All patient's questions were answered with  full understanding. Strict return precautions discussed. Patient expresses understanding and agreement to plan.   Final Clinical Impressions(s) / ED Diagnoses   Final  diagnoses:  Cellulitis of left lower extremity    ED Discharge Orders        Ordered    cephALEXin (KEFLEX) 500 MG capsule  3 times daily     10/23/17 1628       Volanda Napoleon, PA-C 10/24/17 1555    Tanna Furry, MD 10/24/17 2332

## 2017-10-24 DIAGNOSIS — E1142 Type 2 diabetes mellitus with diabetic polyneuropathy: Secondary | ICD-10-CM | POA: Diagnosis not present

## 2017-10-24 DIAGNOSIS — I83892 Varicose veins of left lower extremities with other complications: Secondary | ICD-10-CM | POA: Diagnosis not present

## 2017-10-24 DIAGNOSIS — M6281 Muscle weakness (generalized): Secondary | ICD-10-CM | POA: Diagnosis not present

## 2017-10-24 DIAGNOSIS — I482 Chronic atrial fibrillation: Secondary | ICD-10-CM | POA: Diagnosis not present

## 2017-10-24 DIAGNOSIS — E440 Moderate protein-calorie malnutrition: Secondary | ICD-10-CM | POA: Diagnosis not present

## 2017-10-24 DIAGNOSIS — L89899 Pressure ulcer of other site, unspecified stage: Secondary | ICD-10-CM | POA: Diagnosis not present

## 2017-10-25 ENCOUNTER — Inpatient Hospital Stay (HOSPITAL_COMMUNITY)
Admission: EM | Admit: 2017-10-25 | Discharge: 2017-10-28 | DRG: 315 | Disposition: A | Discharge status: Home Health | Payer: Medicare Other | Attending: Family Medicine | Admitting: Family Medicine

## 2017-10-25 ENCOUNTER — Encounter (HOSPITAL_COMMUNITY): Payer: Self-pay | Admitting: Emergency Medicine

## 2017-10-25 ENCOUNTER — Emergency Department (HOSPITAL_COMMUNITY): Payer: Medicare Other

## 2017-10-25 ENCOUNTER — Other Ambulatory Visit: Payer: Self-pay

## 2017-10-25 DIAGNOSIS — I129 Hypertensive chronic kidney disease with stage 1 through stage 4 chronic kidney disease, or unspecified chronic kidney disease: Secondary | ICD-10-CM | POA: Diagnosis present

## 2017-10-25 DIAGNOSIS — Z7901 Long term (current) use of anticoagulants: Secondary | ICD-10-CM

## 2017-10-25 DIAGNOSIS — L03818 Cellulitis of other sites: Secondary | ICD-10-CM

## 2017-10-25 DIAGNOSIS — I482 Chronic atrial fibrillation, unspecified: Secondary | ICD-10-CM | POA: Diagnosis present

## 2017-10-25 DIAGNOSIS — Y832 Surgical operation with anastomosis, bypass or graft as the cause of abnormal reaction of the patient, or of later complication, without mention of misadventure at the time of the procedure: Secondary | ICD-10-CM | POA: Diagnosis present

## 2017-10-25 DIAGNOSIS — L97309 Non-pressure chronic ulcer of unspecified ankle with unspecified severity: Secondary | ICD-10-CM

## 2017-10-25 DIAGNOSIS — I10 Essential (primary) hypertension: Secondary | ICD-10-CM | POA: Diagnosis present

## 2017-10-25 DIAGNOSIS — Z79899 Other long term (current) drug therapy: Secondary | ICD-10-CM

## 2017-10-25 DIAGNOSIS — T827XXA Infection and inflammatory reaction due to other cardiac and vascular devices, implants and grafts, initial encounter: Principal | ICD-10-CM | POA: Diagnosis present

## 2017-10-25 DIAGNOSIS — I83003 Varicose veins of unspecified lower extremity with ulcer of ankle: Secondary | ICD-10-CM | POA: Diagnosis present

## 2017-10-25 DIAGNOSIS — E11622 Type 2 diabetes mellitus with other skin ulcer: Secondary | ICD-10-CM | POA: Diagnosis present

## 2017-10-25 DIAGNOSIS — E7800 Pure hypercholesterolemia, unspecified: Secondary | ICD-10-CM | POA: Diagnosis present

## 2017-10-25 DIAGNOSIS — E1151 Type 2 diabetes mellitus with diabetic peripheral angiopathy without gangrene: Secondary | ICD-10-CM | POA: Diagnosis present

## 2017-10-25 DIAGNOSIS — Z7982 Long term (current) use of aspirin: Secondary | ICD-10-CM

## 2017-10-25 DIAGNOSIS — L03116 Cellulitis of left lower limb: Secondary | ICD-10-CM | POA: Diagnosis not present

## 2017-10-25 DIAGNOSIS — E1122 Type 2 diabetes mellitus with diabetic chronic kidney disease: Secondary | ICD-10-CM | POA: Diagnosis present

## 2017-10-25 DIAGNOSIS — Z96611 Presence of right artificial shoulder joint: Secondary | ICD-10-CM | POA: Diagnosis present

## 2017-10-25 DIAGNOSIS — Z8249 Family history of ischemic heart disease and other diseases of the circulatory system: Secondary | ICD-10-CM

## 2017-10-25 DIAGNOSIS — L97929 Non-pressure chronic ulcer of unspecified part of left lower leg with unspecified severity: Secondary | ICD-10-CM | POA: Diagnosis present

## 2017-10-25 DIAGNOSIS — Z7984 Long term (current) use of oral hypoglycemic drugs: Secondary | ICD-10-CM

## 2017-10-25 DIAGNOSIS — N183 Chronic kidney disease, stage 3 (moderate): Secondary | ICD-10-CM | POA: Diagnosis present

## 2017-10-25 DIAGNOSIS — L0390 Cellulitis, unspecified: Secondary | ICD-10-CM | POA: Diagnosis present

## 2017-10-25 DIAGNOSIS — K219 Gastro-esophageal reflux disease without esophagitis: Secondary | ICD-10-CM | POA: Diagnosis present

## 2017-10-25 DIAGNOSIS — I739 Peripheral vascular disease, unspecified: Secondary | ICD-10-CM | POA: Diagnosis present

## 2017-10-25 DIAGNOSIS — L97329 Non-pressure chronic ulcer of left ankle with unspecified severity: Secondary | ICD-10-CM | POA: Diagnosis present

## 2017-10-25 DIAGNOSIS — Z7902 Long term (current) use of antithrombotics/antiplatelets: Secondary | ICD-10-CM

## 2017-10-25 DIAGNOSIS — B9562 Methicillin resistant Staphylococcus aureus infection as the cause of diseases classified elsewhere: Secondary | ICD-10-CM | POA: Diagnosis present

## 2017-10-25 DIAGNOSIS — Z66 Do not resuscitate: Secondary | ICD-10-CM | POA: Diagnosis present

## 2017-10-25 DIAGNOSIS — R509 Fever, unspecified: Secondary | ICD-10-CM | POA: Diagnosis not present

## 2017-10-25 LAB — BASIC METABOLIC PANEL
ANION GAP: 10 (ref 5–15)
BUN: 45 mg/dL — AB (ref 6–20)
CALCIUM: 9.4 mg/dL (ref 8.9–10.3)
CO2: 23 mmol/L (ref 22–32)
CREATININE: 0.92 mg/dL (ref 0.61–1.24)
Chloride: 106 mmol/L (ref 101–111)
GFR calc Af Amer: >60 mL/min (ref >60)
GLUCOSE: 175 mg/dL — AB (ref 65–99)
Potassium: 4.3 mmol/L (ref 3.5–5.1)
Sodium: 139 mmol/L (ref 135–145)

## 2017-10-25 LAB — CBC WITH DIFFERENTIAL/PLATELET
BASOS ABS: 0 10*3/uL (ref 0.0–0.1)
Basophils Relative: 0 %
EOS PCT: 3 %
Eosinophils Absolute: 0.3 10*3/uL (ref 0.0–0.7)
HEMATOCRIT: 35.5 % — AB (ref 39.0–52.0)
Hemoglobin: 10.6 g/dL — ABNORMAL LOW (ref 13.0–17.0)
LYMPHS PCT: 9 %
Lymphs Abs: 0.8 10*3/uL (ref 0.7–4.0)
MCH: 23.7 pg — ABNORMAL LOW (ref 26.0–34.0)
MCHC: 29.9 g/dL — AB (ref 30.0–36.0)
MCV: 79.4 fL (ref 78.0–100.0)
MONO ABS: 0.7 10*3/uL (ref 0.1–1.0)
MONOS PCT: 9 %
NEUTROS ABS: 6.9 10*3/uL (ref 1.7–7.7)
Neutrophils Relative %: 79 %
PLATELETS: 275 10*3/uL (ref 150–400)
RBC: 4.47 MIL/uL (ref 4.22–5.81)
RDW: 17.5 % — AB (ref 11.5–15.5)
WBC: 8.7 10*3/uL (ref 4.0–10.5)

## 2017-10-25 LAB — I-STAT CG4 LACTIC ACID, ED: Lactic Acid, Venous: 1.74 mmol/L (ref 0.5–1.9)

## 2017-10-25 MED ORDER — VANCOMYCIN HCL IN DEXTROSE 1-5 GM/200ML-% IV SOLN
1000.0000 mg | Freq: Once | INTRAVENOUS | Status: AC
  Administered 2017-10-26: 1000 mg via INTRAVENOUS
  Filled 2017-10-25: qty 200

## 2017-10-25 MED ORDER — PIPERACILLIN-TAZOBACTAM 3.375 G IVPB 30 MIN
3.3750 g | Freq: Once | INTRAVENOUS | Status: AC
  Administered 2017-10-25: 3.375 g via INTRAVENOUS
  Filled 2017-10-25: qty 50

## 2017-10-25 NOTE — ED Triage Notes (Signed)
Pt family reports that he was seen at wound care last week and was dx with cellulitis of left lower leg and was found that the redness has extended past the area that was circled on Friday. Pt is currently on oral abx and was taken off of Plavix and placed on Aspirin.

## 2017-10-26 ENCOUNTER — Encounter (HOSPITAL_COMMUNITY): Payer: Self-pay | Admitting: Emergency Medicine

## 2017-10-26 DIAGNOSIS — L97329 Non-pressure chronic ulcer of left ankle with unspecified severity: Secondary | ICD-10-CM

## 2017-10-26 DIAGNOSIS — L03818 Cellulitis of other sites: Secondary | ICD-10-CM

## 2017-10-26 DIAGNOSIS — L03116 Cellulitis of left lower limb: Secondary | ICD-10-CM | POA: Diagnosis not present

## 2017-10-26 DIAGNOSIS — I83023 Varicose veins of left lower extremity with ulcer of ankle: Secondary | ICD-10-CM | POA: Diagnosis not present

## 2017-10-26 DIAGNOSIS — Z8249 Family history of ischemic heart disease and other diseases of the circulatory system: Secondary | ICD-10-CM | POA: Diagnosis not present

## 2017-10-26 DIAGNOSIS — E1151 Type 2 diabetes mellitus with diabetic peripheral angiopathy without gangrene: Secondary | ICD-10-CM | POA: Diagnosis present

## 2017-10-26 DIAGNOSIS — R509 Fever, unspecified: Secondary | ICD-10-CM | POA: Diagnosis not present

## 2017-10-26 DIAGNOSIS — Z7901 Long term (current) use of anticoagulants: Secondary | ICD-10-CM | POA: Diagnosis not present

## 2017-10-26 DIAGNOSIS — E7800 Pure hypercholesterolemia, unspecified: Secondary | ICD-10-CM | POA: Diagnosis present

## 2017-10-26 DIAGNOSIS — N183 Chronic kidney disease, stage 3 (moderate): Secondary | ICD-10-CM | POA: Diagnosis not present

## 2017-10-26 DIAGNOSIS — Z7984 Long term (current) use of oral hypoglycemic drugs: Secondary | ICD-10-CM | POA: Diagnosis not present

## 2017-10-26 DIAGNOSIS — L97929 Non-pressure chronic ulcer of unspecified part of left lower leg with unspecified severity: Secondary | ICD-10-CM | POA: Diagnosis present

## 2017-10-26 DIAGNOSIS — B9562 Methicillin resistant Staphylococcus aureus infection as the cause of diseases classified elsewhere: Secondary | ICD-10-CM | POA: Diagnosis present

## 2017-10-26 DIAGNOSIS — L0390 Cellulitis, unspecified: Secondary | ICD-10-CM | POA: Diagnosis present

## 2017-10-26 DIAGNOSIS — I482 Chronic atrial fibrillation: Secondary | ICD-10-CM | POA: Diagnosis not present

## 2017-10-26 DIAGNOSIS — E11622 Type 2 diabetes mellitus with other skin ulcer: Secondary | ICD-10-CM | POA: Diagnosis present

## 2017-10-26 DIAGNOSIS — Z79899 Other long term (current) drug therapy: Secondary | ICD-10-CM | POA: Diagnosis not present

## 2017-10-26 DIAGNOSIS — I10 Essential (primary) hypertension: Secondary | ICD-10-CM | POA: Diagnosis not present

## 2017-10-26 DIAGNOSIS — Z96611 Presence of right artificial shoulder joint: Secondary | ICD-10-CM | POA: Diagnosis present

## 2017-10-26 DIAGNOSIS — I129 Hypertensive chronic kidney disease with stage 1 through stage 4 chronic kidney disease, or unspecified chronic kidney disease: Secondary | ICD-10-CM | POA: Diagnosis present

## 2017-10-26 DIAGNOSIS — I739 Peripheral vascular disease, unspecified: Secondary | ICD-10-CM

## 2017-10-26 DIAGNOSIS — Y832 Surgical operation with anastomosis, bypass or graft as the cause of abnormal reaction of the patient, or of later complication, without mention of misadventure at the time of the procedure: Secondary | ICD-10-CM | POA: Diagnosis present

## 2017-10-26 DIAGNOSIS — E1122 Type 2 diabetes mellitus with diabetic chronic kidney disease: Secondary | ICD-10-CM

## 2017-10-26 DIAGNOSIS — T827XXA Infection and inflammatory reaction due to other cardiac and vascular devices, implants and grafts, initial encounter: Secondary | ICD-10-CM | POA: Diagnosis present

## 2017-10-26 DIAGNOSIS — Z66 Do not resuscitate: Secondary | ICD-10-CM | POA: Diagnosis present

## 2017-10-26 DIAGNOSIS — K219 Gastro-esophageal reflux disease without esophagitis: Secondary | ICD-10-CM | POA: Diagnosis present

## 2017-10-26 DIAGNOSIS — Z7902 Long term (current) use of antithrombotics/antiplatelets: Secondary | ICD-10-CM | POA: Diagnosis not present

## 2017-10-26 DIAGNOSIS — Z7982 Long term (current) use of aspirin: Secondary | ICD-10-CM | POA: Diagnosis not present

## 2017-10-26 LAB — BASIC METABOLIC PANEL
Anion gap: 11 (ref 5–15)
BUN: 40 mg/dL — ABNORMAL HIGH (ref 6–20)
CHLORIDE: 107 mmol/L (ref 101–111)
CO2: 18 mmol/L — ABNORMAL LOW (ref 22–32)
Calcium: 8.7 mg/dL — ABNORMAL LOW (ref 8.9–10.3)
Creatinine, Ser: 0.95 mg/dL (ref 0.61–1.24)
GFR calc Af Amer: >60 mL/min (ref >60)
GFR calc non Af Amer: >60 mL/min (ref >60)
GLUCOSE: 161 mg/dL — AB (ref 65–99)
POTASSIUM: 4.8 mmol/L (ref 3.5–5.1)
Sodium: 136 mmol/L (ref 135–145)

## 2017-10-26 LAB — CBC
HCT: 31.2 % — ABNORMAL LOW (ref 39.0–52.0)
HEMOGLOBIN: 9.4 g/dL — AB (ref 13.0–17.0)
MCH: 23.2 pg — AB (ref 26.0–34.0)
MCHC: 30.1 g/dL (ref 30.0–36.0)
MCV: 76.8 fL — AB (ref 78.0–100.0)
PLATELETS: 218 10*3/uL (ref 150–400)
RBC: 4.06 MIL/uL — AB (ref 4.22–5.81)
RDW: 17.2 % — ABNORMAL HIGH (ref 11.5–15.5)
WBC: 8.2 10*3/uL (ref 4.0–10.5)

## 2017-10-26 LAB — GLUCOSE, CAPILLARY
Glucose-Capillary: 119 mg/dL — ABNORMAL HIGH (ref 65–99)
Glucose-Capillary: 158 mg/dL — ABNORMAL HIGH (ref 65–99)
Glucose-Capillary: 140 mg/dL — ABNORMAL HIGH (ref 65–99)

## 2017-10-26 MED ORDER — ONDANSETRON HCL 4 MG/2ML IJ SOLN: 4.0000 mg | Freq: Four times a day (QID) | INTRAMUSCULAR | Status: DC | PRN

## 2017-10-26 MED ORDER — INSULIN ASPART 100 UNIT/ML ~~LOC~~ SOLN
0.0000 [IU] | Freq: Three times a day (TID) | SUBCUTANEOUS | Status: DC
  Administered 2017-10-27: 2 [IU] via SUBCUTANEOUS
  Administered 2017-10-27: 1 [IU] via SUBCUTANEOUS
  Administered 2017-10-28: 2 [IU] via SUBCUTANEOUS
  Administered 2017-10-28: 1 [IU] via SUBCUTANEOUS

## 2017-10-26 MED ORDER — DIPHENHYDRAMINE HCL 25 MG PO CAPS
25.0000 mg | ORAL_CAPSULE | Freq: Four times a day (QID) | ORAL | Status: DC | PRN
  Administered 2017-10-26 – 2017-10-27 (×2): 25 mg via ORAL
  Filled 2017-10-26 (×3): qty 1

## 2017-10-26 MED ORDER — METOPROLOL TARTRATE 25 MG PO TABS
25.0000 mg | ORAL_TABLET | Freq: Two times a day (BID) | ORAL | Status: DC
  Administered 2017-10-26 – 2017-10-28 (×6): 25 mg via ORAL
  Filled 2017-10-26 (×6): qty 1

## 2017-10-26 MED ORDER — APIXABAN 2.5 MG PO TABS
2.5000 mg | ORAL_TABLET | Freq: Two times a day (BID) | ORAL | Status: DC
  Administered 2017-10-26 – 2017-10-28 (×6): 2.5 mg via ORAL
  Filled 2017-10-26 (×7): qty 1

## 2017-10-26 MED ORDER — SIMVASTATIN 10 MG PO TABS
10.0000 mg | ORAL_TABLET | Freq: Every day | ORAL | Status: DC
  Administered 2017-10-26 – 2017-10-27 (×2): 10 mg via ORAL
  Filled 2017-10-26 (×2): qty 1

## 2017-10-26 MED ORDER — ATORVASTATIN CALCIUM 40 MG PO TABS: 40.0000 mg | ORAL_TABLET | Freq: Every day | ORAL | Status: DC

## 2017-10-26 MED ORDER — ONDANSETRON HCL 4 MG PO TABS: 4.0000 mg | ORAL_TABLET | Freq: Four times a day (QID) | ORAL | Status: DC | PRN

## 2017-10-26 MED ORDER — ACETAMINOPHEN 325 MG PO TABS: 650.0000 mg | ORAL_TABLET | Freq: Four times a day (QID) | ORAL | Status: DC | PRN

## 2017-10-26 MED ORDER — ALBUTEROL SULFATE (2.5 MG/3ML) 0.083% IN NEBU: 2.5000 mg | INHALATION_SOLUTION | Freq: Four times a day (QID) | RESPIRATORY_TRACT | Status: DC | PRN

## 2017-10-26 MED ORDER — PIPERACILLIN-TAZOBACTAM 3.375 G IVPB
3.3750 g | Freq: Three times a day (TID) | INTRAVENOUS | Status: DC
  Administered 2017-10-26 – 2017-10-28 (×7): 3.375 g via INTRAVENOUS
  Filled 2017-10-26 (×9): qty 50

## 2017-10-26 MED ORDER — ASPIRIN EC 81 MG PO TBEC
162.0000 mg | DELAYED_RELEASE_TABLET | Freq: Two times a day (BID) | ORAL | Status: DC
  Administered 2017-10-26 – 2017-10-28 (×5): 162 mg via ORAL
  Filled 2017-10-26 (×5): qty 2

## 2017-10-26 MED ORDER — LISINOPRIL 10 MG PO TABS
10.0000 mg | ORAL_TABLET | Freq: Every day | ORAL | Status: DC
  Administered 2017-10-26 – 2017-10-28 (×3): 10 mg via ORAL
  Filled 2017-10-26 (×3): qty 1

## 2017-10-26 MED ORDER — FERROUS SULFATE 325 (65 FE) MG PO TABS
325.0000 mg | ORAL_TABLET | ORAL | Status: DC
  Administered 2017-10-26: 325 mg via ORAL
  Filled 2017-10-26: qty 1

## 2017-10-26 MED ORDER — ASPIRIN EC 81 MG PO TBEC
162.0000 mg | DELAYED_RELEASE_TABLET | ORAL | Status: AC
  Administered 2017-10-26: 162 mg via ORAL
  Filled 2017-10-26: qty 2

## 2017-10-26 MED ORDER — ACETAMINOPHEN 650 MG RE SUPP: 650.0000 mg | Freq: Four times a day (QID) | RECTAL | Status: DC | PRN

## 2017-10-26 MED ORDER — PIPERACILLIN-TAZOBACTAM 3.375 G IVPB 30 MIN: 3.3750 g | Freq: Three times a day (TID) | INTRAVENOUS | Status: DC

## 2017-10-26 MED ORDER — VANCOMYCIN HCL IN DEXTROSE 750-5 MG/150ML-% IV SOLN
750.0000 mg | INTRAVENOUS | Status: DC
  Administered 2017-10-27: 750 mg via INTRAVENOUS
  Filled 2017-10-26 (×2): qty 150

## 2017-10-26 MED ORDER — DIPHENHYDRAMINE HCL 50 MG/ML IJ SOLN
25.0000 mg | INTRAMUSCULAR | Status: AC
Start: 2017-10-26 — End: 2017-10-26
  Administered 2017-10-26: 25 mg via INTRAVENOUS
  Filled 2017-10-26: qty 1

## 2017-10-26 MED ORDER — COLLAGENASE 250 UNIT/GM EX OINT
TOPICAL_OINTMENT | Freq: Every day | CUTANEOUS | Status: DC
Start: 2017-10-26 — End: 2017-10-28
  Administered 2017-10-26 – 2017-10-28 (×2): via TOPICAL
  Filled 2017-10-26 (×2): qty 90

## 2017-10-26 MED ORDER — RISAQUAD PO CAPS
1.0000 | ORAL_CAPSULE | Freq: Every day | ORAL | Status: DC
  Administered 2017-10-26 – 2017-10-28 (×3): 1 via ORAL
  Filled 2017-10-26 (×3): qty 1

## 2017-10-26 MED ORDER — HYDRALAZINE HCL 20 MG/ML IJ SOLN: 10.0000 mg | Freq: Four times a day (QID) | INTRAMUSCULAR | Status: DC | PRN

## 2017-10-26 MED ORDER — VANCOMYCIN HCL IN DEXTROSE 1-5 GM/200ML-% IV SOLN: 1000.0000 mg | INTRAVENOUS | Status: DC

## 2017-10-26 NOTE — ED Notes (Signed)
ED TO INPATIENT HANDOFF REPORT  Name/Age/Gender Logan Bean 84 y.o. male  Code Status Code Status History    Date Active Date Inactive Code Status Order ID Comments User Context   10/21/2017 1548 10/22/2017 1506 Full Code [id number]  Waynetta Sandy, MD Inpatient   04/27/2015 0047 05/01/2015 1907 DNR [id number]  Rise Patience, MD Inpatient   05/18/2014 2328 05/19/2014 1956 Full Code [id number]  Ivor Costa, MD ED   09/17/2012 1850 09/19/2012 1933 Full Code [id number]  Augustin Schooling, MD Inpatient   11/05/2011 1434 11/06/2011 1626 Full Code [id number]  Governor Rooks, RN Inpatient      Home/SNF/Other Home  Chief Complaint Cellulitis  Level of Care/Admitting Diagnosis ED Disposition    ED Disposition Condition Comment   Admit  The patient appears reasonably stabilized for admission considering the current resources, flow, and capabilities available in the ED at this time, and I doubt any other [facility] requiring further screening and/or treatment in the ED prior to admission is  present.       Medical History Past Medical History:  Diagnosis Date  . Anxiety   . Arthritis    "probably all over" (10/21/2017)  . Depression   . Diaphragmatic hernia without mention of obstruction or gangrene   . Dizziness and giddiness   . Edema   . GERD (gastroesophageal reflux disease)   . H/O hiatal hernia   . History of gout   . History of kidney stones   . History of stomach ulcers   . Hypercholesteremia   . Hypertension   . Left rotator cuff tear Jul 12, 2012  . PVD (peripheral vascular disease) ([place])   . Stomach ulcer    years  . Type II diabetes mellitus ([place])   . Unspecified hereditary and idiopathic peripheral neuropathy   . Unspecified vitamin D deficiency   . Varicose veins    both legs    Allergies Allergies  Allergen Reactions  . Morphine And Related Nausea And Vomiting  . Oxycodone Nausea And Vomiting  . Plavix [Clopidogrel]     Rash on upper body    IV  Location/Drains/Wounds Patient Lines/Drains/Airways Status   Active Line/Drains/Airways    Name:   Placement date:   Placement time:   Site:   Days:   Peripheral IV 10/25/17 Right Wrist   10/25/17    2252    Wrist   1          Labs/Imaging Results for orders placed or performed during the hospital encounter of 10/25/17 (from the past 48 hour(s))  CBC with Differential     Status: Abnormal   Collection Time: 10/25/17 10:45 PM  Result Value Ref Range   WBC 8.7 4.0 - 10.5 K/uL   RBC 4.47 4.22 - 5.81 MIL/uL   Hemoglobin 10.6 (L) 13.0 - 17.0 g/dL   HCT 35.5 (L) 39.0 - 52.0 %   MCV 79.4 78.0 - 100.0 fL   MCH 23.7 (L) 26.0 - 34.0 pg   MCHC 29.9 (L) 30.0 - 36.0 g/dL   RDW 17.5 (H) 11.5 - 15.5 %   Platelets 275 150 - 400 K/uL   Neutrophils Relative % 79 %   Neutro Abs 6.9 1.7 - 7.7 K/uL   Lymphocytes Relative 9 %   Lymphs Abs 0.8 0.7 - 4.0 K/uL   Monocytes Relative 9 %   Monocytes Absolute 0.7 0.1 - 1.0 K/uL   Eosinophils Relative 3 %   Eosinophils Absolute 0.3 0.0 - 0.7 K/uL  Basophils Relative 0 %   Basophils Absolute 0.0 0.0 - 0.1 K/uL    Comment: Performed at [facility], [place] [address], [place], [place] [address]  Basic metabolic panel     Status: Abnormal   Collection Time: 10/25/17 10:45 PM  Result Value Ref Range   Sodium 139 135 - 145 mmol/L   Potassium 4.3 3.5 - 5.1 mmol/L   Chloride 106 101 - 111 mmol/L   CO2 23 22 - 32 mmol/L   Glucose, Bld 175 (H) 65 - 99 mg/dL   BUN 45 (H) 6 - 20 mg/dL   Creatinine, Ser 0.92 0.61 - 1.24 mg/dL   Calcium 9.4 8.9 - 10.3 mg/dL   GFR calc non Af Amer >60 >60 mL/min   GFR calc Af Amer >60 >60 mL/min    Comment: (NOTE) The eGFR has been calculated using the CKD EPI equation. This calculation has not been validated in all clinical situations. eGFR's persistently <60 mL/min signify possible Chronic Kidney Disease.    Anion gap 10 5 - 15    Comment: Performed at [facility], [place] [address], [place], [place] [address]  I-Stat CG4 Lactic Acid, ED     Status: None   Collection Time: 10/25/17 10:59 PM  Result Value Ref Range   Lactic Acid, Venous 1.74 0.5 - 1.9 mmol/L   Dg Foot Complete Left  Result Date: 10/25/2017 CLINICAL DATA:  Cellulitis wound above the medial malleolus EXAM: LEFT FOOT - COMPLETE 3+ VIEW COMPARISON:  07/30/2011 FINDINGS: Diffuse osteopenia. Mild sclerosis and irregularity at the head of the fifth metatarsal. No definite periostitis or bony destructive changes. Suggested osseous bridging between the navicular and cuboid with sclerosis and probable partial fusion at the cuneiform cuboidal articulation. These findings do not appear significantly changed. No soft tissue gas. IMPRESSION: 1. Diffuse osteopenia. Possible subacute fracture at the head of the fifth metatarsal. Electronically Signed   By: Donavan Foil M.D.   On: 10/25/2017 23:49    Pending Labs Unresulted Labs (From admission, onward)   Start     Ordered   10/25/17 2311  Blood culture (routine x 2)  BLOOD CULTURE X 2,   STAT     10/25/17 2310      Vitals/Pain Today's Vitals   10/25/17 1918 10/25/17 1938 10/25/17 2154 10/26/17 0030  BP: (!) 143/68  120/80 135/86  Pulse: 89  95   Resp: 18  18 (!) 24  Temp: 98.2 F (36.8 C)     TempSrc: Oral     SpO2: 97%  99%   Weight:  125 lb (56.7 kg)    Height:  '5\' 3"'  (1.6 m)    PainSc:  0-No pain      Isolation Precautions No active isolations  Medications Medications  vancomycin (VANCOCIN) IVPB 1000 mg/200 mL premix (has no administration in time range)  piperacillin-tazobactam (ZOSYN) IVPB 3.375 g (3.375 g Intravenous New Bag/Given 10/25/17 2359)    Mobility non-ambulatory

## 2017-10-26 NOTE — ED Provider Notes (Signed)
[facility] DEPT Provider Note   CSN: [id number] Arrival date & time: 10/25/17  1910     History   Chief Complaint Chief Complaint  Patient presents with  . Recurrent Skin Infections    HPI Logan Bean is a 84 y.o. male.  The history is provided by the patient.  Illness  This is a recurrent problem. The current episode started 12 to 24 hours ago. The problem occurs constantly. The problem has not changed since onset.Pertinent negatives include no chest pain, no abdominal pain, no headaches and no shortness of breath. Nothing aggravates the symptoms. Nothing relieves the symptoms. He has tried nothing for the symptoms. The treatment provided no relief.  Seeing wound care and has had cellulitis.  Has been since in the Ed and it was marked and patient is on abx, not outside the linezs.    Past Medical History:  Diagnosis Date  . Anxiety   . Arthritis    "probably all over" (10/21/2017)  . Depression   . Diaphragmatic hernia without mention of obstruction or gangrene   . Dizziness and giddiness   . Edema   . GERD (gastroesophageal reflux disease)   . H/O hiatal hernia   . History of gout   . History of kidney stones   . History of stomach ulcers   . Hypercholesteremia   . Hypertension   . Left rotator cuff tear Jul 12, 2012  . PVD (peripheral vascular disease) ([place])   . Stomach ulcer    years  . Type II diabetes mellitus ([place])   . Unspecified hereditary and idiopathic peripheral neuropathy   . Unspecified vitamin D deficiency   . Varicose veins    both legs    Patient Active Problem List   Diagnosis Date Noted  . PAD (peripheral artery disease) ([place]) 10/21/2017  . Iron deficiency anemia secondary to inadequate dietary iron intake 08/19/2016  . Other constipation 08/19/2016  . Capillary vascular disease 11/21/2015  . Venous ulcer of ankle ([place]) 11/02/2015  . Near syncope 04/27/2015  . Type 2 diabetes mellitus with hyperglycemia  ([place]) 04/27/2015  . Contusion of multiple sites 04/26/2015  . Type 2 diabetes mellitus with diabetic chronic kidney disease ([place]) 07/11/2014  . Colon cancer screening 07/11/2014  . Hygroma 05/19/2014  . Atrial fibrillation with RVR ([place]) 05/18/2014  . Laceration of skin of face 05/18/2014  . Fall   . Venous stasis dermatitis 05/16/2014  . Carpal tunnel syndrome 05/10/2014  . CKD (chronic kidney disease) stage 3, GFR 30-59 ml/min ([facility]) 10/11/2013  . Restless leg syndrome 05/25/2013  . Hyperthyroidism, subclinical 03/02/2013  . Chronic anticoagulation-Eliquis 01/31/2013  . Chronic atrial fibrillation ([place]) 01/21/2013  . Sweating increase 01/19/2013  . CHF (congestive heart failure) ([place]) 01/19/2013  . Pleural effusion 01/19/2013  . Multinodular goiter 01/19/2013  . Dyspnea 01/15/2013  . Edema 01/15/2013  . Hypokalemia 01/15/2013  . Rash and nonspecific skin eruption 01/15/2013  . Dyslipidemia 12/29/2012  . GERD (gastroesophageal reflux disease) 12/29/2012  . Loss of weight 12/29/2012  . Closed bimalleolar fracture of right ankle 09/28/2012  . Diarrhea 09/22/2012  . Essential hypertension 09/21/2012  . Complete tear of left rotator cuff 11/05/2011    Past Surgical History:  Procedure Laterality Date  . ABDOMINAL AORTOGRAM W/LOWER EXTREMITY N/A 10/21/2017   Procedure: ABDOMINAL AORTOGRAM W/LOWER EXTREMITY - Left;  Surgeon: Waynetta Sandy, MD;  Location: [facility];  Service: Cardiovascular;  Laterality: N/A;  . CARDIOVERSION N/A 02/22/2013   Procedure:  CARDIOVERSION;  Surgeon: Darlin Coco, MD;  Location: [facility];  Service: Cardiovascular;  Laterality: N/A;  . CATARACT EXTRACTION W/ INTRAOCULAR LENS  IMPLANT, BILATERAL Bilateral   . ESOPHAGEAL DILATION  2003 and 3 yrs ago  . FRACTURE SURGERY    . MASTECTOMY     bilateral  . ORIF ANKLE FRACTURE Right 09/20/2012   Procedure: OPEN REDUCTION INTERNAL FIXATION (ORIF) ANKLE FRACTURE;  Surgeon: Johnn Hai, MD;  Location: [facility];  Service: Orthopedics;  Laterality: Right;  . PERIPHERAL VASCULAR INTERVENTION Left 10/21/2017  . PERIPHERAL VASCULAR INTERVENTION Left 10/21/2017   Procedure: PERIPHERAL VASCULAR INTERVENTION;  Surgeon: Waynetta Sandy, MD;  Location: [facility];  Service: Cardiovascular;  Laterality: Left;  . REVERSE SHOULDER ARTHROPLASTY Right 09/17/2012   Procedure: REVERSE SHOULDER ARTHROPLASTY;  Surgeon: Augustin Schooling, MD;  Location: [facility];  Service: Orthopedics;  Laterality: Right;  . SHOULDER OPEN ROTATOR CUFF REPAIR  11/05/2011   Procedure: ROTATOR CUFF REPAIR SHOULDER OPEN;  Surgeon: Tobi Bastos, MD;  Location: [facility];  Service: Orthopedics;  Laterality: Left;  Left Shoulder Rotator Cuff Repair Open with Graft and Anchors  . TONSILLECTOMY  age 47     OB History   None      Home Medications    Prior to Admission medications   Medication Sig Start Date End Date Taking? Authorizing Provider  acetaminophen (TYLENOL) 500 MG tablet Take 500 mg by mouth every 6 (six) hours as needed for mild pain, moderate pain or headache.    Yes [provider]  aspirin EC 81 MG tablet Take 81 mg by mouth daily.   Yes [provider]  Calcium Carbonate-Vitamin D 600-200 MG-UNIT TABS Take 1 tablet by mouth 2 (two) times daily. Take 1 tablet daily for vitamin supplement.   Yes [provider]  cephALEXin (KEFLEX) 500 MG capsule Take 1 capsule (500 mg total) by mouth 3 (three) times daily for 7 days. 10/23/17 10/30/17 Yes Volanda Napoleon, PA-C  diphenhydrAMINE (BENADRYL ALLERGY) 25 MG tablet Take 25 mg by mouth at bedtime as needed (rash due to Plavix).   Yes [provider]  ELIQUIS 2.5 MG TABS tablet TAKE 1 TABLET BY MOUTH TWICE DAILY. 06/11/17  Yes Lauree Chandler, NP  ferrous sulfate 325 (65 FE) MG tablet Take 325 mg by mouth 2 (two) times a week. Takes one tablet by mouth every three days    Yes [provider]    furosemide (LASIX) 20 MG tablet TAKE (1/2) TABLET ONCE A DAY FOR EDEMA, HYPERTENSION. Patient taking differently: take 10-20 mg by mouth daily as needed for fluid 12/23/16  Yes Eubanks, Carlos American, NP  Glucosamine-Chondroit-Vit C-Mn (GLUCOSAMINE 1500 COMPLEX) CAPS Take one tablet once a day 01/12/13  Yes Bubba Camp, Mahima, MD  glucose blood (TRUETEST TEST) test strip Use to check blood sugar once daily Dx:E11.22 09/16/17  Yes Eubanks, Carlos American, NP  lisinopril (PRINIVIL,ZESTRIL) 10 MG tablet TAKE 1 TABLET BY MOUTH DAILY. 06/11/17  Yes Lauree Chandler, NP  metFORMIN (GLUCOPHAGE) 500 MG tablet TAKE 1 TABLET BY MOUTH TWICE DAILY WITH A MEAL. 06/11/17  Yes Lauree Chandler, NP  metoprolol tartrate (LOPRESSOR) 25 MG tablet TAKE 1 TABLET BY MOUTH TWICE DAILY. 06/11/17  Yes Lauree Chandler, NP  Multiple Vitamin (MULITIVITAMIN WITH MINERALS) TABS Take 1 tablet by mouth daily.    Yes [provider]  Omega-3 Fatty Acids (FISH OIL) 1000 MG CAPS Take 1,000 mg by mouth daily.  Yes [provider]  Probiotic Product (PROBIOTIC PO) Take 1 tablet by mouth daily.   Yes [provider]  simvastatin (ZOCOR) 10 MG tablet TAKE ONE TABLET AT BEDTIME. Patient taking differently: TAKE ONE TABLET (10 mg)  AT BEDTIME. 06/11/17  Yes Eubanks, Carlos American, NP  TRUEPLUS LANCETS 30G MISC Use to check blood sugar once daily. Dx E11.9 06/13/15  Yes Lauree Chandler, NP  clopidogrel (PLAVIX) 75 MG tablet Take 1 tablet (75 mg total) by mouth daily with breakfast. 10/22/17   Rhyne, Hulen Shouts, PA-C    Family History Family History  Problem Relation Age of Onset  . Parkinson's disease Mother   . Hip fracture Mother   . Heart disease Mother   . Alzheimer's disease Mother   . Heart disease Father   . Parkinson's disease Sister   . Heart disease Brother     Social History Social History   Tobacco Use  . Smoking status: Former Smoker    Years: 50.00    Types: Pipe, Cigarettes    Last attempt  to quit: 07/14/1996    Years since quitting: 21.2  . Smokeless tobacco: Never Used  Substance Use Topics  . Alcohol use: Yes    Alcohol/week: 0.6 oz    Types: 1 Cans of beer per week  . Drug use: No     Allergies   Morphine and related; Oxycodone; and Plavix [clopidogrel]   Review of Systems Review of Systems  Constitutional: Positive for fever.  Respiratory: Negative for shortness of breath.   Cardiovascular: Negative for chest pain.  Gastrointestinal: Negative for abdominal pain.  Genitourinary: Negative for flank pain.  Skin: Positive for color change and wound.  Neurological: Negative for headaches.  All other systems reviewed and are negative.    Physical Exam Updated Vital Signs BP 120/80 (BP Location: Right Arm)   Pulse 95   Temp 98.2 F (36.8 C) (Oral)   Resp 18   Ht 5\' 3"  (1.6 m)   Wt 56.7 kg (125 lb)   SpO2 99%   BMI 22.14 kg/m   Physical Exam  Constitutional: He appears well-developed and well-nourished. No distress.  HENT:  Head: Normocephalic and atraumatic.  Nose: Nose normal.  Mouth/Throat: No oropharyngeal exudate.  Eyes: Pupils are equal, round, and reactive to light. Conjunctivae are normal.  Neck: Normal range of motion. Neck supple.  Cardiovascular: Normal rate, regular rhythm, normal heart sounds and intact distal pulses.  Pulmonary/Chest: Effort normal and breath sounds normal. No stridor. He has no wheezes. He has no rales.  Abdominal: Soft. Bowel sounds are normal. He exhibits no mass. There is no tenderness. There is no rebound and no guarding.  Skin: Capillary refill takes less than 2 seconds. There is erythema.     Psychiatric: He has a normal mood and affect.  Nursing note and vitals reviewed.    ED Treatments / Results  Labs (all labs ordered are listed, but only abnormal results are displayed)  Results for orders placed or performed during the hospital encounter of 10/25/17  CBC with Differential  Result Value Ref Range    WBC 8.7 4.0 - 10.5 K/uL   RBC 4.47 4.22 - 5.81 MIL/uL   Hemoglobin 10.6 (L) 13.0 - 17.0 g/dL   HCT 35.5 (L) 39.0 - 52.0 %   MCV 79.4 78.0 - 100.0 fL   MCH 23.7 (L) 26.0 - 34.0 pg   MCHC 29.9 (L) 30.0 - 36.0 g/dL   RDW 17.5 (H) 11.5 -  15.5 %   Platelets 275 150 - 400 K/uL   Neutrophils Relative % 79 %   Neutro Abs 6.9 1.7 - 7.7 K/uL   Lymphocytes Relative 9 %   Lymphs Abs 0.8 0.7 - 4.0 K/uL   Monocytes Relative 9 %   Monocytes Absolute 0.7 0.1 - 1.0 K/uL   Eosinophils Relative 3 %   Eosinophils Absolute 0.3 0.0 - 0.7 K/uL   Basophils Relative 0 %   Basophils Absolute 0.0 0.0 - 0.1 K/uL  Basic metabolic panel  Result Value Ref Range   Sodium 139 135 - 145 mmol/L   Potassium 4.3 3.5 - 5.1 mmol/L   Chloride 106 101 - 111 mmol/L   CO2 23 22 - 32 mmol/L   Glucose, Bld 175 (H) 65 - 99 mg/dL   BUN 45 (H) 6 - 20 mg/dL   Creatinine, Ser 0.92 0.61 - 1.24 mg/dL   Calcium 9.4 8.9 - 10.3 mg/dL   GFR calc non Af Amer >60 >60 mL/min   GFR calc Af Amer >60 >60 mL/min   Anion gap 10 5 - 15  I-Stat CG4 Lactic Acid, ED  Result Value Ref Range   Lactic Acid, Venous 1.74 0.5 - 1.9 mmol/L   Dg Foot Complete Left  Result Date: 10/25/2017 CLINICAL DATA:  Cellulitis wound above the medial malleolus EXAM: LEFT FOOT - COMPLETE 3+ VIEW COMPARISON:  07/30/2011 FINDINGS: Diffuse osteopenia. Mild sclerosis and irregularity at the head of the fifth metatarsal. No definite periostitis or bony destructive changes. Suggested osseous bridging between the navicular and cuboid with sclerosis and probable partial fusion at the cuneiform cuboidal articulation. These findings do not appear significantly changed. No soft tissue gas. IMPRESSION: 1. Diffuse osteopenia. Possible subacute fracture at the head of the fifth metatarsal. Electronically Signed   By: Donavan Foil M.D.   On: 10/25/2017 23:49    EKG None  Radiology Dg Foot Complete Left  Result Date: 10/25/2017 CLINICAL DATA:  Cellulitis wound above the  medial malleolus EXAM: LEFT FOOT - COMPLETE 3+ VIEW COMPARISON:  07/30/2011 FINDINGS: Diffuse osteopenia. Mild sclerosis and irregularity at the head of the fifth metatarsal. No definite periostitis or bony destructive changes. Suggested osseous bridging between the navicular and cuboid with sclerosis and probable partial fusion at the cuneiform cuboidal articulation. These findings do not appear significantly changed. No soft tissue gas. IMPRESSION: 1. Diffuse osteopenia. Possible subacute fracture at the head of the fifth metatarsal. Electronically Signed   By: Donavan Foil M.D.   On: 10/25/2017 23:49    Procedures Procedures (including critical care time)  Medications Ordered in ED Medications  vancomycin (VANCOCIN) IVPB 1000 mg/200 mL premix (has no administration in time range)  piperacillin-tazobactam (ZOSYN) IVPB 3.375 g (3.375 g Intravenous New Bag/Given 10/25/17 2359)      Final Clinical Impressions(s) / ED Diagnoses   Will admit to medicine due to failure of outpatient management.     Taia Bramlett, MD 10/26/17 [id number]

## 2017-10-26 NOTE — Consult Note (Signed)
[facility] Nurse wound consult note Reason for Consult: lower extremities wounds/cellulitis. Patient per family report has had vascular intervention per Dr. Donzetta Matters last Wednesday with DC to home and his first wound care center appointment on last Friday at which time [facility] Dellia Nims at the [facility] sent patient for direct admission for cellulitis of the LLE.  Wound type: Pressure Injury POA: NA Measurement: medial left malleolar: 2cm x 2cm x 0.1cm; posterior left calf 2 small areas  Wound bed: all wounds are very dry and 100% yellow  Hemosiderin staining on the RLE with no open wounds and no edema.  Drainage (amount, consistency, odor) no dressing in place at the time of my assessment, no drainage noted Periwound:intact with cellulitis that has been marked up to the left inner thigh and the lateral and medial aspects of the left foot.  There has been some decrease in the size of the erythema distally but not on the proximal leg Dressing procedure/placement/frequency: Restart Santyl -enzymatic debridement agent to the wound, only covering wounds so that staff can monitor the other portions of the LLE.   Follow up with wound care center for ulcerations post [place], [place], [place]

## 2017-10-26 NOTE — Progress Notes (Signed)
Patient seen and evaluated, chart reviewed, please see EMR for updated orders. Please see full H&P dictated by admitting physician Dr Tamala Julian for same date of service.   1)Lt LL Cellulitis/infected wounds-failed outpatient oral antibiotic treatment with Keflex, wound care consult appreciated, continue IV vancomycin and Zosyn pending wound and blood cultures\  2)HTN-stable, continue metoprolol and lisinopril,,  may use IV Hydralazine 10 mg  Every 4 hours Prn for systolic blood pressure over 160 mmhg  3)DM-continue to hold metformin, continue sliding scale insulin  4)PAD- s/p Lt SFA stenting by Dr. Gwenlyn Saran on 10/21/2017, apparently patient is intolerant of Plavix, continue aspirin and statin  5) chronic A. Fib-continue Eliquis for anticoagulation and metoprolol for rate control  Roxan Hockey, MD

## 2017-10-26 NOTE — Care Management Note (Signed)
Case Management Note  Patient Details  Name: Logan Bean MRN: [id number] Date of Birth: 07/08/1921  Subjective/Objective:      84 yo admitted with Cellulitis.              Action/Plan: From home with [facility] sitters and [facility] for dressing changes. Will need HHRN orders at discharge if pt is to continue with dressing changes. CM will continue to follow.  Expected Discharge Date:  (unknown)               Expected Discharge Plan:  [place]  In-House Referral:     Discharge planning Services  CM Consult  Post Acute Care Choice:  Resumption of Svcs/PTA Provider Choice offered to:     DME Arranged:    DME Agency:     HH Arranged:  RN [place] Agency:  [facility]  Status of Service:  In process, will continue to follow  If discussed at Long Length of Stay Meetings, dates discussed:    Additional CommentsLynnell Catalan, RN 10/26/2017, 10:25 AM  [phone]

## 2017-10-26 NOTE — H&P (Addendum)
History and Physical    Logan Bean HCW:[id number] DOB: Nov 05, 1920 DOA: 10/25/2017  Referring MD/NP/PA: Dr. Denese Killings PCP: Lauree Chandler, NP  Patient coming from: Home  Chief Complaint: Left leg redness  I have personally briefly reviewed patient's old medical records in [place]   HPI: Logan Bean is a 84 y.o. male with medical history significant of HTN, PVD, arthritis, and varicose veins with venous stasis ulcers; who presents with left lower extremity erythema and warmth.  He had just recently undergone stenting by Dr. Donzetta Matters of the left SFA on 4/10, and was discharged from hospital on 4/11.  At that time the left leg appeared to be improved in color.  However, when he followed up at the wound care center the next day he was noted to have increased redness with warmth particularly around his ulcers.  At that time he was sent to the emergency department for evaluation.  The areas were marked, he was given 1 dose of Ancef IV, given a prescription for oral antibiotics of Keflex, and advised to come back if redness moved outside of the marked areas.  He had been taking the medications as prescribed, but reported redness started going up the back of his thigh yesterday morning.  Other associated symptoms include complaints of a rash on his chest abdomen back after being started on Plavix following his procedure with Dr. Donzetta Matters.  Plavix was thought to be a possible cause and was discontinued and he was started on aspirin.  Patient still reports having intermittent itching for which he has been taking Benadryl with some mild relief.  ED Course: Upon admission into the emergency department patient was noted to be afebrile with vital signs relatively within normal limits.  Labs appeared near patient's baseline.  Blood cultures were obtained and patient was started on empiric antibiotics of vancomycin and Zosyn.  X-rays of the left foot showed possible subacute fifth metatarsal fracture.   TRH called to admit.  Review of Systems  Constitutional: Negative for chills and fever.  HENT: Negative for congestion and nosebleeds.   Eyes: Negative for photophobia and pain.  Respiratory: Negative for cough and shortness of breath.   Cardiovascular: Positive for leg swelling. Negative for chest pain.  Gastrointestinal: Negative for abdominal pain, nausea and vomiting.  Genitourinary: Negative for dysuria and hematuria.  Musculoskeletal: Negative for falls and neck pain.  Skin: Positive for itching and rash.  Neurological: Negative for focal weakness and loss of consciousness.  Psychiatric/Behavioral: Negative for memory loss and substance abuse.    Past Medical History:  Diagnosis Date  . Anxiety   . Arthritis    "probably all over" (10/21/2017)  . Depression   . Diaphragmatic hernia without mention of obstruction or gangrene   . Dizziness and giddiness   . Edema   . GERD (gastroesophageal reflux disease)   . H/O hiatal hernia   . History of gout   . History of kidney stones   . History of stomach ulcers   . Hypercholesteremia   . Hypertension   . Left rotator cuff tear Jul 12, 2012  . PVD (peripheral vascular disease) ([place])   . Stomach ulcer    years  . Type II diabetes mellitus ([place])   . Unspecified hereditary and idiopathic peripheral neuropathy   . Unspecified vitamin D deficiency   . Varicose veins    both legs    Past Surgical History:  Procedure Laterality Date  . ABDOMINAL AORTOGRAM W/LOWER EXTREMITY N/A 10/21/2017  Procedure: ABDOMINAL AORTOGRAM W/LOWER EXTREMITY - Left;  Surgeon: Waynetta Sandy, MD;  Location: [facility];  Service: Cardiovascular;  Laterality: N/A;  . CARDIOVERSION N/A 02/22/2013   Procedure: CARDIOVERSION;  Surgeon: Darlin Coco, MD;  Location: [facility];  Service: Cardiovascular;  Laterality: N/A;  . CATARACT EXTRACTION W/ INTRAOCULAR LENS  IMPLANT, BILATERAL Bilateral   . ESOPHAGEAL DILATION  2003 and 3 yrs  ago  . FRACTURE SURGERY    . MASTECTOMY     bilateral  . ORIF ANKLE FRACTURE Right 09/20/2012   Procedure: OPEN REDUCTION INTERNAL FIXATION (ORIF) ANKLE FRACTURE;  Surgeon: Johnn Hai, MD;  Location: [facility];  Service: Orthopedics;  Laterality: Right;  . PERIPHERAL VASCULAR INTERVENTION Left 10/21/2017  . PERIPHERAL VASCULAR INTERVENTION Left 10/21/2017   Procedure: PERIPHERAL VASCULAR INTERVENTION;  Surgeon: Waynetta Sandy, MD;  Location: [facility];  Service: Cardiovascular;  Laterality: Left;  . REVERSE SHOULDER ARTHROPLASTY Right 09/17/2012   Procedure: REVERSE SHOULDER ARTHROPLASTY;  Surgeon: Augustin Schooling, MD;  Location: [facility];  Service: Orthopedics;  Laterality: Right;  . SHOULDER OPEN ROTATOR CUFF REPAIR  11/05/2011   Procedure: ROTATOR CUFF REPAIR SHOULDER OPEN;  Surgeon: Tobi Bastos, MD;  Location: [facility];  Service: Orthopedics;  Laterality: Left;  Left Shoulder Rotator Cuff Repair Open with Graft and Anchors  . TONSILLECTOMY  age 41     reports that he quit smoking about 21 years ago. His smoking use included pipe and cigarettes. He quit after 50.00 years of use. He has never used smokeless tobacco. He reports that he drinks about 0.6 oz of alcohol per week. He reports that he does not use drugs.  Allergies  Allergen Reactions  . Morphine And Related Nausea And Vomiting  . Oxycodone Nausea And Vomiting  . Plavix [Clopidogrel]     Rash on upper body    Family History  Problem Relation Age of Onset  . Parkinson's disease Mother   . Hip fracture Mother   . Heart disease Mother   . Alzheimer's disease Mother   . Heart disease Father   . Parkinson's disease Sister   . Heart disease Brother     Prior to Admission medications   Medication Sig Start Date End Date Taking? Authorizing Provider  acetaminophen (TYLENOL) 500 MG tablet Take 500 mg by mouth every 6 (six) hours as needed for mild pain, moderate pain or headache.    Yes [provider]  aspirin EC 81 MG tablet Take 162 mg by mouth 2 (two) times daily.    Yes [provider]  Calcium Carbonate-Vitamin D 600-200 MG-UNIT TABS Take 1 tablet by mouth 2 (two) times daily. Take 1 tablet daily for vitamin supplement.   Yes [provider]  cephALEXin (KEFLEX) 500 MG capsule Take 1 capsule (500 mg total) by mouth 3 (three) times daily for 7 days. 10/23/17 10/30/17 Yes Volanda Napoleon, PA-C  diphenhydrAMINE (BENADRYL ALLERGY) 25 MG tablet Take 25 mg by mouth at bedtime as needed (rash due to Plavix).   Yes [provider]  ELIQUIS 2.5 MG TABS tablet TAKE 1 TABLET BY MOUTH TWICE DAILY. 06/11/17  Yes Lauree Chandler, NP  ferrous sulfate 325 (65 FE) MG tablet Take 325 mg by mouth 2 (two) times a week. Takes one tablet by mouth every three days    Yes [provider]  furosemide (LASIX) 20 MG tablet TAKE (1/2) TABLET ONCE A DAY FOR EDEMA, HYPERTENSION. Patient taking differently: take  10-20 mg by mouth daily as needed for fluid 12/23/16  Yes Eubanks, Carlos American, NP  Glucosamine-Chondroit-Vit C-Mn (GLUCOSAMINE 1500 COMPLEX) CAPS Take one tablet once a day 01/12/13  Yes Bubba Camp, Mahima, MD  glucose blood (TRUETEST TEST) test strip Use to check blood sugar once daily Dx:E11.22 09/16/17  Yes Eubanks, Carlos American, NP  lisinopril (PRINIVIL,ZESTRIL) 10 MG tablet TAKE 1 TABLET BY MOUTH DAILY. 06/11/17  Yes Lauree Chandler, NP  metFORMIN (GLUCOPHAGE) 500 MG tablet TAKE 1 TABLET BY MOUTH TWICE DAILY WITH A MEAL. 06/11/17  Yes Lauree Chandler, NP  metoprolol tartrate (LOPRESSOR) 25 MG tablet TAKE 1 TABLET BY MOUTH TWICE DAILY. 06/11/17  Yes Lauree Chandler, NP  Multiple Vitamin (MULITIVITAMIN WITH MINERALS) TABS Take 1 tablet by mouth daily.    Yes [provider]  Omega-3 Fatty Acids (FISH OIL) 1000 MG CAPS Take 1,000 mg by mouth daily.    Yes [provider]  Probiotic Product (PROBIOTIC PO) Take 1 tablet by mouth daily.   Yes [provider]  simvastatin (ZOCOR) 10 MG tablet TAKE ONE TABLET AT BEDTIME. Patient taking differently: TAKE ONE TABLET (10 mg)  AT BEDTIME. 06/11/17  Yes Eubanks, Carlos American, NP  TRUEPLUS LANCETS 30G MISC Use to check blood sugar once daily. Dx E11.9 06/13/15  Yes Lauree Chandler, NP  clopidogrel (PLAVIX) 75 MG tablet Take 1 tablet (75 mg total) by mouth daily with breakfast. 10/22/17   Gabriel Earing, PA-C    Physical Exam:  Constitutional: Elderly male in NAD, calm, comfortable Vitals:   10/25/17 1938 10/25/17 2154 10/26/17 0030 10/26/17 0130  BP:  120/80 135/86 (!) 120/58  Pulse:  95    Resp:  18 (!) 24 16  Temp:      TempSrc:      SpO2:  99%    Weight: 56.7 kg (125 lb)     Height: 5\' 3"  (1.6 m)      Eyes: PERRL, lids and conjunctivae normal ENMT: Mucous membranes are moist. Posterior pharynx clear of any exudate or lesions.  Neck: normal, supple, no masses, no thyromegaly Respiratory: clear to auscultation bilaterally, no wheezing, no crackles. Normal respiratory effort. No accessory muscle use.  Cardiovascular: Regular rate and rhythm, no murmurs / rubs / gallops.  Trace lower extremity edema of the left leg. 2+ pedal pulses. No carotid bruits.  Abdomen: no tenderness, no masses palpated. No hepatosplenomegaly. Bowel sounds positive.  Musculoskeletal: no clubbing / cyanosis. No joint deformity upper and lower extremities. Good ROM, no contractures. Normal muscle tone.  Skin: Venous stasis ulcers present with erythema increased warmth of the left leg will go up to the posterior thigh. Neurologic: CN 2-12 grossly intact. Sensation intact, DTR normal. Strength 5/5 in all 4.  Psychiatric: Normal judgment and insight. Alert and oriented x 3. Normal mood.     Labs on Admission: I have personally reviewed following labs and imaging studies  CBC: Recent Labs  Lab 10/21/17 0857 10/22/17 0356 10/23/17 1513 10/25/17 2245  WBC  --  7.8 9.3 8.7  NEUTROABS  --   --  7.6 6.9    HGB 11.2* 9.3* 10.0* 10.6*  HCT 33.0* 30.3* 32.9* 35.5*  MCV  --  77.9* 78.1 79.4  PLT  --  228 232 169   Basic Metabolic Panel: Recent Labs  Lab 10/21/17 0857 10/22/17 0356 10/23/17 1513 10/25/17 2245  NA 137 138 138 139  K 4.6 4.7 4.4 4.3  CL 105 106 105 106  CO2  --  25 23 23   GLUCOSE 176* 140* 161* 175*  BUN 36* 25* 35* 45*  CREATININE 0.80 0.83 1.10 0.92  CALCIUM  --  8.7* 8.8* 9.4   GFR: Estimated Creatinine Clearance: 38.5 mL/min (by C-G formula based on SCr of 0.92 mg/dL). Liver Function Tests: No results for input(s): AST, ALT, ALKPHOS, BILITOT, PROT, ALBUMIN in the last 168 hours. No results for input(s): LIPASE, AMYLASE in the last 168 hours. No results for input(s): AMMONIA in the last 168 hours. Coagulation Profile: No results for input(s): INR, PROTIME in the last 168 hours. Cardiac Enzymes: No results for input(s): CKTOTAL, CKMB, CKMBINDEX, TROPONINI in the last 168 hours. BNP (last 3 results) No results for input(s): PROBNP in the last 8760 hours. HbA1C: No results for input(s): HGBA1C in the last 72 hours. CBG: Recent Labs  Lab 10/21/17 1228 10/21/17 1729 10/21/17 2152 10/22/17 0704  GLUCAP 184* 260* 161* 157*   Lipid Profile: No results for input(s): CHOL, HDL, LDLCALC, TRIG, CHOLHDL, LDLDIRECT in the last 72 hours. Thyroid Function Tests: No results for input(s): TSH, T4TOTAL, FREET4, T3FREE, THYROIDAB in the last 72 hours. Anemia Panel: No results for input(s): VITAMINB12, FOLATE, FERRITIN, TIBC, IRON, RETICCTPCT in the last 72 hours. Urine analysis:    Component Value Date/Time   COLORURINE YELLOW 04/26/2015 2110   APPEARANCEUR CLEAR 04/26/2015 2110   LABSPEC 1.018 04/26/2015 2110   PHURINE 6.0 04/26/2015 2110   GLUCOSEU 250 (A) 04/26/2015 2110   HGBUR TRACE (A) 04/26/2015 2110   BILIRUBINUR NEGATIVE 04/26/2015 2110   KETONESUR NEGATIVE 04/26/2015 2110   PROTEINUR NEGATIVE 04/26/2015 2110   UROBILINOGEN 0.2 04/26/2015 2110    NITRITE NEGATIVE 04/26/2015 2110   LEUKOCYTESUR NEGATIVE 04/26/2015 2110   Sepsis Labs: No results found for this or any previous visit (from the past 240 hour(s)).   Radiological Exams on Admission: Dg Foot Complete Left  Result Date: 10/25/2017 CLINICAL DATA:  Cellulitis wound above the medial malleolus EXAM: LEFT FOOT - COMPLETE 3+ VIEW COMPARISON:  07/30/2011 FINDINGS: Diffuse osteopenia. Mild sclerosis and irregularity at the head of the fifth metatarsal. No definite periostitis or bony destructive changes. Suggested osseous bridging between the navicular and cuboid with sclerosis and probable partial fusion at the cuneiform cuboidal articulation. These findings do not appear significantly changed. No soft tissue gas. IMPRESSION: 1. Diffuse osteopenia. Possible subacute fracture at the head of the fifth metatarsal. Electronically Signed   By: Donavan Foil M.D.   On: 10/25/2017 23:49    X-rays independently reviewed: showing diffuse osteopenia.   Assessment/Plan Cellulitis of left leg, chronic venous stasis ulcer: Acute.  Patient was treated as an outpatient with Keflex ,but symptoms progressively worsened up the thigh of his left leg.  - Admit to a MedSurg bed - Follow-up blood culture - Continue empiric antibiotics of vancomycin and Zosyn - Wound care consult  Question subacute fracture of the fifth metatarsal: Incidental finding on x-ray of left foot.  Patient denies any significant pain or recent trauma.  Essential hypertension - Continue metoprolol and lisinopril  Peripheral arterial disease: Patient status post stenting of the left SFA by Dr. Donzetta Matters on 4/10. - Continue aspirin  Chronic atrial fibrillation - Continue Eliquis and metoprolol  Diabetes mellitus type 2: Last known hemoglobin A1c noted to be 7.4 on 08/05/2017. - Hypoglycemic protocols - Hold metformin  - CBGs with sensitive SSI  Pruritic rash - Continue Benadryl prn itching   DVT prophylaxis: Eliquis Code  Status: Full  Family Communication: Discussed  plan of care with the patient and friend present at bedside Disposition Plan: Likely discharge home once medically stable Consults called: None Admission status: Inpatient  Norval Morton MD [facility] Pager [phone]   If 7PM-7AM, please contact night-coverage [email or web address] Password [email or web address]  10/26/2017, 2:09 AM

## 2017-10-26 NOTE — Progress Notes (Signed)
Pharmacy Antibiotic Note  Logan Bean is a 84 y.o. male admitted on 10/25/2017 with cellulitis.  Pharmacy has been consulted for Vancomycin dosing.  Plan: Zosyn 3.375g IV q8h (4 hour infusion).  Vancomycin 1gm iv x1, then 750mg  iv q36hr  Goal AUC = 400 - 500 for all indications, except meningitis (goal AUC > 500 and Cmin 15-20 mcg/mL)    Height: 5\' 3"  (160 cm) Weight: 125 lb (56.7 kg) IBW/kg (Calculated) : 56.9  Temp (24hrs), Avg:98.2 F (36.8 C), Min:98.2 F (36.8 C), Max:98.2 F (36.8 C)  Recent Labs  Lab 10/21/17 0857 10/22/17 0356 10/23/17 1513 10/25/17 2245 10/25/17 2259  WBC  --  7.8 9.3 8.7  --   CREATININE 0.80 0.83 1.10 0.92  --   LATICACIDVEN  --   --   --   --  1.74    Estimated Creatinine Clearance: 38.5 mL/min (by C-G formula based on SCr of 0.92 mg/dL).    Allergies  Allergen Reactions  . Morphine And Related Nausea And Vomiting  . Oxycodone Nausea And Vomiting  . Plavix [Clopidogrel]     Rash on upper body    Antimicrobials this admission: Vancomycin 10/26/2017 >> Zosyn 10/26/2017 >>   Dose adjustments this admission: -  Microbiology results: -  Thank you for allowing pharmacy to be a part of this patient's care.  Nani Skillern Crowford 10/26/2017 3:54 AM

## 2017-10-27 LAB — GLUCOSE, CAPILLARY
GLUCOSE-CAPILLARY: 170 mg/dL — AB (ref 65–99)
GLUCOSE-CAPILLARY: 100 mg/dL — AB (ref 65–99)
GLUCOSE-CAPILLARY: 195 mg/dL — AB (ref 65–99)
Glucose-Capillary: 59 mg/dL — ABNORMAL LOW (ref 65–99)
Glucose-Capillary: 125 mg/dL — ABNORMAL HIGH (ref 65–99)
Glucose-Capillary: 168 mg/dL — ABNORMAL HIGH (ref 65–99)

## 2017-10-27 MED ORDER — FLORANEX PO PACK
1.0000 g | PACK | Freq: Three times a day (TID) | ORAL | Status: DC
  Administered 2017-10-27 – 2017-10-28 (×2): 1 g via ORAL
  Filled 2017-10-27 (×4): qty 1

## 2017-10-27 NOTE — Progress Notes (Signed)
Patient Demographics:    Logan Bean, is a 84 y.o. male, DOB - May 24, 1921, IZT:[id number]  Admit date - 10/25/2017   Admitting Physician Norval Morton, MD  Outpatient Primary MD for the patient is [name], Carlos American, NP  LOS - 1   Chief Complaint  Patient presents with  . Recurrent Skin Infections        Subjective:    Logan Bean today has no fevers, no emesis,  No chest pain, no new complaints, no significant leg pains,  Assessment  & Plan :    Principal Problem:   Cellulitis Active Problems:   Essential hypertension   Chronic atrial fibrillation (HCC)   Type 2 diabetes mellitus with diabetic chronic kidney disease (HCC)   Venous ulcer of ankle (HCC)   PAD (peripheral artery disease) ([place])   Plan:- 1)Lt LL Cellulitis/infected wounds-failed outpatient oral antibiotic treatment with Keflex, wound care consult appreciated, overall improving with  IV vancomycin and Zosyn pending wound and blood cultures  2)HTN-stable, continue metoprolol and lisinopril,,  may use IV Hydralazine 10 mg  Every 4 hours Prn for systolic blood pressure over 160 mmhg  3)DM- stable, last A1C was 7.4,  continue to hold metformin, continue sliding scale insulin,   4)PAD- s/p Lt SFA stenting by Dr. Donzetta Matters on 10/21/2017, apparently patient is intolerant of Plavix, continue aspirin and statin  5) chronic A. Fib-continue Eliquis for anticoagulation and metoprolol for rate control  6)Social----  D/w POA and caregiver....  Logan Bean ----  [phone], plan of care reviewed  Code Status : DNR  Disposition Plan  : Home with [facility] on 10/28/17   DVT Prophylaxis  :  Eliquis  Lab Results  Component Value Date   PLT 218 10/26/2017    Inpatient Medications  Scheduled Meds: . acidophilus  1 capsule Oral Daily  . apixaban  2.5 mg Oral BID  . aspirin EC  162 mg Oral BID  . collagenase   Topical Daily  .  ferrous sulfate  325 mg Oral Once per day on Mon Thu  . insulin aspart  0-9 Units Subcutaneous TID WC  . lisinopril  10 mg Oral Daily  . metoprolol tartrate  25 mg Oral BID  . simvastatin  10 mg Oral QHS   Continuous Infusions: . piperacillin-tazobactam (ZOSYN)  IV Stopped (10/27/17 0917)  . vancomycin 750 mg (10/27/17 1049)   PRN Meds:.acetaminophen **OR** acetaminophen, albuterol, diphenhydrAMINE, hydrALAZINE, ondansetron **OR** ondansetron (ZOFRAN) IV    Anti-infectives (From admission, onward)   Start     Dose/Rate Route Frequency Ordered Stop   10/27/17 0800  vancomycin (VANCOCIN) IVPB 750 mg/150 ml premix     750 mg 150 mL/hr over 60 Minutes Intravenous Every 36 hours 10/26/17 0354     10/26/17 2200  vancomycin (VANCOCIN) IVPB 1000 mg/200 mL premix  Status:  Discontinued     1,000 mg 200 mL/hr over 60 Minutes Intravenous Every 24 hours 10/26/17 0241 10/26/17 0302   10/26/17 0600  piperacillin-tazobactam (ZOSYN) IVPB 3.375 g  Status:  Discontinued     3.375 g 100 mL/hr over 30 Minutes Intravenous Every 8 hours 10/26/17 0241 10/26/17 0300   10/26/17 0600  piperacillin-tazobactam (ZOSYN) IVPB 3.375 g     3.375 g 12.5 mL/hr  over 240 Minutes Intravenous Every 8 hours 10/26/17 0301     10/25/17 2315  vancomycin (VANCOCIN) IVPB 1000 mg/200 mL premix     1,000 mg 200 mL/hr over 60 Minutes Intravenous  Once 10/25/17 2310 10/26/17 0210   10/25/17 2315  piperacillin-tazobactam (ZOSYN) IVPB 3.375 g     3.375 g 100 mL/hr over 30 Minutes Intravenous  Once 10/25/17 2310 10/26/17 0030        Objective:   Vitals:   10/26/17 2135 10/27/17 0550 10/27/17 1057 10/27/17 1214  BP: 137/70 (!) 122/56 116/63 113/66  Pulse: 100 79 81 86  Resp: 20 20  16   Temp: 98.7 F (37.1 C) 98.2 F (36.8 C)  97.9 F (36.6 C)  TempSrc: Oral Oral  Oral  SpO2: 97% 96%  98%  Weight:      Height:        Wt Readings from Last 3 Encounters:  10/25/17 56.7 kg (125 lb)  10/23/17 56.7 kg (125 lb)    10/22/17 57 kg (125 lb 10.6 oz)     Intake/Output Summary (Last 24 hours) at 10/27/2017 1252 Last data filed at 10/27/2017 1100 Gross per 24 hour  Intake 480 ml  Output 1450 ml  Net -970 ml     Physical Exam  Gen:- Awake Alert,  In no apparent distress  HEENT:- [place].AT, No sclera icterus Neck-Supple Neck,No JVD,.  Lungs-  CTAB , good air movement CV- S1, S2 normal Abd-  +ve B.Sounds, Abd Soft, No tenderness,    Extremity/Skin:-Left lower extremity open wounds with scant drainage , improving erythema, improving redness, improving wound, improving swelling and improving tenderness overall  psych-affect is appropriate, oriented x3 Neuro-no new focal deficits, no tremors   Data Review:   Micro Results Recent Results (from the past 240 hour(s))  Aerobic Culture (superficial specimen)     Status: None (Preliminary result)   Collection Time: 10/26/17  9:40 AM  Result Value Ref Range Status   Specimen Description   Final    WOUND LEFT LEG Performed at [facility] [address], [place], [place] [address]    Special Requests   Final    Normal Performed at [facility], [place] [address], [place], [place] [address]    Gram Stain   Final    NO WBC SEEN NO ORGANISMS SEEN Performed at [facility], [place] [address] [place], [place] [address]    Culture PENDING  Incomplete   Report Status PENDING  Incomplete    Radiology Reports Dg Foot Complete Left  Result Date: 10/25/2017 CLINICAL DATA:  Cellulitis wound above the medial malleolus EXAM: LEFT FOOT - COMPLETE 3+ VIEW COMPARISON:  07/30/2011 FINDINGS: Diffuse osteopenia. Mild sclerosis and irregularity at the head of the fifth metatarsal. No definite periostitis or bony destructive changes. Suggested osseous bridging between the navicular and cuboid with sclerosis and probable partial fusion at the cuneiform cuboidal articulation. These findings do not appear significantly changed. No  soft tissue gas. IMPRESSION: 1. Diffuse osteopenia. Possible subacute fracture at the head of the fifth metatarsal. Electronically Signed   By: Donavan Foil M.D.   On: 10/25/2017 23:49     CBC Recent Labs  Lab 10/21/17 0857 10/22/17 0356 10/23/17 1513 10/25/17 2245 10/26/17 0410  WBC  --  7.8 9.3 8.7 8.2  HGB 11.2* 9.3* 10.0* 10.6* 9.4*  HCT 33.0* 30.3* 32.9* 35.5* 31.2*  PLT  --  228 232 275 218  MCV  --  77.9* 78.1  79.4 76.8*  MCH  --  23.9* 23.8* 23.7* 23.2*  MCHC  --  30.7 30.4 29.9* 30.1  RDW  --  16.9* 17.1* 17.5* 17.2*  LYMPHSABS  --   --  0.5* 0.8  --   MONOABS  --   --  1.0 0.7  --   EOSABS  --   --  0.2 0.3  --   BASOSABS  --   --  0.0 0.0  --     Chemistries  Recent Labs  Lab 10/21/17 0857 10/22/17 0356 10/23/17 1513 10/25/17 2245 10/26/17 0410  NA 137 138 138 139 136  K 4.6 4.7 4.4 4.3 4.8  CL 105 106 105 106 107  CO2  --  25 23 23  18*  GLUCOSE 176* 140* 161* 175* 161*  BUN 36* 25* 35* 45* 40*  CREATININE 0.80 0.83 1.10 0.92 0.95  CALCIUM  --  8.7* 8.8* 9.4 8.7*   ------------------------------------------------------------------------------------------------------------------ No results for input(s): CHOL, HDL, LDLCALC, TRIG, CHOLHDL, LDLDIRECT in the last 72 hours.  Lab Results  Component Value Date   HGBA1C 7.4 (H) 08/05/2017   ------------------------------------------------------------------------------------------------------------------ No results for input(s): TSH, T4TOTAL, T3FREE, THYROIDAB in the last 72 hours.  Invalid input(s): FREET3 ------------------------------------------------------------------------------------------------------------------ No results for input(s): VITAMINB12, FOLATE, FERRITIN, TIBC, IRON, RETICCTPCT in the last 72 hours.  Coagulation profile No results for input(s): INR, PROTIME in the last 168 hours.  No results for input(s): DDIMER in the last 72 hours.  Cardiac Enzymes No results for input(s): CKMB,  TROPONINI, MYOGLOBIN in the last 168 hours.  Invalid input(s): CK ------------------------------------------------------------------------------------------------------------------ No results found for: BNP   Roxan Hockey M.D on 10/27/2017 at 12:52 PM  Between 7am to 7pm - Pager - [phone]  After 7pm go to [email or web address] - password [email or web address]  [facility] -  Office  [phone]   Voice Recognition Viviann Spare dictation system was used to create this note, attempts have been made to correct errors. Please contact the author with questions and/or clarifications.

## 2017-10-28 LAB — GLUCOSE, CAPILLARY
GLUCOSE-CAPILLARY: 129 mg/dL — AB (ref 65–99)
GLUCOSE-CAPILLARY: 183 mg/dL — AB (ref 65–99)
Glucose-Capillary: 138 mg/dL — ABNORMAL HIGH (ref 65–99)

## 2017-10-28 LAB — CBC
HEMATOCRIT: 28.1 % — AB (ref 39.0–52.0)
HEMOGLOBIN: 8.6 g/dL — AB (ref 13.0–17.0)
MCH: 23.8 pg — AB (ref 26.0–34.0)
MCHC: 30.6 g/dL (ref 30.0–36.0)
MCV: 77.6 fL — ABNORMAL LOW (ref 78.0–100.0)
Platelets: 213 10*3/uL (ref 150–400)
RBC: 3.62 MIL/uL — AB (ref 4.22–5.81)
RDW: 17.4 % — ABNORMAL HIGH (ref 11.5–15.5)
WBC: 9.8 10*3/uL (ref 4.0–10.5)

## 2017-10-28 LAB — BASIC METABOLIC PANEL
Anion gap: 9 (ref 5–15)
BUN: 29 mg/dL — AB (ref 6–20)
CHLORIDE: 108 mmol/L (ref 101–111)
CO2: 20 mmol/L — AB (ref 22–32)
Calcium: 8.6 mg/dL — ABNORMAL LOW (ref 8.9–10.3)
Creatinine, Ser: 1.03 mg/dL (ref 0.61–1.24)
GFR calc non Af Amer: 60 mL/min — ABNORMAL LOW (ref >60)
Glucose, Bld: 136 mg/dL — ABNORMAL HIGH (ref 65–99)
POTASSIUM: 4.4 mmol/L (ref 3.5–5.1)
Sodium: 137 mmol/L (ref 135–145)

## 2017-10-28 LAB — AEROBIC CULTURE W GRAM STAIN (SUPERFICIAL SPECIMEN)
Gram Stain: NONE SEEN
Special Requests: NORMAL

## 2017-10-28 LAB — AEROBIC CULTURE  (SUPERFICIAL SPECIMEN)

## 2017-10-28 MED ORDER — PIPERACILLIN-TAZOBACTAM 3.375 G IVPB 30 MIN
3.3750 g | Freq: Once | INTRAVENOUS | Status: AC
  Administered 2017-10-28: 3.375 g via INTRAVENOUS
  Filled 2017-10-28: qty 50

## 2017-10-28 MED ORDER — FEXOFENADINE HCL 180 MG PO TABS: 180.0000 mg | ORAL_TABLET | Freq: Every day | ORAL | 0 refills | Status: DC

## 2017-10-28 MED ORDER — ACETAMINOPHEN 325 MG PO TABS: 650.0000 mg | ORAL_TABLET | Freq: Four times a day (QID) | ORAL | 0 refills | Status: DC | PRN

## 2017-10-28 MED ORDER — COLLAGENASE 250 UNIT/GM EX OINT: TOPICAL_OINTMENT | Freq: Every day | CUTANEOUS | 0 refills | Status: DC

## 2017-10-28 MED ORDER — ONDANSETRON HCL 4 MG PO TABS: 4.0000 mg | ORAL_TABLET | Freq: Four times a day (QID) | ORAL | 0 refills | Status: DC | PRN

## 2017-10-28 MED ORDER — CEFDINIR 300 MG PO CAPS: 300.0000 mg | ORAL_CAPSULE | Freq: Two times a day (BID) | ORAL | 0 refills | Status: DC

## 2017-10-28 MED ORDER — SULFAMETHOXAZOLE-TRIMETHOPRIM 800-160 MG PO TABS: 1.0000 | ORAL_TABLET | Freq: Two times a day (BID) | ORAL | 0 refills | Status: DC

## 2017-10-28 MED ORDER — PIPERACILLIN-TAZOBACTAM 3.375 G IVPB
3.3750 g | Freq: Three times a day (TID) | INTRAVENOUS | Status: DC
  Filled 2017-10-28: qty 50

## 2017-10-28 MED ORDER — DOXYCYCLINE HYCLATE 100 MG PO TABS: 100.0000 mg | ORAL_TABLET | Freq: Two times a day (BID) | ORAL | 0 refills | Status: DC

## 2017-10-28 MED ORDER — FUROSEMIDE 20 MG PO TABS: ORAL_TABLET | ORAL | 3 refills | Status: AC

## 2017-10-28 MED ORDER — FLORANEX PO PACK: 1.0000 g | PACK | Freq: Three times a day (TID) | ORAL | 1 refills | Status: DC

## 2017-10-28 NOTE — Discharge Summary (Signed)
Logan Bean, is a 84 y.o. male  DOB Jan 19, 1921  MRN [id number].  Admission date:  10/25/2017  Admitting Physician  Norval Morton, MD  Discharge Date:  10/28/2017   Primary MD  Lauree Chandler, NP  Recommendations for primary care physician for things to follow:   1) take Bactrim/Septra antibiotic as prescribed for  leg and foot infections 2) take probiotic and yogurt to reduce risk of antibiotic related diarrhea 3) nurse to come to the house for wound changes and dressing 4) follow-up with vascular surgeon Dr. Donzetta Matters next week as previously advised 5)Avoid ibuprofen/Advil/Aleve/Motrin/Goody Powders/Naproxen/BC powders/Meloxicam/Diclofenac/Indomethacin and other Nonsteroidal anti-inflammatory medications as these will make you more likely to bleed and can cause stomach ulcers, can also cause Kidney problems.  6)Do NOT Take Lasix/Furosemide-while you are taking Bactrim antibiotic 7)You have Infected postop wound with cellulitis with MRSA bacteria 8)Repeat BMP/kidney blood test in 1 week 9)Do NOT Take Lisinopril-while you are taking Bactrim antibiotic, May Restart Lisinopril for Blood Pressure after 11/08/17 to reduce risk of kidney complications  Admission Diagnosis  Cellulitis of other specified site [L03.818] Cellulitis [L03.90]   Discharge Diagnosis  Cellulitis of other specified site [L03.818] Cellulitis [L03.90]    Principal Problem:   Cellulitis Active Problems:   Essential hypertension   Chronic atrial fibrillation (HCC)   Type 2 diabetes mellitus with diabetic chronic kidney disease ([place])   Venous ulcer of ankle (HCC)   PAD (peripheral artery disease) ([place])      Past Medical History:  Diagnosis Date  . Anxiety   . Arthritis    "probably all over" (10/21/2017)  . Depression   . Diaphragmatic hernia without mention of obstruction or gangrene   . Dizziness and giddiness   . Edema     . GERD (gastroesophageal reflux disease)   . H/O hiatal hernia   . History of gout   . History of kidney stones   . History of stomach ulcers   . Hypercholesteremia   . Hypertension   . Left rotator cuff tear Jul 12, 2012  . PVD (peripheral vascular disease) ([place])   . Stomach ulcer    years  . Type II diabetes mellitus ([place])   . Unspecified hereditary and idiopathic peripheral neuropathy   . Unspecified vitamin D deficiency   . Varicose veins    both legs    Past Surgical History:  Procedure Laterality Date  . ABDOMINAL AORTOGRAM W/LOWER EXTREMITY N/A 10/21/2017   Procedure: ABDOMINAL AORTOGRAM W/LOWER EXTREMITY - Left;  Surgeon: Waynetta Sandy, MD;  Location: [facility];  Service: Cardiovascular;  Laterality: N/A;  . CARDIOVERSION N/A 02/22/2013   Procedure: CARDIOVERSION;  Surgeon: Darlin Coco, MD;  Location: [facility];  Service: Cardiovascular;  Laterality: N/A;  . CATARACT EXTRACTION W/ INTRAOCULAR LENS  IMPLANT, BILATERAL Bilateral   . ESOPHAGEAL DILATION  2003 and 3 yrs ago  . FRACTURE SURGERY    . MASTECTOMY     bilateral  . ORIF ANKLE FRACTURE Right 09/20/2012   Procedure: OPEN REDUCTION INTERNAL  FIXATION (ORIF) ANKLE FRACTURE;  Surgeon: Johnn Hai, MD;  Location: [facility];  Service: Orthopedics;  Laterality: Right;  . PERIPHERAL VASCULAR INTERVENTION Left 10/21/2017  . PERIPHERAL VASCULAR INTERVENTION Left 10/21/2017   Procedure: PERIPHERAL VASCULAR INTERVENTION;  Surgeon: Waynetta Sandy, MD;  Location: [facility];  Service: Cardiovascular;  Laterality: Left;  . REVERSE SHOULDER ARTHROPLASTY Right 09/17/2012   Procedure: REVERSE SHOULDER ARTHROPLASTY;  Surgeon: Augustin Schooling, MD;  Location: [facility];  Service: Orthopedics;  Laterality: Right;  . SHOULDER OPEN ROTATOR CUFF REPAIR  11/05/2011   Procedure: ROTATOR CUFF REPAIR SHOULDER OPEN;  Surgeon: Tobi Bastos, MD;  Location: [facility];  Service: Orthopedics;  Laterality: Left;   Left Shoulder Rotator Cuff Repair Open with Graft and Anchors  . TONSILLECTOMY  age 60       HPI  from the history and physical done on the day of admission:     HPI: Logan Bean is a 84 y.o. male with medical history significant of HTN, PVD, arthritis, and varicose veins with venous stasis ulcers; who presents with left lower extremity erythema and warmth.  He had just recently undergone stenting by Dr. Donzetta Matters of the left SFA on 4/10, and was discharged from hospital on 4/11.  At that time the left leg appeared to be improved in color.  However, when he followed up at the wound care center the next day he was noted to have increased redness with warmth particularly around his ulcers.  At that time he was sent to the emergency department for evaluation.  The areas were marked, he was given 1 dose of Ancef IV, given a prescription for oral antibiotics of Keflex, and advised to come back if redness moved outside of the marked areas.  He had been taking the medications as prescribed, but reported redness started going up the back of his thigh yesterday morning.  Other associated symptoms include complaints of a rash on his chest abdomen back after being started on Plavix following his procedure with Dr. Donzetta Matters.  Plavix was thought to be a possible cause and was discontinued and he was started on aspirin.  Patient still reports having intermittent itching for which he has been taking Benadryl with some mild relief.  ED Course: Upon admission into the emergency department patient was noted to be afebrile with vital signs relatively within normal limits.  Labs appeared near patient's baseline.  Blood cultures were obtained and patient was started on empiric antibiotics of vancomycin and Zosyn.  X-rays of the left foot showed possible subacute fifth metatarsal fracture.  [facility] called to admit.    Hospital Course:    Plan:- 1)Lt LL MRSA Cellulitis/infected wounds-failed outpatient oral antibiotic treatment  with Keflex, wound care consult appreciated, overall improving with  IV vancomycin and Zosyn, admission wound cultures with MRSA, discussed with Dr.Comer , from infectious disease service, sensitivities of the MRSA reviewed, Dr.Comer advises discharge home on Bactrim double strength for about a week.  Will hold Lasix, lisinopril and other nephrotoxic agents while on Bactrim  2)HTN-stable,continue metoprolol,  Hold lisinopril while on Bactrim ,,  3)DM- stable, last A1C was 7.4, restart metformin, continue sliding scale insulin,   4)PAD- s/p Lt SFAstenting by Dr. Donzetta Matters on 10/21/2017, apparently patient is intolerant of Plavix, continue aspirin and statin  5)chronic A. Fib-continue Eliquis for anticoagulation and metoprolol for rate control  6)Social----  D/w POA and caregiver....  [name] ----  [phone], plan of care reviewed  Code Status :  DNR  Disposition Plan  : Home with [place] on 10/28/17   DVT Prophylaxis  :  Eliquis   Discharge Condition: stable  Follow UP- Dr Donzetta Matters   Consults obtained - ID Dr Linus Salmons --- phone consult  Diet and Activity recommendation:  As advised  Discharge Instructions    Discharge Instructions    Call MD for:  difficulty breathing, headache or visual disturbances   Complete by:  As directed    Call MD for:  persistant dizziness or light-headedness   Complete by:  As directed    Call MD for:  persistant nausea and vomiting   Complete by:  As directed    Call MD for:  redness, tenderness, or signs of infection (pain, swelling, redness, odor or green/yellow discharge around incision site)   Complete by:  As directed    Call MD for:  severe uncontrolled pain   Complete by:  As directed    Call MD for:  temperature >100.4   Complete by:  As directed    Diet - low sodium heart healthy   Complete by:  As directed    Discharge instructions   Complete by:  As directed    1) take Bactrim/Septra antibiotic as prescribed for  leg and foot  infections 2) take probiotic and yogurt to reduce risk of antibiotic related diarrhea 3) nurse to come to the house for wound changes and dressing 4) follow-up with vascular surgeon Dr. Donzetta Matters next week as previously advised 5)Avoid ibuprofen/Advil/Aleve/Motrin/Goody Powders/Naproxen/BC powders/Meloxicam/Diclofenac/Indomethacin and other Nonsteroidal anti-inflammatory medications as these will make you more likely to bleed and can cause stomach ulcers, can also cause Kidney problems.  6)Do NOT Take Lasix/Furosemide-while you are taking Bactrim antibiotic 7)You have Infected postop wound with cellulitis with MRSA bacteria 8)Repeat BMP/kidney blood test in 1 week 9)Do NOT Take Lisinopril-while you are taking Bactrim antibiotic, May Restart Lisinopril for Blood Pressure after 11/08/17 to reduce risk of kidney complications   Increase activity slowly   Complete by:  As directed         Discharge Medications     Allergies as of 10/28/2017      Reactions   Morphine And Related Nausea And Vomiting   Oxycodone Nausea And Vomiting   Plavix [clopidogrel]    Rash on upper body      Medication List    STOP taking these medications   cephALEXin 500 MG capsule Commonly known as:  KEFLEX   clopidogrel 75 MG tablet Commonly known as:  PLAVIX   lisinopril 10 MG tablet Commonly known as:  PRINIVIL,ZESTRIL   PROBIOTIC PO     TAKE these medications   acetaminophen 500 MG tablet Commonly known as:  TYLENOL Take 500 mg by mouth every 6 (six) hours as needed for mild pain, moderate pain or headache. What changed:  Another medication with the same name was added. Make sure you understand how and when to take each.   acetaminophen 325 MG tablet Commonly known as:  TYLENOL Take 2 tablets (650 mg total) by mouth every 6 (six) hours as needed for mild pain (or Fever >/= 101). What changed:  You were already taking a medication with the same name, and this prescription was added. Make sure you  understand how and when to take each.   aspirin EC 81 MG tablet Take 162 mg by mouth 2 (two) times daily.   BENADRYL ALLERGY 25 MG tablet Generic drug:  diphenhydrAMINE Take 25 mg by mouth at bedtime as needed (rash  due to Plavix).   Calcium Carbonate-Vitamin D 600-200 MG-UNIT Tabs Take 1 tablet by mouth 2 (two) times daily. Take 1 tablet daily for vitamin supplement.   collagenase ointment Commonly known as:  SANTYL Apply topically daily.   ELIQUIS 2.5 MG Tabs tablet Generic drug:  apixaban TAKE 1 TABLET BY MOUTH TWICE DAILY.   ferrous sulfate 325 (65 FE) MG tablet Take 325 mg by mouth 2 (two) times a week. Takes one tablet by mouth every three days   fexofenadine 180 MG tablet Commonly known as:  ALLEGRA ALLERGY Take 1 tablet (180 mg total) by mouth daily.   Fish Oil 1000 MG Caps Take 1,000 mg by mouth daily.   furosemide 20 MG tablet Commonly known as:  LASIX take 10-20 mg by mouth daily as needed for fluid--do not take while you are taking Bactrim antibiotic What changed:  See the new instructions.   GLUCOSAMINE 1500 COMPLEX Caps Take one tablet once a day   glucose blood test strip Commonly known as:  TRUETEST TEST Use to check blood sugar once daily Dx:E11.22   lactobacillus Pack Take 1 packet (1 g total) by mouth 3 (three) times daily with meals.   metFORMIN 500 MG tablet Commonly known as:  GLUCOPHAGE TAKE 1 TABLET BY MOUTH TWICE DAILY WITH A MEAL.   metoprolol tartrate 25 MG tablet Commonly known as:  LOPRESSOR TAKE 1 TABLET BY MOUTH TWICE DAILY.   multivitamin with minerals Tabs tablet Take 1 tablet by mouth daily.   ondansetron 4 MG tablet Commonly known as:  ZOFRAN Take 1 tablet (4 mg total) by mouth every 6 (six) hours as needed for nausea.   simvastatin 10 MG tablet Commonly known as:  ZOCOR TAKE ONE TABLET AT BEDTIME. What changed:    how much to take  how to take this  when to take this   sulfamethoxazole-trimethoprim 800-160 MG  tablet Commonly known as:  BACTRIM DS,SEPTRA DS Take 1 tablet by mouth 2 (two) times daily for 6 days.   TRUEPLUS LANCETS 30G Misc Use to check blood sugar once daily. Dx E11.9       Major procedures and Radiology Reports - PLEASE review detailed and final reports for all details, in brief -   Dg Foot Complete Left  Result Date: 10/25/2017 CLINICAL DATA:  Cellulitis wound above the medial malleolus EXAM: LEFT FOOT - COMPLETE 3+ VIEW COMPARISON:  07/30/2011 FINDINGS: Diffuse osteopenia. Mild sclerosis and irregularity at the head of the fifth metatarsal. No definite periostitis or bony destructive changes. Suggested osseous bridging between the navicular and cuboid with sclerosis and probable partial fusion at the cuneiform cuboidal articulation. These findings do not appear significantly changed. No soft tissue gas. IMPRESSION: 1. Diffuse osteopenia. Possible subacute fracture at the head of the fifth metatarsal. Electronically Signed   By: Donavan Foil M.D.   On: 10/25/2017 23:49    Micro Results    Recent Results (from the past 240 hour(s))  Blood culture (routine x 2)     Status: None (Preliminary result)   Collection Time: 10/26/17  4:10 AM  Result Value Ref Range Status   Specimen Description   Final    BLOOD RIGHT FOREARM Performed at [facility] [address] [place], [place] [address]    Special Requests   Final    BOTTLES DRAWN AEROBIC ONLY Blood Culture adequate volume Performed at [facility] [address], [place], [place] [address]    Culture   Final  NO GROWTH 2 DAYS Performed at [facility], [place] [address], [place], [place] [address]    Report Status PENDING  Incomplete  Blood culture (routine x 2)     Status: None (Preliminary result)   Collection Time: 10/26/17  4:10 AM  Result Value Ref Range Status   Specimen Description   Final    BLOOD RIGHT ANTECUBITAL Performed at [facility] [address], [place], [place] [address]    Special Requests   Final    BOTTLES DRAWN AEROBIC ONLY Blood Culture adequate volume Performed at [facility] [address], [place], [place] [address]    Culture   Final    NO GROWTH 2 DAYS Performed at [facility] [address] [place], [place] [address]    Report Status PENDING  Incomplete  Aerobic Culture (superficial specimen)     Status: None   Collection Time: 10/26/17  9:40 AM  Result Value Ref Range Status   Specimen Description   Final    WOUND LEFT LEG Performed at [facility] [address], [place], [place] [address]    Special Requests   Final    Normal Performed at [facility], [place] [address] [place], [place] [address]    Gram Stain   Final    NO WBC SEEN NO ORGANISMS SEEN Performed at [facility], [place] [address] [place], [place] [address]    Culture   Final    MODERATE METHICILLIN RESISTANT STAPHYLOCOCCUS AUREUS   Report Status 10/28/2017 FINAL  Final   Organism ID, Bacteria METHICILLIN RESISTANT STAPHYLOCOCCUS AUREUS  Final      Susceptibility   Methicillin resistant staphylococcus aureus - MIC*    CIPROFLOXACIN >=8 RESISTANT Resistant     ERYTHROMYCIN >=8 RESISTANT Resistant     GENTAMICIN <=0.5 SENSITIVE Sensitive     OXACILLIN >=4 RESISTANT Resistant     TETRACYCLINE >=16 RESISTANT Resistant     VANCOMYCIN 1 SENSITIVE Sensitive     TRIMETH/SULFA <=10 SENSITIVE Sensitive     CLINDAMYCIN <=0.25 SENSITIVE Sensitive     RIFAMPIN <=0.5 SENSITIVE Sensitive     Inducible Clindamycin NEGATIVE Sensitive     * MODERATE METHICILLIN RESISTANT STAPHYLOCOCCUS AUREUS       Today   Subjective    Logan Bean today has no new complaints, eager to go home, No fever  Or chills           Patient has been seen and examined prior to discharge   Objective   Blood pressure (!) 132/92, pulse 90, temperature 97.7 F (36.5 C),  temperature source Oral, resp. rate 18, height 5\' 3"  (1.6 m), weight 56.7 kg (125 lb), SpO2 98 %.   Intake/Output Summary (Last 24 hours) at 10/28/2017 1614 Last data filed at 10/28/2017 1445 Gross per 24 hour  Intake 880 ml  Output 700 ml  Net 180 ml    Exam Gen:- Awake Alert,  In no apparent distress 3 HEENT:- [place].AT, No sclera icterus Neck-Supple Neck,No JVD,.  Lungs-  CTAB , good air movement CV- S1, S2 normal Abd-  +ve B.Sounds, Abd Soft, No tenderness,    Extremity/Skin:-Left lower extremity open wounds with scant drainage , improving erythema, improving redness, improving wound, improving swelling and improving tenderness overall  psych-affect is appropriate, oriented x3 Neuro-no new focal deficits, no tremors   Data Review   CBC w Diff:  Lab Results  Component Value Date  WBC 9.8 10/28/2017   HGB 8.6 (L) 10/28/2017   HGB 12.4 (L) 07/27/2015   HCT 28.1 (L) 10/28/2017   HCT 39.2 07/27/2015   PLT 213 10/28/2017   PLT 235 07/27/2015   LYMPHOPCT 9 10/25/2017   MONOPCT 9 10/25/2017   EOSPCT 3 10/25/2017   BASOPCT 0 10/25/2017    CMP:  Lab Results  Component Value Date   NA 137 10/28/2017   NA 143 07/27/2015   K 4.4 10/28/2017   CL 108 10/28/2017   CO2 20 (L) 10/28/2017   BUN 29 (H) 10/28/2017   BUN 29 07/27/2015   CREATININE 1.03 10/28/2017   CREATININE 0.85 08/05/2017   PROT 6.5 10/01/2017   PROT 5.8 (L) 07/27/2015   ALBUMIN 3.4 (L) 10/01/2017   ALBUMIN 3.9 07/27/2015   BILITOT 0.8 10/01/2017   BILITOT 0.7 07/27/2015   ALKPHOS 82 10/01/2017   AST 16 10/01/2017   ALT 17 10/01/2017  .  Total Discharge time is about 33 minutes  Roxan Hockey M.D on 10/28/2017 at [place] PM  [facility]   Office  [phone]  Voice Recognition Vivian[employer] dictation system was used to create this note, attempts have been made to correct errors. Please contact the author with questions and/or clarifications.

## 2017-10-28 NOTE — Progress Notes (Addendum)
Discharge instructions reviewed with patient and caregiver. Both verbalized understanding.Patient to be discharged via private vehicle.

## 2017-10-28 NOTE — Discharge Instructions (Signed)
1) take Bactrim/Septra antibiotic as prescribed for  leg and foot infections 2) take probiotic and yogurt to reduce risk of antibiotic related diarrhea 3) nurse to come to the house for wound changes and dressing 4) follow-up with vascular surgeon Dr. Donzetta Matters next week as previously advised 5)Avoid ibuprofen/Advil/Aleve/Motrin/Goody Powders/Naproxen/BC powders/Meloxicam/Diclofenac/Indomethacin and other Nonsteroidal anti-inflammatory medications as these will make you more likely to bleed and can cause stomach ulcers, can also cause Kidney problems.  6)Do NOT Take Lasix/Furosemide-while you are taking Bactrim antibiotic 7)You have Infected postop wound with cellulitis with MRSA bacteria 8)Repeat BMP/kidney blood test in 1 week 9)Do NOT Take Lisinopril-while you are taking Bactrim antibiotic, May Restart Lisinopril for Blood Pressure after 11/08/17 to reduce risk of kidney complications

## 2017-10-29 ENCOUNTER — Telehealth: Payer: Self-pay

## 2017-10-29 DIAGNOSIS — E440 Moderate protein-calorie malnutrition: Secondary | ICD-10-CM | POA: Diagnosis not present

## 2017-10-29 DIAGNOSIS — M6281 Muscle weakness (generalized): Secondary | ICD-10-CM | POA: Diagnosis not present

## 2017-10-29 DIAGNOSIS — E1142 Type 2 diabetes mellitus with diabetic polyneuropathy: Secondary | ICD-10-CM | POA: Diagnosis not present

## 2017-10-29 DIAGNOSIS — L89899 Pressure ulcer of other site, unspecified stage: Secondary | ICD-10-CM | POA: Diagnosis not present

## 2017-10-29 DIAGNOSIS — I482 Chronic atrial fibrillation: Secondary | ICD-10-CM | POA: Diagnosis not present

## 2017-10-29 DIAGNOSIS — I83892 Varicose veins of left lower extremities with other complications: Secondary | ICD-10-CM | POA: Diagnosis not present

## 2017-10-29 NOTE — Telephone Encounter (Signed)
Transition Care Management Follow-Up Telephone Call   Date discharged and where: [facility] on 10/28/2017  How have you been since you were released from the hospital? Home health care nurse visiting. Caregiver stated there has been no problems since he has been home  Any patient concerns? None  Items Reviewed:   Meds: Y, been taking antibiotics and probiotics and d/c medications stated on discharge papers  Allergies: Y  Dietary Changes Reviewed: Y  Functional Questionnaire:  Independent-I Dependent-D  ADLs:   Dressing- I    Eating-I   Maintaining continence- I   Transferring- I w/ walker   Transportation- D   Meal Prep- D   Managing Meds- I w/ assist  Confirmed importance and Date/Time of follow-up visits scheduled: Yes, Sherrie Mustache, NP 10/31/2017 @ 2:15pm   Confirmed with patient if condition worsens to call PCP or go to the Emergency Dept. Patient was given office number and encouraged to call back with questions or concerns: Yes

## 2017-10-31 ENCOUNTER — Inpatient Hospital Stay (HOSPITAL_COMMUNITY): Payer: Medicare Other

## 2017-10-31 ENCOUNTER — Other Ambulatory Visit: Payer: Self-pay

## 2017-10-31 ENCOUNTER — Inpatient Hospital Stay (HOSPITAL_COMMUNITY)
Admission: EM | Admit: 2017-10-31 | Discharge: 2017-11-08 | DRG: 470 | Disposition: A | Discharge status: Hospice | Payer: Medicare Other | Attending: Internal Medicine | Admitting: Internal Medicine

## 2017-10-31 ENCOUNTER — Encounter (HOSPITAL_COMMUNITY): Payer: Self-pay

## 2017-10-31 ENCOUNTER — Emergency Department (HOSPITAL_COMMUNITY): Payer: Medicare Other

## 2017-10-31 DIAGNOSIS — E1122 Type 2 diabetes mellitus with diabetic chronic kidney disease: Secondary | ICD-10-CM | POA: Diagnosis not present

## 2017-10-31 DIAGNOSIS — F649 Gender identity disorder, unspecified: Secondary | ICD-10-CM | POA: Diagnosis present

## 2017-10-31 DIAGNOSIS — Z7901 Long term (current) use of anticoagulants: Secondary | ICD-10-CM | POA: Diagnosis not present

## 2017-10-31 DIAGNOSIS — Z96641 Presence of right artificial hip joint: Secondary | ICD-10-CM | POA: Diagnosis not present

## 2017-10-31 DIAGNOSIS — W1830XA Fall on same level, unspecified, initial encounter: Secondary | ICD-10-CM | POA: Diagnosis present

## 2017-10-31 DIAGNOSIS — S72011A Unspecified intracapsular fracture of right femur, initial encounter for closed fracture: Secondary | ICD-10-CM | POA: Diagnosis present

## 2017-10-31 DIAGNOSIS — E1151 Type 2 diabetes mellitus with diabetic peripheral angiopathy without gangrene: Secondary | ICD-10-CM | POA: Diagnosis present

## 2017-10-31 DIAGNOSIS — Y92009 Unspecified place in unspecified non-institutional (private) residence as the place of occurrence of the external cause: Secondary | ICD-10-CM | POA: Diagnosis not present

## 2017-10-31 DIAGNOSIS — I4891 Unspecified atrial fibrillation: Secondary | ICD-10-CM

## 2017-10-31 DIAGNOSIS — I130 Hypertensive heart and chronic kidney disease with heart failure and stage 1 through stage 4 chronic kidney disease, or unspecified chronic kidney disease: Secondary | ICD-10-CM | POA: Diagnosis present

## 2017-10-31 DIAGNOSIS — Z7189 Other specified counseling: Secondary | ICD-10-CM | POA: Diagnosis not present

## 2017-10-31 DIAGNOSIS — E785 Hyperlipidemia, unspecified: Secondary | ICD-10-CM | POA: Diagnosis not present

## 2017-10-31 DIAGNOSIS — I509 Heart failure, unspecified: Secondary | ICD-10-CM | POA: Diagnosis not present

## 2017-10-31 DIAGNOSIS — Z96611 Presence of right artificial shoulder joint: Secondary | ICD-10-CM | POA: Diagnosis present

## 2017-10-31 DIAGNOSIS — M1712 Unilateral primary osteoarthritis, left knee: Secondary | ICD-10-CM | POA: Diagnosis present

## 2017-10-31 DIAGNOSIS — R17 Unspecified jaundice: Secondary | ICD-10-CM | POA: Diagnosis not present

## 2017-10-31 DIAGNOSIS — Y9301 Activity, walking, marching and hiking: Secondary | ICD-10-CM

## 2017-10-31 DIAGNOSIS — B9562 Methicillin resistant Staphylococcus aureus infection as the cause of diseases classified elsewhere: Secondary | ICD-10-CM | POA: Diagnosis present

## 2017-10-31 DIAGNOSIS — N183 Chronic kidney disease, stage 3 unspecified: Secondary | ICD-10-CM | POA: Diagnosis present

## 2017-10-31 DIAGNOSIS — K219 Gastro-esophageal reflux disease without esophagitis: Secondary | ICD-10-CM | POA: Diagnosis not present

## 2017-10-31 DIAGNOSIS — Z961 Presence of intraocular lens: Secondary | ICD-10-CM | POA: Diagnosis present

## 2017-10-31 DIAGNOSIS — L0390 Cellulitis, unspecified: Secondary | ICD-10-CM | POA: Diagnosis present

## 2017-10-31 DIAGNOSIS — R1310 Dysphagia, unspecified: Secondary | ICD-10-CM | POA: Diagnosis not present

## 2017-10-31 DIAGNOSIS — Z9841 Cataract extraction status, right eye: Secondary | ICD-10-CM

## 2017-10-31 DIAGNOSIS — I83009 Varicose veins of unspecified lower extremity with ulcer of unspecified site: Secondary | ICD-10-CM | POA: Diagnosis present

## 2017-10-31 DIAGNOSIS — S299XXA Unspecified injury of thorax, initial encounter: Secondary | ICD-10-CM | POA: Diagnosis not present

## 2017-10-31 DIAGNOSIS — R9389 Abnormal findings on diagnostic imaging of other specified body structures: Secondary | ICD-10-CM | POA: Diagnosis not present

## 2017-10-31 DIAGNOSIS — L89622 Pressure ulcer of left heel, stage 2: Secondary | ICD-10-CM | POA: Diagnosis present

## 2017-10-31 DIAGNOSIS — G8918 Other acute postprocedural pain: Secondary | ICD-10-CM | POA: Diagnosis not present

## 2017-10-31 DIAGNOSIS — N179 Acute kidney failure, unspecified: Secondary | ICD-10-CM | POA: Diagnosis present

## 2017-10-31 DIAGNOSIS — R4182 Altered mental status, unspecified: Secondary | ICD-10-CM | POA: Diagnosis not present

## 2017-10-31 DIAGNOSIS — D72829 Elevated white blood cell count, unspecified: Secondary | ICD-10-CM | POA: Diagnosis not present

## 2017-10-31 DIAGNOSIS — D508 Other iron deficiency anemias: Secondary | ICD-10-CM | POA: Diagnosis not present

## 2017-10-31 DIAGNOSIS — L97909 Non-pressure chronic ulcer of unspecified part of unspecified lower leg with unspecified severity: Secondary | ICD-10-CM | POA: Diagnosis present

## 2017-10-31 DIAGNOSIS — Z471 Aftercare following joint replacement surgery: Secondary | ICD-10-CM | POA: Diagnosis not present

## 2017-10-31 DIAGNOSIS — Z66 Do not resuscitate: Secondary | ICD-10-CM | POA: Diagnosis present

## 2017-10-31 DIAGNOSIS — Z9842 Cataract extraction status, left eye: Secondary | ICD-10-CM

## 2017-10-31 DIAGNOSIS — E872 Acidosis: Secondary | ICD-10-CM | POA: Diagnosis not present

## 2017-10-31 DIAGNOSIS — I482 Chronic atrial fibrillation, unspecified: Secondary | ICD-10-CM | POA: Diagnosis present

## 2017-10-31 DIAGNOSIS — J9600 Acute respiratory failure, unspecified whether with hypoxia or hypercapnia: Secondary | ICD-10-CM | POA: Diagnosis not present

## 2017-10-31 DIAGNOSIS — S81811A Laceration without foreign body, right lower leg, initial encounter: Secondary | ICD-10-CM | POA: Diagnosis present

## 2017-10-31 DIAGNOSIS — Z515 Encounter for palliative care: Secondary | ICD-10-CM | POA: Diagnosis not present

## 2017-10-31 DIAGNOSIS — Z8249 Family history of ischemic heart disease and other diseases of the circulatory system: Secondary | ICD-10-CM

## 2017-10-31 DIAGNOSIS — W19XXXA Unspecified fall, initial encounter: Secondary | ICD-10-CM

## 2017-10-31 DIAGNOSIS — W19XXXD Unspecified fall, subsequent encounter: Secondary | ICD-10-CM | POA: Diagnosis not present

## 2017-10-31 DIAGNOSIS — L03116 Cellulitis of left lower limb: Secondary | ICD-10-CM | POA: Diagnosis present

## 2017-10-31 DIAGNOSIS — M25559 Pain in unspecified hip: Secondary | ICD-10-CM | POA: Diagnosis not present

## 2017-10-31 DIAGNOSIS — S72009A Fracture of unspecified part of neck of unspecified femur, initial encounter for closed fracture: Secondary | ICD-10-CM | POA: Diagnosis present

## 2017-10-31 DIAGNOSIS — Z7902 Long term (current) use of antithrombotics/antiplatelets: Secondary | ICD-10-CM

## 2017-10-31 DIAGNOSIS — S79911A Unspecified injury of right hip, initial encounter: Secondary | ICD-10-CM | POA: Diagnosis not present

## 2017-10-31 DIAGNOSIS — S72001A Fracture of unspecified part of neck of right femur, initial encounter for closed fracture: Secondary | ICD-10-CM

## 2017-10-31 DIAGNOSIS — J690 Pneumonitis due to inhalation of food and vomit: Secondary | ICD-10-CM | POA: Diagnosis not present

## 2017-10-31 DIAGNOSIS — S72091A Other fracture of head and neck of right femur, initial encounter for closed fracture: Secondary | ICD-10-CM | POA: Diagnosis not present

## 2017-10-31 DIAGNOSIS — T148XXA Other injury of unspecified body region, initial encounter: Secondary | ICD-10-CM | POA: Diagnosis not present

## 2017-10-31 DIAGNOSIS — S0990XA Unspecified injury of head, initial encounter: Secondary | ICD-10-CM | POA: Diagnosis not present

## 2017-10-31 DIAGNOSIS — M79604 Pain in right leg: Secondary | ICD-10-CM | POA: Diagnosis not present

## 2017-10-31 DIAGNOSIS — Z96649 Presence of unspecified artificial hip joint: Secondary | ICD-10-CM

## 2017-10-31 LAB — CBC WITH DIFFERENTIAL/PLATELET
BASOS ABS: 0.1 10*3/uL (ref 0.0–0.1)
Basophils Relative: 0 %
Eosinophils Absolute: 0.1 10*3/uL (ref 0.0–0.7)
Eosinophils Relative: 0 %
HEMATOCRIT: 30.7 % — AB (ref 39.0–52.0)
Hemoglobin: 9.2 g/dL — ABNORMAL LOW (ref 13.0–17.0)
LYMPHS PCT: 5 %
Lymphs Abs: 0.8 10*3/uL (ref 0.7–4.0)
MCH: 23 pg — ABNORMAL LOW (ref 26.0–34.0)
MCHC: 30 g/dL (ref 30.0–36.0)
MCV: 76.8 fL — AB (ref 78.0–100.0)
MONO ABS: 0.8 10*3/uL (ref 0.1–1.0)
MONOS PCT: 5 %
NEUTROS ABS: 13.7 10*3/uL — AB (ref 1.7–7.7)
Neutrophils Relative %: 90 %
Platelets: 305 10*3/uL (ref 150–400)
RBC: 4 MIL/uL — ABNORMAL LOW (ref 4.22–5.81)
RDW: 17.9 % — AB (ref 11.5–15.5)
WBC: 15.3 10*3/uL — ABNORMAL HIGH (ref 4.0–10.5)

## 2017-10-31 LAB — COMPREHENSIVE METABOLIC PANEL
ALT: 27 U/L (ref 17–63)
AST: 25 U/L (ref 15–41)
Albumin: 3 g/dL — ABNORMAL LOW (ref 3.5–5.0)
Alkaline Phosphatase: 88 U/L (ref 38–126)
Anion gap: 11 (ref 5–15)
BILIRUBIN TOTAL: 0.9 mg/dL (ref 0.3–1.2)
BUN: 41 mg/dL — AB (ref 6–20)
CALCIUM: 9.5 mg/dL (ref 8.9–10.3)
CO2: 16 mmol/L — ABNORMAL LOW (ref 22–32)
Chloride: 106 mmol/L (ref 101–111)
Creatinine, Ser: 1.28 mg/dL — ABNORMAL HIGH (ref 0.61–1.24)
GFR calc Af Amer: 53 mL/min — ABNORMAL LOW (ref >60)
GFR, EST NON AFRICAN AMERICAN: 46 mL/min — AB (ref >60)
Glucose, Bld: 227 mg/dL — ABNORMAL HIGH (ref 65–99)
POTASSIUM: 5 mmol/L (ref 3.5–5.1)
Sodium: 133 mmol/L — ABNORMAL LOW (ref 135–145)
TOTAL PROTEIN: 6.1 g/dL — AB (ref 6.5–8.1)

## 2017-10-31 LAB — GLUCOSE, CAPILLARY: Glucose-Capillary: 169 mg/dL — ABNORMAL HIGH (ref 65–99)

## 2017-10-31 LAB — CULTURE, BLOOD (ROUTINE X 2)
CULTURE: NO GROWTH
Culture: NO GROWTH
Special Requests: ADEQUATE
Special Requests: ADEQUATE

## 2017-10-31 LAB — PROTIME-INR
INR: 1.33
PROTHROMBIN TIME: 16.4 s — AB (ref 11.4–15.2)

## 2017-10-31 LAB — CBG MONITORING, ED: GLUCOSE-CAPILLARY: 172 mg/dL — AB (ref 65–99)

## 2017-10-31 MED ORDER — MORPHINE SULFATE (PF) 4 MG/ML IV SOLN: 0.5000 mg | INTRAVENOUS | Status: DC | PRN

## 2017-10-31 MED ORDER — HYDROCODONE-ACETAMINOPHEN 5-325 MG PO TABS: 1.0000 | ORAL_TABLET | Freq: Four times a day (QID) | ORAL | Status: DC | PRN

## 2017-10-31 MED ORDER — SULFAMETHOXAZOLE-TRIMETHOPRIM 800-160 MG PO TABS
1.0000 | ORAL_TABLET | Freq: Two times a day (BID) | ORAL | Status: DC
  Administered 2017-10-31 – 2017-11-02 (×4): 1 via ORAL
  Filled 2017-10-31 (×4): qty 1

## 2017-10-31 MED ORDER — FLORANEX PO PACK
1.0000 g | PACK | Freq: Three times a day (TID) | ORAL | Status: DC
  Administered 2017-11-01: 1 g via ORAL
  Filled 2017-10-31 (×15): qty 1

## 2017-10-31 MED ORDER — SODIUM CHLORIDE 0.9 % IV SOLN
INTRAVENOUS | Status: AC
  Administered 2017-10-31: 14:00:00 via INTRAVENOUS
  Administered 2017-11-01: 50 mL/h via INTRAVENOUS

## 2017-10-31 MED ORDER — CALCIUM CARBONATE-VITAMIN D 500-200 MG-UNIT PO TABS
1.0000 | ORAL_TABLET | Freq: Two times a day (BID) | ORAL | Status: DC
  Administered 2017-11-01 (×2): 1 via ORAL
  Filled 2017-10-31 (×4): qty 1

## 2017-10-31 MED ORDER — FENTANYL CITRATE (PF) 100 MCG/2ML IJ SOLN
50.0000 ug | Freq: Once | INTRAMUSCULAR | Status: AC
  Administered 2017-10-31: 50 ug via INTRAVENOUS

## 2017-10-31 MED ORDER — FENTANYL CITRATE (PF) 100 MCG/2ML IJ SOLN
50.0000 ug | Freq: Once | INTRAMUSCULAR | Status: AC
  Administered 2017-10-31: 50 ug via INTRAVENOUS
  Filled 2017-10-31: qty 2

## 2017-10-31 MED ORDER — INSULIN ASPART 100 UNIT/ML ~~LOC~~ SOLN
0.0000 [IU] | Freq: Three times a day (TID) | SUBCUTANEOUS | Status: DC
  Administered 2017-11-01 – 2017-11-03 (×3): 3 [IU] via SUBCUTANEOUS
  Administered 2017-11-03: 2 [IU] via SUBCUTANEOUS
  Administered 2017-11-03: 5 [IU] via SUBCUTANEOUS
  Administered 2017-11-04 – 2017-11-06 (×6): 2 [IU] via SUBCUTANEOUS
  Administered 2017-11-08 (×2): 3 [IU] via SUBCUTANEOUS

## 2017-10-31 MED ORDER — INSULIN ASPART 100 UNIT/ML ~~LOC~~ SOLN: 0.0000 [IU] | Freq: Every day | SUBCUTANEOUS | Status: DC

## 2017-10-31 MED ORDER — DOCUSATE SODIUM 100 MG PO CAPS
100.0000 mg | ORAL_CAPSULE | Freq: Two times a day (BID) | ORAL | Status: DC
  Administered 2017-10-31 – 2017-11-01 (×4): 100 mg via ORAL
  Filled 2017-10-31 (×4): qty 1

## 2017-10-31 MED ORDER — LORATADINE 10 MG PO TABS
10.0000 mg | ORAL_TABLET | Freq: Every day | ORAL | Status: DC
  Administered 2017-11-01: 10 mg via ORAL
  Filled 2017-10-31 (×2): qty 1

## 2017-10-31 MED ORDER — METHOCARBAMOL 1000 MG/10ML IJ SOLN
500.0000 mg | Freq: Four times a day (QID) | INTRAVENOUS | Status: DC | PRN
  Administered 2017-11-02 – 2017-11-08 (×7): 500 mg via INTRAVENOUS
  Filled 2017-10-31 (×2): qty 550
  Filled 2017-10-31: qty 5
  Filled 2017-10-31: qty 550
  Filled 2017-10-31: qty 5
  Filled 2017-10-31 (×4): qty 550

## 2017-10-31 MED ORDER — METHOCARBAMOL 500 MG PO TABS
500.0000 mg | ORAL_TABLET | Freq: Four times a day (QID) | ORAL | Status: DC | PRN
  Administered 2017-10-31 – 2017-11-02 (×4): 500 mg via ORAL
  Filled 2017-10-31 (×5): qty 1

## 2017-10-31 MED ORDER — HEPARIN SODIUM (PORCINE) 5000 UNIT/ML IJ SOLN
5000.0000 [IU] | Freq: Three times a day (TID) | INTRAMUSCULAR | Status: AC
  Administered 2017-10-31 – 2017-11-02 (×6): 5000 [IU] via SUBCUTANEOUS
  Filled 2017-10-31 (×6): qty 1

## 2017-10-31 MED ORDER — METOPROLOL TARTRATE 25 MG PO TABS
25.0000 mg | ORAL_TABLET | Freq: Two times a day (BID) | ORAL | Status: DC
  Administered 2017-10-31 – 2017-11-02 (×4): 25 mg via ORAL
  Filled 2017-10-31 (×4): qty 1

## 2017-10-31 MED ORDER — FENTANYL CITRATE (PF) 100 MCG/2ML IJ SOLN
25.0000 ug | INTRAMUSCULAR | Status: DC | PRN
  Administered 2017-10-31 – 2017-11-04 (×25): 25 ug via INTRAVENOUS
  Filled 2017-10-31 (×26): qty 2

## 2017-10-31 MED ORDER — ACETAMINOPHEN 325 MG PO TABS
650.0000 mg | ORAL_TABLET | Freq: Four times a day (QID) | ORAL | Status: DC | PRN
  Administered 2017-11-01: 650 mg via ORAL
  Filled 2017-10-31: qty 2

## 2017-10-31 MED ORDER — DIPHENHYDRAMINE HCL 25 MG PO TABS
25.0000 mg | ORAL_TABLET | Freq: Every evening | ORAL | Status: DC | PRN
  Filled 2017-10-31: qty 1

## 2017-10-31 MED ORDER — FERROUS SULFATE 325 (65 FE) MG PO TABS
325.0000 mg | ORAL_TABLET | ORAL | Status: DC
Start: 2017-11-02 — End: 2017-11-02

## 2017-10-31 MED ORDER — SIMVASTATIN 20 MG PO TABS
10.0000 mg | ORAL_TABLET | Freq: Every day | ORAL | Status: DC
  Administered 2017-10-31 – 2017-11-01 (×2): 10 mg via ORAL
  Filled 2017-10-31 (×3): qty 1

## 2017-10-31 NOTE — ED Provider Notes (Signed)
[facility] EMERGENCY DEPARTMENT Provider Note   CSN: [id number] Arrival date & time: 10/31/17  6295     History   Chief Complaint Chief Complaint  Patient presents with  . Fall    HPI Logan Bean is a 84 y.o. male.  HPI    84 year old male with a history of hypertension, DM, peripheral vascular disease, arthritis, venous stasis ulcers, recent admission with a left lower extremity cellulitis who presents with concern for fall.  Patient has been discharged 2 days ago, and since then has been weaker than usual.  Patient reports he was walking with his walker last night, and felt "like the [place] ran out of me", and fell to the ground.  Reports he thinks he fell on his hip.  He denies head trauma, headache or loss of consciousness.  Initially EMS came out to the house, patient was able to stand with his walker, had pain, they had discussed whether or not to transport patient, given his recent hospitalization, and patient's feeling of the time, they chose to stay home.  Reports that overnight he had severe worsening right hip pain, and patient has been calling out in pain.  He denies any other areas of pain or injury.  Denies chest pain, shortness of breath, abdominal pain.  Denies fevers.  They have been treating his cellulitis with antibiotics and wound care, and reports that it does not look worse, but it "does not look great."  Family notes that patient is transgender, male to male, however they were not aware of this for most of her life and report that given secrecy of this to other family and friends, want to ensure that he is addressed in the masculine in the hospital.   Past Medical History:  Diagnosis Date  . Anxiety   . Arthritis    "probably all over" (10/21/2017)  . Depression   . Diaphragmatic hernia without mention of obstruction or gangrene   . Dizziness and giddiness   . Edema   . GERD (gastroesophageal reflux disease)   . H/O hiatal hernia   .  History of gout   . History of kidney stones   . History of stomach ulcers   . Hypercholesteremia   . Hypertension   . Left rotator cuff tear Jul 12, 2012  . PVD (peripheral vascular disease) ([place])   . Stomach ulcer    years  . Type II diabetes mellitus ([place])   . Unspecified hereditary and idiopathic peripheral neuropathy   . Unspecified vitamin D deficiency   . Varicose veins    both legs    Patient Active Problem List   Diagnosis Date Noted  . Hip fracture ([place]) 10/31/2017  . Abnormal chest x-ray 10/31/2017  . Cellulitis 10/26/2017  . PAD (peripheral artery disease) ([place]) 10/21/2017  . Iron deficiency anemia secondary to inadequate dietary iron intake 08/19/2016  . Other constipation 08/19/2016  . Capillary vascular disease 11/21/2015  . Venous ulcer of ankle ([place]) 11/02/2015  . Near syncope 04/27/2015  . Type 2 diabetes mellitus with hyperglycemia ([place]) 04/27/2015  . Contusion of multiple sites 04/26/2015  . Type 2 diabetes mellitus with diabetic chronic kidney disease ([place]) 07/11/2014  . Colon cancer screening 07/11/2014  . Hygroma 05/19/2014  . Atrial fibrillation with RVR ([place]) 05/18/2014  . Laceration of skin of face 05/18/2014  . Fall   . Venous stasis dermatitis 05/16/2014  . Carpal tunnel syndrome 05/10/2014  . CKD (chronic kidney disease) stage 3, GFR 30-59 ml/min (  [place]) 10/11/2013  . Restless leg syndrome 05/25/2013  . Hyperthyroidism, subclinical 03/02/2013  . Chronic anticoagulation-Eliquis 01/31/2013  . Chronic atrial fibrillation ([place]) 01/21/2013  . Sweating increase 01/19/2013  . CHF (congestive heart failure) ([place]) 01/19/2013  . Pleural effusion 01/19/2013  . Multinodular goiter 01/19/2013  . Dyspnea 01/15/2013  . Edema 01/15/2013  . Hypokalemia 01/15/2013  . Rash and nonspecific skin eruption 01/15/2013  . Dyslipidemia 12/29/2012  . GERD (gastroesophageal reflux disease) 12/29/2012  . Loss of weight 12/29/2012  . Closed bimalleolar fracture of  right ankle 09/28/2012  . Diarrhea 09/22/2012  . Essential hypertension 09/21/2012  . Complete tear of left rotator cuff 11/05/2011    Past Surgical History:  Procedure Laterality Date  . ABDOMINAL AORTOGRAM W/LOWER EXTREMITY N/A 10/21/2017   Procedure: ABDOMINAL AORTOGRAM W/LOWER EXTREMITY - Left;  Surgeon: Waynetta Sandy, MD;  Location: [facility];  Service: Cardiovascular;  Laterality: N/A;  . CARDIOVERSION N/A 02/22/2013   Procedure: CARDIOVERSION;  Surgeon: Darlin Coco, MD;  Location: [facility];  Service: Cardiovascular;  Laterality: N/A;  . CATARACT EXTRACTION W/ INTRAOCULAR LENS  IMPLANT, BILATERAL Bilateral   . ESOPHAGEAL DILATION  2003 and 3 yrs ago  . FRACTURE SURGERY    . MASTECTOMY     bilateral  . ORIF ANKLE FRACTURE Right 09/20/2012   Procedure: OPEN REDUCTION INTERNAL FIXATION (ORIF) ANKLE FRACTURE;  Surgeon: Johnn Hai, MD;  Location: [facility];  Service: Orthopedics;  Laterality: Right;  . PERIPHERAL VASCULAR INTERVENTION Left 10/21/2017  . PERIPHERAL VASCULAR INTERVENTION Left 10/21/2017   Procedure: PERIPHERAL VASCULAR INTERVENTION;  Surgeon: Waynetta Sandy, MD;  Location: [facility];  Service: Cardiovascular;  Laterality: Left;  . REVERSE SHOULDER ARTHROPLASTY Right 09/17/2012   Procedure: REVERSE SHOULDER ARTHROPLASTY;  Surgeon: Augustin Schooling, MD;  Location: [facility];  Service: Orthopedics;  Laterality: Right;  . SHOULDER OPEN ROTATOR CUFF REPAIR  11/05/2011   Procedure: ROTATOR CUFF REPAIR SHOULDER OPEN;  Surgeon: Tobi Bastos, MD;  Location: [facility];  Service: Orthopedics;  Laterality: Left;  Left Shoulder Rotator Cuff Repair Open with Graft and Anchors  . TONSILLECTOMY  age 80     OB History   None      Home Medications    Prior to Admission medications   Medication Sig Start Date End Date Taking? Authorizing Provider  acetaminophen (TYLENOL) 325 MG tablet Take 2 tablets (650 mg total) by mouth every 6 (six)  hours as needed for mild pain (or Fever >/= 101). 10/28/17  Yes Roxan Hockey, MD  acetaminophen (TYLENOL) 500 MG tablet Take 500 mg by mouth every 6 (six) hours as needed for mild pain, moderate pain or headache.    Yes [provider]  aspirin EC 81 MG tablet Take 162 mg by mouth 2 (two) times daily.    Yes [provider]  Calcium Carbonate-Vitamin D 600-200 MG-UNIT TABS Take 1 tablet by mouth 2 (two) times daily. Take 1 tablet daily for vitamin supplement.   Yes [provider]  collagenase (SANTYL) ointment Apply topically daily. 10/28/17  Yes Roxan Hockey, MD  diphenhydrAMINE (BENADRYL ALLERGY) 25 MG tablet Take 25 mg by mouth at bedtime as needed (rash due to Plavix).   Yes [provider]  ELIQUIS 2.5 MG TABS tablet TAKE 1 TABLET BY MOUTH TWICE DAILY. 06/11/17  Yes Lauree Chandler, NP  ferrous sulfate 325 (65 FE) MG tablet Take 325 mg by mouth 2 (two) times a week. Takes one tablet by mouth  every three days    Yes [provider]  fexofenadine (ALLEGRA ALLERGY) 180 MG tablet Take 1 tablet (180 mg total) by mouth daily. 10/28/17  Yes Roxan Hockey, MD  furosemide (LASIX) 20 MG tablet take 10-20 mg by mouth daily as needed for fluid--do not take while you are taking Bactrim antibiotic 10/28/17  Yes Roxan Hockey, MD  Glucosamine-Chondroit-Vit C-Mn (GLUCOSAMINE 1500 COMPLEX) CAPS Take one tablet once a day 01/12/13  Yes Bubba Camp, Mahima, MD  glucose blood (TRUETEST TEST) test strip Use to check blood sugar once daily Dx:E11.22 09/16/17  Yes Lauree Chandler, NP  lactobacillus (FLORANEX/LACTINEX) PACK Take 1 packet (1 g total) by mouth 3 (three) times daily with meals. 10/28/17  Yes Emokpae, Courage, MD  metFORMIN (GLUCOPHAGE) 500 MG tablet TAKE 1 TABLET BY MOUTH TWICE DAILY WITH A MEAL. 06/11/17  Yes Lauree Chandler, NP  metoprolol tartrate (LOPRESSOR) 25 MG tablet TAKE 1 TABLET BY MOUTH TWICE DAILY. 06/11/17  Yes Lauree Chandler, NP    Multiple Vitamin (MULITIVITAMIN WITH MINERALS) TABS Take 1 tablet by mouth daily.    Yes [provider]  Omega-3 Fatty Acids (FISH OIL) 1000 MG CAPS Take 1,000 mg by mouth daily.    Yes [provider]  ondansetron (ZOFRAN) 4 MG tablet Take 1 tablet (4 mg total) by mouth every 6 (six) hours as needed for nausea. 10/28/17  Yes Emokpae, Courage, MD  simvastatin (ZOCOR) 10 MG tablet TAKE ONE TABLET AT BEDTIME. Patient taking differently: TAKE ONE TABLET (10 mg)  AT BEDTIME. 06/11/17  Yes Lauree Chandler, NP  sulfamethoxazole-trimethoprim (BACTRIM DS,SEPTRA DS) 800-160 MG tablet Take 1 tablet by mouth 2 (two) times daily for 6 days. 10/28/17 11/03/17 Yes Emokpae, Courage, MD  TRUEPLUS LANCETS 30G MISC Use to check blood sugar once daily. Dx E11.9 06/13/15  Yes Lauree Chandler, NP    Family History Family History  Problem Relation Age of Onset  . Parkinson's disease Mother   . Hip fracture Mother   . Heart disease Mother   . Alzheimer's disease Mother   . Heart disease Father   . Parkinson's disease Sister   . Heart disease Brother     Social History Social History   Tobacco Use  . Smoking status: Former Smoker    Years: 50.00    Types: Pipe, Cigarettes    Last attempt to quit: 07/14/1996    Years since quitting: 21.3  . Smokeless tobacco: Never Used  Substance Use Topics  . Alcohol use: Yes    Alcohol/week: 0.6 oz    Types: 1 Cans of beer per week  . Drug use: No     Allergies   Morphine and related; Oxycodone; and Plavix [clopidogrel]   Review of Systems Review of Systems  Constitutional: Positive for fatigue. Negative for fever.  HENT: Negative for sore throat.   Eyes: Negative for visual disturbance.  Respiratory: Negative for shortness of breath.   Cardiovascular: Negative for chest pain.  Gastrointestinal: Negative for abdominal pain, nausea and vomiting.  Genitourinary: Negative for difficulty urinating.  Musculoskeletal: Positive for  arthralgias. Negative for back pain and neck stiffness.  Skin: Positive for rash and wound.  Neurological: Negative for syncope and headaches.     Physical Exam Updated Vital Signs BP 123/63   Pulse (!) 101   Temp (!) 97.3 F (36.3 C) (Oral)   Resp (!) 27   Ht 5\' 3"  (1.6 m)   Wt 56.7 kg (125 lb)   SpO2 100%  BMI 22.14 kg/m   Physical Exam  Constitutional: He is oriented to person, place, and time. He appears well-developed and well-nourished. No distress.  HENT:  Head: Normocephalic and atraumatic.  Eyes: Conjunctivae and EOM are normal.  Neck: Normal range of motion.  Cardiovascular: Normal rate, normal heart sounds and intact distal pulses. An irregularly irregular rhythm present. Exam reveals no gallop and no friction rub.  No murmur heard. Pulmonary/Chest: Effort normal and breath sounds normal. No respiratory distress. He has no wheezes. He has no rales.  Abdominal: Soft. He exhibits no distension. There is no tenderness. There is no guarding.  Musculoskeletal: He exhibits no edema.       Right hip: He exhibits decreased range of motion, tenderness, bony tenderness and deformity (right leg shortened and deformed).  LLE with chronic venous stasis ulcers, mild erythema, chronic skin changes Feet equal temperatures bilaterally, right foot with 2+ pulse, left with 1+  Neurological: He is alert and oriented to person, place, and time.  Skin: Skin is warm and dry. He is not diaphoretic.  Nursing note and vitals reviewed.    ED Treatments / Results  Labs (all labs ordered are listed, but only abnormal results are displayed) Labs Reviewed  CBC WITH DIFFERENTIAL/PLATELET - Abnormal; Notable for the following components:      Result Value   WBC 15.3 (*)    RBC 4.00 (*)    Hemoglobin 9.2 (*)    HCT 30.7 (*)    MCV 76.8 (*)    MCH 23.0 (*)    RDW 17.9 (*)    Neutro Abs 13.7 (*)    All other components within normal limits  COMPREHENSIVE METABOLIC PANEL - Abnormal;  Notable for the following components:   Sodium 133 (*)    CO2 16 (*)    Glucose, Bld 227 (*)    BUN 41 (*)    Creatinine, Ser 1.28 (*)    Total Protein 6.1 (*)    Albumin 3.0 (*)    GFR calc non Af Amer 46 (*)    GFR calc Af Amer 53 (*)    All other components within normal limits  PROTIME-INR - Abnormal; Notable for the following components:   Prothrombin Time 16.4 (*)    All other components within normal limits  CBG MONITORING, ED - Abnormal; Notable for the following components:   Glucose-Capillary 172 (*)    All other components within normal limits  URINE CULTURE  URINALYSIS, ROUTINE W REFLEX MICROSCOPIC  CBC  BASIC METABOLIC PANEL    EKG EKG Interpretation  Date/Time:  Saturday October 31 2017 09:20:04 EDT Ventricular Rate:  107 PR Interval:    QRS Duration: 113 QT Interval:  348 QTC Calculation: 465 R Axis:   71 Text Interpretation:  Sinus tachycardia with irregular rate IRBBB and LPFB Low voltage, precordial leads No significant change since last tracing Confirmed by Gareth Morgan [phone]) on 10/31/2017 6:14:40 PM   Radiology Dg Chest 1 View  Result Date: 10/31/2017 CLINICAL DATA:  84 year old male with right leg pain after falling at home EXAM: CHEST  1 VIEW COMPARISON:  Prior chest x-ray 04/26/2015 FINDINGS: Stable borderline cardiomegaly. Atherosclerotic calcifications again noted in the transverse aorta. Mediastinal contours remain unchanged. Small volume of free fluid now present along the minor fissure. Developing nodular airspace opacity in the right suprahilar region. Otherwise, unchanged appearance of the lungs with chronic bronchitic changes and mild interstitial prominence. No pneumothorax or pleural effusion. No acute osseous abnormality. Surgical changes of prior  right reverse shoulder arthroplasty. IMPRESSION: 1. Developing nodular opacity in the right suprahilar region may represent infiltrate, pulmonary nodule, or super hilar adenopathy. If clinically  warranted, CT scan of the chest with intravenous contrast could further evaluate. 2. Small fluid tracking along the minor fissure. 3. Otherwise, no acute cardiopulmonary process. 4.  Aortic Atherosclerosis (ICD10-170.0) Electronically Signed   By: Jacqulynn Cadet M.D.   On: 10/31/2017 11:03   Ct Head Wo Contrast  Result Date: 10/31/2017 CLINICAL DATA:  Fall today.  Initial encounter. EXAM: CT HEAD WITHOUT CONTRAST TECHNIQUE: Contiguous axial images were obtained from the base of the skull through the vertex without intravenous contrast. COMPARISON:  04/26/2015 FINDINGS: Brain: There is no evidence of acute infarct, intracranial hemorrhage, mass, or midline shift. Cerebral atrophy is unchanged and not greater than expected for age. Asymmetric extra-axial CSF overlying the left frontal lobe measures 8 mm in thickness, unchanged and compatible with a small subdural hygroma. Mildly prominent CSF along both sides of the falx near the vertex is also unchanged and may also represent small subdural hygromas or be related to atrophy. Cerebral white matter hypodensities are similar to the prior study and nonspecific but compatible with chronic small vessel ischemic disease, mild for age. Heterogeneous hypoattenuation in the deep gray nuclei also likely reflects chronic small vessel ischemia/chronic lacunar infarcts. Vascular: Calcified atherosclerosis at the skull base. No hyperdense vessel. Skull: No fracture or suspicious osseous lesion. Sinuses/Orbits: Minimal scattered mucosal thickening in the paranasal sinuses. Clear mastoid air cells. Bilateral cataract extraction. Other: None. IMPRESSION: No evidence of acute intracranial abnormality. Electronically Signed   By: Logan Bores M.D.   On: 10/31/2017 13:39   Ct Chest Wo Contrast  Result Date: 10/31/2017 CLINICAL DATA:  Pt got up to go to bathroom at home this morning and just got to weak and fell, denies hitting his head, has rt leg pain EXAM: CT CHEST WITHOUT  CONTRAST TECHNIQUE: Multidetector CT imaging of the chest was performed following the standard protocol without IV contrast. COMPARISON:  Chest x-ray 10/31/2017 FINDINGS: Cardiovascular: The heart is enlarged. There is dense calcification of the mitral annulus and coronary arteries. There is atherosclerotic calcification of the thoracic aorta not associated with aneurysm. Mediastinum/Nodes: The RIGHT lobe of the thyroid is enlarged and slightly heterogeneous without discrete mass. There numerous enlarged mediastinal lymph nodes. Precarinal lymph node is 12 millimeters in short axis. Enlarged prevascular lymph nodes are present, largest measuring 11 millimeters in short axis. Enlarged subcarinal lymph node is 2.2 centimeters. Assessment of the hilar lymph nodes is more limited due to lack of intravenous contrast. The esophagus is normal in appearance. Lungs/Pleura: There is pan lobular emphysema, most marked at the apices. A low-attenuation lobulated mass is identified within the RIGHT major fissure measuring 2.1 x 8.3 x 4.7 centimeters. By density measurements of -8 Hounsfield units, mass likely contains water and/or fat. There is mild septal wall thickening, consistent with edema. No suspicious pulmonary nodules. No consolidations. Trace bilateral pleural effusions. No pneumothorax. There are bilateral subpleural reticular changes. Upper Abdomen: Contour of the liver appears nodular raising the question of cirrhosis. LEFT adrenal nodule is 3.3 x 3.8 centimeters, and measures low density, consistent with benign adenoma. RIGHT adrenal gland is normal in appearance. Musculoskeletal: Remote rib fractures. No evidence for acute RIGHT shoulder arthroplasty. Abnormality. IMPRESSION: 1. Lobulated mass within the major fissure on the RIGHT measuring 8.3 x 2.1 x 4.7 centimeters. Findings are favored to represent a pseudotumor related to previous pleural effusion. Less likely, this  could represent a pleural lipoma or  loculated hematoma. Recommend continued radiographic or CT follow-up to document decreased size. 2. Enlarged mediastinal lymph nodes warrant follow-up. Follow-up chest CT is recommended in 6 months. Intravenous contrast is recommended unless contraindicated for optimal evaluation of the hilar regions. 3. Cardiomegaly.  Coronary artery calcification. 4. Mild septal thickening consistent with mild edema. 5.  Aortic atherosclerosis.  (ICD10-I70.0) 6.  Emphysema (ICD10-J43.9) 7. Question of hepatic cirrhosis. 8. LEFT adrenal adenoma. 9. Remote rib fractures.  No acute injury.  No pneumothorax. Electronically Signed   By: Nolon Nations M.D.   On: 10/31/2017 14:29   Ct Hip Right Wo Contrast  Result Date: 10/31/2017 CLINICAL DATA:  Patient status post fall with a right hip fracture today. Preoperative imaging. Initial encounter. EXAM: CT OF THE RIGHT HIP WITHOUT CONTRAST TECHNIQUE: Multidetector CT imaging of the right hip was performed according to the standard protocol. Multiplanar CT image reconstructions were also generated. COMPARISON:  Plain films right hip this same day. FINDINGS: Bones/Joint/Cartilage There is a subcapital fracture of the right hip. The femoral neck directed anteriorly due to posterior rotation of the greater trochanter. There is approximately 1/2 shaft width lateral/anterior displacement of the femoral neck. No intertrochanteric involvement is identified. No other fracture is seen. Degenerative disc disease lower lumbar spine noted. No lytic or sclerotic lesion is identified. Ligaments Suboptimally assessed by CT. Muscles and Tendons There is fatty atrophy of the gluteus minimus. Soft tissues Subcutaneous soft tissue contusion over the hip noted. Atherosclerotic vascular disease identified. IMPRESSION: Subcapital fracture right hip. The femoral neck fracture is directed anteriorly due to posterior rotation of the greater trochanter. Electronically Signed   By: Inge Rise M.D.   On:  10/31/2017 15:02   Dg Hip Unilat  With Pelvis 2-3 Views Right  Result Date: 10/31/2017 CLINICAL DATA:  Right hip pain due to a fall today. Initial encounter. EXAM: DG HIP (WITH OR WITHOUT PELVIS) 2-3V RIGHT COMPARISON:  Plain films of the left hip and pelvis 04/26/2015. FINDINGS: The patient has an acute subcapital fracture of the right hip. No other acute abnormality is identified. Bones are osteopenic. Prominent stool ball in the rectum noted. IMPRESSION: Acute subcapital fracture right hip. Osteopenia. Electronically Signed   By: Inge Rise M.D.   On: 10/31/2017 11:01    Procedures Procedures (including critical care time)  Medications Ordered in ED Medications  acetaminophen (TYLENOL) tablet 650 mg (has no administration in time range)  calcium-vitamin D (OSCAL WITH D) 500-200 MG-UNIT per tablet 1 tablet (has no administration in time range)  diphenhydrAMINE (BENADRYL) tablet 25 mg (has no administration in time range)  ferrous sulfate tablet 325 mg (has no administration in time range)  loratadine (CLARITIN) tablet 10 mg (10 mg Oral Refused 10/31/17 1640)  lactobacillus (FLORANEX/LACTINEX) granules 1 g (has no administration in time range)  metoprolol tartrate (LOPRESSOR) tablet 25 mg (has no administration in time range)  simvastatin (ZOCOR) tablet 10 mg (has no administration in time range)  sulfamethoxazole-trimethoprim (BACTRIM DS,SEPTRA DS) 800-160 MG per tablet 1 tablet (has no administration in time range)  morphine 4 MG/ML injection 0.52 mg (has no administration in time range)  0.9 %  sodium chloride infusion ( Intravenous Transfusing/Transfer 10/31/17 1452)  methocarbamol (ROBAXIN) tablet 500 mg (500 mg Oral Given 10/31/17 1426)    Or  methocarbamol (ROBAXIN) 500 mg in dextrose 5 % 50 mL IVPB ( Intravenous See Alternative 10/31/17 1426)  docusate sodium (COLACE) capsule 100 mg (100 mg Oral Given 10/31/17  1640)  insulin aspart (novoLOG) injection 0-15 Units (has no  administration in time range)  insulin aspart (novoLOG) injection 0-5 Units (has no administration in time range)  fentaNYL (SUBLIMAZE) injection 25 mcg (25 mcg Intravenous Given 10/31/17 1639)  heparin injection 5,000 Units (5,000 Units Subcutaneous Given 10/31/17 1640)  fentaNYL (SUBLIMAZE) injection 50 mcg (50 mcg Intravenous Given 10/31/17 0944)  fentaNYL (SUBLIMAZE) injection 50 mcg (50 mcg Intravenous Given 10/31/17 1214)  fentaNYL (SUBLIMAZE) injection 50 mcg (50 mcg Intravenous Given 10/31/17 1741)     Initial Impression / Assessment and Plan / ED Course  I have reviewed the triage vital signs and the nursing notes.  Pertinent labs & imaging results that were available during my care of the patient were reviewed by me and considered in my medical decision making (see chart for details).      84 year old male with a history of hypertension, DM, peripheral vascular disease, arthritis, venous stasis ulcers, recent admission with a left lower extremity cellulitis, afib on eliquis, who presents with concern for fall.  CT head and CXR without acute abnormalities.  Prior cellulitis of LLE appears to be improving, mild residual erythema appears chronic.  Pt tachycardic with atrial fibrillation, rate improved in ED. Leukocytosis may be secondary to trauma or underlying infection.  Anemia chronic.  Mild elevation in Cr in comparison to recent.    XR shows right hip fracture. Patient NV intact. Dr. Stann Mainland of Orthopedics was consulted and evaluated the patient at bedside. Family requesting Dr. Alvan Dame as Orthopedic surgeon, and Dr. Stann Mainland arranged surgery for him Monday afternoon at [facility].  Pt awaiting transfer. UA pending.   Final Clinical Impressions(s) / ED Diagnoses   Final diagnoses:  Fall, initial encounter  Closed fracture of right hip, initial encounter ([place])  Leukocytosis, unspecified type  Atrial fibrillation, unspecified type [facility])    ED Discharge Orders    None         Gareth Morgan, MD 10/31/17 [place] [place]

## 2017-10-31 NOTE — H&P (Signed)
History and Physical    Logan Bean VOJ:[id number] DOB: 10/20/1920 DOA: 10/31/2017  PCP: Lauree Chandler, NP Patient coming from: home  Chief Complaint: right hip pain  HPI: Logan Bean is a very pleasant 84 y.o. male with medical history significant for hypertension, PVD, arthritis, varicose veins with venous stasis ulcers status post stenting on left SFA, A. Fib CHF, kidney disease,presents to the emergency department with chief complaint of right hip pain after fall earlier this morning. Initial evaluation reveals right hip fracture and [facility] are asked to admit.  Information is obtained from the patient and the chart. He states he got up early this morning ambulated to the bathroom and on his way back he "ran out of gas" and fell. He denies hitting his head or losing consciousness. He denies any dizziness syncope or near-syncope. He denies chest pain palpitation shortness of breath. He denies abdominal pain nausea vomiting diarrhea constipation melena bright red blood per rectum. Of note he was just discharged from the hospital 2 days ago after being treated for left leg cellulitis. He does report decreased energy and decreased oral intake since his discharge home. He denies any dysuria hematuria frequency or urgency.   ED Course: in the emergency department he is afebrile with a blood pressure at the high end of normal mildly tachycardic he's not hypoxic. He is provided with fentanyl in the emergency department  Review of Systems: As per HPI otherwise all other systems reviewed and are negative.   Ambulatory Status: recently ambulating with a walker using an unsteady gait  Past Medical History:  Diagnosis Date  . Anxiety   . Arthritis    "probably all over" (10/21/2017)  . Depression   . Diaphragmatic hernia without mention of obstruction or gangrene   . Dizziness and giddiness   . Edema   . GERD (gastroesophageal reflux disease)   . H/O hiatal hernia   .  History of gout   . History of kidney stones   . History of stomach ulcers   . Hypercholesteremia   . Hypertension   . Left rotator cuff tear Jul 12, 2012  . PVD (peripheral vascular disease) ([place])   . Stomach ulcer    years  . Type II diabetes mellitus ([place])   . Unspecified hereditary and idiopathic peripheral neuropathy   . Unspecified vitamin D deficiency   . Varicose veins    both legs    Past Surgical History:  Procedure Laterality Date  . ABDOMINAL AORTOGRAM W/LOWER EXTREMITY N/A 10/21/2017   Procedure: ABDOMINAL AORTOGRAM W/LOWER EXTREMITY - Left;  Surgeon: Waynetta Sandy, MD;  Location: [facility];  Service: Cardiovascular;  Laterality: N/A;  . CARDIOVERSION N/A 02/22/2013   Procedure: CARDIOVERSION;  Surgeon: Darlin Coco, MD;  Location: [facility];  Service: Cardiovascular;  Laterality: N/A;  . CATARACT EXTRACTION W/ INTRAOCULAR LENS  IMPLANT, BILATERAL Bilateral   . ESOPHAGEAL DILATION  2003 and 3 yrs ago  . FRACTURE SURGERY    . MASTECTOMY     bilateral  . ORIF ANKLE FRACTURE Right 09/20/2012   Procedure: OPEN REDUCTION INTERNAL FIXATION (ORIF) ANKLE FRACTURE;  Surgeon: Johnn Hai, MD;  Location: [facility];  Service: Orthopedics;  Laterality: Right;  . PERIPHERAL VASCULAR INTERVENTION Left 10/21/2017  . PERIPHERAL VASCULAR INTERVENTION Left 10/21/2017   Procedure: PERIPHERAL VASCULAR INTERVENTION;  Surgeon: Waynetta Sandy, MD;  Location: [facility];  Service: Cardiovascular;  Laterality: Left;  . REVERSE SHOULDER ARTHROPLASTY Right 09/17/2012   Procedure:  REVERSE SHOULDER ARTHROPLASTY;  Surgeon: Augustin Schooling, MD;  Location: [facility];  Service: Orthopedics;  Laterality: Right;  . SHOULDER OPEN ROTATOR CUFF REPAIR  11/05/2011   Procedure: ROTATOR CUFF REPAIR SHOULDER OPEN;  Surgeon: Tobi Bastos, MD;  Location: [facility];  Service: Orthopedics;  Laterality: Left;  Left Shoulder Rotator Cuff Repair Open with Graft and Anchors  .  TONSILLECTOMY  age 75    Social History   Socioeconomic History  . Marital status: Widowed    Spouse name: Not on file  . Number of children: 0  . Years of education: Not on file  . Highest education level: Not on file  Occupational History  . Not on file  Social Needs  . Financial resource strain: Not hard at all  . Food insecurity:    Worry: Never true    Inability: Never true  . Transportation needs:    Medical: No    Non-medical: No  Tobacco Use  . Smoking status: Former Smoker    Years: 50.00    Types: Pipe, Cigarettes    Last attempt to quit: 07/14/1996    Years since quitting: 21.3  . Smokeless tobacco: Never Used  Substance and Sexual Activity  . Alcohol use: Yes    Alcohol/week: 0.6 oz    Types: 1 Cans of beer per week  . Drug use: No  . Sexual activity: Never  Lifestyle  . Physical activity:    Days per week: 0 days    Minutes per session: 0 min  . Stress: Not at all  Relationships  . Social connections:    Talks on phone: More than three times a week    Gets together: More than three times a week    Attends religious service: Never    Active member of club or organization: No    Attends meetings of clubs or organizations: Never    Relationship status: Widowed  . Intimate partner violence:    Fear of current or ex partner: No    Emotionally abused: No    Physically abused: No    Forced sexual activity: No  Other Topics Concern  . Not on file  Social History Narrative   Lives alone.    Allergies  Allergen Reactions  . Morphine And Related Nausea And Vomiting  . Oxycodone Nausea And Vomiting  . Plavix [Clopidogrel] Rash    Rash on upper body    Family History  Problem Relation Age of Onset  . Parkinson's disease Mother   . Hip fracture Mother   . Heart disease Mother   . Alzheimer's disease Mother   . Heart disease Father   . Parkinson's disease Sister   . Heart disease Brother     Prior to Admission medications   Medication Sig  Start Date End Date Taking? Authorizing Provider  acetaminophen (TYLENOL) 325 MG tablet Take 2 tablets (650 mg total) by mouth every 6 (six) hours as needed for mild pain (or Fever >/= 101). 10/28/17   Roxan Hockey, MD  acetaminophen (TYLENOL) 500 MG tablet Take 500 mg by mouth every 6 (six) hours as needed for mild pain, moderate pain or headache.     [provider]  aspirin EC 81 MG tablet Take 162 mg by mouth 2 (two) times daily.     [provider]  Calcium Carbonate-Vitamin D 600-200 MG-UNIT TABS Take 1 tablet by mouth 2 (two) times daily. Take 1 tablet daily for vitamin supplement.    [provider]  collagenase (SANTYL) ointment Apply topically daily. 10/28/17   Roxan Hockey, MD  diphenhydrAMINE (BENADRYL ALLERGY) 25 MG tablet Take 25 mg by mouth at bedtime as needed (rash due to Plavix).    [provider]  ELIQUIS 2.5 MG TABS tablet TAKE 1 TABLET BY MOUTH TWICE DAILY. 06/11/17   Lauree Chandler, NP  ferrous sulfate 325 (65 FE) MG tablet Take 325 mg by mouth 2 (two) times a week. Takes one tablet by mouth every three days     [provider]  fexofenadine (ALLEGRA ALLERGY) 180 MG tablet Take 1 tablet (180 mg total) by mouth daily. 10/28/17   Roxan Hockey, MD  furosemide (LASIX) 20 MG tablet take 10-20 mg by mouth daily as needed for fluid--do not take while you are taking Bactrim antibiotic 10/28/17   Roxan Hockey, MD  Glucosamine-Chondroit-Vit C-Mn (GLUCOSAMINE 1500 COMPLEX) CAPS Take one tablet once a day 01/12/13   Blanchie Serve, MD  glucose blood (TRUETEST TEST) test strip Use to check blood sugar once daily Dx:E11.22 09/16/17   Lauree Chandler, NP  lactobacillus (FLORANEX/LACTINEX) PACK Take 1 packet (1 g total) by mouth 3 (three) times daily with meals. 10/28/17   Roxan Hockey, MD  metFORMIN (GLUCOPHAGE) 500 MG tablet TAKE 1 TABLET BY MOUTH TWICE DAILY WITH A MEAL. 06/11/17   Lauree Chandler, NP  metoprolol tartrate  (LOPRESSOR) 25 MG tablet TAKE 1 TABLET BY MOUTH TWICE DAILY. 06/11/17   Lauree Chandler, NP  Multiple Vitamin (MULITIVITAMIN WITH MINERALS) TABS Take 1 tablet by mouth daily.     [provider]  Omega-3 Fatty Acids (FISH OIL) 1000 MG CAPS Take 1,000 mg by mouth daily.     [provider]  ondansetron (ZOFRAN) 4 MG tablet Take 1 tablet (4 mg total) by mouth every 6 (six) hours as needed for nausea. 10/28/17   Roxan Hockey, MD  simvastatin (ZOCOR) 10 MG tablet TAKE ONE TABLET AT BEDTIME. Patient taking differently: TAKE ONE TABLET (10 mg)  AT BEDTIME. 06/11/17   Lauree Chandler, NP  sulfamethoxazole-trimethoprim (BACTRIM DS,SEPTRA DS) 800-160 MG tablet Take 1 tablet by mouth 2 (two) times daily for 6 days. 10/28/17 11/03/17  Roxan Hockey, MD  TRUEPLUS LANCETS 30G MISC Use to check blood sugar once daily. Dx E11.9 06/13/15   Lauree Chandler, NP    Physical Exam: Vitals:   10/31/17 1100 10/31/17 1130 10/31/17 1200 10/31/17 1230  BP: 125/68 (!) 148/77  133/68  Pulse: 100  (!) 101 64  Resp: (!) 21 (!) 21 (!) 24 16  Temp:      TempSrc:      SpO2: 94% 99% 95% 94%  Weight:      Height:         General:  Appears slightly anxious somewhat uncomfortable lying flat in bed Eyes:  PERRL, EOMI, normal lids, iris ENT:  grossly normal hearing, lips & tongue, his membranes of his mouth are pink but dry Neck:  no LAD, masses or thyromegaly Cardiovascular:  RRR, sounds somewhat distant no m/r/g. trace LE edema bilaterally with resolving cellulitis on the left between the knee and the ankle, 1 fluid-filled blisters located mid shin on left Respiratory:  Mild increased work of breathing with conversation. Breath Sounds distant no crackles or wheezes Abdomen:  soft, ntnd, positive bowel sounds no guarding or rebounding Skin:  no rash or induration seen on limited exam is altering cellulitis on left lower extremity. Some venous stasis ulcers on right Musculoskeletal:  right  leg externally rotated and shorter than left. Right hip quite tender to palpation no range of motion secondary to pain Psychiatric:  grossly normal mood and affect, speech fluent and appropriate, AOx3 Neurologic:  CN 2-12 grossly intact, moves all extremities in coordinated fashion, sensation intact alert an oriented to place and person. Follows commands able to make wants and needs known.  Labs on Admission: I have personally reviewed following labs and imaging studies  CBC: Recent Labs  Lab 10/25/17 2245 10/26/17 0410 10/28/17 0318 10/31/17 1007  WBC 8.7 8.2 9.8 15.3*  NEUTROABS 6.9  --   --  13.7*  HGB 10.6* 9.4* 8.6* 9.2*  HCT 35.5* 31.2* 28.1* 30.7*  MCV 79.4 76.8* 77.6* 76.8*  PLT 275 218 213 932   Basic Metabolic Panel: Recent Labs  Lab 10/25/17 2245 10/26/17 0410 10/28/17 0318 10/31/17 1007  NA 139 136 137 133*  K 4.3 4.8 4.4 5.0  CL 106 107 108 106  CO2 23 18* 20* 16*  GLUCOSE 175* 161* 136* 227*  BUN 45* 40* 29* 41*  CREATININE 0.92 0.95 1.03 1.28*  CALCIUM 9.4 8.7* 8.6* 9.5   GFR: Estimated Creatinine Clearance: 27.7 mL/min (A) (by C-G formula based on SCr of 1.28 mg/dL (H)). Liver Function Tests: Recent Labs  Lab 10/31/17 1007  AST 25  ALT 27  ALKPHOS 88  BILITOT 0.9  PROT 6.1*  ALBUMIN 3.0*   No results for input(s): LIPASE, AMYLASE in the last 168 hours. No results for input(s): AMMONIA in the last 168 hours. Coagulation Profile: Recent Labs  Lab 10/31/17 1007  INR 1.33   Cardiac Enzymes: No results for input(s): CKTOTAL, CKMB, CKMBINDEX, TROPONINI in the last 168 hours. BNP (last 3 results) No results for input(s): PROBNP in the last 8760 hours. HbA1C: No results for input(s): HGBA1C in the last 72 hours. CBG: Recent Labs  Lab 10/27/17 1730 10/27/17 2222 10/28/17 0746 10/28/17 1150 10/28/17 1639  GLUCAP 100* 195* 129* 183* 138*   Lipid Profile: No results for input(s): CHOL, HDL, LDLCALC, TRIG, CHOLHDL, LDLDIRECT in the last 72  hours. Thyroid Function Tests: No results for input(s): TSH, T4TOTAL, FREET4, T3FREE, THYROIDAB in the last 72 hours. Anemia Panel: No results for input(s): VITAMINB12, FOLATE, FERRITIN, TIBC, IRON, RETICCTPCT in the last 72 hours. Urine analysis:    Component Value Date/Time   COLORURINE YELLOW 04/26/2015 2110   APPEARANCEUR CLEAR 04/26/2015 2110   LABSPEC 1.018 04/26/2015 2110   PHURINE 6.0 04/26/2015 2110   GLUCOSEU 250 (A) 04/26/2015 2110   HGBUR TRACE (A) 04/26/2015 2110   BILIRUBINUR NEGATIVE 04/26/2015 2110   KETONESUR NEGATIVE 04/26/2015 2110   PROTEINUR NEGATIVE 04/26/2015 2110   UROBILINOGEN 0.2 04/26/2015 2110   NITRITE NEGATIVE 04/26/2015 2110   LEUKOCYTESUR NEGATIVE 04/26/2015 2110    Creatinine Clearance: Estimated Creatinine Clearance: 27.7 mL/min (A) (by C-G formula based on SCr of 1.28 mg/dL (H)).  Sepsis Labs: @LABRCNTIP (procalcitonin:4,lacticidven:4) ) Recent Results (from the past 240 hour(s))  Blood culture (routine x 2)     Status: None (Preliminary result)   Collection Time: 10/26/17  4:10 AM  Result Value Ref Range Status   Specimen Description   Final    BLOOD RIGHT FOREARM Performed at [facility] [address], [place], [place] [address]    Special Requests   Final    BOTTLES DRAWN AEROBIC ONLY Blood Culture adequate volume Performed at [facility] [address] [place], [place] [address]    Culture  Final    NO GROWTH 4 DAYS Performed at [facility], [place] [address], [place], [place] [address]    Report Status PENDING  Incomplete  Blood culture (routine x 2)     Status: None (Preliminary result)   Collection Time: 10/26/17  4:10 AM  Result Value Ref Range Status   Specimen Description   Final    BLOOD RIGHT ANTECUBITAL Performed at [facility] [address], [place], [place] [address]    Special Requests   Final    BOTTLES DRAWN AEROBIC ONLY Blood Culture adequate  volume Performed at [facility] [address] [place], [place] [address]    Culture   Final    NO GROWTH 4 DAYS Performed at [facility], [place] [address], [place], [place] [address]    Report Status PENDING  Incomplete  Aerobic Culture (superficial specimen)     Status: None   Collection Time: 10/26/17  9:40 AM  Result Value Ref Range Status   Specimen Description   Final    WOUND LEFT LEG Performed at [facility] [address], [place], [place] [address]    Special Requests   Final    Normal Performed at [facility], [place] [address] [place], [place] [address]    Gram Stain   Final    NO WBC SEEN NO ORGANISMS SEEN Performed at [facility], [place] [address] [place], [place] [address]    Culture   Final    MODERATE METHICILLIN RESISTANT STAPHYLOCOCCUS AUREUS   Report Status 10/28/2017 FINAL  Final   Organism ID, Bacteria METHICILLIN RESISTANT STAPHYLOCOCCUS AUREUS  Final      Susceptibility   Methicillin resistant staphylococcus aureus - MIC*    CIPROFLOXACIN >=8 RESISTANT Resistant     ERYTHROMYCIN >=8 RESISTANT Resistant     GENTAMICIN <=0.5 SENSITIVE Sensitive     OXACILLIN >=4 RESISTANT Resistant     TETRACYCLINE >=16 RESISTANT Resistant     VANCOMYCIN 1 SENSITIVE Sensitive     TRIMETH/SULFA <=10 SENSITIVE Sensitive     CLINDAMYCIN <=0.25 SENSITIVE Sensitive     RIFAMPIN <=0.5 SENSITIVE Sensitive     Inducible Clindamycin NEGATIVE Sensitive     * MODERATE METHICILLIN RESISTANT STAPHYLOCOCCUS AUREUS     Radiological Exams on Admission: Dg Chest 1 View  Result Date: 10/31/2017 CLINICAL DATA:  84 year old male with right leg pain after falling at home EXAM: CHEST  1 VIEW COMPARISON:  Prior chest x-ray 04/26/2015 FINDINGS: Stable borderline cardiomegaly. Atherosclerotic calcifications again noted in the transverse aorta. Mediastinal contours remain unchanged. Small volume of free fluid now  present along the minor fissure. Developing nodular airspace opacity in the right suprahilar region. Otherwise, unchanged appearance of the lungs with chronic bronchitic changes and mild interstitial prominence. No pneumothorax or pleural effusion. No acute osseous abnormality. Surgical changes of prior right reverse shoulder arthroplasty. IMPRESSION: 1. Developing nodular opacity in the right suprahilar region may represent infiltrate, pulmonary nodule, or super hilar adenopathy. If clinically warranted, CT scan of the chest with intravenous contrast could further evaluate. 2. Small fluid tracking along the minor fissure. 3. Otherwise, no acute cardiopulmonary process. 4.  Aortic Atherosclerosis (ICD10-170.0) Electronically Signed   By: Jacqulynn Cadet M.D.   On: 10/31/2017 11:03   Dg Hip Unilat  With Pelvis 2-3 Views Right  Result Date: 10/31/2017 CLINICAL DATA:  Right hip pain due to a fall today. Initial encounter. EXAM: DG HIP (WITH  OR WITHOUT PELVIS) 2-3V RIGHT COMPARISON:  Plain films of the left hip and pelvis 04/26/2015. FINDINGS: The patient has an acute subcapital fracture of the right hip. No other acute abnormality is identified. Bones are osteopenic. Prominent stool ball in the rectum noted. IMPRESSION: Acute subcapital fracture right hip. Osteopenia. Electronically Signed   By: Inge Rise M.D.   On: 10/31/2017 11:01    EKG: Sinus tachycardia with irregular rate IRBBB and LPFB Low voltage, precordial leads  Assessment/Plan Principal Problem:   Hip fracture (HCC) Active Problems:   Dyslipidemia   GERD (gastroesophageal reflux disease)   CHF (congestive heart failure) (HCC)   Chronic atrial fibrillation (HCC)   Chronic anticoagulation-Eliquis   CKD (chronic kidney disease) stage 3, GFR 30-59 ml/min (HCC)   Fall   Type 2 diabetes mellitus with diabetic chronic kidney disease (HCC)   Iron deficiency anemia secondary to inadequate dietary iron intake   Cellulitis   Abnormal  chest x-ray   #1. Hip fracture on right secondary to presumably mechanical fall during the night. Emergency department provider discussed with orthopedics who opined likely surgery in 2 days or so. Chest x-ray INR and EKG done -Admit -pain management -Hold eliquis -robaxin  2. Abnormal chest x-ray. Chest x-ray with developing opacity in right suprahilar region concerning for infiltrate. He hasa mild leukocytosis he's afebrile hemodynamically stable and nontoxic appearing. Oxygen saturation level greater than 90% on room air -obtain a noncontrast chest CT for further evaluation -When necessary oxygen -When necessary nebs -Monitor  #3. Chronic A. Fib. EKG as noted above. chadscore 5. Home medications include L request, Toprol -hold eliquis -continue metoprolol  #4. Chronic kidney disease stage III. Creatinine 1.28 on admission. This appears to be a little above his baseline. -entle IV fluids -Hold nephrotoxins. Of note home medications included lisinopril which was placed on hold at discharge 2 days ago until he completed his Bactrim course -Monitor urine output -Recheck in the morning  #6. Diabetes. Glucose 227 on admission. Home medications include metformin.and 11 A1c in January 2019 7.4 -Hold oral agents -obtain a hemoglobin A1c -Sliding scale insulin for optimal control -carb modified diet  #7. Left lower extremity MRSA cellulitis/infected wounds. Discharged 2 days ago with Keflex for 7 days.Of note he was hospitalized for same and received IV vancomycin and Zosynand wound cultures with MRSA. Appears to be resolving on admission -continue Bactrim  #8. Hypertension. Fair control in the emergency department. Home medications include lasix, lisinopril, metoprolol. Chart review indicates Lasix and lisinopril on hold until course of Bactrim completed -Continue metoprolol -Monitor  #9. Iron deficiency anemia. 11 9.2 on admission. This appears to be close to his baseline. No signs  symptoms of active bleeding -Monitor    DVT prophylaxis: scd  Code Status: dnr  Family Communication: none present  Disposition Plan: snf  Consults called: [facility]  Admission status: inpatient    Radene Gunning MD [facility]  If 7PM-7AM, please contact night-coverage [email or web address] Password [email or web address]  10/31/2017, 1:03 PM

## 2017-10-31 NOTE — ED Notes (Signed)
Patient transported to CT 

## 2017-10-31 NOTE — ED Notes (Signed)
Heart healthy carb modified dinner tray ordered 

## 2017-10-31 NOTE — ED Notes (Signed)
Pt transported to CT ?

## 2017-10-31 NOTE — ED Notes (Signed)
Attempted Report 

## 2017-10-31 NOTE — ED Triage Notes (Signed)
Pt from home post mechanical fall. Pt complaining of right leg pain. Pt is being treated for cellulitis of left leg

## 2017-10-31 NOTE — ED Notes (Signed)
Pt returns from radiology. 

## 2017-10-31 NOTE — ED Notes (Signed)
Patient transported to X-ray 

## 2017-10-31 NOTE — Consult Note (Signed)
ORTHOPAEDIC CONSULTATION  REQUESTING PHYSICIAN: Oren Binet*  PCP:  Lauree Chandler, NP  Chief Complaint: Right hip pain  HPI: Logan Bean is a 84 y.o. male who complains of right hip pain and inability to weight-bear following a ground-level fall early this morning around 130 am.  He lives in a "minimal" suite with some friends that are his healthcare power of attorney.  They state that he is independent with all ADLs and ambulates without any assistive devices.  He does have some left knee osteoarthritis is being managed conservatively.  He thinks that the left knee buckled and precipitated this fall.  Of note he has recently been diagnosed with diabetes about 7 or so years ago.  His last A1c was 7.4.  He also has chronic atrial fibrillation that requires him to be on Eliquis.   Currently he is complaining of right hip pain and denies any numbness or tingling.  Of note on his left lower extremity has been dealing with some chronic wounds that are managed at this time with outpatient wound care.  These are healing fairly well with this conservative care.  Also of note while transferring him from his floor to the EMS he sustained a right pretibial leg skin tear. Past Medical History:  Diagnosis Date  . Anxiety   . Arthritis    "probably all over" (10/21/2017)  . Depression   . Diaphragmatic hernia without mention of obstruction or gangrene   . Dizziness and giddiness   . Edema   . GERD (gastroesophageal reflux disease)   . H/O hiatal hernia   . History of gout   . History of kidney stones   . History of stomach ulcers   . Hypercholesteremia   . Hypertension   . Left rotator cuff tear Jul 12, 2012  . PVD (peripheral vascular disease) ([place])   . Stomach ulcer    years  . Type II diabetes mellitus ([place])   . Unspecified hereditary and idiopathic peripheral neuropathy   . Unspecified vitamin D deficiency   . Varicose veins    both legs   Past Surgical History:    Procedure Laterality Date  . ABDOMINAL AORTOGRAM W/LOWER EXTREMITY N/A 10/21/2017   Procedure: ABDOMINAL AORTOGRAM W/LOWER EXTREMITY - Left;  Surgeon: Waynetta Sandy, MD;  Location: [facility];  Service: Cardiovascular;  Laterality: N/A;  . CARDIOVERSION N/A 02/22/2013   Procedure: CARDIOVERSION;  Surgeon: Darlin Coco, MD;  Location: [facility];  Service: Cardiovascular;  Laterality: N/A;  . CATARACT EXTRACTION W/ INTRAOCULAR LENS  IMPLANT, BILATERAL Bilateral   . ESOPHAGEAL DILATION  2003 and 3 yrs ago  . FRACTURE SURGERY    . MASTECTOMY     bilateral  . ORIF ANKLE FRACTURE Right 09/20/2012   Procedure: OPEN REDUCTION INTERNAL FIXATION (ORIF) ANKLE FRACTURE;  Surgeon: Johnn Hai, MD;  Location: [facility];  Service: Orthopedics;  Laterality: Right;  . PERIPHERAL VASCULAR INTERVENTION Left 10/21/2017  . PERIPHERAL VASCULAR INTERVENTION Left 10/21/2017   Procedure: PERIPHERAL VASCULAR INTERVENTION;  Surgeon: Waynetta Sandy, MD;  Location: [facility];  Service: Cardiovascular;  Laterality: Left;  . REVERSE SHOULDER ARTHROPLASTY Right 09/17/2012   Procedure: REVERSE SHOULDER ARTHROPLASTY;  Surgeon: Augustin Schooling, MD;  Location: [facility];  Service: Orthopedics;  Laterality: Right;  . SHOULDER OPEN ROTATOR CUFF REPAIR  11/05/2011   Procedure: ROTATOR CUFF REPAIR SHOULDER OPEN;  Surgeon: Tobi Bastos, MD;  Location: [facility];  Service: Orthopedics;  Laterality: Left;  Left Shoulder Rotator Cuff Repair Open with Graft and Anchors  . TONSILLECTOMY  age 65   Social History   Socioeconomic History  . Marital status: Widowed    Spouse name: Not on file  . Number of children: 0  . Years of education: Not on file  . Highest education level: Not on file  Occupational History  . Not on file  Social Needs  . Financial resource strain: Not hard at all  . Food insecurity:    Worry: Never true    Inability: Never true  . Transportation needs:    Medical: No     Non-medical: No  Tobacco Use  . Smoking status: Former Smoker    Years: 50.00    Types: Pipe, Cigarettes    Last attempt to quit: 07/14/1996    Years since quitting: 21.3  . Smokeless tobacco: Never Used  Substance and Sexual Activity  . Alcohol use: Yes    Alcohol/week: 0.6 oz    Types: 1 Cans of beer per week  . Drug use: No  . Sexual activity: Never  Lifestyle  . Physical activity:    Days per week: 0 days    Minutes per session: 0 min  . Stress: Not at all  Relationships  . Social connections:    Talks on phone: More than three times a week    Gets together: More than three times a week    Attends religious service: Never    Active member of club or organization: No    Attends meetings of clubs or organizations: Never    Relationship status: Widowed  Other Topics Concern  . Not on file  Social History Narrative   Lives alone.   Family History  Problem Relation Age of Onset  . Parkinson's disease Mother   . Hip fracture Mother   . Heart disease Mother   . Alzheimer's disease Mother   . Heart disease Father   . Parkinson's disease Sister   . Heart disease Brother    Allergies  Allergen Reactions  . Morphine And Related Nausea And Vomiting  . Oxycodone Nausea And Vomiting  . Plavix [Clopidogrel] Rash    Rash on upper body   Prior to Admission medications   Medication Sig Start Date End Date Taking? Authorizing Provider  acetaminophen (TYLENOL) 325 MG tablet Take 2 tablets (650 mg total) by mouth every 6 (six) hours as needed for mild pain (or Fever >/= 101). 10/28/17  Yes Roxan Hockey, MD  acetaminophen (TYLENOL) 500 MG tablet Take 500 mg by mouth every 6 (six) hours as needed for mild pain, moderate pain or headache.    Yes [provider]  aspirin EC 81 MG tablet Take 162 mg by mouth 2 (two) times daily.    Yes [provider]  Calcium Carbonate-Vitamin D 600-200 MG-UNIT TABS Take 1 tablet by mouth 2 (two) times daily. Take 1 tablet  daily for vitamin supplement.   Yes [provider]  collagenase (SANTYL) ointment Apply topically daily. 10/28/17  Yes Roxan Hockey, MD  diphenhydrAMINE (BENADRYL ALLERGY) 25 MG tablet Take 25 mg by mouth at bedtime as needed (rash due to Plavix).   Yes [provider]  ELIQUIS 2.5 MG TABS tablet TAKE 1 TABLET BY MOUTH TWICE DAILY. 06/11/17  Yes Lauree Chandler, NP  ferrous sulfate 325 (65 FE) MG tablet Take 325 mg by mouth 2 (two) times a week. Takes one tablet by mouth every three days    Yes  [provider]  fexofenadine (ALLEGRA ALLERGY) 180 MG tablet Take 1 tablet (180 mg total) by mouth daily. 10/28/17  Yes Roxan Hockey, MD  furosemide (LASIX) 20 MG tablet take 10-20 mg by mouth daily as needed for fluid--do not take while you are taking Bactrim antibiotic 10/28/17  Yes Roxan Hockey, MD  Glucosamine-Chondroit-Vit C-Mn (GLUCOSAMINE 1500 COMPLEX) CAPS Take one tablet once a day 01/12/13  Yes Bubba Camp, Mahima, MD  glucose blood (TRUETEST TEST) test strip Use to check blood sugar once daily Dx:E11.22 09/16/17  Yes Lauree Chandler, NP  lactobacillus (FLORANEX/LACTINEX) PACK Take 1 packet (1 g total) by mouth 3 (three) times daily with meals. 10/28/17  Yes Emokpae, Courage, MD  metFORMIN (GLUCOPHAGE) 500 MG tablet TAKE 1 TABLET BY MOUTH TWICE DAILY WITH A MEAL. 06/11/17  Yes Lauree Chandler, NP  metoprolol tartrate (LOPRESSOR) 25 MG tablet TAKE 1 TABLET BY MOUTH TWICE DAILY. 06/11/17  Yes Lauree Chandler, NP  Multiple Vitamin (MULITIVITAMIN WITH MINERALS) TABS Take 1 tablet by mouth daily.    Yes [provider]  Omega-3 Fatty Acids (FISH OIL) 1000 MG CAPS Take 1,000 mg by mouth daily.    Yes [provider]  ondansetron (ZOFRAN) 4 MG tablet Take 1 tablet (4 mg total) by mouth every 6 (six) hours as needed for nausea. 10/28/17  Yes Emokpae, Courage, MD  simvastatin (ZOCOR) 10 MG tablet TAKE ONE TABLET AT BEDTIME. Patient taking differently:  TAKE ONE TABLET (10 mg)  AT BEDTIME. 06/11/17  Yes Lauree Chandler, NP  sulfamethoxazole-trimethoprim (BACTRIM DS,SEPTRA DS) 800-160 MG tablet Take 1 tablet by mouth 2 (two) times daily for 6 days. 10/28/17 11/03/17 Yes Emokpae, Courage, MD  TRUEPLUS LANCETS 30G MISC Use to check blood sugar once daily. Dx E11.9 06/13/15  Yes Lauree Chandler, NP   Dg Chest 1 View  Result Date: 10/31/2017 CLINICAL DATA:  84 year old male with right leg pain after falling at home EXAM: CHEST  1 VIEW COMPARISON:  Prior chest x-ray 04/26/2015 FINDINGS: Stable borderline cardiomegaly. Atherosclerotic calcifications again noted in the transverse aorta. Mediastinal contours remain unchanged. Small volume of free fluid now present along the minor fissure. Developing nodular airspace opacity in the right suprahilar region. Otherwise, unchanged appearance of the lungs with chronic bronchitic changes and mild interstitial prominence. No pneumothorax or pleural effusion. No acute osseous abnormality. Surgical changes of prior right reverse shoulder arthroplasty. IMPRESSION: 1. Developing nodular opacity in the right suprahilar region may represent infiltrate, pulmonary nodule, or super hilar adenopathy. If clinically warranted, CT scan of the chest with intravenous contrast could further evaluate. 2. Small fluid tracking along the minor fissure. 3. Otherwise, no acute cardiopulmonary process. 4.  Aortic Atherosclerosis (ICD10-170.0) Electronically Signed   By: Jacqulynn Cadet M.D.   On: 10/31/2017 11:03   Ct Head Wo Contrast  Result Date: 10/31/2017 CLINICAL DATA:  Fall today.  Initial encounter. EXAM: CT HEAD WITHOUT CONTRAST TECHNIQUE: Contiguous axial images were obtained from the base of the skull through the vertex without intravenous contrast. COMPARISON:  04/26/2015 FINDINGS: Brain: There is no evidence of acute infarct, intracranial hemorrhage, mass, or midline shift. Cerebral atrophy is unchanged and not greater than  expected for age. Asymmetric extra-axial CSF overlying the left frontal lobe measures 8 mm in thickness, unchanged and compatible with a small subdural hygroma. Mildly prominent CSF along both sides of the falx near the vertex is also unchanged and may also represent small subdural hygromas or be related to atrophy. Cerebral white matter  hypodensities are similar to the prior study and nonspecific but compatible with chronic small vessel ischemic disease, mild for age. Heterogeneous hypoattenuation in the deep gray nuclei also likely reflects chronic small vessel ischemia/chronic lacunar infarcts. Vascular: Calcified atherosclerosis at the skull base. No hyperdense vessel. Skull: No fracture or suspicious osseous lesion. Sinuses/Orbits: Minimal scattered mucosal thickening in the paranasal sinuses. Clear mastoid air cells. Bilateral cataract extraction. Other: None. IMPRESSION: No evidence of acute intracranial abnormality. Electronically Signed   By: Logan Bores M.D.   On: 10/31/2017 13:39   Dg Hip Unilat  With Pelvis 2-3 Views Right  Result Date: 10/31/2017 CLINICAL DATA:  Right hip pain due to a fall today. Initial encounter. EXAM: DG HIP (WITH OR WITHOUT PELVIS) 2-3V RIGHT COMPARISON:  Plain films of the left hip and pelvis 04/26/2015. FINDINGS: The patient has an acute subcapital fracture of the right hip. No other acute abnormality is identified. Bones are osteopenic. Prominent stool ball in the rectum noted. IMPRESSION: Acute subcapital fracture right hip. Osteopenia. Electronically Signed   By: Inge Rise M.D.   On: 10/31/2017 11:01    Positive ROS: All other systems have been reviewed and were otherwise negative with the exception of those mentioned in the HPI and as above.  Physical Exam: General: Alert, no acute distress Cardiovascular: No pedal edema Respiratory: No cyanosis, no use of accessory musculature GI: No organomegaly, abdomen is soft and non-tender Skin: No lesions in the  area of chief complaint Neurologic: Sensation intact distally Psychiatric: Patient is competent for consent with normal mood and affect Lymphatic: No axillary or cervical lymphadenopathy  MUSCULOSKELETAL:  Left lower extremity demonstrates venous stasis and some erythema that seems to be inside the previously marked cellulitic outline.  There is a small healing punctate wound on the medial malleolus.  No purulence.  Neurovascularly intact.  He does have faint 1+ pulses.  Right lower extremity:  Externally rotated and shortened.  1.5 cm skin tear in the anterior distal pretibial region with some associated erythema related to that.  This is hemostatic.  He has motor intact distally and faint 1+ dorsalis pedis pulse.  He endorses sensation intact throughout the foot.  No pain with passive stretch.  Assessment: 1.  Right hip intracapsular fracture, closed.  Plan: -I discussed with the patient as well as his healthcare powers of attorney that he will need operative management of this hip fracture with an arthroplasty.  I like to get a CT scan however based on some of the preoperative plain films not being quite able to demonstrate where the neck fracture is.  We will review these preoperatively. -The patient has requested the services of my group and specifically Dr. Adriana Mccallum.  He will be operating at [facility] on Monday and therefore we would request spells admission to [facility] for this injury. -Due to the history of recent Eliquis use we would recommend waiting until Monday at the earliest for operative intervention. -Nonweightbearing to the right lower extremity with bedrest until surgery. -I appreciate the hospitalist service for their excellent care of this patient during this admission.  We will follow along.    Nicholes Stairs, MD Cell [phone]    10/31/2017 1:54 PM

## 2017-11-01 ENCOUNTER — Encounter (HOSPITAL_COMMUNITY): Payer: Self-pay | Admitting: *Deleted

## 2017-11-01 DIAGNOSIS — L03116 Cellulitis of left lower limb: Secondary | ICD-10-CM

## 2017-11-01 DIAGNOSIS — D508 Other iron deficiency anemias: Secondary | ICD-10-CM

## 2017-11-01 LAB — BASIC METABOLIC PANEL
Anion gap: 9 (ref 5–15)
BUN: 32 mg/dL — AB (ref 6–20)
CHLORIDE: 107 mmol/L (ref 101–111)
CO2: 20 mmol/L — ABNORMAL LOW (ref 22–32)
Calcium: 9.1 mg/dL (ref 8.9–10.3)
Creatinine, Ser: 0.9 mg/dL (ref 0.61–1.24)
Glucose, Bld: 196 mg/dL — ABNORMAL HIGH (ref 65–99)
POTASSIUM: 4.7 mmol/L (ref 3.5–5.1)
SODIUM: 136 mmol/L (ref 135–145)

## 2017-11-01 LAB — CBC
HEMATOCRIT: 29.6 % — AB (ref 39.0–52.0)
Hemoglobin: 9.1 g/dL — ABNORMAL LOW (ref 13.0–17.0)
MCH: 23.8 pg — ABNORMAL LOW (ref 26.0–34.0)
MCHC: 30.7 g/dL (ref 30.0–36.0)
MCV: 77.3 fL — AB (ref 78.0–100.0)
PLATELETS: 286 10*3/uL (ref 150–400)
RBC: 3.83 MIL/uL — AB (ref 4.22–5.81)
RDW: 18.2 % — AB (ref 11.5–15.5)
WBC: 11.2 10*3/uL — AB (ref 4.0–10.5)

## 2017-11-01 LAB — BLOOD GAS, ARTERIAL
Acid-base deficit: 5.3 mmol/L — ABNORMAL HIGH (ref 0.0–2.0)
Bicarbonate: 17.8 mmol/L — ABNORMAL LOW (ref 20.0–28.0)
DRAWN BY: 514251
O2 Content: 4 L/min
O2 Saturation: 97.5 %
PCO2 ART: 26.8 mmHg — AB (ref 32.0–48.0)
PH ART: 7.435 (ref 7.350–7.450)
Patient temperature: 97.4
pO2, Arterial: 95.9 mmHg (ref 83.0–108.0)

## 2017-11-01 LAB — GLUCOSE, CAPILLARY
GLUCOSE-CAPILLARY: 151 mg/dL — AB (ref 65–99)
Glucose-Capillary: 189 mg/dL — ABNORMAL HIGH (ref 65–99)
Glucose-Capillary: 111 mg/dL — ABNORMAL HIGH (ref 65–99)
Glucose-Capillary: 105 mg/dL — ABNORMAL HIGH (ref 65–99)

## 2017-11-01 LAB — MRSA PCR SCREENING: MRSA by PCR: NEGATIVE

## 2017-11-01 MED ORDER — CHLORHEXIDINE GLUCONATE 4 % EX LIQD
60.0000 mL | Freq: Once | CUTANEOUS | Status: DC
  Filled 2017-11-01: qty 60

## 2017-11-01 MED ORDER — CEFAZOLIN SODIUM-DEXTROSE 2-4 GM/100ML-% IV SOLN: 2.0000 g | INTRAVENOUS | Status: DC

## 2017-11-01 MED ORDER — SODIUM CHLORIDE 0.9 % IV SOLN
INTRAVENOUS | Status: DC
  Administered 2017-11-02: 07:00:00 via INTRAVENOUS

## 2017-11-01 MED ORDER — POVIDONE-IODINE 10 % EX SWAB: 2.0000 "application " | Freq: Once | CUTANEOUS | Status: DC

## 2017-11-01 NOTE — Progress Notes (Signed)
Subjective:   Procedure(s) (LRB): ARTHROPLASTY BIPOLAR HIP (HEMIARTHROPLASTY) (Right)  Patient reports pain as infrequent, but severe when it hits.  Resting comfortably in bed.  Reports that he doesn't have much of an appetite.  Objective:   VITALS:  Temp:  [97.3 F (36.3 C)-97.6 F (36.4 C)] 97.6 F (36.4 C) (04/21 0435) Pulse Rate:  [64-130] 115 (04/21 0435) Resp:  [14-27] 14 (04/21 0435) BP: (120-154)/(60-112) 141/112 (04/21 0435) SpO2:  [91 %-100 %] 99 % (04/20 2118) Weight:  [56.7 kg (125 lb)] 56.7 kg (125 lb) (04/20 8295)  General: WDWN patient in NAD. Psych:  Appropriate mood and affect. Neuro:  A&O x 3, Moving all extremities, sensation intact to light touch HEENT:  EOMs intact Chest:  Even non-labored respirations Skin:  Dressing over pretibial lacerating C/D/I, no rashes or lesions Extremities: warm/dry, no edema, erythema or echymosis.  No lymphadenopathy. Pulses: Popliteus 2+ MSK:  ROM: TKE, pain with any ROM of R hip/knee MMT: able to perform quad set, (-) Homan's    LABS Recent Labs    10/31/17 1007  HGB 9.2*  WBC 15.3*  PLT 305   Recent Labs    10/31/17 1007  NA 133*  K 5.0  CL 106  CO2 16*  BUN 41*  CREATININE 1.28*  GLUCOSE 227*   Recent Labs    10/31/17 1007  INR 1.33     Assessment/Plan:   Procedure(s) (LRB): ARTHROPLASTY BIPOLAR HIP (HEMIARTHROPLASTY) (Right)  Patient seen in rounds for Dr. Stann Mainland. Plan to OR tomorrow with Dr. Alvan Dame. CT scan obtained. NWB R LE NPO after midnight. Continue to hold blood thinners.  Mechele Claude PA-C [facility] Office:  [phone]

## 2017-11-01 NOTE — Progress Notes (Signed)
PROGRESS NOTE  Logan Bean GYF:[id number] DOB: 1921-02-17 DOA: 10/31/2017 PCP: Lauree Chandler, NP  HPI/Recap of past 24 hours: Logan Bean is a 84 y.o. male with medical history significant for HTN, PVD, venous stasis ulcers status post stenting on left SFA, A. Fib CHF presents to the ED c/o right hip pain after mechanical fall PTA on 10/31/17. Denies hitting his head or LOC. In the ED, xray showed R hip fracture. Of note, pt was just discharged from the hospital on 10/28/17 after being treated for left leg cellulitis.  Orthopedics consulted.  Pt admitted for further management.  Today, patient reported having severe right hip pain/spasms which comes as episodes, otherwise denies any chest pain, shortness of breath, abdominal pain, diarrhea, fever/chills.   Assessment/Plan: Principal Problem:   Hip fracture (HCC) Active Problems:   Dyslipidemia   GERD (gastroesophageal reflux disease)   CHF (congestive heart failure) (HCC)   Chronic atrial fibrillation (HCC)   Chronic anticoagulation-Eliquis   CKD (chronic kidney disease) stage 3, GFR 30-59 ml/min (HCC)   Fall   Type 2 diabetes mellitus with diabetic chronic kidney disease (HCC)   Iron deficiency anemia secondary to inadequate dietary iron intake   Cellulitis   Abnormal chest x-ray  Right hip fracture 2/2 mechanical fall Pain management Orthopedics on board, for surgery on 11/02/17 Eliquis held, n.p.o. at midnight PT/OT per Ortho  Leukocytosis Likely reactive Repeat CBC  Left lower extremity MRSA cellulitis/wound infection Improving Wound cultures grew MRSA Discharged home on Bactrim, to complete dose on 11/03/17 Monitor renal function closely, avoid nephrotoxic, if it worsens, will switch to IV Vanco Daily BMP  ??Pulmonary nodule/mass CT chest showed lobulated mass within the major fissure on the RIGHT measuring 8.3 x 2.1 x 4.7cm. Likely pseudotumor related to previous pleural effusion. Less likely, pleural lipoma  or loculated hematoma. Recommend continued radiographic or CT follow-up to document decreased size Same mass was noted on CXR in 2015 (1.7cm) PCP to follow up  AKI Creatinine 1.28 on admission, baseline WNL Continue gentle IV fluids Held home lisinopril, avoid nephrotoxics Strict I and O  Chronic A. Fib Rate controlled Chadscore 5 Continue metoprolol, hold Eliquis  Diabetes mellitus type 2 diabetes A1c in January 2019, 7.4 SSI, Accu-Cheks Hold home metformin  Hypertension Stable Continue metoprolol Lasix and lisinopril on hold until course of Bactrim completed  Microcytic anemia Baseline around 11 Likely due to iron deficiency anemia Continue iron supplements    Code Status: DNR  Family Communication: None at bedside  Disposition Plan: Once management complete, likely SNF   Consultants:  Orthopedics  Procedures:  None  Antimicrobials:  P.o. Bactrim  DVT prophylaxis: Held Eliquis for surgery   Objective: Vitals:   11/01/17 0126 11/01/17 0435 11/01/17 0919 11/01/17 1052  BP: 138/68 (!) 141/112 124/62 140/62  Pulse: 84 (!) 115 82 80  Resp: 16 14  15   Temp: (!) 97.5 F (36.4 C) 97.6 F (36.4 C)  97.6 F (36.4 C)  TempSrc: Oral Oral  Oral  SpO2:    (!) 87%  Weight:      Height:        Intake/Output Summary (Last 24 hours) at 11/01/2017 1150 Last data filed at 11/01/2017 1053 Gross per 24 hour  Intake 690 ml  Output 550 ml  Net 140 ml   Filed Weights   10/31/17 0922  Weight: 56.7 kg (125 lb)    Exam:   General: Mild distress due to pain  Cardiovascular: S1, S2, irregular  Respiratory: CTAB  Abdomen: Soft, nontender, nondistended, bowel sounds present  Musculoskeletal: Right hip TTP, chronic venous stasis noted on BLE, some ulcer noted on LLE  Skin: Chronic venous stasis as noted above  Psychiatry: Normal mood   Data Reviewed: CBC: Recent Labs  Lab 10/25/17 2245 10/26/17 0410 10/28/17 0318 10/31/17 1007  WBC 8.7  8.2 9.8 15.3*  NEUTROABS 6.9  --   --  13.7*  HGB 10.6* 9.4* 8.6* 9.2*  HCT 35.5* 31.2* 28.1* 30.7*  MCV 79.4 76.8* 77.6* 76.8*  PLT 275 218 213 329   Basic Metabolic Panel: Recent Labs  Lab 10/25/17 2245 10/26/17 0410 10/28/17 0318 10/31/17 1007  NA 139 136 137 133*  K 4.3 4.8 4.4 5.0  CL 106 107 108 106  CO2 23 18* 20* 16*  GLUCOSE 175* 161* 136* 227*  BUN 45* 40* 29* 41*  CREATININE 0.92 0.95 1.03 1.28*  CALCIUM 9.4 8.7* 8.6* 9.5   GFR: Estimated Creatinine Clearance (by C-G formula based on SCr of 1.28 mg/dL (H)) Male: 21.7 mL/min (A) Male: 27.7 mL/min (A) Liver Function Tests: Recent Labs  Lab 10/31/17 1007  AST 25  ALT 27  ALKPHOS 88  BILITOT 0.9  PROT 6.1*  ALBUMIN 3.0*   No results for input(s): LIPASE, AMYLASE in the last 168 hours. No results for input(s): AMMONIA in the last 168 hours. Coagulation Profile: Recent Labs  Lab 10/31/17 1007  INR 1.33   Cardiac Enzymes: No results for input(s): CKTOTAL, CKMB, CKMBINDEX, TROPONINI in the last 168 hours. BNP (last 3 results) No results for input(s): PROBNP in the last 8760 hours. HbA1C: No results for input(s): HGBA1C in the last 72 hours. CBG: Recent Labs  Lab 10/28/17 1150 10/28/17 1639 10/31/17 1652 10/31/17 2301 11/01/17 0806  GLUCAP 183* 138* 172* 169* 151*   Lipid Profile: No results for input(s): CHOL, HDL, LDLCALC, TRIG, CHOLHDL, LDLDIRECT in the last 72 hours. Thyroid Function Tests: No results for input(s): TSH, T4TOTAL, FREET4, T3FREE, THYROIDAB in the last 72 hours. Anemia Panel: No results for input(s): VITAMINB12, FOLATE, FERRITIN, TIBC, IRON, RETICCTPCT in the last 72 hours. Urine analysis:    Component Value Date/Time   COLORURINE YELLOW 04/26/2015 2110   APPEARANCEUR CLEAR 04/26/2015 2110   LABSPEC 1.018 04/26/2015 2110   PHURINE 6.0 04/26/2015 2110   GLUCOSEU 250 (A) 04/26/2015 2110   HGBUR TRACE (A) 04/26/2015 2110   BILIRUBINUR NEGATIVE 04/26/2015 2110   KETONESUR  NEGATIVE 04/26/2015 2110   PROTEINUR NEGATIVE 04/26/2015 2110   UROBILINOGEN 0.2 04/26/2015 2110   NITRITE NEGATIVE 04/26/2015 2110   LEUKOCYTESUR NEGATIVE 04/26/2015 2110   Sepsis Labs: @LABRCNTIP (procalcitonin:4,lacticidven:4)  ) Recent Results (from the past 240 hour(s))  Blood culture (routine x 2)     Status: None   Collection Time: 10/26/17  4:10 AM  Result Value Ref Range Status   Specimen Description   Final    BLOOD RIGHT FOREARM Performed at [facility], [place] [address], [place], [place] [address]    Special Requests   Final    BOTTLES DRAWN AEROBIC ONLY Blood Culture adequate volume Performed at [facility] [address], [place], [place] [address]    Culture   Final    NO GROWTH 5 DAYS Performed at [facility], [place] [address] [place], [place] [address]    Report Status 10/31/2017 FINAL  Final  Blood culture (routine x 2)     Status: None   Collection Time: 10/26/17  4:10 AM  Result Value  Ref Range Status   Specimen Description   Final    BLOOD RIGHT ANTECUBITAL Performed at [facility] [address], [place], [place] [address]    Special Requests   Final    BOTTLES DRAWN AEROBIC ONLY Blood Culture adequate volume Performed at [facility] [address], [place], [place] [address]    Culture   Final    NO GROWTH 5 DAYS Performed at [facility], [place] [address] [place], [place] [address]    Report Status 10/31/2017 FINAL  Final  Aerobic Culture (superficial specimen)     Status: None   Collection Time: 10/26/17  9:40 AM  Result Value Ref Range Status   Specimen Description   Final    WOUND LEFT LEG Performed at [facility] [address] [place], [place] [address]    Special Requests   Final    Normal Performed at [facility], [place] [address] [place], [place] [address]    Gram Stain   Final    NO WBC SEEN NO  ORGANISMS SEEN Performed at [facility], [place] [address], [place], [place] [address]    Culture   Final    MODERATE METHICILLIN RESISTANT STAPHYLOCOCCUS AUREUS   Report Status 10/28/2017 FINAL  Final   Organism ID, Bacteria METHICILLIN RESISTANT STAPHYLOCOCCUS AUREUS  Final      Susceptibility   Methicillin resistant staphylococcus aureus - MIC*    CIPROFLOXACIN >=8 RESISTANT Resistant     ERYTHROMYCIN >=8 RESISTANT Resistant     GENTAMICIN <=0.5 SENSITIVE Sensitive     OXACILLIN >=4 RESISTANT Resistant     TETRACYCLINE >=16 RESISTANT Resistant     VANCOMYCIN 1 SENSITIVE Sensitive     TRIMETH/SULFA <=10 SENSITIVE Sensitive     CLINDAMYCIN <=0.25 SENSITIVE Sensitive     RIFAMPIN <=0.5 SENSITIVE Sensitive     Inducible Clindamycin NEGATIVE Sensitive     * MODERATE METHICILLIN RESISTANT STAPHYLOCOCCUS AUREUS      Studies: Ct Head Wo Contrast  Result Date: 10/31/2017 CLINICAL DATA:  Fall today.  Initial encounter. EXAM: CT HEAD WITHOUT CONTRAST TECHNIQUE: Contiguous axial images were obtained from the base of the skull through the vertex without intravenous contrast. COMPARISON:  04/26/2015 FINDINGS: Brain: There is no evidence of acute infarct, intracranial hemorrhage, mass, or midline shift. Cerebral atrophy is unchanged and not greater than expected for age. Asymmetric extra-axial CSF overlying the left frontal lobe measures 8 mm in thickness, unchanged and compatible with a small subdural hygroma. Mildly prominent CSF along both sides of the falx near the vertex is also unchanged and may also represent small subdural hygromas or be related to atrophy. Cerebral white matter hypodensities are similar to the prior study and nonspecific but compatible with chronic small vessel ischemic disease, mild for age. Heterogeneous hypoattenuation in the deep gray nuclei also likely reflects chronic small vessel ischemia/chronic lacunar infarcts. Vascular: Calcified atherosclerosis at the skull  base. No hyperdense vessel. Skull: No fracture or suspicious osseous lesion. Sinuses/Orbits: Minimal scattered mucosal thickening in the paranasal sinuses. Clear mastoid air cells. Bilateral cataract extraction. Other: None. IMPRESSION: No evidence of acute intracranial abnormality. Electronically Signed   By: Logan Bores M.D.   On: 10/31/2017 13:39   Ct Chest Wo Contrast  Result Date: 10/31/2017 CLINICAL DATA:  Pt got up to go to bathroom at home this morning and just got to weak and fell, denies hitting his head, has rt leg pain EXAM: CT  CHEST WITHOUT CONTRAST TECHNIQUE: Multidetector CT imaging of the chest was performed following the standard protocol without IV contrast. COMPARISON:  Chest x-ray 10/31/2017 FINDINGS: Cardiovascular: The heart is enlarged. There is dense calcification of the mitral annulus and coronary arteries. There is atherosclerotic calcification of the thoracic aorta not associated with aneurysm. Mediastinum/Nodes: The RIGHT lobe of the thyroid is enlarged and slightly heterogeneous without discrete mass. There numerous enlarged mediastinal lymph nodes. Precarinal lymph node is 12 millimeters in short axis. Enlarged prevascular lymph nodes are present, largest measuring 11 millimeters in short axis. Enlarged subcarinal lymph node is 2.2 centimeters. Assessment of the hilar lymph nodes is more limited due to lack of intravenous contrast. The esophagus is normal in appearance. Lungs/Pleura: There is pan lobular emphysema, most marked at the apices. A low-attenuation lobulated mass is identified within the RIGHT major fissure measuring 2.1 x 8.3 x 4.7 centimeters. By density measurements of -8 Hounsfield units, mass likely contains water and/or fat. There is mild septal wall thickening, consistent with edema. No suspicious pulmonary nodules. No consolidations. Trace bilateral pleural effusions. No pneumothorax. There are bilateral subpleural reticular changes. Upper Abdomen: Contour of the  liver appears nodular raising the question of cirrhosis. LEFT adrenal nodule is 3.3 x 3.8 centimeters, and measures low density, consistent with benign adenoma. RIGHT adrenal gland is normal in appearance. Musculoskeletal: Remote rib fractures. No evidence for acute RIGHT shoulder arthroplasty. Abnormality. IMPRESSION: 1. Lobulated mass within the major fissure on the RIGHT measuring 8.3 x 2.1 x 4.7 centimeters. Findings are favored to represent a pseudotumor related to previous pleural effusion. Less likely, this could represent a pleural lipoma or loculated hematoma. Recommend continued radiographic or CT follow-up to document decreased size. 2. Enlarged mediastinal lymph nodes warrant follow-up. Follow-up chest CT is recommended in 6 months. Intravenous contrast is recommended unless contraindicated for optimal evaluation of the hilar regions. 3. Cardiomegaly.  Coronary artery calcification. 4. Mild septal thickening consistent with mild edema. 5.  Aortic atherosclerosis.  (ICD10-I70.0) 6.  Emphysema (ICD10-J43.9) 7. Question of hepatic cirrhosis. 8. LEFT adrenal adenoma. 9. Remote rib fractures.  No acute injury.  No pneumothorax. Electronically Signed   By: Nolon Nations M.D.   On: 10/31/2017 14:29   Ct Hip Right Wo Contrast  Result Date: 10/31/2017 CLINICAL DATA:  Patient status post fall with a right hip fracture today. Preoperative imaging. Initial encounter. EXAM: CT OF THE RIGHT HIP WITHOUT CONTRAST TECHNIQUE: Multidetector CT imaging of the right hip was performed according to the standard protocol. Multiplanar CT image reconstructions were also generated. COMPARISON:  Plain films right hip this same day. FINDINGS: Bones/Joint/Cartilage There is a subcapital fracture of the right hip. The femoral neck directed anteriorly due to posterior rotation of the greater trochanter. There is approximately 1/2 shaft width lateral/anterior displacement of the femoral neck. No intertrochanteric involvement is  identified. No other fracture is seen. Degenerative disc disease lower lumbar spine noted. No lytic or sclerotic lesion is identified. Ligaments Suboptimally assessed by CT. Muscles and Tendons There is fatty atrophy of the gluteus minimus. Soft tissues Subcutaneous soft tissue contusion over the hip noted. Atherosclerotic vascular disease identified. IMPRESSION: Subcapital fracture right hip. The femoral neck fracture is directed anteriorly due to posterior rotation of the greater trochanter. Electronically Signed   By: Inge Rise M.D.   On: 10/31/2017 15:02    Scheduled Meds: . calcium-vitamin D  1 tablet Oral BID WC  . chlorhexidine  60 mL Topical Once  . docusate sodium  100 mg  Oral BID  . [START ON 11/02/2017] ferrous sulfate  325 mg Oral Once per day on Mon Thu  . heparin  5,000 Units Subcutaneous Q8H  . insulin aspart  0-15 Units Subcutaneous TID WC  . insulin aspart  0-5 Units Subcutaneous QHS  . lactobacillus  1 g Oral TID WC  . loratadine  10 mg Oral Daily  . metoprolol tartrate  25 mg Oral BID  . povidone-iodine  2 application Topical Once  . simvastatin  10 mg Oral QHS  . sulfamethoxazole-trimethoprim  1 tablet Oral BID    Continuous Infusions: . sodium chloride 50 mL/hr (11/01/17 0938)  . sodium chloride    . methocarbamol (ROBAXIN)  IV       LOS: 1 day     Alma Friendly, MD [facility]   If 7PM-7AM, please contact night-coverage [email or web address] Password [facility] 11/01/2017, 11:50 AM

## 2017-11-02 ENCOUNTER — Inpatient Hospital Stay (HOSPITAL_COMMUNITY): Payer: Medicare Other | Admitting: Anesthesiology

## 2017-11-02 ENCOUNTER — Encounter (HOSPITAL_COMMUNITY)
Admission: EM | Disposition: A | Discharge status: Hospice | Payer: Self-pay | Source: Home / Self Care | Attending: Internal Medicine

## 2017-11-02 ENCOUNTER — Encounter (HOSPITAL_COMMUNITY): Payer: Self-pay | Admitting: General Practice

## 2017-11-02 ENCOUNTER — Inpatient Hospital Stay (HOSPITAL_COMMUNITY): Payer: Medicare Other

## 2017-11-02 HISTORY — PX: HIP ARTHROPLASTY: SHX981

## 2017-11-02 LAB — BASIC METABOLIC PANEL
ANION GAP: 10 (ref 5–15)
BUN: 30 mg/dL — ABNORMAL HIGH (ref 6–20)
CHLORIDE: 108 mmol/L (ref 101–111)
CO2: 18 mmol/L — AB (ref 22–32)
CREATININE: 0.79 mg/dL (ref 0.61–1.24)
Calcium: 8.9 mg/dL (ref 8.9–10.3)
GFR calc non Af Amer: >60 mL/min (ref >60)
Glucose, Bld: 168 mg/dL — ABNORMAL HIGH (ref 65–99)
POTASSIUM: 4.8 mmol/L (ref 3.5–5.1)
SODIUM: 136 mmol/L (ref 135–145)

## 2017-11-02 LAB — CBC WITH DIFFERENTIAL/PLATELET
BASOS ABS: 0.1 10*3/uL (ref 0.0–0.1)
BASOS PCT: 1 %
EOS ABS: 0.3 10*3/uL (ref 0.0–0.7)
Eosinophils Relative: 3 %
HEMATOCRIT: 28.6 % — AB (ref 39.0–52.0)
HEMOGLOBIN: 8.8 g/dL — AB (ref 13.0–17.0)
Lymphocytes Relative: 7 %
Lymphs Abs: 0.7 10*3/uL (ref 0.7–4.0)
MCH: 23.6 pg — ABNORMAL LOW (ref 26.0–34.0)
MCHC: 30.8 g/dL (ref 30.0–36.0)
MCV: 76.7 fL — ABNORMAL LOW (ref 78.0–100.0)
Monocytes Absolute: 0.7 10*3/uL (ref 0.1–1.0)
Monocytes Relative: 7 %
NEUTROS ABS: 8.4 10*3/uL — AB (ref 1.7–7.7)
NEUTROS PCT: 82 %
Platelets: 296 10*3/uL (ref 150–400)
RBC: 3.73 MIL/uL — AB (ref 4.22–5.81)
RDW: 18.1 % — ABNORMAL HIGH (ref 11.5–15.5)
WBC: 10.1 10*3/uL (ref 4.0–10.5)

## 2017-11-02 LAB — ABO/RH: ABO/RH(D): O POS

## 2017-11-02 LAB — GLUCOSE, CAPILLARY
GLUCOSE-CAPILLARY: 154 mg/dL — AB (ref 65–99)
GLUCOSE-CAPILLARY: 220 mg/dL — AB (ref 65–99)
Glucose-Capillary: 111 mg/dL — ABNORMAL HIGH (ref 65–99)
Glucose-Capillary: 210 mg/dL — ABNORMAL HIGH (ref 65–99)

## 2017-11-02 LAB — IRON AND TIBC
IRON: 13 ug/dL — AB (ref 45–182)
Saturation Ratios: 4 % — ABNORMAL LOW (ref 17.9–39.5)
TIBC: 353 ug/dL (ref 250–450)
UIBC: 340 ug/dL

## 2017-11-02 LAB — FERRITIN: Ferritin: 41 ng/mL (ref 24–336)

## 2017-11-02 LAB — PREPARE RBC (CROSSMATCH)

## 2017-11-02 SURGERY — HEMIARTHROPLASTY, HIP, DIRECT ANTERIOR APPROACH, FOR FRACTURE
Anesthesia: General | Site: Hip | Laterality: Right

## 2017-11-02 MED ORDER — FENTANYL CITRATE (PF) 100 MCG/2ML IJ SOLN
INTRAMUSCULAR | Status: AC
  Filled 2017-11-02: qty 2

## 2017-11-02 MED ORDER — LACTATED RINGERS IV SOLN: Freq: Once | INTRAVENOUS | Status: DC

## 2017-11-02 MED ORDER — BOOST PLUS PO LIQD
237.0000 mL | Freq: Two times a day (BID) | ORAL | Status: DC
  Filled 2017-11-02 (×4): qty 237

## 2017-11-02 MED ORDER — POLYETHYLENE GLYCOL 3350 17 G PO PACK: 17.0000 g | PACK | Freq: Every day | ORAL | Status: DC | PRN

## 2017-11-02 MED ORDER — ONDANSETRON HCL 4 MG/2ML IJ SOLN
INTRAMUSCULAR | Status: AC
  Filled 2017-11-02: qty 2

## 2017-11-02 MED ORDER — SUGAMMADEX SODIUM 200 MG/2ML IV SOLN
INTRAVENOUS | Status: DC | PRN
  Administered 2017-11-02: 100 mg via INTRAVENOUS

## 2017-11-02 MED ORDER — SODIUM CHLORIDE 0.9 % IV SOLN
10.0000 mL/h | Freq: Once | INTRAVENOUS | Status: AC
  Administered 2017-11-02: 10 mL/h via INTRAVENOUS

## 2017-11-02 MED ORDER — DEXAMETHASONE SODIUM PHOSPHATE 10 MG/ML IJ SOLN
INTRAMUSCULAR | Status: AC
  Filled 2017-11-02: qty 1

## 2017-11-02 MED ORDER — FENTANYL CITRATE (PF) 100 MCG/2ML IJ SOLN
INTRAMUSCULAR | Status: DC | PRN
  Administered 2017-11-02: 25 ug via INTRAVENOUS
  Administered 2017-11-02 (×2): 50 ug via INTRAVENOUS

## 2017-11-02 MED ORDER — DOCUSATE SODIUM 100 MG PO CAPS: 100.0000 mg | ORAL_CAPSULE | Freq: Two times a day (BID) | ORAL | 0 refills | Status: DC

## 2017-11-02 MED ORDER — FENTANYL CITRATE (PF) 100 MCG/2ML IJ SOLN
INTRAMUSCULAR | Status: AC
Start: 2017-11-02 — End: 2017-11-02
  Filled 2017-11-02: qty 2

## 2017-11-02 MED ORDER — ONDANSETRON HCL 4 MG PO TABS: 4.0000 mg | ORAL_TABLET | Freq: Four times a day (QID) | ORAL | Status: DC | PRN

## 2017-11-02 MED ORDER — DILTIAZEM HCL-DEXTROSE 100-5 MG/100ML-% IV SOLN (PREMIX)
8.0000 mg/h | INTRAVENOUS | Status: DC
  Administered 2017-11-03: 10 mg/h via INTRAVENOUS
  Administered 2017-11-04 – 2017-11-08 (×6): 5 mg/h via INTRAVENOUS
  Filled 2017-11-02 (×9): qty 100

## 2017-11-02 MED ORDER — LIP MEDEX EX OINT
TOPICAL_OINTMENT | CUTANEOUS | Status: AC
  Administered 2017-11-02: 1
  Filled 2017-11-02: qty 7

## 2017-11-02 MED ORDER — FENTANYL CITRATE (PF) 100 MCG/2ML IJ SOLN
50.0000 ug | INTRAMUSCULAR | Status: DC
  Administered 2017-11-02: 25 ug via INTRAVENOUS

## 2017-11-02 MED ORDER — DOCUSATE SODIUM 100 MG PO CAPS: 100.0000 mg | ORAL_CAPSULE | Freq: Two times a day (BID) | ORAL | Status: DC

## 2017-11-02 MED ORDER — VANCOMYCIN HCL 1000 MG IV SOLR
INTRAVENOUS | Status: DC | PRN
  Administered 2017-11-02: 1000 mg via INTRAVENOUS

## 2017-11-02 MED ORDER — ROCURONIUM BROMIDE 10 MG/ML (PF) SYRINGE
PREFILLED_SYRINGE | INTRAVENOUS | Status: AC
  Filled 2017-11-02: qty 5

## 2017-11-02 MED ORDER — SODIUM CHLORIDE 0.9 % IV SOLN
INTRAVENOUS | Status: DC | PRN
  Administered 2017-11-02: 18:00:00 via INTRAVENOUS

## 2017-11-02 MED ORDER — ROCURONIUM BROMIDE 10 MG/ML (PF) SYRINGE
PREFILLED_SYRINGE | INTRAVENOUS | Status: DC | PRN
  Administered 2017-11-02: 35 mg via INTRAVENOUS

## 2017-11-02 MED ORDER — APIXABAN 2.5 MG PO TABS
2.5000 mg | ORAL_TABLET | Freq: Two times a day (BID) | ORAL | Status: DC
  Filled 2017-11-02: qty 1

## 2017-11-02 MED ORDER — INSULIN ASPART 100 UNIT/ML ~~LOC~~ SOLN
SUBCUTANEOUS | Status: AC
  Administered 2017-11-02: 2 [IU] via SUBCUTANEOUS
  Filled 2017-11-02: qty 1

## 2017-11-02 MED ORDER — LACTATED RINGERS IV SOLN
INTRAVENOUS | Status: DC | PRN
  Administered 2017-11-02: 17:00:00 via INTRAVENOUS
  Administered 2017-11-02: 1000 mL

## 2017-11-02 MED ORDER — CEFAZOLIN SODIUM-DEXTROSE 2-4 GM/100ML-% IV SOLN
2.0000 g | Freq: Four times a day (QID) | INTRAVENOUS | Status: AC
  Administered 2017-11-03 (×2): 2 g via INTRAVENOUS
  Filled 2017-11-02 (×2): qty 100

## 2017-11-02 MED ORDER — DILTIAZEM HCL 25 MG/5ML IV SOLN
INTRAVENOUS | Status: AC
  Filled 2017-11-02: qty 5

## 2017-11-02 MED ORDER — CEFAZOLIN SODIUM-DEXTROSE 2-4 GM/100ML-% IV SOLN
INTRAVENOUS | Status: AC
  Filled 2017-11-02: qty 100

## 2017-11-02 MED ORDER — ONDANSETRON HCL 4 MG/2ML IJ SOLN: 4.0000 mg | Freq: Four times a day (QID) | INTRAMUSCULAR | Status: DC | PRN

## 2017-11-02 MED ORDER — CEFAZOLIN SODIUM-DEXTROSE 2-3 GM-%(50ML) IV SOLR
INTRAVENOUS | Status: DC | PRN
  Administered 2017-11-02 (×2): 2 g via INTRAVENOUS

## 2017-11-02 MED ORDER — FENTANYL CITRATE (PF) 100 MCG/2ML IJ SOLN
INTRAMUSCULAR | Status: AC
  Administered 2017-11-02: 25 ug via INTRAVENOUS
  Filled 2017-11-02: qty 2

## 2017-11-02 MED ORDER — DILTIAZEM HCL 100 MG IV SOLR
5.0000 mg/h | INTRAVENOUS | Status: DC
  Filled 2017-11-02: qty 100

## 2017-11-02 MED ORDER — FENTANYL CITRATE (PF) 100 MCG/2ML IJ SOLN
25.0000 ug | INTRAMUSCULAR | Status: DC | PRN
  Administered 2017-11-02: 25 ug via INTRAVENOUS

## 2017-11-02 MED ORDER — DILTIAZEM HCL 100 MG IV SOLR
INTRAVENOUS | Status: DC | PRN
  Administered 2017-11-02: 5 mg/h via INTRAVENOUS

## 2017-11-02 MED ORDER — VANCOMYCIN HCL IN DEXTROSE 1-5 GM/200ML-% IV SOLN
INTRAVENOUS | Status: AC
  Filled 2017-11-02: qty 200

## 2017-11-02 MED ORDER — PHENOL 1.4 % MT LIQD: 1.0000 | OROMUCOSAL | Status: DC | PRN

## 2017-11-02 MED ORDER — ADULT MULTIVITAMIN W/MINERALS CH: 1.0000 | ORAL_TABLET | Freq: Every day | ORAL | Status: DC

## 2017-11-02 MED ORDER — METHOCARBAMOL 500 MG PO TABS: 500.0000 mg | ORAL_TABLET | Freq: Four times a day (QID) | ORAL | 0 refills | Status: AC | PRN

## 2017-11-02 MED ORDER — MAGNESIUM CITRATE PO SOLN: 1.0000 | Freq: Once | ORAL | Status: DC | PRN

## 2017-11-02 MED ORDER — FERROUS SULFATE 325 (65 FE) MG PO TABS
325.0000 mg | ORAL_TABLET | Freq: Three times a day (TID) | ORAL | Status: DC
  Filled 2017-11-02: qty 1

## 2017-11-02 MED ORDER — 0.9 % SODIUM CHLORIDE (POUR BTL) OPTIME
TOPICAL | Status: DC | PRN
  Administered 2017-11-02: 1000 mL

## 2017-11-02 MED ORDER — HYDROCODONE-ACETAMINOPHEN 5-325 MG PO TABS: 1.0000 | ORAL_TABLET | ORAL | 0 refills | Status: AC | PRN

## 2017-11-02 MED ORDER — FENTANYL CITRATE (PF) 250 MCG/5ML IJ SOLN
INTRAMUSCULAR | Status: AC
  Filled 2017-11-02: qty 5

## 2017-11-02 MED ORDER — INSULIN ASPART 100 UNIT/ML ~~LOC~~ SOLN
2.0000 [IU] | Freq: Once | SUBCUTANEOUS | Status: AC
  Administered 2017-11-02: 2 [IU] via SUBCUTANEOUS

## 2017-11-02 MED ORDER — BISACODYL 10 MG RE SUPP
10.0000 mg | Freq: Every day | RECTAL | Status: DC | PRN
  Administered 2017-11-05: 10 mg via RECTAL
  Filled 2017-11-02: qty 1

## 2017-11-02 MED ORDER — FERROUS SULFATE 325 (65 FE) MG PO TABS: 325.0000 mg | ORAL_TABLET | Freq: Three times a day (TID) | ORAL | 3 refills | Status: DC

## 2017-11-02 MED ORDER — LACTATED RINGERS IV SOLN
INTRAVENOUS | Status: DC | PRN
  Administered 2017-11-02: 18:00:00 via INTRAVENOUS

## 2017-11-02 MED ORDER — POLYETHYLENE GLYCOL 3350 17 G PO PACK: 17.0000 g | PACK | Freq: Two times a day (BID) | ORAL | 0 refills | Status: AC

## 2017-11-02 MED ORDER — MENTHOL 3 MG MT LOZG
1.0000 | LOZENGE | OROMUCOSAL | Status: DC | PRN
Start: 2017-11-02 — End: 2017-11-08

## 2017-11-02 MED ORDER — DEXTROSE 5 % IV SOLN
INTRAVENOUS | Status: DC | PRN
  Administered 2017-11-02: 50 ug/min via INTRAVENOUS

## 2017-11-02 MED ORDER — PROPOFOL 10 MG/ML IV BOLUS
INTRAVENOUS | Status: AC
  Filled 2017-11-02: qty 20

## 2017-11-02 MED ORDER — METOCLOPRAMIDE HCL 5 MG/ML IJ SOLN: 5.0000 mg | Freq: Three times a day (TID) | INTRAMUSCULAR | Status: DC | PRN

## 2017-11-02 MED ORDER — LIDOCAINE 2% (20 MG/ML) 5 ML SYRINGE
INTRAMUSCULAR | Status: AC
  Filled 2017-11-02: qty 5

## 2017-11-02 MED ORDER — METOCLOPRAMIDE HCL 5 MG PO TABS: 5.0000 mg | ORAL_TABLET | Freq: Three times a day (TID) | ORAL | Status: DC | PRN

## 2017-11-02 SURGICAL SUPPLY — 56 items
BAG ZIPLOCK 12X15 (MISCELLANEOUS) ×3 IMPLANT
BLADE SAW SGTL 11.0X1.19X90.0M (BLADE) IMPLANT
BLADE SAW SGTL 18X1.27X75 (BLADE) ×2 IMPLANT
BLADE SAW SGTL 18X1.27X75MM (BLADE) ×1
CAPT HIP HEMI 2 ×3 IMPLANT
CLOSURE WOUND 1/2 X4 (GAUZE/BANDAGES/DRESSINGS) ×2
COVER SURGICAL LIGHT HANDLE (MISCELLANEOUS) ×3 IMPLANT
DERMABOND ADVANCED (GAUZE/BANDAGES/DRESSINGS) ×2
DERMABOND ADVANCED .7 DNX12 (GAUZE/BANDAGES/DRESSINGS) ×1 IMPLANT
DRAPE ORTHO SPLIT 77X108 STRL (DRAPES) ×4
DRAPE POUCH INSTRU U-SHP 10X18 (DRAPES) ×3 IMPLANT
DRAPE SURG 17X11 SM STRL (DRAPES) ×3 IMPLANT
DRAPE SURG ORHT 6 SPLT 77X108 (DRAPES) ×2 IMPLANT
DRAPE U-SHAPE 47X51 STRL (DRAPES) ×3 IMPLANT
DRESSING AQUACEL AG SP 3.5X10 (GAUZE/BANDAGES/DRESSINGS) ×1 IMPLANT
DRSG AQUACEL AG ADV 3.5X10 (GAUZE/BANDAGES/DRESSINGS) ×3 IMPLANT
DRSG AQUACEL AG SP 3.5X10 (GAUZE/BANDAGES/DRESSINGS) ×3
DRSG TEGADERM 4X4.75 (GAUZE/BANDAGES/DRESSINGS) ×3 IMPLANT
DURAPREP 26ML APPLICATOR (WOUND CARE) ×3 IMPLANT
ELECT BLADE TIP CTD 4 INCH (ELECTRODE) ×3 IMPLANT
ELECT REM PT RETURN 15FT ADLT (MISCELLANEOUS) ×3 IMPLANT
EVACUATOR 1/8 PVC DRAIN (DRAIN) ×3 IMPLANT
FACESHIELD WRAPAROUND (MASK) ×12 IMPLANT
GAUZE SPONGE 2X2 8PLY STRL LF (GAUZE/BANDAGES/DRESSINGS) ×1 IMPLANT
GLOVE BIOGEL M 7.0 STRL (GLOVE) IMPLANT
GLOVE BIOGEL PI IND STRL 7.5 (GLOVE) ×1 IMPLANT
GLOVE BIOGEL PI IND STRL 8.5 (GLOVE) ×1 IMPLANT
GLOVE BIOGEL PI IND STRL 9 (GLOVE) ×1 IMPLANT
GLOVE BIOGEL PI INDICATOR 7.5 (GLOVE) ×2
GLOVE BIOGEL PI INDICATOR 8.5 (GLOVE) ×2
GLOVE BIOGEL PI INDICATOR 9 (GLOVE) ×2
GLOVE ECLIPSE 8.5 STRL (GLOVE) ×3 IMPLANT
GLOVE INDICATOR 7.5 STRL GRN (GLOVE) ×6 IMPLANT
GLOVE ORTHO TXT STRL SZ7.5 (GLOVE) ×6 IMPLANT
GOWN STRL REUS W/TWL 2XL LVL3 (GOWN DISPOSABLE) ×3 IMPLANT
GOWN STRL REUS W/TWL LRG LVL3 (GOWN DISPOSABLE) ×3 IMPLANT
GOWN STRL REUS W/TWL XL LVL3 (GOWN DISPOSABLE) ×9 IMPLANT
HANDPIECE INTERPULSE COAX TIP (DISPOSABLE)
IMMOBILIZER KNEE 20 (SOFTGOODS)
IMMOBILIZER KNEE 20 THIGH 36 (SOFTGOODS) IMPLANT
KIT BASIN OR (CUSTOM PROCEDURE TRAY) ×3 IMPLANT
MANIFOLD NEPTUNE II (INSTRUMENTS) ×3 IMPLANT
PACK TOTAL JOINT (CUSTOM PROCEDURE TRAY) ×3 IMPLANT
POSITIONER SURGICAL ARM (MISCELLANEOUS) ×3 IMPLANT
SET HNDPC FAN SPRY TIP SCT (DISPOSABLE) IMPLANT
SPONGE GAUZE 2X2 STER 10/PKG (GAUZE/BANDAGES/DRESSINGS) ×2
STRIP CLOSURE SKIN 1/2X4 (GAUZE/BANDAGES/DRESSINGS) ×4 IMPLANT
SUT ETHIBOND NAB CT1 #1 30IN (SUTURE) ×3 IMPLANT
SUT MNCRL AB 4-0 PS2 18 (SUTURE) ×3 IMPLANT
SUT VIC AB 1 CT1 36 (SUTURE) ×6 IMPLANT
SUT VIC AB 2-0 CT1 27 (SUTURE) ×4
SUT VIC AB 2-0 CT1 TAPERPNT 27 (SUTURE) ×2 IMPLANT
SUT VLOC 180 0 24IN GS25 (SUTURE) ×3 IMPLANT
TOWEL OR 17X26 10 PK STRL BLUE (TOWEL DISPOSABLE) ×6 IMPLANT
TRAY FOLEY W/METER SILVER 16FR (SET/KITS/TRAYS/PACK) ×3 IMPLANT
WATER STERILE IRR 500ML POUR (IV SOLUTION) ×6 IMPLANT

## 2017-11-02 NOTE — Anesthesia Preprocedure Evaluation (Addendum)
Anesthesia Evaluation  Patient identified by MRN, date of birth, ID band Patient awake    Reviewed: Allergy & Precautions, H&P , NPO status , Patient's Chart, lab work & pertinent test results, reviewed documented beta blocker date and time   Airway Mallampati: I  TM Distance: >3 FB Neck ROM: Full    Dental  (+) Teeth Intact, Poor Dentition,    Pulmonary shortness of breath, former smoker,    breath sounds clear to auscultation       Cardiovascular hypertension, Pt. on medications and Pt. on home beta blockers + Peripheral Vascular Disease and +CHF  + dysrhythmias Atrial Fibrillation  Rhythm:Irregular Rate:Tachycardia     Neuro/Psych PSYCHIATRIC DISORDERS Anxiety Depression    GI/Hepatic hiatal hernia, PUD, GERD  Controlled and Medicated,  Endo/Other  diabetes, Type 2, Oral Hypoglycemic AgentsHyperthyroidism   Renal/GU Renal disease     Musculoskeletal  (+) Arthritis , Osteoarthritis,    Abdominal Normal abdominal exam  (+)   Peds  Hematology  (+) anemia ,   Anesthesia Other Findings   Reproductive/Obstetrics                           Anesthesia Physical  Anesthesia Plan  ASA: III  Anesthesia Plan: General   Post-op Pain Management:    Induction: Intravenous  PONV Risk Score and Plan: 3 and Ondansetron, Dexamethasone and Treatment may vary due to age or medical condition  Airway Management Planned: Oral ETT  Additional Equipment: CVP and Ultrasound Guidance Line Placement  Intra-op Plan:   Post-operative Plan: Extubation in OR  Informed Consent: I have reviewed the patients History and Physical, chart, labs and discussed the procedure including the risks, benefits and alternatives for the proposed anesthesia with the patient or authorized representative who has indicated his/her understanding and acceptance.   Dental advisory given  Plan Discussed with: CRNA, Anesthesiologist  and Surgeon  Anesthesia Plan Comments:        Anesthesia Quick Evaluation

## 2017-11-02 NOTE — Progress Notes (Signed)
Assisted Dr. Roderic Palau with central line, central line placed in left side of neck per Dr. Roderic Palau .Patient tolerated procedure well, no sedation given.

## 2017-11-02 NOTE — Progress Notes (Signed)
Patient has 3-4 quarter-ize ulcerated areas to left  ankle, lower left leg, dry and non-draining at present but sensitive to linens touching.  Areas covered with dry telfa, abdominal pad formed into a bootie for the heel and ankle area.  And secured with kerlix wrap.  Dr. Alvan Dame notified of sdressing applied and will consult with wound therapy postop.

## 2017-11-02 NOTE — Progress Notes (Signed)
Patient ID: Logan Bean, adult   DOB: 07/01/1921, 84 y.o.   MRN: [id number] Subjective: Hurting this am waiting for his pain meds    Patient reports pain as moderate.  Objective:   VITALS:   Vitals:   11/02/17 0136 11/02/17 0505  BP: (!) 139/91 126/71  Pulse: 92 (!) 115  Resp:  14  Temp: 98.2 F (36.8 C) 98.2 F (36.8 C)  SpO2:  100%    Neurovascular intact  RLE shortened externally rotated, pain with movement  LABS Recent Labs    10/31/17 1007 11/01/17 1329 11/02/17 0414  HGB 9.2* 9.1* 8.8*  HCT 30.7* 29.6* 28.6*  WBC 15.3* 11.2* 10.1  PLT 305 286 296    Recent Labs    10/31/17 1007 11/01/17 1329 11/02/17 0414  NA 133* 136 136  K 5.0 4.7 4.8  BUN 41* 32* 30*  CREATININE 1.28* 0.90 0.79  GLUCOSE 227* 196* 168*    Recent Labs    10/31/17 1007  INR 1.33     Assessment/Plan: Right displaced femoral neck fracture  Plan: To OR today for right hip hemiarthroplasty NPO Consent ordered Hgb 8.8 I do not anticipate significant blood loss but he may very well require peri-op transfusion for acute on chronic anemia

## 2017-11-02 NOTE — Anesthesia Postprocedure Evaluation (Signed)
Anesthesia Post Note  Patient: Logan Bean  Procedure(s) Performed: ARTHROPLASTY BIPOLAR HIP (HEMIARTHROPLASTY) (Right Hip)     Patient location during evaluation: PACU Anesthesia Type: General Level of consciousness: awake and alert Pain management: pain level controlled Vital Signs Assessment: post-procedure vital signs reviewed and stable Respiratory status: spontaneous breathing, nonlabored ventilation, respiratory function stable and patient connected to nasal cannula oxygen Cardiovascular status: blood pressure returned to baseline and stable Postop Assessment: no apparent nausea or vomiting Anesthetic complications: no    Last Vitals:  Vitals:   11/02/17 2115 11/02/17 2130  BP: (!) 160/65 (!) 149/95  Pulse: 89 (!) 119  Resp:    Temp:  (!) 36.4 C  SpO2: 95% 94%    Last Pain:  Vitals:   11/02/17 2200  TempSrc:   PainSc: Asleep                 Didi Ganaway,W. EDMOND

## 2017-11-02 NOTE — Progress Notes (Signed)
Initial Nutrition Assessment  DOCUMENTATION CODES:   Not applicable  INTERVENTION:    Monitor for diet advancement/toleration  Boost Plus chocolate BID- Each supplement provides 360kcal and 14g protein.    Provide MVI daily  NUTRITION DIAGNOSIS:   Increased nutrient needs related to post-op healing as evidenced by estimated needs.  GOAL:   Patient will meet greater than or equal to 90% of their needs  MONITOR:   PO intake, Supplement acceptance, Weight trends, Labs, Diet advancement  REASON FOR ASSESSMENT:   Consult Hip fracture protocol  ASSESSMENT:   Patient with PMH significant for HTN, PVD, DM, CHF, and stomach ulcers. Recently discharged from the hospital two days after being treated for cellulitis. Presents this admission after a mechanical fall resulting in a right hip fracture. Plan for repair 4/22.    Spoke with pt at bedside. Denies any loss in appetite PTA, stating he eats three meals each day with chocolate Boost. However, pt reported to NP that he had decreased energy and decreased oral intake since his discharge from the hospital. Will attempt to obtain more information if possible. Pt is currently NPO for his procedure but would like supplements after discharge.   Pt denies any recent wt loss. Records indicate pt weighed 131 lb 02/19/17 and show a stated weight of 125 lb this admission. Will need to obtain actual weight to quantify actual weight loss. Nutrition-Focused physical exam completed. Pt would not allow me to touch his legs as he was experiencing spasms.   Medications reviewed and include: calcium, Vit D, ferrous sulfate, SSI, IV abx, NS @ 50 ml/hr Labs reviewed.   NUTRITION - FOCUSED PHYSICAL EXAM:    Most Recent Value  Orbital Region  No depletion  Upper Arm Region  Mild depletion  Thoracic and Lumbar Region  Unable to assess  Buccal Region  Mild depletion  Temple Region  Mild depletion  Clavicle Bone Region  Moderate depletion  Clavicle and  Acromion Bone Region  Moderate depletion  Scapular Bone Region  Unable to assess  Dorsal Hand  Mild depletion  Patellar Region  Unable to assess  Anterior Thigh Region  Unable to assess  Posterior Calf Region  Unable to assess  Edema (RD Assessment)  Unable to assess     Diet Order:  Diet NPO time specified Except for: Sips with Meds  EDUCATION NEEDS:   Not appropriate for education at this time  Skin:  Skin Assessment: Skin Integrity Issues: Skin Integrity Issues:: Other (Comment) Other: right leg skin tear  Last BM:  10/31/17  Height:   Ht Readings from Last 1 Encounters:  10/31/17 5\' 3"  (1.6 m)    Weight:   Wt Readings from Last 1 Encounters:  10/31/17 125 lb (56.7 kg)    Ideal Body Weight:  56.4 kg  BMI:  Body mass index is 22.14 kg/m.  Estimated Nutritional Needs:   Kcal:  1750-1950 kcal  Protein:  90-100 g  Fluid:  >1.7 L/day    Mariana Single RD, LDN Clinical Nutrition Pager # [phone]

## 2017-11-02 NOTE — Op Note (Signed)
NAME:  Logan Bean, Logan Bean                ACCOUNT NO.:  [id number]   MEDICAL RECORD NO.: [id number]   LOCATION:  5056                         FACILITY:  [facility] OF BIRTH:  1920-08-06  PHYSICIAN:  Pietro Cassis. Alvan Dame, M.D.     DATE OF PROCEDURE:  11/02/2017                               OPERATIVE REPORT     PREOPERATIVE DIAGNOSIS:  Right displaced femoral neck fracture.   POSTOPERATIVE DIAGNOSIS:  Right displaced femoral neck fracture.   PROCEDURE:  Right hip hemiarthroplasty utilizing DePuy component, size 7 standard Tri-Lock stem with a 34mm unipolar ball with a +0 adapter.   SURGEON:  Pietro Cassis. Alvan Dame, MD   ASSISTANT:  Danae Orleans, PA-C.   ANESTHESIA:  General.   SPECIMENS:  None.   DRAINS:  None.   BLOOD LOSS:  About <300 cc.   COMPLICATIONS:  None.   INDICATION OF PROCEDURE:  Logan Bean is a 84 year old anatomic male who identifies as a male who lives independently in an in-law suite with family.  He unfortunately had a fall at their house landing in his right hip.  He was admitted to the hospital after radiographs revealed a displaced femoral neck fracture.  He was seen and evaluated and was scheduled for surgery for fixation.  The necessity of surgical repair was discussed with she and her family.  Consent was obtained after reviewing risks of infection, DVT, component failure, and need for revision surgery.  He takes Plavix which was stopped >48 hrs prior to [place]:  The patient was brought to the operative theater. Once adequate anesthesia, preoperative antibiotics, 2 g of Ancef administered, the patient was positioned into the left lateral decubitus position with the right side up.  The right lower extremity was then prepped and draped in sterile fashion.  A time-out was performed identifying the patient, planned procedure, and extremity.   A lateral incision was made off the proximal trochanter. Sharp dissection was carried down to the  iliotibial band and gluteal fascia. The gluteal fascia was then incised for posterior approach.  The short external rotators were taken down separate from the posterior capsule. An L capsulotomy was made preserving the posterior leaflet for later anatomic repair. Fracture site was identified and after removing comminuted segments of the posterior femoral neck, the femoral head was removed without difficulty and measured on the back table  using the sizing rings and determined to be 45 mm in diameter.   The proximal femur was then exposed.  Retractors placed.  I then drilled, opened the proximal femur.  Then I hand reamed once and  Irrigated the canal to try to prevent fat emboli.  I began broaching the femur with a starter broach up to a size 7 broach with good medial and lateral metaphyseal fit without evidence of any torsion or movement.  A trial reduction was carried out with a standard offset neck and a +0 adapter with a 46mm trial ball.  The hip reduced nicely.  The leg lengths appeared to be equal compared to the down leg.   The hip went through a range of motion without evidence of any subluxation or  impingement.   Given these findings, the trial components removed.  The final 7 Standard  Tri-Lock stem was opened.  After irrigating the canal, the final stem was impacted and sat at the level where the broach was. Based on this and the trial reduction, a +0 adapter was opened and impacted in the 40mm unipolar ball onto a clean and dry trunnion.  The hip had been irrigated throughout the case and again at this point.  I re- Approximated the posterior capsule to the superior leaflet using a  #1 Vicryl.  The remainder of the wound was closed with #1 Vicryl and #0 V-lock sutures in the iliotibial band and gluteal fascia, a  2-0 Vicryl in the sub-Q tissue and a running 4-0 Monocryl in the skin.  The hip was cleaned, dried, and dressed sterilely using Dermabond and Aquacel dressing.  He  was then brought to recovery room, extubated in stable condition, tolerating the procedure well.  Danae Orleans, PA-C was present and utilized as Environmental consultant for the entire case from  Preoperative positioning to management of the contralateral extremity and retractors to  General facilitation of the procedure.  He was also involved with primary wound closure.         Pietro Cassis Alvan Dame, M.D.

## 2017-11-02 NOTE — OR Nursing (Signed)
[facility] Radiology regarding the status of x-ray.  Advised order was entered as routine, advised her that should have been a STAT order.  She changed status and will send ASAP.

## 2017-11-02 NOTE — Progress Notes (Signed)
PROGRESS NOTE  Logan Bean DVV:[id number] DOB: Oct 17, 1920 DOA: 10/31/2017 PCP: Lauree Chandler, NP  HPI/Recap of past 24 hours: Logan Bean is a 84 y.o. male with medical history significant for HTN, PVD, venous stasis ulcers status post stenting on left SFA, A. Fib CHF presents to the ED c/o right hip pain after mechanical fall PTA on 10/31/17. Denies hitting his head or LOC. In the ED, xray showed R hip fracture. Of note, pt was just discharged from the hospital on 10/28/17 after being treated for left leg cellulitis.  Orthopedics consulted.  Pt admitted for further management.  Today, patient reported severe right hip pain. Pain meds on board. No new complaints   Assessment/Plan: Principal Problem:   Hip fracture (HCC) Active Problems:   Dyslipidemia   GERD (gastroesophageal reflux disease)   CHF (congestive heart failure) (HCC)   Chronic atrial fibrillation (HCC)   Chronic anticoagulation-Eliquis   CKD (chronic kidney disease) stage 3, GFR 30-59 ml/min (HCC)   Fall   Type 2 diabetes mellitus with diabetic chronic kidney disease (HCC)   Iron deficiency anemia secondary to inadequate dietary iron intake   Cellulitis   Abnormal chest x-ray  Right hip fracture 2/2 mechanical fall Pain management Orthopedics on board, for surgery on 11/02/17 Eliquis held PT/OT per Ortho  Leukocytosis Resolved Likely reactive Repeat CBC  Left lower extremity MRSA cellulitis/wound infection Improving Wound cultures grew MRSA Discharged home on Bactrim, to complete dose on 11/03/17 Monitor renal function closely, avoid nephrotoxic, if it worsens, will switch to IV Vanco Daily BMP  ??Pulmonary nodule/mass CT chest showed lobulated mass within the major fissure on the RIGHT measuring 8.3 x 2.1 x 4.7cm. Likely pseudotumor related to previous pleural effusion. Less likely, pleural lipoma or loculated hematoma. Recommend continued radiographic or CT follow-up to document decreased  size Same mass was noted on CXR in 2015 (1.7cm) PCP to follow up  AKI Resolved Creatinine 1.28 on admission, baseline WNL Continue gentle IV fluids Held home lisinopril, avoid nephrotoxics Strict I and O  Chronic A. Fib Rate controlled Chadscore 5 Continue metoprolol, hold Eliquis  Diabetes mellitus type 2 diabetes A1c in January 2019, 7.4 SSI, Accu-Cheks Hold home metformin  Hypertension Stable Continue metoprolol Lasix and lisinopril on hold until course of Bactrim completed  Microcytic anemia Baseline around 11, dropped to 8.8 Likely due to iron deficiency anemia Type and screen done, transfuse if hgb <7 Continue iron supplements    Code Status: DNR  Family Communication: None at bedside  Disposition Plan: Once management complete, likely SNF   Consultants:  Orthopedics  Procedures:  None  Antimicrobials:  P.o. Bactrim  DVT prophylaxis: Held Eliquis for surgery   Objective: Vitals:   11/02/17 0136 11/02/17 0505 11/02/17 1425 11/02/17 1720  BP: (!) 139/91 126/71 140/78   Pulse: 92 (!) 115 99   Resp:  14 16   Temp: 98.2 F (36.8 C) 98.2 F (36.8 C) 98.2 F (36.8 C)   TempSrc: Oral Oral Oral   SpO2:  100% 90%   Weight:    56.7 kg (125 lb)  Height:    5\' 3"  (1.6 m)    Intake/Output Summary (Last 24 hours) at 11/02/2017 1728 Last data filed at 11/02/2017 1431 Gross per 24 hour  Intake 960 ml  Output 550 ml  Net 410 ml   Filed Weights   10/31/17 0922 11/02/17 1720  Weight: 56.7 kg (125 lb) 56.7 kg (125 lb)    Exam:   General: Mild distress due  to pain  Cardiovascular: S1, S2, irregular  Respiratory: CTAB  Abdomen: Soft, nontender, nondistended, bowel sounds present  Musculoskeletal: Right hip TTP, chronic venous stasis noted on BLE, some ulcer noted on LLE  Skin: Chronic venous stasis as noted above  Psychiatry: Normal mood   Data Reviewed: CBC: Recent Labs  Lab 10/28/17 0318 10/31/17 1007 11/01/17 1329  11/02/17 0414  WBC 9.8 15.3* 11.2* 10.1  NEUTROABS  --  13.7*  --  8.4*  HGB 8.6* 9.2* 9.1* 8.8*  HCT 28.1* 30.7* 29.6* 28.6*  MCV 77.6* 76.8* 77.3* 76.7*  PLT 213 305 286 443   Basic Metabolic Panel: Recent Labs  Lab 10/28/17 0318 10/31/17 1007 11/01/17 1329 11/02/17 0414  NA 137 133* 136 136  K 4.4 5.0 4.7 4.8  CL 108 106 107 108  CO2 20* 16* 20* 18*  GLUCOSE 136* 227* 196* 168*  BUN 29* 41* 32* 30*  CREATININE 1.03 1.28* 0.90 0.79  CALCIUM 8.6* 9.5 9.1 8.9   GFR: Estimated Creatinine Clearance (by C-G formula based on SCr of 0.79 mg/dL) Male: 34.8 mL/min Male: 44.3 mL/min Liver Function Tests: Recent Labs  Lab 10/31/17 1007  AST 25  ALT 27  ALKPHOS 88  BILITOT 0.9  PROT 6.1*  ALBUMIN 3.0*   No results for input(s): LIPASE, AMYLASE in the last 168 hours. No results for input(s): AMMONIA in the last 168 hours. Coagulation Profile: Recent Labs  Lab 10/31/17 1007  INR 1.33   Cardiac Enzymes: No results for input(s): CKTOTAL, CKMB, CKMBINDEX, TROPONINI in the last 168 hours. BNP (last 3 results) No results for input(s): PROBNP in the last 8760 hours. HbA1C: No results for input(s): HGBA1C in the last 72 hours. CBG: Recent Labs  Lab 11/01/17 1227 11/01/17 1659 11/01/17 2141 11/02/17 0731 11/02/17 1140  GLUCAP 189* 111* 105* 154* 111*   Lipid Profile: No results for input(s): CHOL, HDL, LDLCALC, TRIG, CHOLHDL, LDLDIRECT in the last 72 hours. Thyroid Function Tests: No results for input(s): TSH, T4TOTAL, FREET4, T3FREE, THYROIDAB in the last 72 hours. Anemia Panel: Recent Labs    11/02/17 0414  FERRITIN 41  TIBC 353  IRON 13*   Urine analysis:    Component Value Date/Time   COLORURINE YELLOW 04/26/2015 2110   APPEARANCEUR CLEAR 04/26/2015 2110   LABSPEC 1.018 04/26/2015 2110   PHURINE 6.0 04/26/2015 2110   GLUCOSEU 250 (A) 04/26/2015 2110   HGBUR TRACE (A) 04/26/2015 2110   BILIRUBINUR NEGATIVE 04/26/2015 2110   KETONESUR NEGATIVE  04/26/2015 2110   PROTEINUR NEGATIVE 04/26/2015 2110   UROBILINOGEN 0.2 04/26/2015 2110   NITRITE NEGATIVE 04/26/2015 2110   LEUKOCYTESUR NEGATIVE 04/26/2015 2110   Sepsis Labs: @LABRCNTIP (procalcitonin:4,lacticidven:4)  ) Recent Results (from the past 240 hour(s))  Blood culture (routine x 2)     Status: None   Collection Time: 10/26/17  4:10 AM  Result Value Ref Range Status   Specimen Description   Final    BLOOD RIGHT FOREARM Performed at [facility], [place] [address] [place], [place] [address]    Special Requests   Final    BOTTLES DRAWN AEROBIC ONLY Blood Culture adequate volume Performed at [facility] [address] [place], [place] [address]    Culture   Final    NO GROWTH 5 DAYS Performed at [facility], [place] [address], [place], [place] [address]    Report Status 10/31/2017 FINAL  Final  Blood culture (routine x 2)     Status: None  Collection Time: 10/26/17  4:10 AM  Result Value Ref Range Status   Specimen Description   Final    BLOOD RIGHT ANTECUBITAL Performed at [facility] [address], [place], [place] [address]    Special Requests   Final    BOTTLES DRAWN AEROBIC ONLY Blood Culture adequate volume Performed at [facility] [address] [place], [place] [address]    Culture   Final    NO GROWTH 5 DAYS Performed at [facility], [place] [address] [place], [place] [address]    Report Status 10/31/2017 FINAL  Final  Aerobic Culture (superficial specimen)     Status: None   Collection Time: 10/26/17  9:40 AM  Result Value Ref Range Status   Specimen Description   Final    WOUND LEFT LEG Performed at [facility] [address], [place], [place] [address]    Special Requests   Final    Normal Performed at [facility], [place] [address], [place], [place] [address]    Gram Stain   Final    NO WBC SEEN NO ORGANISMS  SEEN Performed at [facility], [place] [address] [place],  [address]    Culture   Final    MODERATE METHICILLIN RESISTANT STAPHYLOCOCCUS AUREUS   Report Status 10/28/2017 FINAL  Final   Organism ID, Bacteria METHICILLIN RESISTANT STAPHYLOCOCCUS AUREUS  Final      Susceptibility   Methicillin resistant staphylococcus aureus - MIC*    CIPROFLOXACIN >=8 RESISTANT Resistant     ERYTHROMYCIN >=8 RESISTANT Resistant     GENTAMICIN <=0.5 SENSITIVE Sensitive     OXACILLIN >=4 RESISTANT Resistant     TETRACYCLINE >=16 RESISTANT Resistant     VANCOMYCIN 1 SENSITIVE Sensitive     TRIMETH/SULFA <=10 SENSITIVE Sensitive     CLINDAMYCIN <=0.25 SENSITIVE Sensitive     RIFAMPIN <=0.5 SENSITIVE Sensitive     Inducible Clindamycin NEGATIVE Sensitive     * MODERATE METHICILLIN RESISTANT STAPHYLOCOCCUS AUREUS  MRSA PCR Screening     Status: None   Collection Time: 11/01/17 12:21 AM  Result Value Ref Range Status   MRSA by PCR NEGATIVE NEGATIVE Final    Comment:        The GeneXpert MRSA Assay (FDA approved for NASAL specimens only), is one component of a comprehensive MRSA colonization surveillance program. It is not intended to diagnose MRSA infection nor to guide or monitor treatment for MRSA infections. Performed at [facility], [place] [address] [place],  [address]       Studies: No results found.  Scheduled Meds: . [MAR Hold] calcium-vitamin D  1 tablet Oral BID WC  . chlorhexidine  60 mL Topical Once  . [MAR Hold] docusate sodium  100 mg Oral BID  . fentaNYL (SUBLIMAZE) injection  50-100 mcg Intravenous UD  . [MAR Hold] ferrous sulfate  325 mg Oral Once per day on Mon Thu  . [MAR Hold] insulin aspart  0-15 Units Subcutaneous TID WC  . [MAR Hold] insulin aspart  0-5 Units Subcutaneous QHS  . [MAR Hold] lactobacillus  1 g Oral TID WC  . [MAR Hold] lactose free nutrition  237 mL Oral BID BM  . [MAR Hold] loratadine  10 mg Oral Daily  . [MAR  Hold] metoprolol tartrate  25 mg Oral BID  . [MAR Hold] multivitamin with minerals  1 tablet Oral Daily  . povidone-iodine  2 application Topical Once  . [  MAR Hold] simvastatin  10 mg Oral QHS  . [MAR Hold] sulfamethoxazole-trimethoprim  1 tablet Oral BID    Continuous Infusions: . sodium chloride 50 mL/hr at 11/02/17 0631  . ceFAZolin    . lactated ringers    . [MAR Hold] methocarbamol (ROBAXIN)  IV       LOS: 2 days     Alma Friendly, MD [facility]   If 7PM-7AM, please contact night-coverage [email or web address] Password [facility] 11/02/2017, 5:28 PM

## 2017-11-02 NOTE — Transfer of Care (Signed)
Immediate Anesthesia Transfer of Care Note  Patient: Logan Bean  Procedure(s) Performed: ARTHROPLASTY BIPOLAR HIP (HEMIARTHROPLASTY) (Right Hip)  Patient Location: PACU  Anesthesia Type:General  Level of Consciousness: awake, alert  and patient cooperative  Airway & Oxygen Therapy: Patient Spontanous Breathing and Patient connected to face mask oxygen  Post-op Assessment: Report given to RN, Post -op Vital signs reviewed and stable and Patient moving all extremities X 4  Post vital signs: stable  Last Vitals:  Vitals Value Taken Time  BP 158/92 11/02/2017  8:15 PM  Temp    Pulse 105 11/02/2017  8:22 PM  Resp    SpO2 91 % 11/02/2017  8:22 PM  Vitals shown include unvalidated device data.  Last Pain:  Vitals:   11/02/17 1425  TempSrc: Oral  PainSc: Asleep      Patients Stated Pain Goal: 3 (52/77/82 4235)  Complications: No apparent anesthesia complications

## 2017-11-02 NOTE — Anesthesia Procedure Notes (Signed)
Central Venous Catheter Insertion Performed by: Roderic Palau, MD, anesthesiologist Start/End4/22/2019 5:00 PM, 11/02/2017 5:10 PM Patient location: Pre-op. Preanesthetic checklist: patient identified, IV checked, site marked, risks and benefits discussed, surgical consent, monitors and equipment checked, pre-op evaluation, timeout performed and anesthesia consent Position: Trendelenburg Lidocaine 1% used for infiltration and patient sedated Hand hygiene performed , maximum sterile barriers used  and Seldinger technique used Catheter size: 8 Fr Total catheter length 16. Central line was placed.Double lumen Procedure performed using ultrasound guided technique. Ultrasound Notes:anatomy identified, needle tip was noted to be adjacent to the nerve/plexus identified, no ultrasound evidence of intravascular and/or intraneural injection and image(s) printed for medical record Attempts: 1 Following insertion, dressing applied, line sutured and Biopatch. Post procedure assessment: blood return through all ports  Patient tolerated the procedure well with no immediate complications.

## 2017-11-02 NOTE — Anesthesia Procedure Notes (Signed)
Procedure Name: Intubation Date/Time: 11/02/2017 6:07 PM Performed by: Lissa Morales, CRNA Pre-anesthesia Checklist: Patient identified, Emergency Drugs available, Suction available and Patient being monitored Patient Re-evaluated:Patient Re-evaluated prior to induction Oxygen Delivery Method: Circle system utilized Preoxygenation: Pre-oxygenation with 100% oxygen Induction Type: IV induction Ventilation: Mask ventilation without difficulty Laryngoscope Size: Mac and 4 Grade View: Grade II Tube type: Subglottic suction tube Number of attempts: 1 Airway Equipment and Method: Stylet and Oral airway Placement Confirmation: ETT inserted through vocal cords under direct vision,  positive ETCO2 and breath sounds checked- equal and bilateral Secured at: 21 cm Tube secured with: Tape Dental Injury: Teeth and Oropharynx as per pre-operative assessment

## 2017-11-03 ENCOUNTER — Encounter (HOSPITAL_COMMUNITY): Payer: Self-pay | Admitting: Orthopedic Surgery

## 2017-11-03 LAB — GLUCOSE, CAPILLARY
GLUCOSE-CAPILLARY: 207 mg/dL — AB (ref 65–99)
GLUCOSE-CAPILLARY: 147 mg/dL — AB (ref 65–99)
GLUCOSE-CAPILLARY: 123 mg/dL — AB (ref 65–99)
Glucose-Capillary: 178 mg/dL — ABNORMAL HIGH (ref 65–99)

## 2017-11-03 LAB — BASIC METABOLIC PANEL
ANION GAP: 10 (ref 5–15)
Anion gap: 9 (ref 5–15)
BUN: 29 mg/dL — ABNORMAL HIGH (ref 6–20)
BUN: 31 mg/dL — ABNORMAL HIGH (ref 6–20)
CALCIUM: 8.5 mg/dL — AB (ref 8.9–10.3)
CHLORIDE: 109 mmol/L (ref 101–111)
CO2: 18 mmol/L — ABNORMAL LOW (ref 22–32)
CO2: 18 mmol/L — ABNORMAL LOW (ref 22–32)
CREATININE: 1.02 mg/dL (ref 0.61–1.24)
Calcium: 8.4 mg/dL — ABNORMAL LOW (ref 8.9–10.3)
Chloride: 110 mmol/L (ref 101–111)
Creatinine, Ser: 0.99 mg/dL (ref 0.61–1.24)
Glucose, Bld: 198 mg/dL — ABNORMAL HIGH (ref 65–99)
Glucose, Bld: 197 mg/dL — ABNORMAL HIGH (ref 65–99)
POTASSIUM: 4.8 mmol/L (ref 3.5–5.1)
Potassium: 5.2 mmol/L — ABNORMAL HIGH (ref 3.5–5.1)
SODIUM: 137 mmol/L (ref 135–145)
SODIUM: 137 mmol/L (ref 135–145)

## 2017-11-03 LAB — CBC WITH DIFFERENTIAL/PLATELET
BASOS PCT: 1 %
Basophils Absolute: 0.1 10*3/uL (ref 0.0–0.1)
EOS ABS: 0.1 10*3/uL (ref 0.0–0.7)
Eosinophils Relative: 1 %
HCT: 31.1 % — ABNORMAL LOW (ref 39.0–52.0)
HEMOGLOBIN: 9.9 g/dL — AB (ref 13.0–17.0)
LYMPHS PCT: 7 %
Lymphs Abs: 0.7 10*3/uL (ref 0.7–4.0)
MCH: 24.3 pg — ABNORMAL LOW (ref 26.0–34.0)
MCHC: 31.8 g/dL (ref 30.0–36.0)
MCV: 76.4 fL — ABNORMAL LOW (ref 78.0–100.0)
MONO ABS: 0.7 10*3/uL (ref 0.1–1.0)
Monocytes Relative: 7 %
NEUTROS PCT: 84 %
Neutro Abs: 8.7 10*3/uL — ABNORMAL HIGH (ref 1.7–7.7)
PLATELETS: 268 10*3/uL (ref 150–400)
RBC: 4.07 MIL/uL — AB (ref 4.22–5.81)
RDW: 17.2 % — ABNORMAL HIGH (ref 11.5–15.5)
WBC: 10.3 10*3/uL (ref 4.0–10.5)

## 2017-11-03 LAB — URINALYSIS, ROUTINE W REFLEX MICROSCOPIC
Bilirubin Urine: NEGATIVE
Glucose, UA: NEGATIVE mg/dL
KETONES UR: NEGATIVE mg/dL
Nitrite: NEGATIVE
PROTEIN: 30 mg/dL — AB
Specific Gravity, Urine: 1.026 (ref 1.005–1.030)
pH: 5 (ref 5.0–8.0)

## 2017-11-03 LAB — HEPARIN LEVEL (UNFRACTIONATED): Heparin Unfractionated: 0.48 IU/mL (ref 0.30–0.70)

## 2017-11-03 MED ORDER — HEPARIN (PORCINE) IN NACL 100-0.45 UNIT/ML-% IJ SOLN
900.0000 [IU]/h | INTRAMUSCULAR | Status: DC
  Administered 2017-11-03 – 2017-11-04 (×2): 900 [IU]/h via INTRAVENOUS
  Filled 2017-11-03 (×2): qty 250

## 2017-11-03 MED ORDER — ETOMIDATE 2 MG/ML IV SOLN
INTRAVENOUS | Status: DC | PRN
  Administered 2017-11-02: 8 mg via INTRAVENOUS

## 2017-11-03 MED ORDER — SODIUM CHLORIDE 0.9% FLUSH: 10.0000 mL | INTRAVENOUS | Status: DC | PRN

## 2017-11-03 NOTE — Plan of Care (Signed)
  Problem: Health Behavior/Discharge Planning: Goal: Ability to manage health-related needs will improve Outcome: Progressing   Problem: Clinical Measurements: Goal: Ability to maintain clinical measurements within normal limits will improve Outcome: Progressing Goal: Will remain free from infection Outcome: Progressing   Problem: Activity: Goal: Risk for activity intolerance will decrease Outcome: Progressing Note:  PT eval pending   Problem: Elimination: Goal: Will not experience complications related to bowel motility Outcome: Progressing   Problem: Pain Managment: Goal: General experience of comfort will improve Outcome: Progressing   Problem: Safety: Goal: Ability to remain free from injury will improve Outcome: Progressing   Problem: Skin Integrity: Goal: Risk for impaired skin integrity will decrease Outcome: Progressing

## 2017-11-03 NOTE — Progress Notes (Signed)
PROGRESS NOTE  Logan Bean ERD:[id number] DOB: July 31, 1920 DOA: 10/31/2017 PCP: Lauree Chandler, NP  HPI/Recap of past 24 hours: Logan Bean is a 84 y.o. male with medical history significant for HTN, PVD, venous stasis ulcers status post stenting on left SFA, A. Fib CHF presents to the ED c/o right hip pain after mechanical fall PTA on 10/31/17. Denies hitting his head or LOC. In the ED, xray showed R hip fracture. Of note, pt was just discharged from the hospital on 10/28/17 after being treated for left leg cellulitis.  Orthopedics consulted.  Pt admitted for further management.  Today, patient reported post op R hip pain, noted to be moaning intermittently. Was noted to be in afib with RVR post op (pain contributing). SLP done, noted aspiration, pt placed NPO   Assessment/Plan: Principal Problem:   Hip fracture ([place]) Active Problems:   Dyslipidemia   GERD (gastroesophageal reflux disease)   CHF (congestive heart failure) (HCC)   Chronic atrial fibrillation (HCC)   Chronic anticoagulation-Eliquis   CKD (chronic kidney disease) stage 3, GFR 30-59 ml/min (HCC)   Fall   Type 2 diabetes mellitus with diabetic chronic kidney disease (HCC)   Iron deficiency anemia secondary to inadequate dietary iron intake   Cellulitis   Abnormal chest x-ray  Right hip fracture 2/2 mechanical fall S/P R hemiarthroplasty on 11/02/17 Pain management by orthopedics PT/OT per Ortho Likely permanent SNF/NH placement SW on board  Chronic A. Fib with RVR Likely due to post op/pain Placed on diltiazem drip in PACU Chadscore 5 Started IV heparin w/o bolus as pt is currently NPO due to dysphagia Hold home Eliquis  Dysphagia SLP on board: Severe aspiration risk, rec NPO for now SLP will re-evaluate pt Dietician consulted for possible tube feeds after re-evaluation with SLP  NPO, IV meds  Left lower extremity MRSA cellulitis/wound infection Improving Wound cultures grew MRSA Discharged home  on Bactrim, completed dose on 11/03/17 Wound care consulted Monitor renal function closely, avoid nephrotoxic Daily BMP  ??Pulmonary nodule/mass CT chest showed lobulated mass within the major fissure on the RIGHT measuring 8.3 x 2.1 x 4.7cm. Likely pseudotumor related to previous pleural effusion. Less likely, pleural lipoma or loculated hematoma. Recommend continued radiographic or CT follow-up to document decreased size Same mass was noted on CXR in 2015 (1.7cm) PCP to follow up  AKI Resolved Creatinine 1.28 on admission, baseline WNL Continue gentle IV fluids Held home lisinopril, avoid nephrotoxics Strict I and O  Diabetes mellitus type 2 diabetes A1c in January 2019, 7.4 SSI, Accu-Cheks Hold home metformin  Hypertension Stable Lasix and lisinopril on hold  Microcytic anemia Baseline around 11, dropped to 8.8 Likely due to iron deficiency anemia Type and screen done, transfuse if hgb <7 Continue iron supplements    Code Status: DNR  Family Communication: Spoke with POA over phone on 11/03/17  Disposition Plan: Once management complete, likely SNF   Consultants:  Orthopedics  Procedures:  None  Antimicrobials:  None  DVT prophylaxis: IV Heparin   Objective: Vitals:   11/02/17 2306 11/03/17 0039 11/03/17 0134 11/03/17 1103  BP: 138/61 128/74 (!) 108/59 (!) 138/51  Pulse: (!) 102 95 92 77  Resp: 20 (!) 22 20 (!) 22  Temp: 98.3 F (36.8 C) 98.5 F (36.9 C) 99.5 F (37.5 C)   TempSrc: Oral Oral Oral   SpO2: 96% 100% 100% 98%  Weight: 66.6 kg (146 lb 13.2 oz)     Height: 5\' 3"  (1.6 m)  Intake/Output Summary (Last 24 hours) at 11/03/2017 1548 Last data filed at 11/03/2017 1400 Gross per 24 hour  Intake 3037.46 ml  Output 875 ml  Net 2162.46 ml   Filed Weights   10/31/17 0922 11/02/17 1720 11/02/17 2306  Weight: 56.7 kg (125 lb) 56.7 kg (125 lb) 66.6 kg (146 lb 13.2 oz)    Exam:   General: Mild distress due to  pain  Cardiovascular: S1, S2, irregular  Respiratory: CTAB  Abdomen: Soft, nontender, nondistended, bowel sounds present  Musculoskeletal: Right hip TTP, chronic venous stasis noted on BLE, some ulcer noted on LLE  Skin: Chronic venous stasis as noted above  Psychiatry: Normal mood   Data Reviewed: CBC: Recent Labs  Lab 10/28/17 0318 10/31/17 1007 11/01/17 1329 11/02/17 0414 11/03/17 0339  WBC 9.8 15.3* 11.2* 10.1 10.3  NEUTROABS  --  13.7*  --  8.4* 8.7*  HGB 8.6* 9.2* 9.1* 8.8* 9.9*  HCT 28.1* 30.7* 29.6* 28.6* 31.1*  MCV 77.6* 76.8* 77.3* 76.7* 76.4*  PLT 213 305 286 296 371   Basic Metabolic Panel: Recent Labs  Lab 10/31/17 1007 11/01/17 1329 11/02/17 0414 11/03/17 0339 11/03/17 0751  NA 133* 136 136 137 137  K 5.0 4.7 4.8 4.8 5.2*  CL 106 107 108 109 110  CO2 16* 20* 18* 18* 18*  GLUCOSE 227* 196* 168* 198* 197*  BUN 41* 32* 30* 29* 31*  CREATININE 1.28* 0.90 0.79 0.99 1.02  CALCIUM 9.5 9.1 8.9 8.4* 8.5*   GFR: Estimated Creatinine Clearance (by C-G formula based on SCr of 1.02 mg/dL) Male: 30.3 mL/min Male: 34.9 mL/min Liver Function Tests: Recent Labs  Lab 10/31/17 1007  AST 25  ALT 27  ALKPHOS 88  BILITOT 0.9  PROT 6.1*  ALBUMIN 3.0*   No results for input(s): LIPASE, AMYLASE in the last 168 hours. No results for input(s): AMMONIA in the last 168 hours. Coagulation Profile: Recent Labs  Lab 10/31/17 1007  INR 1.33   Cardiac Enzymes: No results for input(s): CKTOTAL, CKMB, CKMBINDEX, TROPONINI in the last 168 hours. BNP (last 3 results) No results for input(s): PROBNP in the last 8760 hours. HbA1C: No results for input(s): HGBA1C in the last 72 hours. CBG: Recent Labs  Lab 11/02/17 1140 11/02/17 2007 11/02/17 2118 11/03/17 0733 11/03/17 1207  GLUCAP 111* 220* 210* 207* 178*   Lipid Profile: No results for input(s): CHOL, HDL, LDLCALC, TRIG, CHOLHDL, LDLDIRECT in the last 72 hours. Thyroid Function Tests: No results for  input(s): TSH, T4TOTAL, FREET4, T3FREE, THYROIDAB in the last 72 hours. Anemia Panel: Recent Labs    11/02/17 0414  FERRITIN 41  TIBC 353  IRON 13*   Urine analysis:    Component Value Date/Time   COLORURINE AMBER (A) 11/03/2017 0041   APPEARANCEUR HAZY (A) 11/03/2017 0041   LABSPEC 1.026 11/03/2017 0041   PHURINE 5.0 11/03/2017 0041   GLUCOSEU NEGATIVE 11/03/2017 0041   HGBUR SMALL (A) 11/03/2017 0041   BILIRUBINUR NEGATIVE 11/03/2017 0041   KETONESUR NEGATIVE 11/03/2017 0041   PROTEINUR 30 (A) 11/03/2017 0041   UROBILINOGEN 0.2 04/26/2015 2110   NITRITE NEGATIVE 11/03/2017 0041   LEUKOCYTESUR TRACE (A) 11/03/2017 0041   Sepsis Labs: @LABRCNTIP (procalcitonin:4,lacticidven:4)  ) Recent Results (from the past 240 hour(s))  Blood culture (routine x 2)     Status: None   Collection Time: 10/26/17  4:10 AM  Result Value Ref Range Status   Specimen Description   Final    BLOOD RIGHT FOREARM Performed at [facility], [place] [address] [place], [place] [address]    Special Requests   Final    BOTTLES DRAWN AEROBIC ONLY Blood Culture adequate volume Performed at [place] [address], [place], [place] [address]    Culture   Final    NO GROWTH 5 DAYS Performed at [facility], [place] [address] [place], [place] [address]    Report Status 10/31/2017 FINAL  Final  Blood culture (routine x 2)     Status: None   Collection Time: 10/26/17  4:10 AM  Result Value Ref Range Status   Specimen Description   Final    BLOOD RIGHT ANTECUBITAL Performed at [facility] [address], [place], [place] [address]    Special Requests   Final    BOTTLES DRAWN AEROBIC ONLY Blood Culture adequate volume Performed at [place] [address], [place], [place] [address]    Culture   Final    NO GROWTH 5 DAYS Performed at [facility], [place] [address], [place], [place] [address]    Report  Status 10/31/2017 FINAL  Final  Aerobic Culture (superficial specimen)     Status: None   Collection Time: 10/26/17  9:40 AM  Result Value Ref Range Status   Specimen Description   Final    WOUND LEFT LEG Performed at [place] [address], [place], [place] [address]    Special Requests   Final    Normal Performed at [facility], [place] [address], [place], [place] [address]    Gram Stain   Final    NO WBC SEEN NO ORGANISMS SEEN Performed at [facility], [place] [address] [place], [place] [address]    Culture   Final    MODERATE METHICILLIN RESISTANT STAPHYLOCOCCUS AUREUS   Report Status 10/28/2017 FINAL  Final   Organism ID, Bacteria METHICILLIN RESISTANT STAPHYLOCOCCUS AUREUS  Final      Susceptibility   Methicillin resistant staphylococcus aureus - MIC*    CIPROFLOXACIN >=8 RESISTANT Resistant     ERYTHROMYCIN >=8 RESISTANT Resistant     GENTAMICIN <=0.5 SENSITIVE Sensitive     OXACILLIN >=4 RESISTANT Resistant     TETRACYCLINE >=16 RESISTANT Resistant     VANCOMYCIN 1 SENSITIVE Sensitive     TRIMETH/SULFA <=10 SENSITIVE Sensitive     CLINDAMYCIN <=0.25 SENSITIVE Sensitive     RIFAMPIN <=0.5 SENSITIVE Sensitive     Inducible Clindamycin NEGATIVE Sensitive     * MODERATE METHICILLIN RESISTANT STAPHYLOCOCCUS AUREUS  MRSA PCR Screening     Status: None   Collection Time: 11/01/17 12:21 AM  Result Value Ref Range Status   MRSA by PCR NEGATIVE NEGATIVE Final    Comment:        The GeneXpert MRSA Assay (FDA approved for NASAL specimens only), is one component of a comprehensive MRSA colonization surveillance program. It is not intended to diagnose MRSA infection nor to guide or monitor treatment for MRSA infections. Performed at [facility], [place] [address], [place],  [address]       Studies: Pelvis Portable  Result Date: 11/02/2017 CLINICAL DATA:  Right hemiarthroplasty EXAM: PORTABLE  PELVIS 1-2 VIEWS COMPARISON:  None. FINDINGS: Uncemented right bipolar hip arthroplasty without immediate postoperative hardware failure nor periprosthetic fracture. No joint dislocation. The native left hip demonstrates degenerative subchondral cystic change of the femoral head with loss of sphericity and mild osteoarthritic joint space narrowing.  Lower lumbar degenerative disc and facet arthropathy seen from L4 through S1. Pelvis appears slightly demineralized without for acute fracture. Common femoral arteriosclerosis is noted. IMPRESSION: 1. No immediate complications status post right uncemented bipolar hip arthroplasty. 2. Osteoarthritis of the native left hip. 3. Lower lumbar degenerative disc and facet arthropathy. Electronically Signed   By: Ashley Royalty M.D.   On: 11/02/2017 22:36    Scheduled Meds: . calcium-vitamin D  1 tablet Oral BID WC  . docusate sodium  100 mg Oral BID  . fentaNYL (SUBLIMAZE) injection  50-100 mcg Intravenous UD  . ferrous sulfate  325 mg Oral TID PC  . insulin aspart  0-15 Units Subcutaneous TID WC  . insulin aspart  0-5 Units Subcutaneous QHS  . lactobacillus  1 g Oral TID WC  . lactose free nutrition  237 mL Oral BID BM  . loratadine  10 mg Oral Daily  . metoprolol tartrate  25 mg Oral BID  . multivitamin with minerals  1 tablet Oral Daily  . simvastatin  10 mg Oral QHS  . sulfamethoxazole-trimethoprim  1 tablet Oral BID    Continuous Infusions: . diltiazem (CARDIZEM) infusion 5 mg/hr (11/03/17 1059)  . heparin 900 Units/hr (11/03/17 1123)  . lactated ringers    . methocarbamol (ROBAXIN)  IV Stopped (11/03/17 1229)     LOS: 3 days     Alma Friendly, MD [facility]   If 7PM-7AM, please contact night-coverage [email or web address] Password [facility] 11/03/2017, 3:48 PM

## 2017-11-03 NOTE — Care Management Important Message (Signed)
Important Message  Patient Details  Name: Logan Bean MRN: [id number] Date of Birth: 1920/07/19   Medicare Important Message Given:  Yes    Kerin Salen 11/03/2017, 1:21 PMImportant Message  Patient Details  Name: Logan Bean MRN: [id number] Date of Birth: 06-05-1921   Medicare Important Message Given:  Yes    Kerin Salen 11/03/2017, 1:21 PM

## 2017-11-03 NOTE — Addendum Note (Signed)
Addendum  created 11/03/17 0732 by Lissa Morales, CRNA   Intraprocedure Meds edited

## 2017-11-03 NOTE — Evaluation (Signed)
Occupational Therapy Evaluation Patient Details Name: Logan Bean MRN: [id number] DOB: 09/19/1920 Today's Date: 11/03/2017    History of Present Illness Logan Bean was admitted after a fall resulting in a R hip fx, s/p hemi arthroplasty. PMH:  HTN, PVD, arthritis, CHF, kidney disease, venous stasis ulcers   Clinical Impression   This 89 year man was admitted for the above.  He tolerated very little activity during evaluation, has a lot of pain and decreased strength.  Will follow in acute setting with the goals below to increase strength and endurance for adls.  Pt states he was mod I prior to admission    Follow Up Recommendations  SNF    Equipment Recommendations  None recommended by OT    Recommendations for Other Services       Precautions / Restrictions Precautions Precautions: Posterior Hip;Fall Precaution Comments: NPO 4/23 MBS pending Restrictions RLE Weight Bearing: Weight bearing as tolerated      Mobility Bed Mobility Overal bed mobility: Needs Assistance             General bed mobility comments: Pt could not tolerate much movement.  Partially rolled with total A and pillow between legs for positioning as well as to slide new draw sheet under pt.  Transfers                      Balance                                           ADL either performed or assessed with clinical judgement   ADL Overall ADL's : Needs assistance/impaired                                       General ADL Comments: pt is NPO at this time.  Needs mostly total A for all ADLs, +2, for LB at bed level. Pt with limited activity tolerance due to pain     Vision         Perception     Praxis      Pertinent Vitals/Pain Pain Assessment: Faces Faces Pain Scale: Hurts whole lot Pain Location: right leg, cramping Pain Descriptors / Indicators: Cramping Pain Intervention(s): Limited activity within patient's tolerance;Monitored  during session;Premedicated before session;Repositioned;Patient requesting pain meds-RN notified     Hand Dominance Right   Extremity/Trunk Assessment Upper Extremity Assessment Upper Extremity Assessment: Generalized weakness;LUE deficits/detail;RUE deficits/detail RUE Deficits / Details: weak:  2+/5 LUE Deficits / Details: hand/arm with swelling could not fully extend fingers. Pt limited movement  2-/5           Communication Communication Communication: No difficulties   Cognition Arousal/Alertness: Awake/alert Behavior During Therapy: WFL for tasks assessed/performed Overall Cognitive Status: No family/caregiver present to determine baseline cognitive functioning                                 General Comments: mostly wfls; decreased initiation at times, may be due to pain   General Comments  positioned LUE on pillows for edema    Exercises     Shoulder Instructions      Home Living Family/patient expects to be discharged to:: Unsure Living Arrangements: Alone;Non-relatives/Friends  Prior Functioning/Environment Level of Independence: Independent;Independent with assistive device(s)                 OT Problem List: Decreased strength;Decreased activity tolerance;Decreased knowledge of use of DME or AE;Decreased knowledge of precautions;Pain;Impaired UE functional use;Increased edema(balance NT)      OT Treatment/Interventions: Self-care/ADL training;Therapeutic exercise;DME and/or AE instruction;Therapeutic activities;Patient/family education    OT Goals(Current goals can be found in the care plan section) Acute Rehab OT Goals Patient Stated Goal: none stated OT Goal Formulation: With patient Time For Goal Achievement: 11/17/17 Potential to Achieve Goals: Fair ADL Goals Pt Will Perform Grooming: with min assist;sitting;bed level Additional ADL Goal #1: pt will perform 10 reps/AAROM vs  AROM to bil ues x 3 motions to increase strength for adls Additional ADL Goal #2: pt will tolerate sitting eob with mod +2 assistance for adls  OT Frequency: Min 2X/week   Barriers to D/C:            Co-evaluation              AM-PAC PT "6 Clicks" Daily Activity     Outcome Measure Help from another person eating meals?: Total Help from another person taking care of personal grooming?: A Lot Help from another person toileting, which includes using toliet, bedpan, or urinal?: Total Help from another person bathing (including washing, rinsing, drying)?: Total Help from another person to put on and taking off regular upper body clothing?: Total Help from another person to put on and taking off regular lower body clothing?: Total 6 Click Score: 7   End of Session    Activity Tolerance: Patient limited by pain Patient left: in bed;with call bell/phone within reach;with bed alarm set  OT Visit Diagnosis: Pain Pain - Right/Left: Right Pain - part of body: Hip                Time: 0383-3383 OT Time Calculation (min): 20 min Charges:  OT General Charges $OT Visit: 1 Visit OT Evaluation $OT Eval Low Complexity: 1 Low G-Codes:     [place], OTR/L [phone] 11/03/2017  Logan Bean 11/03/2017, 4:12 PM

## 2017-11-03 NOTE — Consult Note (Signed)
[facility] Nurse wound consult note Reason for Consult: Patient was seen last week by my partner, M. Austin. Since that time he presented to the ED with Right hip Fracture, had a repair of that yesterday. Will likely now reside in a SNF. Wound type: Venous insufficiency, pressure Pressure Injury POA: Yes Measurement: Pressure injury, Stage 2 to left posterior heel measures 0.4cm x 0.6cm x 0.1cm with pink base and no drainage. Venous ulcer on the left anterior to medial LE measures 2cm x 1.8cm with depth obscured by the presence of yellow adherent slough. No drainage. Right anterior LE with linear skin tear measuring 3cm x 0.4cm x 0.1cm with macerated periwound, scant serous exudate and red wound bed. Wound bed: As described above Drainage (amount, consistency, odor) As described above Periwound:As described above Dressing procedure/placement/frequency: I will implement a conservative POC for topical wound care using xeroform gauze and floatation of heels with pillows.  [facility] nursing team will not follow, but will remain available to this patient, the nursing and medical teams.  Please re-consult if needed. Thanks, Maudie Flakes, MSN, RN, [place], Arther Abbott  Pager# [phone]

## 2017-11-03 NOTE — Progress Notes (Signed)
Patient ID: Logan Bean, adult   DOB: September 02, 1920, 84 y.o.   MRN: [id number] Subjective: 1 Day Post-Op Procedure(s) (LRB): ARTHROPLASTY BIPOLAR HIP (HEMIARTHROPLASTY) (Right)    Patient reports pain as moderate.  Stable overnight, no events reported  Objective:   VITALS:   Vitals:   11/03/17 0134 11/03/17 1103  BP: (!) 108/59 (!) 138/51  Pulse: 92 77  Resp: 20 (!) 22  Temp: 99.5 F (37.5 C)   SpO2: 100% 98%    Neurovascular intact Incision: dressing C/D/I  Pain with movement of right hip/LE  LABS Recent Labs    11/01/17 1329 11/02/17 0414 11/03/17 0339  HGB 9.1* 8.8* 9.9*  HCT 29.6* 28.6* 31.1*  WBC 11.2* 10.1 10.3  PLT 286 296 268    Recent Labs    11/02/17 0414 11/03/17 0339 11/03/17 0751  NA 136 137 137  K 4.8 4.8 5.2*  BUN 30* 29* 31*  CREATININE 0.79 0.99 1.02  GLUCOSE 168* 198* 197*    No results for input(s): LABPT, INR in the last 72 hours.   Assessment/Plan: 1 Day Post-Op Procedure(s) (LRB): ARTHROPLASTY BIPOLAR HIP (HEMIARTHROPLASTY) (Right)   Advance diet Up with therapy   Unfortunately due to declining health and activity level he will likely need transfer to SNF for life.  We will ask for skilled PT for 1 week of this for gait assessment and instruction.  Long term PT may not provide substantial benefit due to pre-operative limitations  Disposition pending work through social work and family

## 2017-11-03 NOTE — Progress Notes (Signed)
ANTICOAGULATION CONSULT NOTE - Initial Consult  Pharmacy Consult for Heparin Indication: atrial fibrillation  Allergies  Allergen Reactions  . Morphine And Related Nausea And Vomiting  . Oxycodone Nausea And Vomiting  . Plavix [Clopidogrel] Rash    Rash on upper body    Patient Measurements: Height: 5\' 3"  (160 cm) Weight: 146 lb 13.2 oz (66.6 kg) IBW/kg (Calculated) : 56.9 Heparin Dosing Weight: actual weight  Vital Signs: Temp: 99.5 F (37.5 C) (04/23 0134) Temp Source: Oral (04/23 0134) BP: 108/59 (04/23 0134) Pulse Rate: 92 (04/23 0134)  Labs: Recent Labs    11/01/17 1329 11/02/17 0414 11/03/17 0339 11/03/17 0751  HGB 9.1* 8.8* 9.9*  --   HCT 29.6* 28.6* 31.1*  --   PLT 286 296 268  --   CREATININE 0.90 0.79 0.99 1.02    Estimated Creatinine Clearance (by C-G formula based on SCr of 1.02 mg/dL) Male: 30.3 mL/min Male: 34.9 mL/min   Medical History: Past Medical History:  Diagnosis Date  . Anxiety   . Arthritis    "probably all over" (10/21/2017)  . Depression   . Diaphragmatic hernia without mention of obstruction or gangrene   . Dizziness and giddiness   . Edema   . GERD (gastroesophageal reflux disease)   . H/O hiatal hernia   . History of gout   . History of kidney stones   . History of stomach ulcers   . Hypercholesteremia   . Hypertension   . Left rotator cuff tear Jul 12, 2012  . PVD (peripheral vascular disease) ([place])   . Stomach ulcer    years  . Type II diabetes mellitus ([place])   . Unspecified hereditary and idiopathic peripheral neuropathy   . Unspecified vitamin D deficiency   . Varicose veins    both legs    Medications:  Scheduled:  . calcium-vitamin D  1 tablet Oral BID WC  . docusate sodium  100 mg Oral BID  . fentaNYL (SUBLIMAZE) injection  50-100 mcg Intravenous UD  . ferrous sulfate  325 mg Oral TID PC  . insulin aspart  0-15 Units Subcutaneous TID WC  . insulin aspart  0-5 Units Subcutaneous QHS  . lactobacillus  1  g Oral TID WC  . lactose free nutrition  237 mL Oral BID BM  . loratadine  10 mg Oral Daily  . metoprolol tartrate  25 mg Oral BID  . multivitamin with minerals  1 tablet Oral Daily  . simvastatin  10 mg Oral QHS  . sulfamethoxazole-trimethoprim  1 tablet Oral BID   Infusions:  . diltiazem (CARDIZEM) infusion 10 mg/hr (11/03/17 1006)  . lactated ringers    . methocarbamol (ROBAXIN)  IV Stopped (11/03/17 0019)   PRN: acetaminophen, bisacodyl, diphenhydrAMINE, fentaNYL (SUBLIMAZE) injection, magnesium citrate, menthol-cetylpyridinium **OR** phenol, methocarbamol **OR** methocarbamol (ROBAXIN)  IV, metoCLOPramide **OR** metoCLOPramide (REGLAN) injection, ondansetron **OR** ondansetron (ZOFRAN) IV, polyethylene glycol, sodium chloride flush  Assessment: 84 yo male admitted s/p fall on 4/20 sustaining a right hip fracture, s/p hemiarthroplasty 4/22 PM.  Patient is on chronic Eliquis for afib, last dose taken 4/20 at 0700.  Postoperatively, ordered to resume Eliquis but patient failed swallow evaluation 4/23, therefore, changing therapy to IV heparin, Pharmacy to dose.  Baseline labs 4/20: PT/INR = 16.4/1.33 Hgb today = 9.9 s/p transfusion 4/22 post-op SCr today 1.02, CrCl ~44 ml/min  Goal of Therapy:  Heparin level 0.3-0.7 units/ml Monitor platelets by anticoagulation protocol: Yes   Plan:   No heparin bolus per MD  Heparin IV infusion 900 units/hr (~14 units/kg/hr)  Check heparin level in 8hrs (no PTT necessary since effects of Eliquis should be diminished at this point)  Daily heparin level and CBC  Monitor closely for signs/symptoms of post-op bleeding  Peggyann Juba, PharmD, BCPS Pager: [phone] 11/03/2017,10:45 AM

## 2017-11-03 NOTE — Progress Notes (Signed)
NP on call, Jeannette Corpus, made aware of Dr. Aurea Graff requests for the on-call provider to be notified when patient transferred to the floor to lay eyes on legs. Also made aware of runs of PVCs per CMD and 2U PRBC ordered but not given in OR. Per verbal conversation, RN to monitor Hgb overnight and CMET ordered now.

## 2017-11-03 NOTE — Evaluation (Signed)
Clinical/Bedside Swallow Evaluation Patient Details  Name: Logan Bean MRN: [id number] Date of Birth: Sep 08, 1920  Today's Date: 11/03/2017 Time: SLP Start Time (ACUTE ONLY): 0915 SLP Stop Time (ACUTE ONLY): 0954 SLP Time Calculation (min) (ACUTE ONLY): 39 min  Past Medical History:  Past Medical History:  Diagnosis Date  . Anxiety   . Arthritis    "probably all over" (10/21/2017)  . Depression   . Diaphragmatic hernia without mention of obstruction or gangrene   . Dizziness and giddiness   . Edema   . GERD (gastroesophageal reflux disease)   . H/O hiatal hernia   . History of gout   . History of kidney stones   . History of stomach ulcers   . Hypercholesteremia   . Hypertension   . Left rotator cuff tear Jul 12, 2012  . PVD (peripheral vascular disease) ([place])   . Stomach ulcer    years  . Type II diabetes mellitus ([place])   . Unspecified hereditary and idiopathic peripheral neuropathy   . Unspecified vitamin D deficiency   . Varicose veins    both legs   Past Surgical History:  Past Surgical History:  Procedure Laterality Date  . ABDOMINAL AORTOGRAM W/LOWER EXTREMITY N/A 10/21/2017   Procedure: ABDOMINAL AORTOGRAM W/LOWER EXTREMITY - Left;  Surgeon: Waynetta Sandy, MD;  Location: [facility];  Service: Cardiovascular;  Laterality: N/A;  . CARDIOVERSION N/A 02/22/2013   Procedure: CARDIOVERSION;  Surgeon: Darlin Coco, MD;  Location: [facility];  Service: Cardiovascular;  Laterality: N/A;  . CATARACT EXTRACTION W/ INTRAOCULAR LENS  IMPLANT, BILATERAL Bilateral   . ESOPHAGEAL DILATION  2003 and 3 yrs ago  . FRACTURE SURGERY    . MASTECTOMY     bilateral  . ORIF ANKLE FRACTURE Right 09/20/2012   Procedure: OPEN REDUCTION INTERNAL FIXATION (ORIF) ANKLE FRACTURE;  Surgeon: Johnn Hai, MD;  Location: [facility];  Service: Orthopedics;  Laterality: Right;  . PERIPHERAL VASCULAR INTERVENTION Left 10/21/2017  . PERIPHERAL VASCULAR INTERVENTION Left  10/21/2017   Procedure: PERIPHERAL VASCULAR INTERVENTION;  Surgeon: Waynetta Sandy, MD;  Location: [facility];  Service: Cardiovascular;  Laterality: Left;  . REVERSE SHOULDER ARTHROPLASTY Right 09/17/2012   Procedure: REVERSE SHOULDER ARTHROPLASTY;  Surgeon: Augustin Schooling, MD;  Location: [facility];  Service: Orthopedics;  Laterality: Right;  . SHOULDER OPEN ROTATOR CUFF REPAIR  11/05/2011   Procedure: ROTATOR CUFF REPAIR SHOULDER OPEN;  Surgeon: Tobi Bastos, MD;  Location: [facility];  Service: Orthopedics;  Laterality: Left;  Left Shoulder Rotator Cuff Repair Open with Graft and Anchors  . TONSILLECTOMY  age 67   HPI:  84 yo male adm to [facility] after fall with right hip pain, s/p surgery - Swallow eval ordered.  PMH + for venous stasis, ulcers, HTN, PVD, recent left leg cellulitis, GERD, HH, esophageal dilatation remotely.  Pt CT head negative, CT chest showed remote rib fxs, ? pseudo tumor right suprahilar opacity.  Pt denies h/o dysphagia prior to admit.    Assessment / Plan / Recommendation Clinical Impression  Pt presents with very elevated aspiration risk at this time due to oral holding across all boluses.  This was ongoing despite max verbal/visual/tactile cues to swallow, dry spoon pressure to tongue to elicit motor function, and use of straw to self feed.  Pt did finally elicit a swallow but then likely overtly aspirated with nectar bolus.  Aspiration resulted in overt coughing - prolonged with face turning red.  SlP ceased po intake at  that time due to aspiration concerns.  Advised pt to results/concerns for aspiration to which he reported understanding using teach back.  Goal today is to provide frequent oral care and for pt to elicit a swallow. Hopeful this is an acute deficit and anticipate improvement, however unfamiliar with pt at baseline.  Pt will benefit from MBS prior to po administration due to aspiration concerns.  Of note, SLP set up oral suction for staff to use if pt  does not elicit swallow with oral care.  SLP Visit Diagnosis: Dysphagia, oropharyngeal phase (R13.12)    Aspiration Risk  Severe aspiration risk;Risk for inadequate nutrition/hydration    Diet Recommendation No liquids;NPO(oral moisture)   Medication Administration: Via alternative means    Other  Recommendations Oral Care Recommendations: Oral care QID   Follow up Recommendations Other (comment)(tbd)      Frequency and Duration min 1 x/week  1 week       Prognosis        Swallow Study   General Date of Onset: 11/03/17 HPI: 84 yo male adm to [facility] after fall with right hip pain, s/p surgery - Swallow eval ordered.  PMH + for venous stasis, ulcers, HTN, PVD, recent left leg cellulitis, GERD, HH, esophageal dilatation remotely.  Pt CT head negative, CT chest showed remote rib fxs, ? pseudo tumor right suprahilar opacity.  Pt denies h/o dysphagia prior to admit.  Type of Study: Bedside Swallow Evaluation Diet Prior to this Study: NPO Temperature Spikes Noted: No Respiratory Status: Room air History of Recent Intubation: Yes(for surgery) Behavior/Cognition: Lethargic/Drowsy;Requires cueing Oral Cavity Assessment: Within Functional Limits Oral Care Completed by SLP: Yes Oral Cavity - Dentition: Adequate natural dentition;Other (Comment)(some dentition missing) Vision: Functional for self-feeding Self-Feeding Abilities: Needs assist Patient Positioning: Upright in bed Baseline Vocal Quality: Normal Volitional Cough: Weak Volitional Swallow: Unable to elicit    Oral/Motor/Sensory Function Overall Oral Motor/Sensory Function: Other (comment)(? mild left facial asymmetry)   Ice Chips Ice chips: Impaired Presentation: Spoon Oral Phase Impairments: (oral holding) Oral Phase Functional Implications: Oral holding   Thin Liquid Thin Liquid: Impaired Presentation: Spoon;Self Fed Oral Phase Impairments: Poor awareness of bolus;Reduced lingual movement/coordination Oral Phase  Functional Implications: Oral holding;Prolonged oral transit    Nectar Thick Nectar Thick Liquid: Impaired Presentation: Spoon;Straw;Self Fed Oral Phase Impairments: Poor awareness of bolus;Reduced lingual movement/coordination Oral phase functional implications: Oral holding Pharyngeal Phase Impairments: Suspected delayed Swallow;Cough - Immediate Other Comments: overt coughing s/p swallow with mild respiratory deficits, suspect aspiration of oral boluses spilling into larynx   Honey Thick Honey Thick Liquid: Not tested   Puree Puree: Impaired Presentation: Spoon Oral Phase Impairments: Poor awareness of bolus;Reduced lingual movement/coordination Oral Phase Functional Implications: Oral holding;Prolonged oral transit   Solid   GO   Solid: Not tested        Macario Golds 11/03/2017,10:31 AM  Luanna Salk, [place] [facility] SLP [phone]

## 2017-11-03 NOTE — Evaluation (Signed)
Physical Therapy Evaluation Patient Details Name: Logan Bean MRN: [id number] DOB: [birth year] Today's Date: 11/03/2017   History of Present Illness  Logan Bean was admitted after a fall resulting in a R hip fx, s/p hemi arthroplasty. PMH:  HTN, PVD, arthritis, CHF, kidney disease, venous stasis ulcers  Clinical Impression  The patient was not able to tolerate mobility/sitting up today. Complains of leg spasms. Pt admitted with above diagnosis. Pt currently with functional limitations due to the deficits listed below (see PT Problem List).  Pt will benefit from skilled PT to increase their independence and safety with mobility to allow discharge to the venue listed below.       Follow Up Recommendations SNF    Equipment Recommendations  None recommended by PT    Recommendations for Other Services       Precautions / Restrictions Precautions Precautions: Posterior Hip;Fall Precaution Comments: NPO 4/23 MBS pending Restrictions RLE Weight Bearing: Weight bearing as tolerated      Mobility  Bed Mobility Overal bed mobility: Needs Assistance             General bed mobility comments: Pt could not tolerate much movement.  Partially rolled with total A and pillow between legs for positioning as well as to slide new draw sheet under pt.  Transfers                    Ambulation/Gait                Stairs            Wheelchair Mobility    Modified Rankin (Stroke Patients Only)       Balance                                             Pertinent Vitals/Pain Pain Assessment: Faces Faces Pain Scale: Hurts whole lot Pain Location: right leg, cramping Pain Descriptors / Indicators: Cramping Pain Intervention(s): Limited activity within patient's tolerance;Monitored during session    Home Living Family/patient expects to be discharged to:: Unsure Living Arrangements: Alone;Non-relatives/Friends                     Prior Function Level of Independence: Independent;Independent with assistive device(s)               Hand Dominance   Dominant Hand: Right    Extremity/Trunk Assessment   Upper Extremity Assessment Upper Extremity Assessment: Defer to OT evaluation RUE Deficits / Details: weak:  2+/5 LUE Deficits / Details: hand/arm with swelling could not fully extend fingers. Pt limited movement  2-/5    Lower Extremity Assessment Lower Extremity Assessment: RLE deficits/detail;LLE deficits/detail RLE Deficits / Details: does not tolerate ROM LLE Deficits / Details: able to flex knee       Communication   Communication: No difficulties  Cognition Arousal/Alertness: Awake/alert Behavior During Therapy: WFL for tasks assessed/performed Overall Cognitive Status: No family/caregiver present to determine baseline cognitive functioning                                 General Comments: mostly wfls; decreased initiation at times, may be due to pain      General Comments General comments (skin integrity, edema, etc.): positioned LUE on pillows for edema    Exercises  Assessment/Plan    PT Assessment Patient needs continued PT services  PT Problem List Decreased strength;Decreased activity tolerance;Decreased mobility;Pain       PT Treatment Interventions DME instruction;Therapeutic exercise;Gait training;Functional mobility training;Therapeutic activities;Patient/family education    PT Goals (Current goals can be found in the Care Plan section)  Acute Rehab PT Goals Patient Stated Goal: none stated PT Goal Formulation: Patient unable to participate in goal setting Time For Goal Achievement: 11/17/17 Potential to Achieve Goals: Fair    Frequency Min 2X/week   Barriers to discharge Decreased caregiver support      Co-evaluation PT/OT/SLP Co-Evaluation/Treatment: Yes Reason for Co-Treatment: Complexity of the patient's impairments (multi-system  involvement) PT goals addressed during session: Mobility/safety with mobility OT goals addressed during session: ADL's and self-care       AM-PAC PT "6 Clicks" Daily Activity  Outcome Measure Difficulty turning over in bed (including adjusting bedclothes, sheets and blankets)?: Unable Difficulty moving from lying on back to sitting on the side of the bed? : Unable Difficulty sitting down on and standing up from a chair with arms (e.g., wheelchair, bedside commode, etc,.)?: Unable Help needed moving to and from a bed to chair (including a wheelchair)?: Total Help needed walking in hospital room?: Total Help needed climbing 3-5 steps with a railing? : Total 6 Click Score: 6    End of Session   Activity Tolerance: Patient limited by pain Patient left: in bed;with bed alarm set;with call bell/phone within reach Nurse Communication: Mobility status PT Visit Diagnosis: Unsteadiness on feet (R26.81)    Time: 9758-8325 PT Time Calculation (min) (ACUTE ONLY): 20 min   Charges:   PT Evaluation $PT Eval Low Complexity: 1 Low     PT G CodesTresa Bean PT [phone]  Logan Bean 11/03/2017, 5:27 PM

## 2017-11-03 NOTE — Progress Notes (Signed)
ANTICOAGULATION CONSULT NOTE - Initial Consult  Pharmacy Consult for Heparin Indication: atrial fibrillation  Allergies  Allergen Reactions  . Morphine And Related Nausea And Vomiting  . Oxycodone Nausea And Vomiting  . Plavix [Clopidogrel] Rash    Rash on upper body    Patient Measurements: Height: 5\' 3"  (160 cm) Weight: 146 lb 13.2 oz (66.6 kg) IBW/kg (Calculated) : 56.9 Heparin Dosing Weight: actual weight  Vital Signs: Temp: 98.8 F (37.1 C) (04/23 1657) Temp Source: Oral (04/23 1657) BP: 134/58 (04/23 1657) Pulse Rate: 87 (04/23 1657)  Labs: Recent Labs    11/01/17 1329 11/02/17 0414 11/03/17 0339 11/03/17 0751 11/03/17 2133  HGB 9.1* 8.8* 9.9*  --   --   HCT 29.6* 28.6* 31.1*  --   --   PLT 286 296 268  --   --   HEPARINUNFRC  --   --   --   --  0.48  CREATININE 0.90 0.79 0.99 1.02  --     Estimated Creatinine Clearance (by C-G formula based on SCr of 1.02 mg/dL) Male: 30.3 mL/min Male: 34.9 mL/min   Medical History: Past Medical History:  Diagnosis Date  . Anxiety   . Arthritis    "probably all over" (10/21/2017)  . Depression   . Diaphragmatic hernia without mention of obstruction or gangrene   . Dizziness and giddiness   . Edema   . GERD (gastroesophageal reflux disease)   . H/O hiatal hernia   . History of gout   . History of kidney stones   . History of stomach ulcers   . Hypercholesteremia   . Hypertension   . Left rotator cuff tear Jul 12, 2012  . PVD (peripheral vascular disease) ([place])   . Stomach ulcer    years  . Type II diabetes mellitus ([place])   . Unspecified hereditary and idiopathic peripheral neuropathy   . Unspecified vitamin D deficiency   . Varicose veins    both legs    Medications:  Scheduled:  . calcium-vitamin D  1 tablet Oral BID WC  . docusate sodium  100 mg Oral BID  . fentaNYL (SUBLIMAZE) injection  50-100 mcg Intravenous UD  . ferrous sulfate  325 mg Oral TID PC  . insulin aspart  0-15 Units Subcutaneous  TID WC  . insulin aspart  0-5 Units Subcutaneous QHS  . lactobacillus  1 g Oral TID WC  . lactose free nutrition  237 mL Oral BID BM  . loratadine  10 mg Oral Daily  . metoprolol tartrate  25 mg Oral BID  . multivitamin with minerals  1 tablet Oral Daily  . simvastatin  10 mg Oral QHS   Infusions:  . diltiazem (CARDIZEM) infusion 5 mg/hr (11/03/17 1059)  . heparin 900 Units/hr (11/03/17 1123)  . lactated ringers    . methocarbamol (ROBAXIN)  IV Stopped (11/03/17 1229)   PRN: acetaminophen, bisacodyl, diphenhydrAMINE, fentaNYL (SUBLIMAZE) injection, magnesium citrate, menthol-cetylpyridinium **OR** phenol, methocarbamol **OR** methocarbamol (ROBAXIN)  IV, metoCLOPramide **OR** metoCLOPramide (REGLAN) injection, ondansetron **OR** ondansetron (ZOFRAN) IV, polyethylene glycol, sodium chloride flush  Assessment: 84 yo male admitted s/p fall on 4/20 sustaining a right hip fracture, s/p hemiarthroplasty 4/22 PM.  Patient is on chronic Eliquis for afib, last dose taken 4/20 at 0700.  Postoperatively, ordered to resume Eliquis but patient failed swallow evaluation 4/23, therefore, changing therapy to IV heparin, Pharmacy to dose.  Baseline labs 4/20: PT/INR = 16.4/1.33 Hgb today = 9.9 s/p transfusion 4/22 post-op SCr today 1.02, CrCl ~  44 ml/min  2nd shift update: Heparin level = 0.48 with heparin infusing @ 900 units/hr No complications of therapy noted  Goal of Therapy:  Heparin level 0.3-0.7 units/ml Monitor platelets by anticoagulation protocol: Yes   Plan:   Continue Heparin IV infusion 900 units/hr (~14 units/kg/hr)  Daily heparin level and CBC  Monitor closely for signs/symptoms of post-op bleeding  Leone Haven, PharmD 11/03/2017,9:58 PM

## 2017-11-03 NOTE — Addendum Note (Signed)
Addendum  created 11/03/17 1321 by Lissa Morales, CRNA   Charge Capture section accepted

## 2017-11-04 DIAGNOSIS — E785 Hyperlipidemia, unspecified: Secondary | ICD-10-CM

## 2017-11-04 LAB — BASIC METABOLIC PANEL
ANION GAP: 8 (ref 5–15)
BUN: 30 mg/dL — ABNORMAL HIGH (ref 6–20)
CALCIUM: 8.4 mg/dL — AB (ref 8.9–10.3)
CO2: 19 mmol/L — ABNORMAL LOW (ref 22–32)
Chloride: 110 mmol/L (ref 101–111)
Creatinine, Ser: 0.9 mg/dL (ref 0.61–1.24)
Glucose, Bld: 131 mg/dL — ABNORMAL HIGH (ref 65–99)
POTASSIUM: 4.6 mmol/L (ref 3.5–5.1)
Sodium: 137 mmol/L (ref 135–145)

## 2017-11-04 LAB — CBC WITH DIFFERENTIAL/PLATELET
BASOS ABS: 0.1 10*3/uL (ref 0.0–0.1)
BASOS PCT: 1 %
EOS PCT: 2 %
Eosinophils Absolute: 0.2 10*3/uL (ref 0.0–0.7)
HEMATOCRIT: 29.9 % — AB (ref 39.0–52.0)
Hemoglobin: 9.4 g/dL — ABNORMAL LOW (ref 13.0–17.0)
Lymphocytes Relative: 9 %
Lymphs Abs: 0.8 10*3/uL (ref 0.7–4.0)
MCH: 24.7 pg — ABNORMAL LOW (ref 26.0–34.0)
MCHC: 31.4 g/dL (ref 30.0–36.0)
MCV: 78.5 fL (ref 78.0–100.0)
MONO ABS: 0.9 10*3/uL (ref 0.1–1.0)
Monocytes Relative: 10 %
NEUTROS ABS: 7.1 10*3/uL (ref 1.7–7.7)
Neutrophils Relative %: 78 %
PLATELETS: 286 10*3/uL (ref 150–400)
RBC: 3.81 MIL/uL — AB (ref 4.22–5.81)
RDW: 18.5 % — AB (ref 11.5–15.5)
WBC: 9 10*3/uL (ref 4.0–10.5)

## 2017-11-04 LAB — URINE CULTURE: Culture: NO GROWTH

## 2017-11-04 LAB — GLUCOSE, CAPILLARY
GLUCOSE-CAPILLARY: 126 mg/dL — AB (ref 65–99)
GLUCOSE-CAPILLARY: 115 mg/dL — AB (ref 65–99)
Glucose-Capillary: 146 mg/dL — ABNORMAL HIGH (ref 65–99)
Glucose-Capillary: 108 mg/dL — ABNORMAL HIGH (ref 65–99)

## 2017-11-04 LAB — HEPARIN LEVEL (UNFRACTIONATED): Heparin Unfractionated: 0.36 IU/mL (ref 0.30–0.70)

## 2017-11-04 MED ORDER — SODIUM CHLORIDE 0.9 % IV SOLN
INTRAVENOUS | Status: DC
  Administered 2017-11-04 – 2017-11-05 (×3): via INTRAVENOUS

## 2017-11-04 MED ORDER — FENTANYL CITRATE (PF) 100 MCG/2ML IJ SOLN
25.0000 ug | INTRAMUSCULAR | Status: DC | PRN
  Administered 2017-11-04 – 2017-11-05 (×15): 25 ug via INTRAVENOUS
  Filled 2017-11-04 (×15): qty 2

## 2017-11-04 NOTE — Progress Notes (Signed)
  Speech Language Pathology Treatment: Dysphagia  Patient Details Name: Logan Bean MRN: [id number] DOB: Feb 04, 1921 Today's Date: 11/04/2017 Time: 2542-7062 SLP Time Calculation (min) (ACUTE ONLY): 15 min  Assessment / Plan / Recommendation Clinical Impression  Pt seen today to assess readiness for po diet and/or instrumental swallow evaluation.  Despite pt's best efforts, he is not able to consistently swallow on command nor cough strongly to clear secretions.    Baseline cough noted with oral moisture - ? retention of secretions in pharynx/larynx.  HCPOA x2 in room report pt does have some history of "spasms" impacting his swallowing and recent coughing/difficulty swallowing meat loaf during most recent hospitalization.  HCPOA's also report pt has had his esophagus "stretched" previously.  This information causes SLP to suspect baseline dysphagia that patient was able to manage when well but given fall s/p surgery and deconditioning, it has become unmanageable.  Informed pt and HCPOA's to said information.    Pt provided with oral care followed by administration of single ice chips and teaspoons of apple juice.  Delayed swallow noted WHEN initiated - but unfortunately pt did not initiate swallow especially with ice chips.  Suspect premature spillage of apple juice via tsp with subsequent coughing likely indicative of aspiration.  Multiple swallows noted when initiated concerning for pharyngeal/cervical esophageal dysphagia. Pt with non-functional swallow at this time due to oropharyngeal and suspected esophageal deficits.    At this time, patient is high aspiration, malnutrition risk.  Per HCPOA's pt is to have palliative referral meeting and thus do not recommend MBS at this time as suspect will only diagnose severe dysphagia and high aspiration/malnutrition risk.  All in agreement to plan.   Pt indicated desire for po intake but after education to aspiration event yesterday causing him mild  respiratory distress with overt coughing - he reported understanding to clinical reasoning for not having po.  Provided pt and HCPOA's with toothettes and apple juice to allow pt to have "flavor" with least aspiration risk. Observed HCPOA providing pt with oral moisture and repositioned pt for airway protection.     Rec NPO  - oral moisture with toothette using drink of pt choice with strict precautions           HPI HPI: 84 yo male adm to [facility] after fall with right hip pain, s/p surgery - Swallow eval ordered.  PMH + for venous stasis, ulcers, HTN, PVD, recent left leg cellulitis, GERD, HH, esophageal dilatation remotely.  Pt CT head negative, CT chest showed remote rib fxs, ? pseudo tumor right suprahilar opacity.  Pt denies h/o dysphagia prior to admit.   Today pt seen to assess readiness for instrumental evaluation and/or po intake.       SLP Plan  Continue with current plan of care       Recommendations  Diet recommendations: NPO(oral moisture of flavored drinks via toothette with aspiration precautions) Liquids provided via: (toothette) Medication Administration: Via alternative means Supervision: Full supervision/cueing for compensatory strategies Compensations: (assure pt swallows, sitting fully upright with strict precautions)                Follow up Recommendations: Other (comment)(tbd) SLP Visit Diagnosis: Dysphagia, pharyngoesophageal phase (R13.14);Dysphagia, oropharyngeal phase (R13.12) Plan: Continue with current plan of care       [place], [place] [facility] SLP [phone]  Macario Golds 11/04/2017, 4:51 PM

## 2017-11-04 NOTE — Progress Notes (Signed)
     Subjective: 2 Days Post-Op Procedure(s) (LRB): ARTHROPLASTY BIPOLAR HIP (HEMIARTHROPLASTY) (Right)   Patient reports pain as moderate to severe.  They state that the muscle spasms are what is causing the worst pain. No other event throughout the night.  Objective:   VITALS:   Vitals:   11/03/17 2035 11/04/17 0527  BP: 129/70 128/70  Pulse: 77 (!) 129  Resp: 20   Temp: 99 F (37.2 C) 98 F (36.7 C)  SpO2: 98% 98%    Dorsiflexion/Plantar flexion intact Incision: dressing C/D/I No cellulitis present Compartment soft  LABS Recent Labs    11/02/17 0414 11/03/17 0339 11/04/17 0535  HGB 8.8* 9.9* 9.4*  HCT 28.6* 31.1* 29.9*  WBC 10.1 10.3 9.0  PLT 296 268 286    Recent Labs    11/03/17 0339 11/03/17 0751 11/04/17 0535  NA 137 137 137  K 4.8 5.2* 4.6  BUN 29* 31* 30*  CREATININE 0.99 1.02 0.90  GLUCOSE 198* 197* 131*     Assessment/Plan: 2 Days Post-Op Procedure(s) (LRB): ARTHROPLASTY BIPOLAR HIP (HEMIARTHROPLASTY) (Right)   Up with therapy if able Discharge disposition TBD Had a discussion with the hospitalist and pharmacist, the robaxin medication interacts with 2 of the medications he is currently receiving through the 2 IV access thyn have.  After a discussion it was decided to d/c the heparin drip for 30 minutes, flush the line and then try the IV robaxin. Then after it is completed to again flush the line and restart the heparin.  Patient is having trouble aspirating anything by mouth and has not been able to use oral muscle relaxer.    Logan Bean   PAC  11/04/2017, 8:44 AM

## 2017-11-04 NOTE — Progress Notes (Signed)
Dr Alfredia Ferguson paged and made aware pt UO ~377ml this shift.  Pt NPO x 2 days, no IVF ordered. No new orders noted.

## 2017-11-04 NOTE — Plan of Care (Signed)
  Problem: Clinical Measurements: Goal: Ability to maintain clinical measurements within normal limits will improve Outcome: Progressing   Problem: Elimination: Goal: Will not experience complications related to bowel motility Outcome: Progressing   Problem: Pain Managment: Goal: General experience of comfort will improve Outcome: Progressing Note:  IV Fentanyl and Robaxin.    Problem: Safety: Goal: Ability to remain free from injury will improve Outcome: Progressing   Problem: Skin Integrity: Goal: Risk for impaired skin integrity will decrease Outcome: Progressing Note:  Pt placed on low air loss bed. T&P as pt allows.

## 2017-11-04 NOTE — Progress Notes (Signed)
ANTICOAGULATION CONSULT NOTE - Follow Up Consult  Pharmacy Consult for Heparin Indication: atrial fibrillation  Allergies  Allergen Reactions  . Morphine And Related Nausea And Vomiting  . Oxycodone Nausea And Vomiting  . Plavix [Clopidogrel] Rash    Rash on upper body    Patient Measurements: Height: 5\' 3"  (160 cm) Weight: 146 lb 13.2 oz (66.6 kg) IBW/kg (Calculated) : 56.9 Heparin Dosing Weight: actual weight  Vital Signs: Temp: 98 F (36.7 C) (04/24 0527) Temp Source: Oral (04/24 0527) BP: 128/70 (04/24 0527) Pulse Rate: 129 (04/24 0527)  Labs: Recent Labs    11/02/17 0414 11/03/17 0339 11/03/17 0751 11/03/17 2133 11/04/17 0535  HGB 8.8* 9.9*  --   --  9.4*  HCT 28.6* 31.1*  --   --  29.9*  PLT 296 268  --   --  286  HEPARINUNFRC  --   --   --  0.48 0.36  CREATININE 0.79 0.99 1.02  --  0.90    Estimated Creatinine Clearance (by C-G formula based on SCr of 0.9 mg/dL) Male: 34.3 mL/min Male: 39.5 mL/min   Medical History: Past Medical History:  Diagnosis Date  . Anxiety   . Arthritis    "probably all over" (10/21/2017)  . Depression   . Diaphragmatic hernia without mention of obstruction or gangrene   . Dizziness and giddiness   . Edema   . GERD (gastroesophageal reflux disease)   . H/O hiatal hernia   . History of gout   . History of kidney stones   . History of stomach ulcers   . Hypercholesteremia   . Hypertension   . Left rotator cuff tear Jul 12, 2012  . PVD (peripheral vascular disease) ([place])   . Stomach ulcer    years  . Type II diabetes mellitus ([place])   . Unspecified hereditary and idiopathic peripheral neuropathy   . Unspecified vitamin D deficiency   . Varicose veins    both legs    Medications:  Scheduled:  . calcium-vitamin D  1 tablet Oral BID WC  . docusate sodium  100 mg Oral BID  . fentaNYL (SUBLIMAZE) injection  50-100 mcg Intravenous UD  . ferrous sulfate  325 mg Oral TID PC  . insulin aspart  0-15 Units Subcutaneous  TID WC  . insulin aspart  0-5 Units Subcutaneous QHS  . lactobacillus  1 g Oral TID WC  . lactose free nutrition  237 mL Oral BID BM  . loratadine  10 mg Oral Daily  . metoprolol tartrate  25 mg Oral BID  . multivitamin with minerals  1 tablet Oral Daily  . simvastatin  10 mg Oral QHS   Infusions:  . diltiazem (CARDIZEM) infusion 5 mg/hr (11/04/17 0148)  . heparin 900 Units/hr (11/03/17 1123)  . lactated ringers    . methocarbamol (ROBAXIN)  IV Stopped (11/03/17 1229)   PRN: acetaminophen, bisacodyl, diphenhydrAMINE, fentaNYL (SUBLIMAZE) injection, magnesium citrate, menthol-cetylpyridinium **OR** phenol, methocarbamol **OR** methocarbamol (ROBAXIN)  IV, metoCLOPramide **OR** metoCLOPramide (REGLAN) injection, ondansetron **OR** ondansetron (ZOFRAN) IV, polyethylene glycol, sodium chloride flush  Assessment: 85 yo male admitted s/p fall on 4/20 sustaining a right hip fracture, s/p hemiarthroplasty 4/22 PM.  Patient is on chronic Eliquis for afib, last dose taken 4/20 at 0700.  Postoperatively, ordered to resume Eliquis but patient failed swallow evaluation 4/23, therefore, changing therapy to IV heparin, Pharmacy to dose.  Today, 11/04/2017  Heparin level is therapeutic (0.36) on 900 units/hr  Hgb today = 9.4 s/p transfusion 4/22  post-op  SCr today improved 0.9, CrCl ~49 ml/min  Goal of Therapy:  Heparin level 0.3-0.7 units/ml Monitor platelets by anticoagulation protocol: Yes   Plan:   Continue Heparin IV infusion 900 units/hr (~14 units/kg/hr)  Daily heparin level and CBC  Monitor closely for signs/symptoms of post-op bleeding  F/u ability to resume Eliquis  Peggyann Juba, PharmD, BCPS Pager: [phone] 11/04/2017,6:56 AM

## 2017-11-04 NOTE — Progress Notes (Signed)
PROGRESS NOTE    Logan Bean  AYT:[id number] DOB: 03-01-1921 DOA: 10/31/2017 PCP: Lauree Chandler, NP   Brief Narrative:  Logan Andersis a 84 y.o.malewith medical history significant for HTN, PVD, venous stasis ulcers status post stenting on left SFA, A. Fib, CHF, and other comorbids who presented to the ED c/o right hip pain after mechanical fall PTA on 10/31/17. Denies hitting his head or LOC. In the ED, xray showed R hip fracture. Of note, pt was just discharged from the hospital on 10/28/17 after being treated for left leg cellulitis. Orthopedics consulted.  Pt admitted for further management. And underwent Right Hip Arthoplasty on 11/02/17.  Speech reevaluated and patient remains n.p.o. at this time.  Further course depending on palliative care discussion.  Assessment & Plan:   Principal Problem:   Hip fracture (HCC) Active Problems:   Dyslipidemia   GERD (gastroesophageal reflux disease)   CHF (congestive heart failure) (HCC)   Chronic atrial fibrillation (HCC)   Chronic anticoagulation-Eliquis   CKD (chronic kidney disease) stage 3, GFR 30-59 ml/min (HCC)   Fall   Type 2 diabetes mellitus with diabetic chronic kidney disease (HCC)   Iron deficiency anemia secondary to inadequate dietary iron intake   Cellulitis   Abnormal chest x-ray  Right hip fracture 2/2 mechanical fall S/P R hemiarthroplasty on 11/02/17 -Pain management by orthopedics; increased patient's IV fentanyl from 25 mcg every 2 H PRN to every hour as needed -Continue with Robaxin per orthopedic surgery -Bowel regimen with bisacodyl 10 mg rectal suppositories daily as needed, max 800 g p.o. twice daily, MiraLAX 17 g daily as needed for mild constipation, -PT/OT per Ortho -Likely permanent SNF/NH placement -SW on board  Chronic A. Fib with RVR -Likely due to post op/pain -Placed on diltiazem drip in PACU rate has been controlled however cannot convert to p.o. given patient's dysphagia -CHADS2-VASc score  of 5 -Started IV heparin w/o bolus as pt is currently NPO due to dysphagia -Continue to hold hold home Eliquis  Dysphagia -SLP on board: Severe aspiration risk, rec NPO for now -SLP evaluated and patient remains n.p.o. given frank aspiration risk; Recommending against MBS at this time as they suspect will diagnose severe dysphagia and high aspiration/malnutrition risk  -Dietician consulted for possible tube feeds after re-evaluation with SLP  -NPO, IV meds -Palliative Care consultation and is involved for goals of care; patient's family would like to discuss [place] and talk with palliative care medicine before any aggressive measures.  Left lower extremity MRSA cellulitis/wound infection -Improving -Wound cultures grew MRSA -Discharged home on Bactrim, completed  course on 11/03/17 -Wound care consulted -Monitor renal function closely, avoid nephrotoxic -Daily BMP  ??Pulmonary nodule/mass -CT chest showed lobulated mass within the major fissure on the RIGHT measuring 8.3 x 2.1 x 4.7cm. Likely pseudotumor related to previous pleural effusion. Less likely, pleural lipoma or loculated hematoma. Recommended continued radiographic or CT follow-up to document decreased size -Same mass was noted on CXR in 2015 (1.7cm) -PCP to follow up discharge  AKI, Improved -Creatinine 1.28 on admission, baseline WNL -Continue gentle IV fluids with normal saline at 75 mL's per hour -Held home lisinopril, avoid nephrotoxics -Strict I and O's, Daily Weights -Nursing and said that she only made around 300 mL's this entire shift.  We will continue IV fluid hydration and continue to monitor and if worsening will get renal involved.  Diabetes mellitus type 2 diabetes -A1c in January 2019, 7.4 -SSI, Accu-Cheks -Hold home Metformin -CBGs have been ranging from 115-146  Hypertension -Stable -Lasix and lisinopril on hold as patient is unable to take p.o. at this time  Microcytic Anemia -Baseline  around 11, dropped to 8.8 postoperatively and current hemoglobin hematocrit is stable at 9.4/29.9 -Likely due to iron deficiency anemia -Type and screen done, transfuse if hgb <7 -Continue iron supplements with ferrous sulfate 325 mg 3 times daily with meals when patient is able to take p.o.  DVT prophylaxis: Anticoagulated with Heparin gtt Code Status: DNR Family Communication: Discussed with patient's POA over the phone with Disposition Plan: Pending clinical course and palliative care discussion.  Anticipate discharge to SNF not being made comfortable.  Consultants:   Orthopedic Surgery Dr. Alvan Dame  Palliative Care Medicine   WOC Nurse  Procedures:  Right hip hemiarthroplasty    Antimicrobials:  Anti-infectives (From admission, onward)   Start     Dose/Rate Route Frequency Ordered Stop   11/03/17 0200  ceFAZolin (ANCEF) IVPB 2g/100 mL premix     2 g 200 mL/hr over 30 Minutes Intravenous Every 6 hours 11/02/17 2326 11/03/17 0926   11/02/17 1741  vancomycin (VANCOCIN) 1-5 GM/200ML-% IVPB    Note to Pharmacy:  Waldron Session   : cabinet override      11/02/17 1741 11/03/17 0544   11/02/17 1601  ceFAZolin (ANCEF) 2-4 GM/100ML-% IVPB    Note to Pharmacy:  Waldron Session   : cabinet override      11/02/17 1601 11/03/17 0414   11/01/17 0730  ceFAZolin (ANCEF) IVPB 2g/100 mL premix  Status:  Discontinued     2 g 200 mL/hr over 30 Minutes Intravenous On call to O.R. 11/01/17 0728 11/01/17 1113   10/31/17 2200  sulfamethoxazole-trimethoprim (BACTRIM DS,SEPTRA DS) 800-160 MG per tablet 1 tablet  Status:  Discontinued     1 tablet Oral 2 times daily 10/31/17 1233 11/03/17 1604     Subjective: Patient was seen and examined with complaining of severe leg cramps and pain in lower extremities.  Did not appears comfortable.  No nausea, no vomiting.  Speech therapy came by and patient failed SLP again today.  Discussed extensively with the healthcare power of attorney about further course  and possible core track and they would like to speak with palliative care medicine before making any decisions.  Objective: Vitals:   11/03/17 1103 11/03/17 1657 11/03/17 2035 11/04/17 0527  BP: (!) 138/51 (!) 134/58 129/70 128/70  Pulse: 77 87 77 (!) 129  Resp: (!) 22 20 20    Temp:  98.8 F (37.1 C) 99 F (37.2 C) 98 F (36.7 C)  TempSrc:  Oral Oral Oral  SpO2: 98% 97% 98% 98%  Weight:      Height:        Intake/Output Summary (Last 24 hours) at 11/04/2017 0748 Last data filed at 11/04/2017 0600 Gross per 24 hour  Intake 122.46 ml  Output 600 ml  Net -477.54 ml   Filed Weights   10/31/17 0922 11/02/17 1720 11/02/17 2306  Weight: 56.7 kg (125 lb) 56.7 kg (125 lb) 66.6 kg (146 lb 13.2 oz)   Examination: Physical Exam:  Constitutional: Thin elderly appearing appears calm but uncomfortable Eyes: Lids and conjunctivae normal, sclerae anicteric  ENMT: External Ears, Nose appear normal. Neck: Appears normal, supple, no cervical masses, normal ROM, no appreciable thyromegaly; no JVD Respiratory: Diminished to auscultation bilaterally, no wheezing, rales, rhonchi or crackles. Normal respiratory effort and patient is not tachypenic. No accessory muscle use.  Cardiovascular: Irregularly Irregular, no murmurs / rubs / gallops. S1 and  S2 auscultated. Trace LE edema Abdomen: Soft, non-tender, non-distended. No masses palpated. No appreciable hepatosplenomegaly. Bowel sounds positive x4.  GU: Deferred. Musculoskeletal: No clubbing / cyanosis of digits/nails. No joint deformity upper and lower extremities.   Skin: Has LE Venous stasis ulcers. No induration; Warm and dry.  Neurologic: CN 2-12 grossly intact with no focal deficits.  Romberg sign and cerebellar reflexes not assessed.  Psychiatric: Impaired judgment and insight. Alert and awake. Normal mood and appropriate affect.   Data Reviewed: I have personally reviewed following labs and imaging studies  CBC: Recent Labs  Lab  10/31/17 1007 11/01/17 1329 11/02/17 0414 11/03/17 0339 11/04/17 0535  WBC 15.3* 11.2* 10.1 10.3 9.0  NEUTROABS 13.7*  --  8.4* 8.7* 7.1  HGB 9.2* 9.1* 8.8* 9.9* 9.4*  HCT 30.7* 29.6* 28.6* 31.1* 29.9*  MCV 76.8* 77.3* 76.7* 76.4* 78.5  PLT 305 286 296 268 696   Basic Metabolic Panel: Recent Labs  Lab 11/01/17 1329 11/02/17 0414 11/03/17 0339 11/03/17 0751 11/04/17 0535  NA 136 136 137 137 137  K 4.7 4.8 4.8 5.2* 4.6  CL 107 108 109 110 110  CO2 20* 18* 18* 18* 19*  GLUCOSE 196* 168* 198* 197* 131*  BUN 32* 30* 29* 31* 30*  CREATININE 0.90 0.79 0.99 1.02 0.90  CALCIUM 9.1 8.9 8.4* 8.5* 8.4*   GFR: Estimated Creatinine Clearance (by C-G formula based on SCr of 0.9 mg/dL) Male: 34.3 mL/min Male: 39.5 mL/min Liver Function Tests: Recent Labs  Lab 10/31/17 1007  AST 25  ALT 27  ALKPHOS 88  BILITOT 0.9  PROT 6.1*  ALBUMIN 3.0*   No results for input(s): LIPASE, AMYLASE in the last 168 hours. No results for input(s): AMMONIA in the last 168 hours. Coagulation Profile: Recent Labs  Lab 10/31/17 1007  INR 1.33   Cardiac Enzymes: No results for input(s): CKTOTAL, CKMB, CKMBINDEX, TROPONINI in the last 168 hours. BNP (last 3 results) No results for input(s): PROBNP in the last 8760 hours. HbA1C: No results for input(s): HGBA1C in the last 72 hours. CBG: Recent Labs  Lab 11/03/17 0733 11/03/17 1207 11/03/17 1658 11/03/17 2036 11/04/17 0737  GLUCAP 207* 178* 147* 123* 126*   Lipid Profile: No results for input(s): CHOL, HDL, LDLCALC, TRIG, CHOLHDL, LDLDIRECT in the last 72 hours. Thyroid Function Tests: No results for input(s): TSH, T4TOTAL, FREET4, T3FREE, THYROIDAB in the last 72 hours. Anemia Panel: Recent Labs    11/02/17 0414  FERRITIN 41  TIBC 353  IRON 13*   Sepsis Labs: No results for input(s): PROCALCITON, LATICACIDVEN in the last 168 hours.  Recent Results (from the past 240 hour(s))  Blood culture (routine x 2)     Status: None    Collection Time: 10/26/17  4:10 AM  Result Value Ref Range Status   Specimen Description   Final    BLOOD RIGHT FOREARM Performed at [facility] [address] [place], [place] [address]    Special Requests   Final    BOTTLES DRAWN AEROBIC ONLY Blood Culture adequate volume Performed at [facility] [address], [place], [place] [address]    Culture   Final    NO GROWTH 5 DAYS Performed at [facility], [place] [address] [place], [place] [address]    Report Status 10/31/2017 FINAL  Final  Blood culture (routine x 2)     Status: None   Collection Time: 10/26/17  4:10 AM  Result Value Ref Range Status  Specimen Description   Final    BLOOD RIGHT ANTECUBITAL Performed at [facility] [address], [place], [place] [address]    Special Requests   Final    BOTTLES DRAWN AEROBIC ONLY Blood Culture adequate volume Performed at [facility] [address] [place], [place] [address]    Culture   Final    NO GROWTH 5 DAYS Performed at [facility], [place] [address] [place], [place] [address]    Report Status 10/31/2017 FINAL  Final  Aerobic Culture (superficial specimen)     Status: None   Collection Time: 10/26/17  9:40 AM  Result Value Ref Range Status   Specimen Description   Final    WOUND LEFT LEG Performed at [facility] [address], [place], [place] [address]    Special Requests   Final    Normal Performed at [facility], [place] [address] [place], [place] [address]    Gram Stain   Final    NO WBC SEEN NO ORGANISMS SEEN Performed at [facility], [place] [address], [place], [place] [address]    Culture   Final    MODERATE METHICILLIN RESISTANT STAPHYLOCOCCUS AUREUS   Report Status 10/28/2017 FINAL  Final   Organism ID, Bacteria METHICILLIN RESISTANT STAPHYLOCOCCUS AUREUS  Final      Susceptibility   Methicillin resistant  staphylococcus aureus - MIC*    CIPROFLOXACIN >=8 RESISTANT Resistant     ERYTHROMYCIN >=8 RESISTANT Resistant     GENTAMICIN <=0.5 SENSITIVE Sensitive     OXACILLIN >=4 RESISTANT Resistant     TETRACYCLINE >=16 RESISTANT Resistant     VANCOMYCIN 1 SENSITIVE Sensitive     TRIMETH/SULFA <=10 SENSITIVE Sensitive     CLINDAMYCIN <=0.25 SENSITIVE Sensitive     RIFAMPIN <=0.5 SENSITIVE Sensitive     Inducible Clindamycin NEGATIVE Sensitive     * MODERATE METHICILLIN RESISTANT STAPHYLOCOCCUS AUREUS  MRSA PCR Screening     Status: None   Collection Time: 11/01/17 12:21 AM  Result Value Ref Range Status   MRSA by PCR NEGATIVE NEGATIVE Final    Comment:        The GeneXpert MRSA Assay (FDA approved for NASAL specimens only), is one component of a comprehensive MRSA colonization surveillance program. It is not intended to diagnose MRSA infection nor to guide or monitor treatment for MRSA infections. Performed at [facility], [place] [address] [place], [place] [address]     Radiology Studies: Pelvis Portable  Result Date: 11/02/2017 CLINICAL DATA:  Right hemiarthroplasty EXAM: PORTABLE PELVIS 1-2 VIEWS COMPARISON:  None. FINDINGS: Uncemented right bipolar hip arthroplasty without immediate postoperative hardware failure nor periprosthetic fracture. No joint dislocation. The native left hip demonstrates degenerative subchondral cystic change of the femoral head with loss of sphericity and mild osteoarthritic joint space narrowing. Lower lumbar degenerative disc and facet arthropathy seen from L4 through S1. Pelvis appears slightly demineralized without for acute fracture. Common femoral arteriosclerosis is noted. IMPRESSION: 1. No immediate complications status post right uncemented bipolar hip arthroplasty. 2. Osteoarthritis of the native left hip. 3. Lower lumbar degenerative disc and facet arthropathy. Electronically Signed   By: Ashley Royalty M.D.   On: 11/02/2017 22:36    Scheduled Meds: . calcium-vitamin D  1 tablet Oral BID WC  . docusate sodium  100 mg Oral BID  . fentaNYL (SUBLIMAZE) injection  50-100 mcg Intravenous UD  . ferrous sulfate  325 mg Oral  TID PC  . insulin aspart  0-15 Units Subcutaneous TID WC  . insulin aspart  0-5 Units Subcutaneous QHS  . lactobacillus  1 g Oral TID WC  . lactose free nutrition  237 mL Oral BID BM  . loratadine  10 mg Oral Daily  . metoprolol tartrate  25 mg Oral BID  . multivitamin with minerals  1 tablet Oral Daily  . simvastatin  10 mg Oral QHS   Continuous Infusions: . diltiazem (CARDIZEM) infusion 5 mg/hr (11/04/17 0148)  . heparin 900 Units/hr (11/03/17 1123)  . lactated ringers    . methocarbamol (ROBAXIN)  IV Stopped (11/03/17 1229)    LOS: 4 days    Kerney Elbe, DO [facility] Pager [phone]  If 7PM-7AM, please contact night-coverage [email or web address] Password [facility] 11/04/2017, 7:48 AM

## 2017-11-05 DIAGNOSIS — M25559 Pain in unspecified hip: Secondary | ICD-10-CM

## 2017-11-05 LAB — CBC WITH DIFFERENTIAL/PLATELET
BASOS ABS: 0.1 10*3/uL (ref 0.0–0.1)
BASOS PCT: 1 %
Eosinophils Absolute: 0.2 10*3/uL (ref 0.0–0.7)
Eosinophils Relative: 2 %
HEMATOCRIT: 33.3 % — AB (ref 39.0–52.0)
Hemoglobin: 10 g/dL — ABNORMAL LOW (ref 13.0–17.0)
Lymphocytes Relative: 8 %
Lymphs Abs: 0.8 10*3/uL (ref 0.7–4.0)
MCH: 23.9 pg — ABNORMAL LOW (ref 26.0–34.0)
MCHC: 30 g/dL (ref 30.0–36.0)
MCV: 79.7 fL (ref 78.0–100.0)
MONO ABS: 1 10*3/uL (ref 0.1–1.0)
Monocytes Relative: 11 %
NEUTROS ABS: 7.3 10*3/uL (ref 1.7–7.7)
NEUTROS PCT: 78 %
Platelets: 325 10*3/uL (ref 150–400)
RBC: 4.18 MIL/uL — ABNORMAL LOW (ref 4.22–5.81)
RDW: 18.7 % — ABNORMAL HIGH (ref 11.5–15.5)
WBC: 9.4 10*3/uL (ref 4.0–10.5)

## 2017-11-05 LAB — GLUCOSE, CAPILLARY
GLUCOSE-CAPILLARY: 133 mg/dL — AB (ref 65–99)
Glucose-Capillary: 113 mg/dL — ABNORMAL HIGH (ref 65–99)
Glucose-Capillary: 109 mg/dL — ABNORMAL HIGH (ref 65–99)
Glucose-Capillary: 138 mg/dL — ABNORMAL HIGH (ref 65–99)

## 2017-11-05 LAB — HEPARIN LEVEL (UNFRACTIONATED)
HEPARIN UNFRACTIONATED: 0.27 [IU]/mL — AB (ref 0.30–0.70)
HEPARIN UNFRACTIONATED: 0.34 [IU]/mL (ref 0.30–0.70)

## 2017-11-05 LAB — COMPREHENSIVE METABOLIC PANEL
ALBUMIN: 2.5 g/dL — AB (ref 3.5–5.0)
ALK PHOS: 71 U/L (ref 38–126)
ALT: 16 U/L — ABNORMAL LOW (ref 17–63)
AST: 19 U/L (ref 15–41)
Anion gap: 12 (ref 5–15)
BILIRUBIN TOTAL: 1.3 mg/dL — AB (ref 0.3–1.2)
BUN: 31 mg/dL — ABNORMAL HIGH (ref 6–20)
CALCIUM: 8.5 mg/dL — AB (ref 8.9–10.3)
CO2: 14 mmol/L — AB (ref 22–32)
Chloride: 109 mmol/L (ref 101–111)
Creatinine, Ser: 0.78 mg/dL (ref 0.61–1.24)
GFR calc non Af Amer: >60 mL/min (ref >60)
Glucose, Bld: 135 mg/dL — ABNORMAL HIGH (ref 65–99)
POTASSIUM: 4.6 mmol/L (ref 3.5–5.1)
SODIUM: 135 mmol/L (ref 135–145)
TOTAL PROTEIN: 5.5 g/dL — AB (ref 6.5–8.1)

## 2017-11-05 LAB — PHOSPHORUS: PHOSPHORUS: 2.5 mg/dL (ref 2.5–4.6)

## 2017-11-05 LAB — MAGNESIUM: Magnesium: 1.9 mg/dL (ref 1.7–2.4)

## 2017-11-05 MED ORDER — HEPARIN (PORCINE) IN NACL 100-0.45 UNIT/ML-% IJ SOLN
1000.0000 [IU]/h | INTRAMUSCULAR | Status: DC
  Administered 2017-11-05: 1000 [IU]/h via INTRAVENOUS
  Filled 2017-11-05: qty 250

## 2017-11-05 MED ORDER — SODIUM BICARBONATE 8.4 % IV SOLN
INTRAVENOUS | Status: AC
  Administered 2017-11-05 – 2017-11-06 (×2): via INTRAVENOUS
  Filled 2017-11-05 (×2): qty 150

## 2017-11-05 MED ORDER — HYDROMORPHONE HCL 1 MG/ML IJ SOLN
0.5000 mg | INTRAMUSCULAR | Status: DC | PRN
  Administered 2017-11-05: 1 mg via INTRAVENOUS
  Administered 2017-11-05: 0.5 mg via INTRAVENOUS
  Administered 2017-11-06 – 2017-11-08 (×14): 1 mg via INTRAVENOUS
  Filled 2017-11-05 (×17): qty 1

## 2017-11-05 MED ORDER — FENTANYL 25 MCG/HR TD PT72
25.0000 ug | MEDICATED_PATCH | TRANSDERMAL | Status: DC
  Administered 2017-11-05 – 2017-11-08 (×2): 25 ug via TRANSDERMAL
  Filled 2017-11-05 (×2): qty 1

## 2017-11-05 NOTE — Progress Notes (Signed)
Nutrition Brief Note  RD consulted for possible tube feeding.  Palliative to see pt/family today for goals of care meeting. If tube feeding warranted and feeding tube placed, please re-consult clinical nutrition.   Mariana Single RD, LDN Clinical Nutrition Pager # [phone]

## 2017-11-05 NOTE — Plan of Care (Signed)
  Problem: Clinical Measurements: Goal: Ability to maintain clinical measurements within normal limits will improve Outcome: Progressing Note:  HR stable on card gtt.    Problem: Elimination: Goal: Will not experience complications related to bowel motility Outcome: Progressing   Problem: Pain Managment: Goal: General experience of comfort will improve Outcome: Progressing Note:  IV Fentanyl q 1hr.    Problem: Safety: Goal: Ability to remain free from injury will improve Outcome: Progressing   Problem: Skin Integrity: Goal: Risk for impaired skin integrity will decrease Outcome: Progressing

## 2017-11-05 NOTE — Progress Notes (Signed)
ANTICOAGULATION CONSULT NOTE - Follow Up Consult  Pharmacy Consult for Heparin Indication: atrial fibrillation  Allergies  Allergen Reactions  . Morphine And Related Nausea And Vomiting  . Oxycodone Nausea And Vomiting  . Plavix [Clopidogrel] Rash    Rash on upper body    Patient Measurements: Height: 5\' 3"  (160 cm) Weight: 146 lb 13.2 oz (66.6 kg) IBW/kg (Calculated) : 56.9 Heparin Dosing Weight: actual weight  Vital Signs: Temp: 97.7 F (36.5 C) (04/25 0428) Temp Source: Oral (04/25 0428) BP: 144/84 (04/25 0428) Pulse Rate: 84 (04/25 0428)  Labs: Recent Labs    11/03/17 0339 11/03/17 0751 11/03/17 2133 11/04/17 0535 11/05/17 0454  HGB 9.9*  --   --  9.4* 10.0*  HCT 31.1*  --   --  29.9* 33.3*  PLT 268  --   --  286 325  HEPARINUNFRC  --   --  0.48 0.36 0.27*  CREATININE 0.99 1.02  --  0.90  --     Estimated Creatinine Clearance (by C-G formula based on SCr of 0.9 mg/dL) Male: 34.3 mL/min Male: 39.5 mL/min  Assessment: 84 yo male admitted s/p fall on 4/20 sustaining a right hip fracture, s/p hemiarthroplasty 4/22 PM.  Patient is on chronic Eliquis for afib, last dose taken 4/20 at 0700.  Postoperatively, ordered to resume Eliquis but patient failed swallow evaluation 4/23, therefore, changing therapy to IV heparin, Pharmacy to dose.  Today, 11/05/2017  Heparin level is slightly subtherapeutic (0.27) on 900 units/hr - RN reports IV infusing well with no issues  Hgb today =10 s/p transfusion 4/22 post-op  No bleeding reported  Goal of Therapy:  Heparin level 0.3-0.7 units/ml Monitor platelets by anticoagulation protocol: Yes   Plan:   increase Heparin IV infusion to 1000 units/hr and check 8 hr HL  Daily heparin level and CBC  Monitor closely for signs/symptoms of post-op bleeding  F/u ability to resume Eliquis  Eudelia Bunch, Pharm.D. [phone] 11/05/2017 5:49 AM

## 2017-11-05 NOTE — Progress Notes (Signed)
PROGRESS NOTE    Logan Bean  NGE:[id number] DOB: [birth year] DOA: 10/31/2017 PCP: Lauree Chandler, NP   Brief Narrative:  Logan Andersis a 84 y.o.malewith medical history significant for HTN, PVD, venous stasis ulcers status post stenting on left SFA, A. Fib, CHF, and other comorbids who presented to the ED c/o right hip pain after mechanical fall PTA on 10/31/17. Denies hitting his head or LOC. In the ED, xray showed R hip fracture. Of note, pt was just discharged from the hospital on 10/28/17 after being treated for left leg cellulitis. Orthopedics consulted.  Pt admitted for further management. And underwent Right Hip Arthoplasty on 11/02/17.  Speech reevaluated yesterday and patient remains n.p.o. at this time.  Further course depending on palliative care discussion and Dr. Domingo Cocking of Palliative to meet with patient and family today.   Assessment & Plan:   Principal Problem:   Hip fracture (HCC) Active Problems:   Dyslipidemia   GERD (gastroesophageal reflux disease)   CHF (congestive heart failure) (HCC)   Chronic atrial fibrillation (HCC)   Chronic anticoagulation-Eliquis   CKD (chronic kidney disease) stage 3, GFR 30-59 ml/min (HCC)   Fall   Type 2 diabetes mellitus with diabetic chronic kidney disease (HCC)   Iron deficiency anemia secondary to inadequate dietary iron intake   Cellulitis   Abnormal chest x-ray  Right hip fracture 2/2 mechanical fall S/P R hemiarthroplasty on 11/02/17 -Pain management by orthopedics; Increased patient's IV fentanyl from 25 mcg every 2 H PRN to every hour as needed and patient appeared more comfortable today  -Continue with Robaxin per orthopedic surgery -Bowel regimen with bisacodyl 10 mg rectal suppositories daily as needed, max 800 g p.o. twice daily, MiraLAX 17 g daily as needed for mild constipation, -PT/OT per Ortho -Likely permanent SNF/NH placement -SW on board and is to follow-up with the patient and family after palliative  meeting to discuss discharge planning.  Chronic A. Fib with RVR -Likely due to post op/pain -Placed on Diltiazem drip in PACU rate has been controlled however cannot convert to p.o. given patient's dysphagia so will continue diltiazem drip currently. -CHADS2-VASc score of 5 -Started IV heparin w/o bolus as pt is currently NPO due to dysphagia; continue heparin drip for now -Continue to hold hold home Eliquis  Dysphagia -SLP on board: Severe aspiration risk, rec NPO for now -SLP evaluated and patient remains n.p.o. given frank aspiration risk; Recommending against MBS at this time as they suspect will diagnose severe dysphagia and high aspiration/malnutrition risk  -Dietician consulted for possible tube feeds after re-evaluation with SLP  -NPO, IV meds -Palliative Care consultation and is involved for goals of care; patient's family would like to discuss Cortrak and talk with palliative care medicine before any aggressive measures. Palliative care discussion to be done today.  Left lower extremity MRSA cellulitis/wound infection -Improving -Wound cultures grew MRSA -Discharged home on Bactrim, completed  course on 11/03/17 -Wound care consulted -Monitor renal function closely, avoid nephrotoxic -Repeat CMP  ??Pulmonary nodule/mass -CT chest showed lobulated mass within the major fissure on the RIGHT measuring 8.3 x 2.1 x 4.7cm. Likely pseudotumor related to previous pleural effusion. Less likely, pleural lipoma or loculated hematoma. Recommended continued radiographic or CT follow-up to document decreased size -Same mass was noted on CXR in 2015 (1.7cm) -PCP to follow up discharge  AKI, Improved -Creatinine 1.28 on admission, baseline WNL -Continue gentle IV fluids with normal saline at 75 mL's per hour -BUN/creatinine is now 31/0.78 -Held home lisinopril, avoid nephrotoxics -  Strict I and O's, Daily Weights; patient is +3.052 L since admission -Nursing and said that she only  made around 300 mL's this entire shift yesterday however UOP has improved  Diabetes mellitus type 2 diabetes -A1c in January 2019, 7.4 -SSI, Accu-Cheks -Hold home Metformin -CBGs have been ranging from 113-146  Hypertension -Stable -Lasix and Lisinopril on hold as patient is unable to take p.o. at this time  Microcytic Anemia -Baseline around 11, dropped to 8.8 postoperatively and current hemoglobin hematocrit is stable at 9.4/29.9 -Likely due to iron deficiency anemia -Type and screen done, transfuse if hgb <7 -Continue iron supplements with ferrous sulfate 325 mg 3 times daily with meals when patient is able to take p.o. -Patient's hemoglobin/hematocrit is now 10.0/33.3  Hyperbilirubinemia -Patient's T bili was 1.3 this a.m. -Continue to monitor and repeat CMP in a.m.  Non-gap Metabolic Acidosis -Patient's CO2 this AM was 14 and AG was 12 -Continue to Monitor Closely and C/w IVF Rehydration for now and will change to a Bicarbonate gtt.   DVT prophylaxis: Anticoagulated with Heparin gtt Code Status: DNR Family Communication: No family present at bedside  Disposition Plan: Pending clinical course and Palliative Care Discussion.  Anticipate discharge to SNF if not being made Comfort Care  Consultants:   Orthopedic Surgery Dr. Alvan Dame  Palliative Care Medicine   WOC Nurse  Procedures:  Right hip hemiarthroplasty    Antimicrobials:  Anti-infectives (From admission, onward)   Start     Dose/Rate Route Frequency Ordered Stop   11/03/17 0200  ceFAZolin (ANCEF) IVPB 2g/100 mL premix     2 g 200 mL/hr over 30 Minutes Intravenous Every 6 hours 11/02/17 2326 11/03/17 0926   11/02/17 1741  vancomycin (VANCOCIN) 1-5 GM/200ML-% IVPB    Note to Pharmacy:  Waldron Session   : cabinet override      11/02/17 1741 11/03/17 0544   11/02/17 1601  ceFAZolin (ANCEF) 2-4 GM/100ML-% IVPB    Note to Pharmacy:  Waldron Session   : cabinet override      11/02/17 1601 11/03/17 0414    11/01/17 0730  ceFAZolin (ANCEF) IVPB 2g/100 mL premix  Status:  Discontinued     2 g 200 mL/hr over 30 Minutes Intravenous On call to O.R. 11/01/17 0728 11/01/17 1113   10/31/17 2200  sulfamethoxazole-trimethoprim (BACTRIM DS,SEPTRA DS) 800-160 MG per tablet 1 tablet  Status:  Discontinued     1 tablet Oral 2 times daily 10/31/17 1233 11/03/17 1604     Subjective: Patient was seen and examined this a.m. and was more comfortable than yesterday.  Pain seem to be a little bit better controlled however throughout the day patient had to require several pain of medications.  Patient still remains n.p.o. at this time. Palliative Care to meet with patient and the patient's healthcare power of attorney for family discussion today.  Objective: Vitals:   11/04/17 2045 11/05/17 0028 11/05/17 0428 11/05/17 0941  BP: (!) 146/66 (!) 149/57 (!) 144/84 132/74  Pulse: (!) 111 96 84 (!) 101  Resp: 20 20 20 18   Temp: (!) 97.4 F (36.3 C) 98.7 F (37.1 C) 97.7 F (36.5 C) (!) 97.5 F (36.4 C)  TempSrc: Oral Oral Oral Oral  SpO2: 98% 95% 95% 94%  Weight:      Height:        Intake/Output Summary (Last 24 hours) at 11/05/2017 1508 Last data filed at 11/05/2017 1429 Gross per 24 hour  Intake 1439.58 ml  Output 575 ml  Net 864.58  ml   Filed Weights   10/31/17 0922 11/02/17 1720 11/02/17 2306  Weight: 56.7 kg (125 lb) 56.7 kg (125 lb) 66.6 kg (146 lb 13.2 oz)   Examination: Physical Exam:  Constitutional: Thin transgendered male appearing calm and in no acute distress; Still appears uncomfortable but not as much as yesterday. Eyes: Lids and conjunctive are normal.  Sclerae anicteric ENMT: External ears and nose appear normal.  Slightly hard of hearing Neck: Supple with no appreciable JVD Respiratory: Diminished to auscultation bilaterally with no appreciable wheezing, rales, rhonchi.  Normal respiratory effort the patient is not tachypneic however is using supplemental oxygen by nasal  cannula Cardiovascular: Irregularly irregular.  No appreciable murmurs rubs or gallops.  Trace lower extremity edema Abdomen: Soft, nontender, nondistended.  Bowel sounds present all 4 quadrants GU: Deferred.  Has a indwelling Foley catheter in place Musculoskeletal: No contractures, no cyanosis. Skin: Has lower extremity venous stasis ulcers.  Skin is warm and dry.  Has some erythema in the right arm Neurologic: Cranial nerves II through XII are grossly intact with no appreciable focal deficits. Psychiatric: Impaired judgment and insight.  Patient is awake alert.  Frustrated mood and affect.  Data Reviewed: I have personally reviewed following labs and imaging studies  CBC: Recent Labs  Lab 10/31/17 1007 11/01/17 1329 11/02/17 0414 11/03/17 0339 11/04/17 0535 11/05/17 0454  WBC 15.3* 11.2* 10.1 10.3 9.0 9.4  NEUTROABS 13.7*  --  8.4* 8.7* 7.1 7.3  HGB 9.2* 9.1* 8.8* 9.9* 9.4* 10.0*  HCT 30.7* 29.6* 28.6* 31.1* 29.9* 33.3*  MCV 76.8* 77.3* 76.7* 76.4* 78.5 79.7  PLT 305 286 296 268 286 253   Basic Metabolic Panel: Recent Labs  Lab 11/02/17 0414 11/03/17 0339 11/03/17 0751 11/04/17 0535 11/05/17 0454  NA 136 137 137 137 135  K 4.8 4.8 5.2* 4.6 4.6  CL 108 109 110 110 109  CO2 18* 18* 18* 19* 14*  GLUCOSE 168* 198* 197* 131* 135*  BUN 30* 29* 31* 30* 31*  CREATININE 0.79 0.99 1.02 0.90 0.78  CALCIUM 8.9 8.4* 8.5* 8.4* 8.5*  MG  --   --   --   --  1.9  PHOS  --   --   --   --  2.5   GFR: Estimated Creatinine Clearance (by C-G formula based on SCr of 0.78 mg/dL) Male: 38.6 mL/min Male: 44.5 mL/min Liver Function Tests: Recent Labs  Lab 10/31/17 1007 11/05/17 0454  AST 25 19  ALT 27 16*  ALKPHOS 88 71  BILITOT 0.9 1.3*  PROT 6.1* 5.5*  ALBUMIN 3.0* 2.5*   No results for input(s): LIPASE, AMYLASE in the last 168 hours. No results for input(s): AMMONIA in the last 168 hours. Coagulation Profile: Recent Labs  Lab 10/31/17 1007  INR 1.33   Cardiac  Enzymes: No results for input(s): CKTOTAL, CKMB, CKMBINDEX, TROPONINI in the last 168 hours. BNP (last 3 results) No results for input(s): PROBNP in the last 8760 hours. HbA1C: No results for input(s): HGBA1C in the last 72 hours. CBG: Recent Labs  Lab 11/04/17 1147 11/04/17 1645 11/04/17 2038 11/05/17 0747 11/05/17 1140  GLUCAP 115* 146* 108* 133* 113*   Lipid Profile: No results for input(s): CHOL, HDL, LDLCALC, TRIG, CHOLHDL, LDLDIRECT in the last 72 hours. Thyroid Function Tests: No results for input(s): TSH, T4TOTAL, FREET4, T3FREE, THYROIDAB in the last 72 hours. Anemia Panel: No results for input(s): VITAMINB12, FOLATE, FERRITIN, TIBC, IRON, RETICCTPCT in the last 72 hours. Sepsis Labs: No results for  input(s): PROCALCITON, LATICACIDVEN in the last 168 hours.  Recent Results (from the past 240 hour(s))  MRSA PCR Screening     Status: None   Collection Time: 11/01/17 12:21 AM  Result Value Ref Range Status   MRSA by PCR NEGATIVE NEGATIVE Final    Comment:        The GeneXpert MRSA Assay (FDA approved for NASAL specimens only), is one component of a comprehensive MRSA colonization surveillance program. It is not intended to diagnose MRSA infection nor to guide or monitor treatment for MRSA infections. Performed at [facility], [place] [address], [place], [place] [address]   Urine culture     Status: None   Collection Time: 11/03/17 12:41 AM  Result Value Ref Range Status   Specimen Description   Final    URINE, CATHETERIZED Performed at [facility] [address], [place], [place] [address]    Special Requests NONE  Final   Culture   Final    NO GROWTH Performed at [facility], [place] [address] [place],  [address]    Report Status 11/04/2017 FINAL  Final    Radiology Studies: No results found. Scheduled Meds: . calcium-vitamin D  1 tablet Oral BID WC  . fentaNYL (SUBLIMAZE) injection  50-100 mcg  Intravenous UD  . insulin aspart  0-15 Units Subcutaneous TID WC  . insulin aspart  0-5 Units Subcutaneous QHS   Continuous Infusions: . sodium chloride 75 mL/hr at 11/05/17 1422  . diltiazem (CARDIZEM) infusion 5 mg/hr (11/05/17 1429)  . heparin 1,000 Units/hr (11/05/17 7371)  . lactated ringers    . methocarbamol (ROBAXIN)  IV Stopped (11/05/17 0251)    LOS: 5 days    Kerney Elbe, DO [facility] Pager [phone]  If 7PM-7AM, please contact night-coverage [email or web address] Password [facility] 11/05/2017, 3:08 PM

## 2017-11-05 NOTE — Progress Notes (Signed)
PT Cancellation Note  Patient Details Name: Logan Bean MRN: [id number] DOB: 1921-06-20   Cancelled Treatment:    Reason Eval/Treat Not Completed: Other (comment)Palliative  Medicine to be consulted. Will check back another time.   Claretha Cooper 11/05/2017, 7:39 AM Tresa Endo PT [phone]

## 2017-11-05 NOTE — Progress Notes (Signed)
CSW acknowledged consult for SNF placement. Per patient's RN patient has a meeting scheduled with palliative today 4/25. CSW will follow up with patient/patient's family after palliative meeting to discuss discharge planning. CSW will continue to follow and assist with discharge planning.  Abundio Miu, [place] Social Worker [facility] Cell#: [phone]

## 2017-11-05 NOTE — Progress Notes (Signed)
ANTICOAGULATION CONSULT NOTE - Follow Up Consult  Pharmacy Consult for Heparin Indication: atrial fibrillation  Allergies  Allergen Reactions  . Morphine And Related Nausea And Vomiting  . Oxycodone Nausea And Vomiting  . Plavix [Clopidogrel] Rash    Rash on upper body    Patient Measurements: Height: 5\' 3"  (160 cm) Weight: 146 lb 13.2 oz (66.6 kg) IBW/kg (Calculated) : 56.9 Heparin Dosing Weight: actual weight  Vital Signs: Temp: 97.5 F (36.4 C) (04/25 0941) Temp Source: Oral (04/25 0941) BP: 132/74 (04/25 0941) Pulse Rate: 101 (04/25 0941)  Labs: Recent Labs    11/03/17 0339 11/03/17 0751  11/04/17 0535 11/05/17 0454 11/05/17 1409  HGB 9.9*  --   --  9.4* 10.0*  --   HCT 31.1*  --   --  29.9* 33.3*  --   PLT 268  --   --  286 325  --   HEPARINUNFRC  --   --    < > 0.36 0.27* 0.34  CREATININE 0.99 1.02  --  0.90 0.78  --    < > = values in this interval not displayed.    Estimated Creatinine Clearance (by C-G formula based on SCr of 0.78 mg/dL) Logan Bean: 38.6 mL/min Logan Bean: 44.5 mL/min  Assessment: 84 yo Logan Bean admitted s/p fall on 4/20 sustaining a right hip fracture, s/p hemiarthroplasty 4/22 PM.  Patient is on chronic Eliquis for afib, last dose taken 4/20 at 0700.  Postoperatively, ordered to resume Eliquis but patient failed swallow evaluation 4/23, therefore, changing therapy to IV heparin, Pharmacy to dose.  Today, 11/05/2017  Heparin level therapeutic (0.27) after rate increased to 1000 units/hr this morning  Hgb today =10 s/p transfusion 4/22 post-op, Plts wnl  No bleeding or complications reported  Goal of Therapy:  Heparin level 0.3-0.7 units/ml Monitor platelets by anticoagulation protocol: Yes   Plan:   Continue Heparin IV infusion 1000 units/hr   Daily heparin level and CBC  Monitor closely for signs/symptoms of post-op bleeding  F/u plans for long-term anticoagulation pending goals of care discussion  Peggyann Juba, PharmD,  BCPS Pager: [phone] 11/05/2017 3:18 PM

## 2017-11-06 DIAGNOSIS — S72001A Fracture of unspecified part of neck of right femur, initial encounter for closed fracture: Secondary | ICD-10-CM

## 2017-11-06 DIAGNOSIS — K219 Gastro-esophageal reflux disease without esophagitis: Secondary | ICD-10-CM

## 2017-11-06 DIAGNOSIS — Z7189 Other specified counseling: Secondary | ICD-10-CM

## 2017-11-06 DIAGNOSIS — R1310 Dysphagia, unspecified: Secondary | ICD-10-CM

## 2017-11-06 DIAGNOSIS — Z515 Encounter for palliative care: Secondary | ICD-10-CM

## 2017-11-06 LAB — TYPE AND SCREEN
ABO/RH(D): O POS
ANTIBODY SCREEN: NEGATIVE
UNIT DIVISION: 0
Unit division: 0
Unit division: 0
Unit division: 0

## 2017-11-06 LAB — BPAM RBC
BLOOD PRODUCT EXPIRATION DATE: 201905172359
Blood Product Expiration Date: 201905172359
Blood Product Expiration Date: 201905172359
Blood Product Expiration Date: 201905172359
ISSUE DATE / TIME: 201904221711
ISSUE DATE / TIME: 201904221903
ISSUE DATE / TIME: 201904221711
ISSUE DATE / TIME: 201904221903
UNIT TYPE AND RH: 5100
UNIT TYPE AND RH: 5100
Unit Type and Rh: 5100
Unit Type and Rh: 5100

## 2017-11-06 LAB — CBC WITH DIFFERENTIAL/PLATELET
Basophils Absolute: 0.1 10*3/uL (ref 0.0–0.1)
Basophils Relative: 1 %
EOS PCT: 4 %
Eosinophils Absolute: 0.3 10*3/uL (ref 0.0–0.7)
HEMATOCRIT: 30.5 % — AB (ref 39.0–52.0)
Hemoglobin: 9.3 g/dL — ABNORMAL LOW (ref 13.0–17.0)
LYMPHS ABS: 0.6 10*3/uL — AB (ref 0.7–4.0)
LYMPHS PCT: 8 %
MCH: 24.1 pg — AB (ref 26.0–34.0)
MCHC: 30.5 g/dL (ref 30.0–36.0)
MCV: 79 fL (ref 78.0–100.0)
MONO ABS: 0.8 10*3/uL (ref 0.1–1.0)
Monocytes Relative: 9 %
NEUTROS ABS: 6.3 10*3/uL (ref 1.7–7.7)
Neutrophils Relative %: 78 %
PLATELETS: 307 10*3/uL (ref 150–400)
RBC: 3.86 MIL/uL — AB (ref 4.22–5.81)
RDW: 18.6 % — ABNORMAL HIGH (ref 11.5–15.5)
WBC: 8.2 10*3/uL (ref 4.0–10.5)

## 2017-11-06 LAB — GLUCOSE, CAPILLARY
GLUCOSE-CAPILLARY: 131 mg/dL — AB (ref 65–99)
GLUCOSE-CAPILLARY: 136 mg/dL — AB (ref 65–99)
GLUCOSE-CAPILLARY: 138 mg/dL — AB (ref 65–99)
Glucose-Capillary: 124 mg/dL — ABNORMAL HIGH (ref 65–99)

## 2017-11-06 LAB — COMPREHENSIVE METABOLIC PANEL
ALT: 15 U/L — ABNORMAL LOW (ref 17–63)
AST: 16 U/L (ref 15–41)
Albumin: 2.2 g/dL — ABNORMAL LOW (ref 3.5–5.0)
Alkaline Phosphatase: 65 U/L (ref 38–126)
Anion gap: 9 (ref 5–15)
BUN: 26 mg/dL — AB (ref 6–20)
CALCIUM: 8.4 mg/dL — AB (ref 8.9–10.3)
CO2: 21 mmol/L — ABNORMAL LOW (ref 22–32)
CREATININE: 0.73 mg/dL (ref 0.61–1.24)
Chloride: 110 mmol/L (ref 101–111)
GFR calc Af Amer: >60 mL/min (ref >60)
GLUCOSE: 153 mg/dL — AB (ref 65–99)
POTASSIUM: 4 mmol/L (ref 3.5–5.1)
Sodium: 140 mmol/L (ref 135–145)
Total Bilirubin: 1.5 mg/dL — ABNORMAL HIGH (ref 0.3–1.2)
Total Protein: 4.7 g/dL — ABNORMAL LOW (ref 6.5–8.1)

## 2017-11-06 LAB — PHOSPHORUS: Phosphorus: 2.1 mg/dL — ABNORMAL LOW (ref 2.5–4.6)

## 2017-11-06 LAB — HEPARIN LEVEL (UNFRACTIONATED)
HEPARIN UNFRACTIONATED: 0.39 [IU]/mL (ref 0.30–0.70)
Heparin Unfractionated: 0.29 IU/mL — ABNORMAL LOW (ref 0.30–0.70)

## 2017-11-06 LAB — MAGNESIUM: MAGNESIUM: 1.8 mg/dL (ref 1.7–2.4)

## 2017-11-06 MED ORDER — DEXTROSE-NACL 5-0.45 % IV SOLN
INTRAVENOUS | Status: DC
  Administered 2017-11-06 – 2017-11-07 (×3): via INTRAVENOUS
  Administered 2017-11-08: 75 mL/h via INTRAVENOUS

## 2017-11-06 MED ORDER — POTASSIUM PHOSPHATES 15 MMOLE/5ML IV SOLN
20.0000 mmol | Freq: Once | INTRAVENOUS | Status: AC
  Administered 2017-11-06: 20 mmol via INTRAVENOUS
  Filled 2017-11-06: qty 6.67

## 2017-11-06 MED ORDER — HEPARIN (PORCINE) IN NACL 100-0.45 UNIT/ML-% IJ SOLN
1050.0000 [IU]/h | INTRAMUSCULAR | Status: DC
  Administered 2017-11-06: 1050 [IU]/h via INTRAVENOUS
  Filled 2017-11-06: qty 250

## 2017-11-06 NOTE — Progress Notes (Addendum)
Palliative care brief note  I met today with patient and his HCPOA Logan Bean).  His HCPOA does not think that he would want to pursue placement of tube for artificial feeding, however they are hopeful that he will be able to participate in decision making.  Today, Logan Bean report that his pain is so bad that he cannot consider anything else.  Discussed plan to start fentanyl patch, rotate rescue med to dilaudid, and will follow-up in AM to discuss goals again at that point once pain better controlled.  Full consult to follow.  Micheline Rough, MD [place] Team [phone]

## 2017-11-06 NOTE — Progress Notes (Signed)
PROGRESS NOTE    Logan Bean  XIP:[id number] DOB: June 23, 1921 DOA: 10/31/2017 PCP: Lauree Chandler, NP   Brief Narrative:  Logan Bean a 84 y.o.malewith medical history significant for HTN, PVD, venous stasis ulcers status post stenting on left SFA, A. Fib, CHF, and other comorbids who presented to the ED c/o right hip pain after mechanical fall PTA on 10/31/17. Denies hitting his head or LOC. In the ED, xray showed R hip fracture. Of note, pt was just discharged from the hospital on 10/28/17 after being treated for left leg cellulitis. Orthopedics consulted.  Pt admitted for further management. And underwent Right Hip Arthoplasty on 11/02/17.  Speech reevaluated yesterday and patient remained NPO but care discussion they will be note feeding tube placed.  Plan is to restart p.o. intake with understanding the aspiration is going to occur and patient has been placed on a dysphagia 1 diet.  If patient admitted aspirates they will look for end-of-life care with residential hospice however if he tolerates p.o. without immediate aspiration he will have his medications adjusted back to p.o. and discharge to SNF likely in the a.m.  Assessment & Plan:   Principal Problem:   Hip fracture (HCC) Active Problems:   Dyslipidemia   GERD (gastroesophageal reflux disease)   CHF (congestive heart failure) (HCC)   Chronic atrial fibrillation (HCC)   Chronic anticoagulation-Eliquis   CKD (chronic kidney disease) stage 3, GFR 30-59 ml/min (HCC)   Fall   Type 2 diabetes mellitus with diabetic chronic kidney disease (HCC)   Iron deficiency anemia secondary to inadequate dietary iron intake   Cellulitis   Abnormal chest x-ray  Right hip fracture 2/2 mechanical fall S/P R hemiarthroplasty on 11/02/17 -Pain management by Palliative Care;  -Increased patient's IV fentanyl from 25 mcg every 2 H PRN to every hour as needed yesterday however Dr. Domingo Cocking of palliative care started the patient on a fentanyl  patch and gave him IV Dilaudid which showed the patient to be much more comfortable -Continue with Robaxin per orthopedic surgery -Bowel regimen with bisacodyl 10 mg rectal suppositories daily as needed, max 800 g p.o. twice daily, MiraLAX 17 g daily as needed for mild constipation, -PT/OT per Ortho -Likely permanent SNF/NH placement and likely will be discharged in the next 24 to 48 hours -SW on board and is to follow-up with the patient and family after palliative meeting to discuss discharge planning.  Chronic A. Fib with RVR -Likely due to post op/pain -Placed on Diltiazem drip in PACU rate has been controlled and will likely change to p.o. in the a.m. given the plan is to restart p.o. on take with the understand the aspiration is going to occur.  The patient tolerated p.o. without immediate aspiration he will be able to be transitioned to his oral medications and be discharged to SNF -CHADS2-VASc score of 5 -Started IV heparin w/o bolus but will resume Eliquis likely in the morning  Dysphagia -SLP on board: Severe aspiration risk, rec NPO for now -SLP evaluated and patient remains n.p.o. given frank aspiration risk; Recommending against MBS at this time as they suspect will diagnose severe dysphagia and high aspiration/malnutrition risk  -Dietician consulted for possible tube feeds after re-evaluation with SLP  -NPO, IV meds -Palliative Care gust with the healthcare power of attorney's and patient still has capacity make decisions and refuses PEG or nasogastric tube feeding.  Plan is to restart oral intake with understanding aspect and the likely going to occur and if patient aspirates immediately  then plans to transition to hospice care however if patient tolerates food without immediate aspiration he will likely be transitioned to his oral medications and be discharged to SNF in the a.m.  Left lower extremity MRSA cellulitis/wound infection -Improving -Wound cultures grew  MRSA -Discharged home on Bactrim, completed  course on 11/03/17 -Wound care consulted -Monitor renal function closely, avoid nephrotoxic -Repeat CMP  ??Pulmonary nodule/mass -CT chest showed lobulated mass within the major fissure on the RIGHT measuring 8.3 x 2.1 x 4.7cm. Likely pseudotumor related to previous pleural effusion. Less likely, pleural lipoma or loculated hematoma. Recommended continued radiographic or CT follow-up to document decreased size -Same mass was noted on CXR in 2015 (1.7cm) -PCP to follow up discharge  AKI, Improved -Creatinine 1.28 on admission, baseline WNL -Continue gentle IV fluids with normal saline at 75 mL's per hour -BUN/creatinine is now 26/0.73 -Held home lisinopril, avoid nephrotoxics; patient's blood pressure has been on the lower side this afternoon likely secondary to pain medication -Strict I and O's, Daily Weights; patient is +3.052 L since admission -Nursing and said that she only made around 300 mL's this entire shift yesterday however UOP has improved  Diabetes mellitus type 2 diabetes -A1c in January 2019, 7.4 -SSI, Accu-Cheks -Hold home Metformin -CBGs have been ranging from 124-136  Hypertension -Stable -Lasix and Lisinopril on hold as patient likely resume in the a.m. if patient tolerates food  Microcytic Anemia -Baseline around 11, dropped to 8.8 postoperatively and current hemoglobin hematocrit is stable at 9.3/30.5 -Likely due to iron deficiency anemia -Type and screen done, transfuse if hgb <7 -Continue iron supplements with ferrous sulfate 325 mg 3 times daily with meals when patient is able to take p.o.  Hyperbilirubinemia -Patient's T bili was 1.5 this a.m. -Continue to monitor and repeat CMP in a.m.  Non-gap Metabolic Acidosis -Patient's CO2 this AM was 14 and AG was 12 -Bicarbonate drip started and improved acidosis. -We will bicarbonate and start D5 half-normal saline at 75 mL's per hour  DVT prophylaxis:  Anticoagulated with Heparin gtt Code Status: DNR Family Communication: No family present at bedside  Disposition Plan: Pending clinical course. Anticipate discharge to SNF if not being made [place] patient does not aspirate immediately  Consultants:   Orthopedic Surgery Dr. Alvan Dame  Palliative Care Medicine   WOC Nurse  Procedures:  Right hip hemiarthroplasty    Antimicrobials:  Anti-infectives (From admission, onward)   Start     Dose/Rate Route Frequency Ordered Stop   11/03/17 0200  ceFAZolin (ANCEF) IVPB 2g/100 mL premix     2 g 200 mL/hr over 30 Minutes Intravenous Every 6 hours 11/02/17 2326 11/03/17 0926   11/02/17 1741  vancomycin (VANCOCIN) 1-5 GM/200ML-% IVPB    Note to Pharmacy:  Waldron Session   : cabinet override      11/02/17 1741 11/03/17 0544   11/02/17 1601  ceFAZolin (ANCEF) 2-4 GM/100ML-% IVPB    Note to Pharmacy:  Waldron Session   : cabinet override      11/02/17 1601 11/03/17 0414   11/01/17 0730  ceFAZolin (ANCEF) IVPB 2g/100 mL premix  Status:  Discontinued     2 g 200 mL/hr over 30 Minutes Intravenous On call to O.R. 11/01/17 0728 11/01/17 1113   10/31/17 2200  sulfamethoxazole-trimethoprim (BACTRIM DS,SEPTRA DS) 800-160 MG per tablet 1 tablet  Status:  Discontinued     1 tablet Oral 2 times daily 10/31/17 1233 11/03/17 1604     Subjective: Patient was seen and examined this  this a.m. as stated he had no pain.  No chest pain, no shortness breath, nausea, or vomiting.  Adamantly refusing PEG tube feeding or nasogastric tube feeding.  Objective: Vitals:   11/05/17 1526 11/05/17 2249 11/06/17 0450 11/06/17 1436  BP: (!) 155/75 (!) 156/92 (!) 149/64 (!) 117/56  Pulse: 94 (!) 123 64 67  Resp: 18 (!) 22 18 18   Temp: 98.4 F (36.9 C) 98.5 F (36.9 C) 97.8 F (36.6 C) 97.8 F (36.6 C)  TempSrc: [place]  SpO2: 93% 92% 99% 97%  Weight:      Height:        Intake/Output Summary (Last 24 hours) at 11/06/2017 2000 Last data filed at  11/06/2017 1841 Gross per 24 hour  Intake 2750.51 ml  Output 575 ml  Net 2175.51 ml   Filed Weights   10/31/17 0922 11/02/17 1720 11/02/17 2306  Weight: 56.7 kg (125 lb) 56.7 kg (125 lb) 66.6 kg (146 lb 13.2 oz)   Examination: Physical Exam:  Constitutional: Thin transgendered male who appears calm and in no acute distress appears much more comfortable today Eyes: Lids and conjunctive are normal.  Sclera anicteric ENMT: External ears and nose appear normal.  Slightly hard of hearing Neck: Supple with no appreciable JVD Respiratory: Diminished to auscultation bilaterally with no appreciable wheezing, rales, rhonchi.  Normal respiratory effort and patient is not tachypneic Cardiovascular: Irregularly irregular.  No appreciable murmurs, rubs, gallops.  Mild lower extremity edema Abdomen: Soft, nontender, nondistended.  Bowel sounds present all 4 quadrants GU: Deferred Musculoskeletal: Contractures, no cyanosis Skin: Warm and dry with lower extremity skin changes and mild erythema Neurologic: Cranial nerves II through XII grossly intact no appreciable focal deficit Psychiatric: Awake and alert and oriented x3.  Intact judgment and insight.  Normal mood and affect  Data Reviewed: I have personally reviewed following labs and imaging studies  CBC: Recent Labs  Lab 11/02/17 0414 11/03/17 0339 11/04/17 0535 11/05/17 0454 11/06/17 0529  WBC 10.1 10.3 9.0 9.4 8.2  NEUTROABS 8.4* 8.7* 7.1 7.3 6.3  HGB 8.8* 9.9* 9.4* 10.0* 9.3*  HCT 28.6* 31.1* 29.9* 33.3* 30.5*  MCV 76.7* 76.4* 78.5 79.7 79.0  PLT 296 268 286 325 294   Basic Metabolic Panel: Recent Labs  Lab 11/03/17 0339 11/03/17 0751 11/04/17 0535 11/05/17 0454 11/06/17 0529  NA 137 137 137 135 140  K 4.8 5.2* 4.6 4.6 4.0  CL 109 110 110 109 110  CO2 18* 18* 19* 14* 21*  GLUCOSE 198* 197* 131* 135* 153*  BUN 29* 31* 30* 31* 26*  CREATININE 0.99 1.02 0.90 0.78 0.73  CALCIUM 8.4* 8.5* 8.4* 8.5* 8.4*  MG  --   --   --   1.9 1.8  PHOS  --   --   --  2.5 2.1*   GFR: Estimated Creatinine Clearance (by C-G formula based on SCr of 0.73 mg/dL) Male: 38.6 mL/min Male: 44.5 mL/min Liver Function Tests: Recent Labs  Lab 10/31/17 1007 11/05/17 0454 11/06/17 0529  AST 25 19 16   ALT 27 16* 15*  ALKPHOS 88 71 65  BILITOT 0.9 1.3* 1.5*  PROT 6.1* 5.5* 4.7*  ALBUMIN 3.0* 2.5* 2.2*   No results for input(s): LIPASE, AMYLASE in the last 168 hours. No results for input(s): AMMONIA in the last 168 hours. Coagulation Profile: Recent Labs  Lab 10/31/17 1007  INR 1.33   Cardiac Enzymes: No results for input(s): CKTOTAL, CKMB, CKMBINDEX, TROPONINI in the last 168 hours. BNP (last  3 results) No results for input(s): PROBNP in the last 8760 hours. HbA1C: No results for input(s): HGBA1C in the last 72 hours. CBG: Recent Labs  Lab 11/05/17 1724 11/05/17 2242 11/06/17 0732 11/06/17 1230 11/06/17 1704  GLUCAP 109* 138* 131* 136* 124*   Lipid Profile: No results for input(s): CHOL, HDL, LDLCALC, TRIG, CHOLHDL, LDLDIRECT in the last 72 hours. Thyroid Function Tests: No results for input(s): TSH, T4TOTAL, FREET4, T3FREE, THYROIDAB in the last 72 hours. Anemia Panel: No results for input(s): VITAMINB12, FOLATE, FERRITIN, TIBC, IRON, RETICCTPCT in the last 72 hours. Sepsis Labs: No results for input(s): PROCALCITON, LATICACIDVEN in the last 168 hours.  Recent Results (from the past 240 hour(s))  MRSA PCR Screening     Status: None   Collection Time: 11/01/17 12:21 AM  Result Value Ref Range Status   MRSA by PCR NEGATIVE NEGATIVE Final    Comment:        The GeneXpert MRSA Assay (FDA approved for NASAL specimens only), is one component of a comprehensive MRSA colonization surveillance program. It is not intended to diagnose MRSA infection nor to guide or monitor treatment for MRSA infections. Performed at [facility], [place] [address], [place], [place] [address]   Urine culture      Status: None   Collection Time: 11/03/17 12:41 AM  Result Value Ref Range Status   Specimen Description   Final    URINE, CATHETERIZED Performed at [facility] [address], [place], [place] [address]    Special Requests NONE  Final   Culture   Final    NO GROWTH Performed at [facility], [place] [address], [place], [place] [address]    Report Status 11/04/2017 FINAL  Final    Radiology Studies: No results found. Scheduled Meds: . calcium-vitamin D  1 tablet Oral BID WC  . fentaNYL  25 mcg Transdermal Q72H  . insulin aspart  0-15 Units Subcutaneous TID WC  . insulin aspart  0-5 Units Subcutaneous QHS   Continuous Infusions: . dextrose 5 % and 0.45% NaCl 75 mL/hr at 11/06/17 1326  . diltiazem (CARDIZEM) infusion 5 mg/hr (11/06/17 0931)  . heparin 1,050 Units/hr (11/06/17 1606)  . methocarbamol (ROBAXIN)  IV Stopped (11/05/17 0251)    LOS: 6 days    Kerney Elbe, DO [facility] Pager [phone]  If 7PM-7AM, please contact night-coverage [email or web address] Password [email or web address] 11/06/2017, 8:00 PM

## 2017-11-06 NOTE — Progress Notes (Signed)
Pharmacy: Re- Heparin  Patient's a 84 y.o M with chronic Afib on eliquis PTA.  Eliquis placed on hold for hemiarthroplasty on 4/22 .  Patient failed swallow screen on 4/23 and is now NPO. She's currently on heparin drip while off Eliquis.   - heparin level is therapeutic at 0.39  (goal 0.3-0.7)   Plan: - continue heparin drip at 1050 units/hr - f/u with AM labs and adjust dose if/when appropriate - monitor for s/s bleeding  Dia Sitter, PharmD, BCPS 11/06/2017 5:07 PM

## 2017-11-06 NOTE — Care Management Note (Signed)
Case Management Note  Patient Details  Name: Santana Edell MRN: [id number] Date of Birth: 1920-09-02  Subjective/Objective:Noted Palliative note.HCPOA-Lynn, & Scott. No feeding tube. Residential hospice(if aspirates), SNF(if tolerates po). CSW following.                    Action/Plan:   Expected Discharge Date:  11/04/17               Expected Discharge Plan:  [place]  In-House Referral:  Clinical Social Work  Discharge planning Services  CM Consult  Post Acute Care Choice:    Choice offered to:     DME Arranged:    DME Agency:     HH Arranged:    HH Agency:     Status of Service:  In process, will continue to follow  If discussed at Long Length of Stay Meetings, dates discussed:    Additional Comments:  Dessa Phi, RN 11/06/2017, 2:32 PM

## 2017-11-06 NOTE — Progress Notes (Signed)
Palliative Care Progress Note  I met today with patient this AM.  He reports that pain is much better today, and he is appreciative of changes to regimen.  We discussed concerns regarding swallow functions, aspiration, and options of feeding for comfort despite risk vs consideration for placing tube for artificial nutrition.  When I asked his thoughts on feeding tube clearly stated, "Oh please don't do that.  I don't want that."   This also is in line with prior advance directive declining feeding tube.  His friends/HCPOA arrived shortly after and I met again with them in conjunction with Win.  We reviewed plan as below:  - No feeding tube.  Will therefore plan to restart PO intake with understanding that aspiration is going to occur.  Will discuss with SLP for guidance on best plan to restart feeds. - If immediately aspirates, will likely be looking at end of life with residential hospice as likely plan for disposition. - If he tolerates PO without immediate aspiration, will work to get to PO regimen for medications with likely dispo to SNF. - Pain improved today.  Continue same. - He has been clear that his friends, Jeani Hawking and Nicki Reaper, are his surrogate for healthcare decisions.  He states filling our paperwork in the past, but there is not a copy present and Jeani Hawking and Nicki Reaper do not have a copy of it.  Will therefore request spiritual care to complete another HCPOA.   Total time: 74 minutes Micheline Rough, MD [facility] [phone]  Greater than 50%  of this time was spent counseling and coordinating care related to the above assessment and plan.

## 2017-11-06 NOTE — Progress Notes (Signed)
Physical Therapy Treatment Patient Details Name: Logan Bean MRN: [id number] DOB: 11/18/1920 Today's Date: 11/06/2017    History of Present Illness Lael Dottavio was admitted after a fall resulting in a R hip fx, s/p hemi arthroplasty. PMH:  HTN, PVD, arthritis, CHF, kidney disease, venous stasis ulcers    PT Comments    Patient with limited progress due to pain just recently under control and prefers not to exacerbate with moving to EOB.  Did participate well in bed level there ex and feel he can continue to benefit from skilled PT in the acute setting to progress mobility as tolerated.  May benefit from OOB to chair via lift next session if assists with swallowing/managing secretions.   Follow Up Recommendations  SNF     Equipment Recommendations  None recommended by PT    Recommendations for Other Services       Precautions / Restrictions Precautions Precautions: Posterior Hip;Fall Restrictions RLE Weight Bearing: Weight bearing as tolerated    Mobility  Bed Mobility               General bed mobility comments: NT per pt preference  Transfers                    Ambulation/Gait                 Stairs             Wheelchair Mobility    Modified Rankin (Stroke Patients Only)       Balance                                            Cognition Arousal/Alertness: Lethargic Behavior During Therapy: Flat affect Overall Cognitive Status: Difficult to assess                                        Exercises General Exercises - Upper Extremity Shoulder Flexion: AAROM;5 reps;Both;Supine Shoulder ABduction: AAROM;5 reps;Right;Supine Elbow Flexion: AAROM;5 reps;Supine Digit Composite Flexion: AROM;5 reps;Both;Supine General Exercises - Lower Extremity Ankle Circles/Pumps: AROM;Both;Supine;10 reps Quad Sets: AAROM;Right;5 reps;Supine Heel Slides: AAROM;Both;5 reps;Supine    General Comments         Pertinent Vitals/Pain Pain Assessment: Faces Faces Pain Scale: Hurts even more Pain Location: R leg w/ therex Pain Descriptors / Indicators: Grimacing Pain Intervention(s): Monitored during session;Repositioned;Premedicated before session    Home Living                      Prior Function            PT Goals (current goals can now be found in the care plan section) Progress towards PT goals: Progressing toward goals(limited)    Frequency    Min 2X/week      PT Plan Current plan remains appropriate    Co-evaluation              AM-PAC PT "6 Clicks" Daily Activity  Outcome Measure  Difficulty turning over in bed (including adjusting bedclothes, sheets and blankets)?: Unable Difficulty moving from lying on back to sitting on the side of the bed? : Unable Difficulty sitting down on and standing up from a chair with arms (e.g., wheelchair, bedside commode, etc,.)?: Unable Help needed moving to and from  a bed to chair (including a wheelchair)?: Total Help needed walking in hospital room?: Total Help needed climbing 3-5 steps with a railing? : Total 6 Click Score: 6    End of Session   Activity Tolerance: Patient limited by fatigue Patient left: in bed;with call bell/phone within reach   PT Visit Diagnosis: Other abnormalities of gait and mobility (R26.89);Muscle weakness (generalized) (M62.81)     Time: 4081-4481 PT Time Calculation (min) (ACUTE ONLY): 22 min  Charges:  $Therapeutic Exercise: 8-22 mins                    G CodesMagda Kiel, [place] [phone] 11/06/2017    Reginia Naas 11/06/2017, 12:54 PM

## 2017-11-06 NOTE — Progress Notes (Signed)
ANTICOAGULATION CONSULT NOTE - Follow Up Consult  Pharmacy Consult for Heparin Indication: atrial fibrillation  Allergies  Allergen Reactions  . Morphine And Related Nausea And Vomiting  . Oxycodone Nausea And Vomiting  . Plavix [Clopidogrel] Rash    Rash on upper body    Patient Measurements: Height: 5\' 3"  (160 cm) Weight: 146 lb 13.2 oz (66.6 kg) IBW/kg (Calculated) : 56.9 Heparin Dosing Weight: actual weight  Vital Signs: Temp: 97.8 F (36.6 C) (04/26 0450) Temp Source: [place] (04/26 0450) BP: 149/64 (04/26 0450) Pulse Rate: 64 (04/26 0450)  Labs: Recent Labs    11/04/17 0535 11/05/17 0454 11/05/17 1409 11/06/17 0529  HGB 9.4* 10.0*  --  9.3*  HCT 29.9* 33.3*  --  30.5*  PLT 286 325  --  307  HEPARINUNFRC 0.36 0.27* 0.34 0.29*  CREATININE 0.90 0.78  --  0.73    Estimated Creatinine Clearance (by C-G formula based on SCr of 0.73 mg/dL) Male: 38.6 mL/min Male: 44.5 mL/min  Assessment: 84 yo male admitted s/p fall on 4/20 sustaining a right hip fracture, s/p hemiarthroplasty 4/22 PM.  Patient is on chronic Eliquis for afib, last dose taken 4/20 at 0700.  Postoperatively, ordered to resume Eliquis but patient failed swallow evaluation 4/23, therefore, changing therapy to IV heparin, Pharmacy to dose.  Today, 11/06/2017  Heparin level slightly subtherapeutic at 0.29 units/mL on heparin infusion at 1000 units/hr  Hgb decreased to 9.3 today, Pltc WNL  No bleeding or infusion issues reported per nursing   Goal of Therapy:  Heparin level 0.3-0.7 units/ml Monitor platelets by anticoagulation protocol: Yes   Plan:   Increase heparin infusion slightly to 1050 units/hr   Heparin level 8 hours after rate change  Daily heparin level and CBC  Monitor closely for signs/symptoms of post-op bleeding  F/u plans for long-term anticoagulation pending goals of care discussion   Lindell Spar, PharmD, BCPS Pager: [phone] 11/06/2017 7:41 AM

## 2017-11-06 NOTE — Consult Note (Addendum)
Consultation Note Date: 11/06/2017   Patient Name: Logan Bean  DOB: [birth year]  MRN: [id number]  Age / Sex: 84 y.o., adult  PCP: Lauree Chandler, NP Referring Physician: Kerney Elbe, DO  Reason for Consultation: Establishing goals of care  HPI/Patient Profile: 84 y.o. adult  with past medical history of retention, peripheral vascular disease, arthritis, varicose veins, recent cellulitis admitted on 10/31/2017 with hip fracture.  He is status post repair and has subsequently been found to have ineffective swallow.  Palliative consulted for goals of care  Clinical Assessment and Goals of Care: I met today with Logan Bean.  He reports being in significant pain and not able to focus on any other part of conversation other than how much he hurts.  He had 2 friends at the bedside, Jeani Hawking and [place], whom he lives with.  He asked me to meet with them to discuss plan moving forward.  They report that Logan Bean has been always been very independent and is widowed after a long marriage.  He was living by himself near [place] but has subsequently moved into an in-law apartment with Jeani Hawking and [place].  The most important things to him have always been his wife and his independence.  We discussed clinical course as well as wishes moving forward in regard to advanced directives. Values and goals of care important to patient and family were attempted to be elicited.   We had a long discussion regarding overall decline in nutrition and functional status and also had a long discussion regarding swallow evaluation and the fact that he is not effectively swallowing.  Concepts specific to code status and rehospitalization discussed.  We discussed difference between a aggressive medical intervention path and a palliative, comfort focused care path.    We discussed options for feeding for comfort with understanding that he is  going to aspirate versus consideration of artificial nutrition and hydration.  We also discussed that, if he does pursue artificial nutrition and hydration, this does not eliminate risk of aspiration.  Jeani Hawking reports that she does not think that he would want to pursue a feeding tube if this is something that she wants to be discussed with him if possible.  There is a copy of prior advanced directive that outlines that he would not want artificial nutrition.  Concept of Hospice and Palliative Care were discussed  Questions and concerns addressed.   PMT will continue to support holistically.  SUMMARY OF RECOMMENDATIONS   -I met with patient's healthcare powers of attorney Jeani Hawking and [place]).  They do not think that he would want to pursue placement of tube for artificial feeding, however they are hopeful that he will be able to participate in decision making. - Today, [name] report that his pain is so bad that he cannot consider anything else.  Discussed plan to start fentanyl patch, rotate rescue med to dilaudid, and will follow-up in AM to discuss goals again at that point once pain better controlled.   Code Status/Advance Care Planning:  DNR  Symptom Management:   Pain: Currently poorly controlled.  He has been getting fentanyl 25 mcg nearly every hour.  We will therefore plan for initiation of fentanyl patch 25 mcg/h.  If he is still having breakthrough pain, will also plan for rotation of rescue medication from fentanyl to IV Dilaudid as needed.  Palliative Prophylaxis:   Bowel Regimen and Frequent Pain Assessment  Additional Recommendations (Limitations, Scope, Preferences):  Avoid Hospitalization  Psycho-social/Spiritual:   Desire for further Chaplaincy support:yes  Additional Recommendations: Education on Hospice  Prognosis:   Unable to determine  Discharge Planning: To Be Determined      Primary Diagnoses: Present on Admission: . Cellulitis . Chronic atrial  fibrillation ([place]) . CKD (chronic kidney disease) stage 3, GFR 30-59 ml/min (HCC) . Dyslipidemia . Fall . GERD (gastroesophageal reflux disease) . Iron deficiency anemia secondary to inadequate dietary iron intake . Type 2 diabetes mellitus with diabetic chronic kidney disease ([place]) . Hip fracture ([place]) . Abnormal chest x-ray   I have reviewed the medical record, interviewed the patient and family, and examined the patient. The following aspects are pertinent.  Past Medical History:  Diagnosis Date  . Anxiety   . Arthritis    "probably all over" (10/21/2017)  . Depression   . Diaphragmatic hernia without mention of obstruction or gangrene   . Dizziness and giddiness   . Edema   . GERD (gastroesophageal reflux disease)   . H/O hiatal hernia   . History of gout   . History of kidney stones   . History of stomach ulcers   . Hypercholesteremia   . Hypertension   . Left rotator cuff tear Jul 12, 2012  . PVD (peripheral vascular disease) ([place])   . Stomach ulcer    years  . Type II diabetes mellitus ([place])   . Unspecified hereditary and idiopathic peripheral neuropathy   . Unspecified vitamin D deficiency   . Varicose veins    both legs   Social History   Socioeconomic History  . Marital status: Widowed    Spouse name: Not on file  . Number of children: 0  . Years of education: Not on file  . Highest education level: Not on file  Occupational History  . Not on file  Social Needs  . Financial resource strain: Not hard at all  . Food insecurity:    Worry: Never true    Inability: Never true  . Transportation needs:    Medical: No    Non-medical: No  Tobacco Use  . Smoking status: Former Smoker    Years: 50.00    Types: Pipe, Cigarettes    Last attempt to quit: 07/14/1996    Years since quitting: 21.3  . Smokeless tobacco: Never Used  Substance and Sexual Activity  . Alcohol use: Yes    Alcohol/week: 0.6 oz    Types: 1 Cans of beer per week  . Drug use: No  .  Sexual activity: Never  Lifestyle  . Physical activity:    Days per week: 0 days    Minutes per session: 0 min  . Stress: Not at all  Relationships  . Social connections:    Talks on phone: More than three times a week    Gets together: More than three times a week    Attends religious service: Never    Active member of club or organization: No    Attends meetings of clubs or organizations: Never    Relationship status: Widowed  Other Topics  Concern  . Not on file  Social History Narrative   Lives alone.   Family History  Problem Relation Age of Onset  . Parkinson's disease Mother   . Hip fracture Mother   . Heart disease Mother   . Alzheimer's disease Mother   . Heart disease Father   . Parkinson's disease Sister   . Heart disease Brother    Scheduled Meds: . calcium-vitamin D  1 tablet Oral BID WC  . fentaNYL  25 mcg Transdermal Q72H  . insulin aspart  0-15 Units Subcutaneous TID WC  . insulin aspart  0-5 Units Subcutaneous QHS   Continuous Infusions: . dextrose 5 % and 0.45% NaCl 75 mL/hr at 11/06/17 1326  . diltiazem (CARDIZEM) infusion 5 mg/hr (11/06/17 0931)  . heparin 1,050 Units/hr (11/06/17 0820)  . methocarbamol (ROBAXIN)  IV Stopped (11/05/17 0251)  . potassium PHOSPHATE IVPB (mmol) 20 mmol (11/06/17 0934)   PRN Meds:.acetaminophen, bisacodyl, diphenhydrAMINE, HYDROmorphone (DILAUDID) injection, magnesium citrate, menthol-cetylpyridinium **OR** phenol, methocarbamol **OR** methocarbamol (ROBAXIN)  IV, metoCLOPramide **OR** metoCLOPramide (REGLAN) injection, ondansetron **OR** ondansetron (ZOFRAN) IV, polyethylene glycol, sodium chloride flush Medications Prior to Admission:  Prior to Admission medications   Medication Sig Start Date End Date Taking? Authorizing Provider  acetaminophen (TYLENOL) 325 MG tablet Take 2 tablets (650 mg total) by mouth every 6 (six) hours as needed for mild pain (or Fever >/= 101). 10/28/17  Yes Roxan Hockey, MD  acetaminophen  (TYLENOL) 500 MG tablet Take 500 mg by mouth every 6 (six) hours as needed for mild pain, moderate pain or headache.    Yes [provider]  aspirin EC 81 MG tablet Take 162 mg by mouth 2 (two) times daily.    Yes [provider]  Calcium Carbonate-Vitamin D 600-200 MG-UNIT TABS Take 1 tablet by mouth 2 (two) times daily. Take 1 tablet daily for vitamin supplement.   Yes [provider]  collagenase (SANTYL) ointment Apply topically daily. 10/28/17  Yes Roxan Hockey, MD  diphenhydrAMINE (BENADRYL ALLERGY) 25 MG tablet Take 25 mg by mouth at bedtime as needed (rash due to Plavix).   Yes [provider]  ELIQUIS 2.5 MG TABS tablet TAKE 1 TABLET BY MOUTH TWICE DAILY. 06/11/17  Yes Lauree Chandler, NP  ferrous sulfate 325 (65 FE) MG tablet Take 325 mg by mouth 2 (two) times a week. Takes one tablet by mouth every three days    Yes [provider]  fexofenadine (ALLEGRA ALLERGY) 180 MG tablet Take 1 tablet (180 mg total) by mouth daily. 10/28/17  Yes Roxan Hockey, MD  furosemide (LASIX) 20 MG tablet take 10-20 mg by mouth daily as needed for fluid--do not take while you are taking Bactrim antibiotic 10/28/17  Yes Roxan Hockey, MD  Glucosamine-Chondroit-Vit C-Mn (GLUCOSAMINE 1500 COMPLEX) CAPS Take one tablet once a day 01/12/13  Yes Bubba Camp, Mahima, MD  glucose blood (TRUETEST TEST) test strip Use to check blood sugar once daily Dx:E11.22 09/16/17  Yes Lauree Chandler, NP  lactobacillus (FLORANEX/LACTINEX) PACK Take 1 packet (1 g total) by mouth 3 (three) times daily with meals. 10/28/17  Yes Emokpae, Courage, MD  metFORMIN (GLUCOPHAGE) 500 MG tablet TAKE 1 TABLET BY MOUTH TWICE DAILY WITH A MEAL. 06/11/17  Yes Lauree Chandler, NP  metoprolol tartrate (LOPRESSOR) 25 MG tablet TAKE 1 TABLET BY MOUTH TWICE DAILY. 06/11/17  Yes Lauree Chandler, NP  Multiple Vitamin (MULITIVITAMIN WITH MINERALS) TABS Take 1 tablet by mouth daily.    Yes [provider]  Omega-3 Fatty Acids (FISH OIL) 1000 MG CAPS Take 1,000 mg by mouth daily.    Yes [provider]  ondansetron (ZOFRAN) 4 MG tablet Take 1 tablet (4 mg total) by mouth every 6 (six) hours as needed for nausea. 10/28/17  Yes Emokpae, Courage, MD  simvastatin (ZOCOR) 10 MG tablet TAKE ONE TABLET AT BEDTIME. Patient taking differently: TAKE ONE TABLET (10 mg)  AT BEDTIME. 06/11/17  Yes Eubanks, Carlos American, NP  TRUEPLUS LANCETS 30G MISC Use to check blood sugar once daily. Dx E11.9 06/13/15  Yes Lauree Chandler, NP  docusate sodium (COLACE) 100 MG capsule Take 1 capsule (100 mg total) by mouth 2 (two) times daily. 11/02/17   Danae Orleans, PA-C  ferrous sulfate (FERROUSUL) 325 (65 FE) MG tablet Take 1 tablet (325 mg total) by mouth 3 (three) times daily with meals. 11/02/17   Danae Orleans, PA-C  HYDROcodone-acetaminophen (NORCO/VICODIN) 5-325 MG tablet Take 1-2 tablets by mouth every 4 (four) hours as needed for up to 5 days for moderate pain. 11/02/17 11/07/17  Danae Orleans, PA-C  methocarbamol (ROBAXIN) 500 MG tablet Take 1 tablet (500 mg total) by mouth every 6 (six) hours as needed for muscle spasms. 11/02/17   Danae Orleans, PA-C  polyethylene glycol (MIRALAX / GLYCOLAX) packet Take 17 g by mouth 2 (two) times daily. 11/02/17   Danae Orleans, PA-C   Allergies  Allergen Reactions  . Morphine And Related Nausea And Vomiting  . Oxycodone Nausea And Vomiting  . Plavix [Clopidogrel] Rash    Rash on upper body   Review of Systems  Reports pain cramping in lower extremity bilaterally.  Denies other complaints, but is focused on current uncontrolled pain.  Physical Exam  General: Thin, chronically ill appearing, in moderate distress due to pain  HEENT: No bruits, no goiter, no JVD Heart: Irregular.  No murmur appreciated. Lungs: Good air movement, clear Abdomen: Soft, nontender, nondistended, positive bowel sounds.  Ext: No significant edema Skin: Warm and  dry  Vital Signs: BP (!) 149/64 (BP Location: Right Arm)   Pulse 64   Temp 97.8 F (36.6 C) (Oral)   Resp 18   Ht _0  (1.6 m)   Wt 66.6 kg (146 lb 13.2 oz)   SpO2 99%   BMI 26.01 kg/m  Pain Scale: 0-10 POSS *See Group Information*: 2-Acceptable,Slightly drowsy, easily aroused Pain Score: Asleep   SpO2: SpO2: 99 % O2 Device:SpO2: 99 % O2 Flow Rate: .O2 Flow Rate (L/min): 2 L/min  IO: Intake/output summary:   Intake/Output Summary (Last 24 hours) at 11/06/2017 1350 Last data filed at 11/06/2017 0900 Gross per 24 hour  Intake 1420 ml  Output 425 ml  Net 995 ml    LBM: Last BM Date: 11/06/17 Baseline Weight: Weight: 56.7 kg (125 lb) Most recent weight: Weight: 66.6 kg (146 lb 13.2 oz)     Palliative Assessment/Data:   Time in: 1440 Time out: 1600 Time Total: 80 Greater than 50%  of this time was spent counseling and coordinating care related to the above assessment and plan.  Signed by: Micheline Rough, MD   Please contact Palliative Medicine Team phone at [phone] for questions and concerns.  For individual provider: See Shea Evans

## 2017-11-06 NOTE — Progress Notes (Signed)
On pt request and referral from Palliative, completed health care power of attorney with [place].  Provided education around document.  He expressed wish to name Jeani Hawking and Nicki Reaper as [facility]   Document completed and notarized with witnesses.   Copy in pt's chart, pt with original and two copies.    [facility] / [facility] Chaplain Jerene Pitch, MDiv, [facility]

## 2017-11-06 NOTE — Progress Notes (Signed)
  Speech Language Pathology Treatment: Dysphagia  Patient Details Name: Logan Bean MRN: [id number] DOB: Mar 21, 1921 Today's Date: 11/06/2017 Time: 0263-7858 SLP Time Calculation (min) (ACUTE ONLY): 50 min  Assessment / Plan / Recommendation Clinical Impression  Pt presents with oropharyngeal dysphagia characterized by non-functional swallow with a delay in initiation of 3 to >10 seconds per swallow with overt s/s of aspiration noted including wet vocal quality, multiple swallows, delayed throat clear/cough (indicating probable pharyngeal residue) and max verbal/tactile cues required during all PO intake paired with decreased LOA, although pt was requesting PO's during swallow re-assessment; minimal volumes of liquid provided via tsp (1/4 tsp amounts) with left anterior loss noted and minimal liquids (<2cc) provided with toothette for satiety d/t xerostomia; ice chips given one per trial (4 total trials) with overt s/s of aspiration noted during intake including delayed cough and wet vocal quality; oral suctioning provided after PO intake; discussed extensive aspiration precautions/signs of aspiration and risk of aspiration with any intake and POA (x2) agree with accepted known risk; discussed completion of MBS with POA (x2) and non-oral feeding is not a wish for patient in advanced directives and would prefer to remain continuing PO intake with known risk of aspiration. Recommend for comfort feeding only initiate a Dysphagia 1 (puree) diet with thin liquids provided via 1/4 tsp amounts and/or via toothette (1/2 full) for satiety re: xerostomia; Palliative MD consulted with and made known desire of pt/POA (x2) for comfort feeding at present time; ST will f/u for diet tolerance and objective testing if warranted pending pt progress.  HPI HPI: 84 yo male adm to [facility] after fall with right hip pain, s/p surgery - Swallow eval ordered.  PMH + for venous stasis, ulcers, HTN, PVD, recent left leg cellulitis,  GERD, HH, esophageal dilatation remotely.  Pt CT head negative, CT chest showed remote rib fxs, ? pseudo tumor right suprahilar opacity.  Pt denies h/o dysphagia prior to admit.   Today pt seen to assess readiness for instrumental evaluation and/or po intake.       SLP Plan  Continue with current plan of care       Recommendations  Diet recommendations: Dysphagia 1 (puree);Thin liquid;Other(comment)(comfort only with known aspiration risk d/t severe dysphagia) Liquids provided via: Teaspoon(1/4 amount and/or toothette) Medication Administration: Via alternative means Supervision: Full supervision/cueing for compensatory strategies;Trained caregiver to feed patient Compensations: Slow rate;Small sips/bites;Monitor for anterior loss;Clear throat intermittently;Hard cough after swallow;Effortful swallow                Oral Care Recommendations: Oral care before and after PO Follow up Recommendations: Other (comment)(TBD) SLP Visit Diagnosis: Dysphagia, oropharyngeal phase (R13.12);Dysphagia, pharyngoesophageal phase (R13.14) Plan: Continue with current plan of care                       Elvina Sidle, M.S., CCC-SLP 11/06/2017, 1:27 PM

## 2017-11-07 LAB — COMPREHENSIVE METABOLIC PANEL
ALT: 14 U/L — ABNORMAL LOW (ref 17–63)
ANION GAP: 10 (ref 5–15)
AST: 14 U/L — ABNORMAL LOW (ref 15–41)
Albumin: 2.2 g/dL — ABNORMAL LOW (ref 3.5–5.0)
Alkaline Phosphatase: 67 U/L (ref 38–126)
BUN: 20 mg/dL (ref 6–20)
CHLORIDE: 105 mmol/L (ref 101–111)
CO2: 23 mmol/L (ref 22–32)
Calcium: 8.3 mg/dL — ABNORMAL LOW (ref 8.9–10.3)
Creatinine, Ser: 0.64 mg/dL (ref 0.61–1.24)
Glucose, Bld: 156 mg/dL — ABNORMAL HIGH (ref 65–99)
Potassium: 4.2 mmol/L (ref 3.5–5.1)
SODIUM: 138 mmol/L (ref 135–145)
Total Bilirubin: 1 mg/dL (ref 0.3–1.2)
Total Protein: 4.9 g/dL — ABNORMAL LOW (ref 6.5–8.1)

## 2017-11-07 LAB — CBC WITH DIFFERENTIAL/PLATELET
Basophils Absolute: 0.1 10*3/uL (ref 0.0–0.1)
Basophils Relative: 1 %
Eosinophils Absolute: 0.4 10*3/uL (ref 0.0–0.7)
Eosinophils Relative: 4 %
HCT: 32 % — ABNORMAL LOW (ref 39.0–52.0)
Hemoglobin: 9.6 g/dL — ABNORMAL LOW (ref 13.0–17.0)
Lymphocytes Relative: 7 %
Lymphs Abs: 0.7 10*3/uL (ref 0.7–4.0)
MCH: 24 pg — ABNORMAL LOW (ref 26.0–34.0)
MCHC: 30 g/dL (ref 30.0–36.0)
MCV: 80 fL (ref 78.0–100.0)
Monocytes Absolute: 0.8 10*3/uL (ref 0.1–1.0)
Monocytes Relative: 8 %
Neutro Abs: 8.4 10*3/uL — ABNORMAL HIGH (ref 1.7–7.7)
Neutrophils Relative %: 80 %
Platelets: 365 10*3/uL (ref 150–400)
RBC: 4 MIL/uL — ABNORMAL LOW (ref 4.22–5.81)
RDW: 18.8 % — ABNORMAL HIGH (ref 11.5–15.5)
WBC: 10.4 10*3/uL (ref 4.0–10.5)

## 2017-11-07 LAB — GLUCOSE, CAPILLARY
GLUCOSE-CAPILLARY: 171 mg/dL — AB (ref 65–99)
Glucose-Capillary: 168 mg/dL — ABNORMAL HIGH (ref 65–99)
Glucose-Capillary: 168 mg/dL — ABNORMAL HIGH (ref 65–99)
Glucose-Capillary: 118 mg/dL — ABNORMAL HIGH (ref 65–99)

## 2017-11-07 LAB — PHOSPHORUS: Phosphorus: 2.4 mg/dL — ABNORMAL LOW (ref 2.5–4.6)

## 2017-11-07 LAB — MAGNESIUM: MAGNESIUM: 1.6 mg/dL — AB (ref 1.7–2.4)

## 2017-11-07 LAB — HEPARIN LEVEL (UNFRACTIONATED): Heparin Unfractionated: 0.37 [IU]/mL (ref 0.30–0.70)

## 2017-11-07 MED ORDER — MAGNESIUM SULFATE 2 GM/50ML IV SOLN
2.0000 g | Freq: Once | INTRAVENOUS | Status: AC
  Administered 2017-11-07: 2 g via INTRAVENOUS
  Filled 2017-11-07: qty 50

## 2017-11-07 MED ORDER — ATROPINE SULFATE 1 % OP SOLN
3.0000 [drp] | OPHTHALMIC | Status: DC | PRN
  Administered 2017-11-07 – 2017-11-08 (×4): 3 [drp] via SUBLINGUAL
  Filled 2017-11-07: qty 2

## 2017-11-07 MED ORDER — APIXABAN 2.5 MG PO TABS: 2.5000 mg | ORAL_TABLET | Freq: Two times a day (BID) | ORAL | Status: DC

## 2017-11-07 NOTE — Progress Notes (Signed)
PROGRESS NOTE    Logan Bean  DGL:[id number] DOB: [birth year] DOA: 10/31/2017 PCP: Lauree Chandler, NP   Brief Narrative:  Logan Andersis a 84 y.o.malewith medical history significant for HTN, PVD, venous stasis ulcers status post stenting on left SFA, A. Fib, CHF, and other comorbids who presented to the ED c/o right hip pain after mechanical fall PTA on 10/31/17. Denies hitting his head or LOC. In the ED, xray showed R hip fracture. Of note, pt was just discharged from the hospital on 10/28/17 after being treated for left leg cellulitis. Orthopedics consulted.  Pt admitted for further management. And underwent Right Hip Arthoplasty on 11/02/17.  Speech reevaluated yesterday and patient remained NPO after palliative care discussion patient wants to eat and does not want a feeding tube.  Patient was started back on a dysphagia 1 diet.  Palliative care involved for goals of care and patient does not want to go to a skilled nursing facility and does not want a feeding tube.  He will be agreeable to residential hospice if his healthcare power of attorney agree that this will be a good fit for him after they tour the facility.  Assessment & Plan:   Principal Problem:   Hip fracture (HCC) Active Problems:   Dyslipidemia   GERD (gastroesophageal reflux disease)   CHF (congestive heart failure) (HCC)   Chronic atrial fibrillation (HCC)   Chronic anticoagulation-Eliquis   CKD (chronic kidney disease) stage 3, GFR 30-59 ml/min (HCC)   Fall   Type 2 diabetes mellitus with diabetic chronic kidney disease (HCC)   Iron deficiency anemia secondary to inadequate dietary iron intake   Cellulitis   Abnormal chest x-ray  Right hip fracture 2/2 mechanical fall S/P R hemiarthroplasty on 11/02/17 -Pain management by Palliative Care;  -Dr. Domingo Cocking of palliative care started the patient on a fentanyl patch and gave him IV Dilaudid which showed the patient to be much more comfortable and pain is much more  controlled. -Continue with Robaxin per orthopedic surgery -Bowel regimen with bisacodyl 10 mg rectal suppositories daily as needed, max 800 g p.o. twice daily, MiraLAX 17 g daily as needed for mild constipation, -PT/OT recommend SNF -Likely permanent SNF/NH placement but patient is adamantly refusing skilled nursing facility and healthcare power of attorney's are unable to take the patient home safely. -SW on board and is to follow-up with the patient and family after palliative meeting to discuss discharge planning.  Patient still has capacity to make decisions now adamantly refusing skilled nursing facility. -Would be open to residential hospice but wants his healthcare power of attorneys to view the facility first -Healthcare power of attorney is to visit with [facility]  Chronic A. Fib with RVR -Likely due to post op/pain -Placed on Diltiazem drip in PACU rate has been controlled we will not transition to p.o. until goals of care been clarified.  Patient may likely go to hospice but is unclear at this point as patient's healthcare power of attorney will need to [facility] and discussed with the patient more. -CHADS2-VASc score of 5 -Started IV heparin w/o bolus and resumed Eliquis this AM will now stop both heparin drip and Eliquis.  Palliative care discussion is currently being held and patient may go on hospice  Dysphagia -SLP on board: Severe aspiration risk, -SLP evaluated and patient remained n.p.o. given frank aspiration risk; They Recommended against MBS because they suspected will diagnose severe dysphagia and high aspiration/malnutrition risk   -Palliative Care gust with the healthcare  power of attorney's and patient still has capacity make decisions and refuses PEG or nasogastric tube feeding.  Restarted oral intake with Dysphagia 1 Diet with understanding that aspiration is likely going to occur and if patient aspirates immediately then plans to transition to hospice care  however if patient tolerates food without immediate aspiration he will likely be transitioned to his oral medications. Patient was to be discharged to SNF today however adamantly refusing SNF. -Palliative Care giving Atropine drops for excessive secretions -Patient is open to the idea of residential hospice however it wants the healthcare power of attorney to visit [facility] and have further conversations with the patient   Left lower extremity MRSA cellulitis/wound infection -Improving -Wound cultures grew MRSA -Discharged home on Bactrim, completed  course on 11/03/17 -Wound care consulted -Monitor renal function closely, avoid nephrotoxic -Repeat CMP  ??Pulmonary nodule/mass -CT chest showed lobulated mass within the major fissure on the RIGHT measuring 8.3 x 2.1 x 4.7cm. Likely pseudotumor related to previous pleural effusion. Less likely, pleural lipoma or loculated hematoma. Recommended continued radiographic or CT follow-up to document decreased size -Same mass was noted on CXR in 2015 (1.7cm) -PCP to follow up discharge  AKI, Improved -Creatinine 1.28 on admission, baseline WNL -Continue gentle IV fluids with normal saline at 75 mL's per hour -BUN/creatinine is now 20/0.64 -Held home lisinopril, avoid nephrotoxics; patient's blood pressure has been on the lower side this afternoon likely secondary to pain medication -Strict I and O's, Daily Weights; patient is +6.9 L since admission  -Nursing and said that she only made around 300 mL's this entire shift yesterday however UOP has improved  Diabetes mellitus type 2 diabetes -A1c in January 2019, 7.4 -SSI, Accu-Cheks -Hold home Metformin -CBGs have been ranging from 138-168  Hypertension -Stable -Lasix and Lisinopril on hold as patient was NPO -Many not restart given Palliative Care Discussions   Microcytic Anemia -Baseline around 11, dropped to 8.8 postoperatively and current hemoglobin hematocrit is stable at  9.6/32.0 -Likely due to iron deficiency anemia -Type and screen done, transfuse if hgb <7 -Continue iron supplements with ferrous sulfate 325 mg 3 times daily with meals when patient is able to take p.o.  Hyperbilirubinemia -Patient's T bili was 1.0 this a.m. -Continue to monitor and repeat CMP in a.m.  Non-gap Metabolic Acidosis -Improved AG has closed and CO2 is now 23  DVT prophylaxis: Was Anticoagulated with Heparin gtt but now stopped.  Code Status: DNR Family Communication: No family present at bedside  Disposition Plan: Pending clinical course. Anticipate discharge to SNF if not being made [place] patient does not aspirate immediately  Consultants:   Orthopedic Surgery Dr. Alvan Dame  Palliative Care Medicine   WOC Nurse  Procedures:  Right hip hemiarthroplasty    Antimicrobials:  Anti-infectives (From admission, onward)   Start     Dose/Rate Route Frequency Ordered Stop   11/03/17 0200  ceFAZolin (ANCEF) IVPB 2g/100 mL premix     2 g 200 mL/hr over 30 Minutes Intravenous Every 6 hours 11/02/17 2326 11/03/17 0926   11/02/17 1741  vancomycin (VANCOCIN) 1-5 GM/200ML-% IVPB    Note to Pharmacy:  Waldron Session   : cabinet override      11/02/17 1741 11/03/17 0544   11/02/17 1601  ceFAZolin (ANCEF) 2-4 GM/100ML-% IVPB    Note to Pharmacy:  Waldron Session   : cabinet override      11/02/17 1601 11/03/17 0414   11/01/17 0730  ceFAZolin (ANCEF) IVPB 2g/100 mL premix  Status:  Discontinued     2 g 200 mL/hr over 30 Minutes Intravenous On call to O.R. 11/01/17 0728 11/01/17 1113   10/31/17 2200  sulfamethoxazole-trimethoprim (BACTRIM DS,SEPTRA DS) 800-160 MG per tablet 1 tablet  Status:  Discontinued     1 tablet Oral 2 times daily 10/31/17 1233 11/03/17 1604     Subjective: Patient was seen and examined and was resting comfortably but however complained of pain in the hip and leg.  Was awake and alert and oriented and adamantly refused skilled nursing facility  placement and also has refused PEG tube placement or NGT placement for feeding.  After discussion of palliative care patient may be open to the option of residential hospice wants his healthcare power of attorney's to visit the facility first.  Objective: Vitals:   11/06/17 2123 11/06/17 2125 11/07/17 0554 11/07/17 1249  BP: (!) 141/76  134/67 130/62  Pulse: 94  (!) 44 75  Resp: (!) 22  (!) 24 12  Temp: 98.4 F (36.9 C) 98.4 F (36.9 C) 97.9 F (36.6 C)   TempSrc: Axillary Oral Oral   SpO2: 96%  (!) 89% 99%  Weight:      Height:        Intake/Output Summary (Last 24 hours) at 11/07/2017 1914 Last data filed at 11/07/2017 1800 Gross per 24 hour  Intake 1994.66 ml  Output 550 ml  Net 1444.66 ml   Filed Weights   10/31/17 0922 11/02/17 1720 11/02/17 2306  Weight: 56.7 kg (125 lb) 56.7 kg (125 lb) 66.6 kg (146 lb 13.2 oz)   Examination: Physical Exam:  Constitutional: Thin transgendered male who comes in today but is complaining of pain.  Is to be continually suctioned and does not understand why swallowing is not improving. Eyes: Sclera anicteric.  Lids and conjunctive are normal ENMT: External ears and nose appeared normal.  Patient is slightly hard. Being chronically suctioned Neck: Supple with no appreciable JVD Respiratory: Diminished to auscultation bilaterally with mild chronic; no appreciable wheezing, rales, rhonchi.  Normal respiratory effort however wearing supplemental oxygen via nasal cannula Cardiovascular: Irregularly irregular.  No appreciable murmurs, rubs, gallops. Abdomen: Soft, nontender, nondistended.  Bowel sounds present all 4 quadrants GU: Foley catheter in place Musculoskeletal: No contractures, no cyanosis.  No joint deformities noted Skin: Warm and dry with lower extremity skin changes and mild erythema.  Right hip incision appears clean dry and intact Neurologic: Cranial nerves II through XII grossly intact with no appreciable focal  deficits Psychiatric: Awake and alert.  Intact judgment and insight.  Appears frustrated  Data Reviewed: I have personally reviewed following labs and imaging studies  CBC: Recent Labs  Lab 11/03/17 0339 11/04/17 0535 11/05/17 0454 11/06/17 0529 11/07/17 0449  WBC 10.3 9.0 9.4 8.2 10.4  NEUTROABS 8.7* 7.1 7.3 6.3 8.4*  HGB 9.9* 9.4* 10.0* 9.3* 9.6*  HCT 31.1* 29.9* 33.3* 30.5* 32.0*  MCV 76.4* 78.5 79.7 79.0 80.0  PLT 268 286 325 307 675   Basic Metabolic Panel: Recent Labs  Lab 11/03/17 0751 11/04/17 0535 11/05/17 0454 11/06/17 0529 11/07/17 0449  NA 137 137 135 140 138  K 5.2* 4.6 4.6 4.0 4.2  CL 110 110 109 110 105  CO2 18* 19* 14* 21* 23  GLUCOSE 197* 131* 135* 153* 156*  BUN 31* 30* 31* 26* 20  CREATININE 1.02 0.90 0.78 0.73 0.64  CALCIUM 8.5* 8.4* 8.5* 8.4* 8.3*  MG  --   --  1.9 1.8 1.6*  PHOS  --   --  2.5 2.1* 2.4*   GFR: Estimated Creatinine Clearance (by C-G formula based on SCr of 0.64 mg/dL) Male: 38.6 mL/min Male: 44.5 mL/min Liver Function Tests: Recent Labs  Lab 11/05/17 0454 11/06/17 0529 11/07/17 0449  AST 19 16 14*  ALT 16* 15* 14*  ALKPHOS 71 65 67  BILITOT 1.3* 1.5* 1.0  PROT 5.5* 4.7* 4.9*  ALBUMIN 2.5* 2.2* 2.2*   No results for input(s): LIPASE, AMYLASE in the last 168 hours. No results for input(s): AMMONIA in the last 168 hours. Coagulation Profile: No results for input(s): INR, PROTIME in the last 168 hours. Cardiac Enzymes: No results for input(s): CKTOTAL, CKMB, CKMBINDEX, TROPONINI in the last 168 hours. BNP (last 3 results) No results for input(s): PROBNP in the last 8760 hours. HbA1C: No results for input(s): HGBA1C in the last 72 hours. CBG: Recent Labs  Lab 11/06/17 1704 11/06/17 2310 11/07/17 0847 11/07/17 1253 11/07/17 1744  GLUCAP 124* 138* 171* 168* 168*   Lipid Profile: No results for input(s): CHOL, HDL, LDLCALC, TRIG, CHOLHDL, LDLDIRECT in the last 72 hours. Thyroid Function Tests: No results for  input(s): TSH, T4TOTAL, FREET4, T3FREE, THYROIDAB in the last 72 hours. Anemia Panel: No results for input(s): VITAMINB12, FOLATE, FERRITIN, TIBC, IRON, RETICCTPCT in the last 72 hours. Sepsis Labs: No results for input(s): PROCALCITON, LATICACIDVEN in the last 168 hours.  Recent Results (from the past 240 hour(s))  MRSA PCR Screening     Status: None   Collection Time: 11/01/17 12:21 AM  Result Value Ref Range Status   MRSA by PCR NEGATIVE NEGATIVE Final    Comment:        The GeneXpert MRSA Assay (FDA approved for NASAL specimens only), is one component of a comprehensive MRSA colonization surveillance program. It is not intended to diagnose MRSA infection nor to guide or monitor treatment for MRSA infections. Performed at [facility], [place] [address] [place], [place] [address]   Urine culture     Status: None   Collection Time: 11/03/17 12:41 AM  Result Value Ref Range Status   Specimen Description   Final    URINE, CATHETERIZED Performed at [place] [address] [place], [place] [address]    Special Requests NONE  Final   Culture   Final    NO GROWTH Performed at [facility], [place] [address], [place], [place] [address]    Report Status 11/04/2017 FINAL  Final    Radiology Studies: No results found. Scheduled Meds: . calcium-vitamin D  1 tablet Oral BID WC  . fentaNYL  25 mcg Transdermal Q72H  . insulin aspart  0-15 Units Subcutaneous TID WC  . insulin aspart  0-5 Units Subcutaneous QHS   Continuous Infusions: . dextrose 5 % and 0.45% NaCl 75 mL/hr at 11/07/17 1658  . diltiazem (CARDIZEM) infusion 5 mg/hr (11/07/17 0421)  . methocarbamol (ROBAXIN)  IV Stopped (11/05/17 0251)    LOS: 7 days    Kerney Elbe, DO [facility] Pager [phone]  If 7PM-7AM, please contact night-coverage [email or web address] Password [facility] 11/07/2017, 7:14 PM

## 2017-11-07 NOTE — Progress Notes (Signed)
1200 -- [facility]) RN Visit  Received phone call from [place] regarding questions about [employer] [facility]. This was not a formal referral but a request to meet with friends/HCPOA of patient. Met with Jeani Hawking and [place] and answered all questions. Met with patient who was unable to express his wishes completely. He does not desire a feeding tube and does not want to be placed in SNF but does not understand why his health/swallowing is not improving. He does not want to have to place decision making on Scott but is unable to make a decision regarding discharge disposition.  Conversation was left that we are not receiving this as a formal referral. Friends will visit [employer] [facility] and have further conversation with patient. Contact number for [facility] was given. Friends know they can reach out to [facility], Dr. Domingo Cocking or LCSW if further information or assistance needed.  Please call with any hospice related questions or concerns or if we can be of any assistance with a referral.  Thank you, Margaretmary Eddy, RN, [facility] Liaison [phone]  [place] are on AMION.

## 2017-11-07 NOTE — Progress Notes (Signed)
Patient with lots of congestion and phlegm requiring frequent oral suctioning over night. Very difficult for patient to get phlegm up and out. Will continue to monitor patient.

## 2017-11-07 NOTE — Progress Notes (Signed)
ANTICOAGULATION CONSULT NOTE - Follow Up Consult  Pharmacy Consult for Heparin transition to Eliquis Indication: atrial fibrillation  Allergies  Allergen Reactions  . Morphine And Related Nausea And Vomiting  . Oxycodone Nausea And Vomiting  . Plavix [Clopidogrel] Rash    Rash on upper body    Patient Measurements: Height: 5\' 3"  (160 cm) Weight: 146 lb 13.2 oz (66.6 kg) IBW/kg (Calculated) : 56.9 Heparin Dosing Weight: actual weight  Vital Signs: Temp: 97.9 F (36.6 C) (04/27 0554) Temp Source: Oral (04/27 0554) BP: 134/67 (04/27 0554) Pulse Rate: 44 (04/27 0554)  Labs: Recent Labs    11/05/17 0454  11/06/17 0529 11/06/17 1627 11/07/17 0449  HGB 10.0*  --  9.3*  --  9.6*  HCT 33.3*  --  30.5*  --  32.0*  PLT 325  --  307  --  365  HEPARINUNFRC 0.27*   < > 0.29* 0.39 0.37  CREATININE 0.78  --  0.73  --  0.64   < > = values in this interval not displayed.    Estimated Creatinine Clearance (by C-G formula based on SCr of 0.64 mg/dL) Male: 38.6 mL/min Male: 44.5 mL/min  Assessment: 84 yo male admitted s/p fall on 4/20 sustaining a right hip fracture, s/p hemiarthroplasty 4/22 PM.  Patient is on chronic Eliquis for afib, last dose taken 4/20 at 0700.  Postoperatively, ordered to resume Eliquis but patient failed swallow evaluation 4/23, therefore, changing therapy to IV heparin, Pharmacy to dose.  Today, 11/07/2017  Heparin level therapeutic at 0.37 units/mL on heparin infusion at 1050 units/hr  Hgb low but stable at 9.6 today, Pltc WNL  No bleeding or infusion issues documented  SCr stable at 0.64  Goal of Therapy:  Heparin level 0.3-0.7 units/ml Monitor platelets by anticoagulation protocol: Yes   Plan:   Stop IV heparin, then immediately resume Eliquis 2.5mg  BID  Continue to monitor renal function  Monitor closely for signs/symptoms of post-op bleeding   Peggyann Juba, PharmD, BCPS Pager: [phone] 11/06/2017 7:41 AM

## 2017-11-07 NOTE — Clinical Social Work Note (Signed)
Clinical Social Work Assessment  Patient Details  Name: Logan Bean MRN: [id number] Date of Birth: 1921/07/01  Date of referral:  11/07/17               Reason for consult:  End of Life/Hospice                Permission sought to share information with:    Permission granted to share information::     Name::        Agency::     Relationship::     Contact Information:     Housing/Transportation Living arrangements for the past 2 months:  Single Family Home Source of Information:  Friend/Neighbor(Friends/HCPOA (lynn; scott)) Patient Interpreter Needed:  None Criminal Activity/Legal Involvement Pertinent to Current Situation/Hospitalization:  No - Comment as needed Significant Relationships:  Friend Lives with:  Friends Do you feel safe going back to the place where you live?    Need for family participation in patient care:  Yes (Comment)  Care giving concerns:  Patient from home with his two friends Jeani Hawking and [place]). CSW intially consulted for SNF. CSW received consult for residential hospice, preferably [facility].    Social Worker assessment / plan:  CSW contacted patient's room and spoke with patient's friend/HCPOA Jeani Hawking). CSW introduced self and described role. CSW inquired about patient's interest in having someone from [facility] come to speak with patient to answer questions, patient's friend agreed.   CSW met with patient and patient's friends/HCPOA's (lynn and scott) at bedside to discuss discharge planning, patient asleep. Patient's friends reported that patient has been in a lot of pain and that patient's pain is finally controlled. Patient's friend reported that patient has expressed that he does not want to go to a SNF. Patient's friends reported that they have all been speaking with palliative about residential hospice and that they had questions about [facility]. Patient's friends reported that [facility] liaison came to speak with them and was able to  answer some questions. Patient's friends agreed to go visit [facility] and come back to inform patient about the facility. Patient's friends reported that patient seems to be having difficulty understanding why he is not getting better. Patient's friend reported that patient does not want to go to SNF and that residential hospice seems to be the only other option. Patient's friends agreed to contact [place] after they go see [facility] and update patient about the facility.  CSW awaiting return call from patient's friends about patient's decision to pursue residential hospice.  CSW will continue to follow and assist with discharge planning.   Employment status:  Retired Forensic scientist:  Medicare PT Recommendations:  [place] / Referral to community resources:  Other (Comment Required)(Residential Hospice)  Patient/Family's Response to care:  Patient's friends appreciative of CSW assistance with discharge planning.  Patient/Family's Understanding of and Emotional Response to Diagnosis, Current Treatment, and Prognosis:  Patient asleep, CSW did not attempt to wake due to patient's friends reporting that patient has been in a lot of pain. Patient's friends involved in patient's care and HCPOA's. Patient's friends verbalized understanding of patient's current treatment and prognosis. Patient's friends informed CSW about patient's recent experience with pain and patient now having relief. Patient's friends agreed to go see [facility] so they could explain to patient what it is like and see if they helps patient make a decision about residential hospice. CSW provided active listening and emotional support.   Emotional Assessment Appearance:  Appears stated age Attitude/Demeanor/Rapport:  Unable to Assess(Patient asleep) Affect (typically observed):  Unable to Assess(PAtient asleep) Orientation:  Oriented to Self, Oriented to Situation, Oriented to Place, Oriented to   Time(Per chart review) Alcohol / Substance use:  Not Applicable Psych involvement (Current and /or in the community):  No (Comment)  Discharge Needs  Concerns to be addressed:  Care Coordination Readmission within the last 30 days:  Yes Current discharge risk:  Terminally ill Barriers to Discharge:  Continued Medical Work up   [employer], LCSW 11/07/2017, 3:01 PM

## 2017-11-07 NOTE — Progress Notes (Signed)
Palliative Care Progress Note  This morning with patient's HC POA Jeani Hawking and [facility] [place]) and we discussed his continued aspiration, decision not to pursue artificial feeding, and overall decline.  They believe that if he were to truly understand his situation, Win would focus on comfort and dignity as he approaches end of life.  We discussed this at length and they believe he would best served by pursuing placement in residential hospice for end-of-life care.  This would align best with the goal but I have also been discussing with him which are to be as comfortable as possible, avoid going to a nursing home, and spending time with friends and family.  Lyn, Scott, and I then met with Win and reviewed continued concerns regarding p.o. intake and is stated goals to avoid going to skilled nursing facility, be able to have what he wants to drink, continue to have his pain control as well as possible.  We discussed the fact that his aspiration is not a fixable problem and is going to occur regardless of what we do interventions moving forward.  I also expressed my concern that this is going to progress quickly (based upon the fact that he appears to be grossly aspirating at the bedside.  He is required frequent suctioning with out good success in alleviating his continued choking on his own secretions.)  Win states that is sounds like he is "going to croak" and is very clear in not wanting to go to SNF.  We discussed residential hospice is different from skilled nursing facility and made plan for [place] to go and tour facility.  - Pain improved today.  Continue same. - He appears to be grossly aspirating.  He chokes now even with mouth care and his own secretions.  I believe his prognosis at this point with new hip fracture, poorly controlled pain, and gross aspiration is likely less than 2 weeks.  He continues to require significant care for pain and choking related to aspiration.  I believe he would best  be served by residential hospice if this can be arranged.   - He reports being agreeable to residential hospice if [place] are able to visit and believe it will be a good fit.  I placed a consult to SW for assistance with presenting options and making hospice referral as appropriate.  Total time: 83 minutes Micheline Rough, MD [facility] Team [phone]  Greater than 50%  of this time was spent counseling and coordinating care related to the above assessment and plan.

## 2017-11-08 DIAGNOSIS — W19XXXD Unspecified fall, subsequent encounter: Secondary | ICD-10-CM

## 2017-11-08 LAB — CBC WITH DIFFERENTIAL/PLATELET
Basophils Absolute: 0.1 10*3/uL (ref 0.0–0.1)
Basophils Relative: 1 %
EOS PCT: 1 %
Eosinophils Absolute: 0.1 10*3/uL (ref 0.0–0.7)
HCT: 31.6 % — ABNORMAL LOW (ref 39.0–52.0)
HEMOGLOBIN: 9.7 g/dL — AB (ref 13.0–17.0)
LYMPHS ABS: 0.5 10*3/uL — AB (ref 0.7–4.0)
LYMPHS PCT: 5 %
MCH: 24.1 pg — AB (ref 26.0–34.0)
MCHC: 30.7 g/dL (ref 30.0–36.0)
MCV: 78.6 fL (ref 78.0–100.0)
Monocytes Absolute: 0.8 10*3/uL (ref 0.1–1.0)
Monocytes Relative: 8 %
NEUTROS PCT: 85 %
Neutro Abs: 8.6 10*3/uL — ABNORMAL HIGH (ref 1.7–7.7)
Platelets: 390 10*3/uL (ref 150–400)
RBC: 4.02 MIL/uL — AB (ref 4.22–5.81)
RDW: 18.7 % — ABNORMAL HIGH (ref 11.5–15.5)
WBC: 10 10*3/uL (ref 4.0–10.5)

## 2017-11-08 LAB — COMPREHENSIVE METABOLIC PANEL
ALK PHOS: 67 U/L (ref 38–126)
ALT: 16 U/L — AB (ref 17–63)
ANION GAP: 10 (ref 5–15)
AST: 21 U/L (ref 15–41)
Albumin: 2.3 g/dL — ABNORMAL LOW (ref 3.5–5.0)
BILIRUBIN TOTAL: 1.6 mg/dL — AB (ref 0.3–1.2)
BUN: 17 mg/dL (ref 6–20)
CALCIUM: 8.6 mg/dL — AB (ref 8.9–10.3)
CO2: 22 mmol/L (ref 22–32)
CREATININE: 0.53 mg/dL — AB (ref 0.61–1.24)
Chloride: 105 mmol/L (ref 101–111)
Glucose, Bld: 182 mg/dL — ABNORMAL HIGH (ref 65–99)
Potassium: 4.4 mmol/L (ref 3.5–5.1)
Sodium: 137 mmol/L (ref 135–145)
TOTAL PROTEIN: 5.3 g/dL — AB (ref 6.5–8.1)

## 2017-11-08 LAB — GLUCOSE, CAPILLARY
GLUCOSE-CAPILLARY: 159 mg/dL — AB (ref 65–99)
Glucose-Capillary: 182 mg/dL — ABNORMAL HIGH (ref 65–99)

## 2017-11-08 LAB — PHOSPHORUS: PHOSPHORUS: 2.5 mg/dL (ref 2.5–4.6)

## 2017-11-08 LAB — MAGNESIUM: Magnesium: 1.6 mg/dL — ABNORMAL LOW (ref 1.7–2.4)

## 2017-11-08 MED ORDER — ATROPINE SULFATE 1 % OP SOLN: 3.0000 [drp] | OPHTHALMIC | 12 refills | Status: AC | PRN

## 2017-11-08 MED ORDER — BISACODYL 10 MG RE SUPP: 10.0000 mg | Freq: Every day | RECTAL | 0 refills | Status: AC | PRN

## 2017-11-08 MED ORDER — HYDROMORPHONE HCL 1 MG/ML IJ SOLN
0.5000 mg | INTRAMUSCULAR | Status: DC | PRN
  Administered 2017-11-08: 1 mg via INTRAVENOUS
  Filled 2017-11-08 (×2): qty 1

## 2017-11-08 MED ORDER — MAGNESIUM SULFATE 2 GM/50ML IV SOLN
2.0000 g | Freq: Once | INTRAVENOUS | Status: AC
  Administered 2017-11-08: 2 g via INTRAVENOUS
  Filled 2017-11-08: qty 50

## 2017-11-08 MED ORDER — HEPARIN SOD (PORK) LOCK FLUSH 10 UNIT/ML IV SOLN
10.0000 [IU] | INTRAVENOUS | Status: AC | PRN
  Administered 2017-11-08: 10 [IU]

## 2017-11-08 MED ORDER — ONDANSETRON HCL 4 MG/2ML IJ SOLN: 4.0000 mg | Freq: Four times a day (QID) | INTRAMUSCULAR | 0 refills | Status: AC | PRN

## 2017-11-08 MED ORDER — FUROSEMIDE 10 MG/ML IJ SOLN
40.0000 mg | Freq: Once | INTRAMUSCULAR | Status: AC
  Administered 2017-11-08: 40 mg via INTRAVENOUS
  Filled 2017-11-08: qty 4

## 2017-11-08 MED ORDER — HYDROMORPHONE HCL 1 MG/ML IJ SOLN
1.0000 mg | Freq: Once | INTRAMUSCULAR | Status: AC
  Administered 2017-11-08: 1 mg via INTRAVENOUS

## 2017-11-08 MED ORDER — HYDROMORPHONE HCL 1 MG/ML IJ SOLN
1.0000 mg | Freq: Once | INTRAMUSCULAR | Status: AC
  Administered 2017-11-08: 1 mg via INTRAVENOUS
  Filled 2017-11-08 (×2): qty 1

## 2017-11-08 NOTE — Discharge Summary (Signed)
Physician Discharge Summary  Logan Bean MHD:[id number] DOB: [birth year] DOA: 10/31/2017  PCP: Lauree Chandler, NP  Admit date: 10/31/2017 Discharge date: 11/08/2017  Admitted From: Home Disposition: Residential Hospice  Recommendations for Outpatient Follow-up:  1. Follow up care per hospice protocol 2. Continue with central venous catheter in the IJ for IV access and discharge  Home Health: No Equipment/Devices: None    Discharge Condition: Guarded CODE STATUS: DO NOT RESUSCITATE Diet recommendation: Dysphagia 1 Diet with Thin Liquids  Brief/Interim Summary: Logan Bean a 84 y.o. transgenderedmalewith medical history significant for HTN, PVD, venous stasis ulcers status post stenting on left SFA, A. Fib, CHF, and other comorbids who presented to the ED c/o right hip pain after mechanical fall PTA on 10/31/17. Denies hitting his head or LOC. In the ED, xray showed R hip fracture. Of note, pt was just discharged from the hospital on 10/28/17 after being treated for left leg cellulitis. Orthopedics consulted. Pt admitted for further management. And underwent Right Hip Arthoplasty on 11/02/17. Post-operatively patient felt as if he was aspirating.    Speech reevaluated and patient remained NPO after palliative care discussion patient wants to eat and does not want a feeding tube.  Patient was started back on a dysphagia 1 diet.  Palliative care involved for goals of care and patient does not want to go to a skilled nursing facility and does not want a feeding tube. He was agreeable to residential hospice after further goals of care to care discussions as he is not tolerating any p.o. and aspirating.  Hospitalization has been complicated by atrial fibrillation with RVR along with uncontrolled pain.  Patient understands he may not get better and is okay with transitioning to hospice care at this time.  Patient's healthcare power of attorney I have told the facility if we can place and  patient will be discharged there today as they have a bed available.  Discharge Diagnoses:  Principal Problem:   Hip fracture ([place]) Active Problems:   Dyslipidemia   GERD (gastroesophageal reflux disease)   CHF (congestive heart failure) (HCC)   Chronic atrial fibrillation (HCC)   Chronic anticoagulation-Eliquis   CKD (chronic kidney disease) stage 3, GFR 30-59 ml/min (HCC)   Fall   Type 2 diabetes mellitus with diabetic chronic kidney disease (HCC)   Iron deficiency anemia secondary to inadequate dietary iron intake   Cellulitis   Abnormal chest x-ray  Right hip fracture 2/2 mechanical fall S/P R hemiarthroplasty on 11/02/17 -Pain managementby Palliative Care;  -Dr. Domingo Cocking of palliative care started the patient on a fentanyl patch and gave him IV Dilaudid which showed the patient to be much more comfortable and pain is much more controlled. -Continue with Robaxin per orthopedic surgery -Bowel regimen with bisacodyl 10 mg rectal suppositories daily as needed, max 800 g p.o. twice daily, MiraLAX 17 g daily as needed for mild constipation, -PT/OT recommend SNF -Likely permanent SNF/NH placement but patient is adamantly refusing skilled nursing facility and healthcare power of attorney's are unable to take the patient home safely. -SW on board and is to follow-up with the patient and family after palliative meeting to discuss discharge planning.  Patient still has capacity to make decisions now adamantly refusing skilled nursing facility. -She is open to residential hospice and will be discharged to [facility] today and transition to hospice care  Chronic A. Fibwith RVR -Likely due to post op/pain -Placed on Diltiazem drip in PACU rate has been controlled we will not transition to p.o.  as patient is transitioning to hospice care -CHADS2-VASc score of 5 -Started IV heparin w/o bolus and resumed Eliquis yesterday AM will now stop both heparin drip and Eliquis as patient is being  transitioned to hospice care  Dysphagia -SLP on board: Severe aspiration risk, -SLP evaluated and patient remained n.p.o. given frank aspiration risk; They Recommended against MBS because they suspected will diagnose severe dysphagia and high aspiration/malnutrition risk   -Palliative Care discussed with the healthcare power of attorney's and patient still has capacity make decisions and refuses PEG or nasogastric tube feeding.  Restarted oral intake with Dysphagia 1 Diet with understanding that aspiration is likely going to occur and if patient aspirates immediately then plans to transition to hospice care however if patient tolerates food without immediate aspiration he will likely be transitioned to his oral medications. Patient was to be discharged to SNF today however adamantly refusing SNF. -Palliative Care giving Atropine drops for excessive secretions -After further discussion with patient will be transition to hospice care at this time  Left lower extremity MRSA cellulitis/wound infection -Improving -Wound cultures grew MRSA -Discharged home on Bactrim, completed course on 11/03/17 -Wound care consulted -We will repeat lab work as patient as patient is transitioning to hospice care  ??Pulmonary nodule/mass -CT chest showed lobulated mass within the major fissure on the RIGHT measuring 8.3 x 2.1 x 4.7cm. Likely pseudotumor related to previous pleural effusion. Less likely, pleural lipoma or loculated hematoma. Recommended continued radiographic or CT follow-up to document decreased size -Same mass was noted on CXR in 2015 (1.7cm) -Will not work-up for this patient is transitioning to hospice care  AKI, Improved -Creatinine 1.28 on admission, baseline WNL -Discontinued IV fluids with normal saline at 75 mL's per hour -BUN/creatinine is now 17/0.53 -Held home lisinopril, avoid nephrotoxics; patient's blood pressure has been on the lower side this afternoon likely secondary to pain  medication -Strict I and O's, Daily Weights; patient is +7.097 L since admission; give IV Lasix 40 mg x 1 -Repeat blood work now as patient is transitioning to hospice care  Diabetes mellitus type 2 diabetes -A1c in January 2019, 7.4 -SSI, Accu-Cheks -Hold home Metformin -CBGs have been ranging from 118-159  Hypertension -Stable -Lasix and Lisinopril on hold as patient was NPO and will not continue at discharge patient was transitioned to hospice care  Microcytic Anemia -Baseline around 11, dropped to 8.8 postoperatively and current hemoglobin hematocrit is stable at 9.7/31.6 -Likely due to iron deficiency anemia -Will not repeat as patient is transitioning to hospice care  Hyperbilirubinemia -Patient's T bili was 1.6 this a.m. -We will not repeat as patient positioning to hospice care  Non-gap Metabolic Acidosis - CO2 is now 22 -Repeat blood work if patient is transitioning to hospice care  Discharge Instructions  Discharge Instructions    Call MD for:  difficulty breathing, headache or visual disturbances   Complete by:  As directed    Call MD for:  extreme fatigue   Complete by:  As directed    Call MD for:  hives   Complete by:  As directed    Call MD for:  persistant dizziness or light-headedness   Complete by:  As directed    Call MD for:  persistant nausea and vomiting   Complete by:  As directed    Call MD for:  redness, tenderness, or signs of infection (pain, swelling, redness, odor or green/yellow discharge around incision site)   Complete by:  As directed    Call MD  for:  severe uncontrolled pain   Complete by:  As directed    Call MD for:  temperature >100.4   Complete by:  As directed    Diet - low sodium heart healthy   Complete by:  As directed    Dysphagia 1 diet with thin liquids   Discharge instructions   Complete by:  As directed    Follow-up care per hospice protocol   Increase activity slowly   Complete by:  As directed    Weight  bearing as tolerated   Complete by:  As directed    Laterality:  right   Extremity:  Lower     Allergies as of 11/08/2017      Reactions   Morphine And Related Nausea And Vomiting   Oxycodone Nausea And Vomiting   Plavix [clopidogrel] Rash   Rash on upper body      Medication List    STOP taking these medications   acetaminophen 325 MG tablet Commonly known as:  TYLENOL   acetaminophen 500 MG tablet Commonly known as:  TYLENOL   aspirin EC 81 MG tablet   BENADRYL ALLERGY 25 MG tablet Generic drug:  diphenhydrAMINE   Calcium Carbonate-Vitamin D 600-200 MG-UNIT Tabs   collagenase ointment Commonly known as:  SANTYL   ELIQUIS 2.5 MG Tabs tablet Generic drug:  apixaban   ferrous sulfate 325 (65 FE) MG tablet   fexofenadine 180 MG tablet Commonly known as:  ALLEGRA ALLERGY   Fish Oil 1000 MG Caps   GLUCOSAMINE 1500 COMPLEX Caps   glucose blood test strip Commonly known as:  TRUETEST TEST   lactobacillus Pack   metFORMIN 500 MG tablet Commonly known as:  GLUCOPHAGE   metoprolol tartrate 25 MG tablet Commonly known as:  LOPRESSOR   multivitamin with minerals Tabs tablet   ondansetron 4 MG tablet Commonly known as:  ZOFRAN Replaced by:  ondansetron 4 MG/2ML Soln injection   simvastatin 10 MG tablet Commonly known as:  ZOCOR   sulfamethoxazole-trimethoprim 800-160 MG tablet Commonly known as:  BACTRIM DS,SEPTRA DS   TRUEPLUS LANCETS 30G Misc     TAKE these medications   atropine 1 % ophthalmic solution Place 3 drops under the tongue every 4 (four) hours as needed (secretions).   bisacodyl 10 MG suppository Commonly known as:  DULCOLAX Place 1 suppository (10 mg total) rectally daily as needed for moderate constipation.   furosemide 20 MG tablet Commonly known as:  LASIX take 10-20 mg by mouth daily as needed for fluid--do not take while you are taking Bactrim antibiotic   methocarbamol 500 MG tablet Commonly known as:  ROBAXIN Take 1 tablet  (500 mg total) by mouth every 6 (six) hours as needed for muscle spasms.   ondansetron 4 MG/2ML Soln injection Commonly known as:  ZOFRAN Inject 2 mLs (4 mg total) into the vein every 6 (six) hours as needed for nausea. Replaces:  ondansetron 4 MG tablet   polyethylene glycol packet Commonly known as:  MIRALAX / GLYCOLAX Take 17 g by mouth 2 (two) times daily.     ASK your doctor about these medications   HYDROcodone-acetaminophen 5-325 MG tablet Commonly known as:  NORCO/VICODIN Take 1-2 tablets by mouth every 4 (four) hours as needed for up to 5 days for moderate pain. Ask about: Should I take this medication?            Discharge Care Instructions  (From admission, onward)        Start  Ordered   11/04/17 0000  Weight bearing as tolerated    Question Answer Comment  Laterality right   Extremity Lower      11/04/17 0849     Follow-up Information    Paralee Cancel, MD. Schedule an appointment as soon as possible for a visit in 2 weeks.   Specialty:  Orthopedic Surgery Contact information: [address] [address] [place] [address] [phone]          Allergies  Allergen Reactions  . Morphine And Related Nausea And Vomiting  . Oxycodone Nausea And Vomiting  . Plavix [Clopidogrel] Rash    Rash on upper body   Consultations:  Orthopedic Surgery  Palliative Care Medicine  Procedures/Studies: Dg Chest 1 View  Result Date: 10/31/2017 CLINICAL DATA:  84 year old male with right leg pain after falling at home EXAM: CHEST  1 VIEW COMPARISON:  Prior chest x-ray 04/26/2015 FINDINGS: Stable borderline cardiomegaly. Atherosclerotic calcifications again noted in the transverse aorta. Mediastinal contours remain unchanged. Small volume of free fluid now present along the minor fissure. Developing nodular airspace opacity in the right suprahilar region. Otherwise, unchanged appearance of the lungs with chronic bronchitic changes and mild interstitial  prominence. No pneumothorax or pleural effusion. No acute osseous abnormality. Surgical changes of prior right reverse shoulder arthroplasty. IMPRESSION: 1. Developing nodular opacity in the right suprahilar region may represent infiltrate, pulmonary nodule, or super hilar adenopathy. If clinically warranted, CT scan of the chest with intravenous contrast could further evaluate. 2. Small fluid tracking along the minor fissure. 3. Otherwise, no acute cardiopulmonary process. 4.  Aortic Atherosclerosis (ICD10-170.0) Electronically Signed   By: Jacqulynn Cadet M.D.   On: 10/31/2017 11:03   Ct Head Wo Contrast  Result Date: 10/31/2017 CLINICAL DATA:  Fall today.  Initial encounter. EXAM: CT HEAD WITHOUT CONTRAST TECHNIQUE: Contiguous axial images were obtained from the base of the skull through the vertex without intravenous contrast. COMPARISON:  04/26/2015 FINDINGS: Brain: There is no evidence of acute infarct, intracranial hemorrhage, mass, or midline shift. Cerebral atrophy is unchanged and not greater than expected for age. Asymmetric extra-axial CSF overlying the left frontal lobe measures 8 mm in thickness, unchanged and compatible with a small subdural hygroma. Mildly prominent CSF along both sides of the falx near the vertex is also unchanged and may also represent small subdural hygromas or be related to atrophy. Cerebral white matter hypodensities are similar to the prior study and nonspecific but compatible with chronic small vessel ischemic disease, mild for age. Heterogeneous hypoattenuation in the deep gray nuclei also likely reflects chronic small vessel ischemia/chronic lacunar infarcts. Vascular: Calcified atherosclerosis at the skull base. No hyperdense vessel. Skull: No fracture or suspicious osseous lesion. Sinuses/Orbits: Minimal scattered mucosal thickening in the paranasal sinuses. Clear mastoid air cells. Bilateral cataract extraction. Other: None. IMPRESSION: No evidence of acute  intracranial abnormality. Electronically Signed   By: Logan Bores M.D.   On: 10/31/2017 13:39   Ct Chest Wo Contrast  Result Date: 10/31/2017 CLINICAL DATA:  Pt got up to go to bathroom at home this morning and just got to weak and fell, denies hitting his head, has rt leg pain EXAM: CT CHEST WITHOUT CONTRAST TECHNIQUE: Multidetector CT imaging of the chest was performed following the standard protocol without IV contrast. COMPARISON:  Chest x-ray 10/31/2017 FINDINGS: Cardiovascular: The heart is enlarged. There is dense calcification of the mitral annulus and coronary arteries. There is atherosclerotic calcification of the thoracic aorta not associated with aneurysm. Mediastinum/Nodes: The RIGHT  lobe of the thyroid is enlarged and slightly heterogeneous without discrete mass. There numerous enlarged mediastinal lymph nodes. Precarinal lymph node is 12 millimeters in short axis. Enlarged prevascular lymph nodes are present, largest measuring 11 millimeters in short axis. Enlarged subcarinal lymph node is 2.2 centimeters. Assessment of the hilar lymph nodes is more limited due to lack of intravenous contrast. The esophagus is normal in appearance. Lungs/Pleura: There is pan lobular emphysema, most marked at the apices. A low-attenuation lobulated mass is identified within the RIGHT major fissure measuring 2.1 x 8.3 x 4.7 centimeters. By density measurements of -8 Hounsfield units, mass likely contains water and/or fat. There is mild septal wall thickening, consistent with edema. No suspicious pulmonary nodules. No consolidations. Trace bilateral pleural effusions. No pneumothorax. There are bilateral subpleural reticular changes. Upper Abdomen: Contour of the liver appears nodular raising the question of cirrhosis. LEFT adrenal nodule is 3.3 x 3.8 centimeters, and measures low density, consistent with benign adenoma. RIGHT adrenal gland is normal in appearance. Musculoskeletal: Remote rib fractures. No evidence  for acute RIGHT shoulder arthroplasty. Abnormality. IMPRESSION: 1. Lobulated mass within the major fissure on the RIGHT measuring 8.3 x 2.1 x 4.7 centimeters. Findings are favored to represent a pseudotumor related to previous pleural effusion. Less likely, this could represent a pleural lipoma or loculated hematoma. Recommend continued radiographic or CT follow-up to document decreased size. 2. Enlarged mediastinal lymph nodes warrant follow-up. Follow-up chest CT is recommended in 6 months. Intravenous contrast is recommended unless contraindicated for optimal evaluation of the hilar regions. 3. Cardiomegaly.  Coronary artery calcification. 4. Mild septal thickening consistent with mild edema. 5.  Aortic atherosclerosis.  (ICD10-I70.0) 6.  Emphysema (ICD10-J43.9) 7. Question of hepatic cirrhosis. 8. LEFT adrenal adenoma. 9. Remote rib fractures.  No acute injury.  No pneumothorax. Electronically Signed   By: Nolon Nations M.D.   On: 10/31/2017 14:29   Pelvis Portable  Result Date: 11/02/2017 CLINICAL DATA:  Right hemiarthroplasty EXAM: PORTABLE PELVIS 1-2 VIEWS COMPARISON:  None. FINDINGS: Uncemented right bipolar hip arthroplasty without immediate postoperative hardware failure nor periprosthetic fracture. No joint dislocation. The native left hip demonstrates degenerative subchondral cystic change of the femoral head with loss of sphericity and mild osteoarthritic joint space narrowing. Lower lumbar degenerative disc and facet arthropathy seen from L4 through S1. Pelvis appears slightly demineralized without for acute fracture. Common femoral arteriosclerosis is noted. IMPRESSION: 1. No immediate complications status post right uncemented bipolar hip arthroplasty. 2. Osteoarthritis of the native left hip. 3. Lower lumbar degenerative disc and facet arthropathy. Electronically Signed   By: Ashley Royalty M.D.   On: 11/02/2017 22:36   Ct Hip Right Wo Contrast  Result Date: 10/31/2017 CLINICAL DATA:   Patient status post fall with a right hip fracture today. Preoperative imaging. Initial encounter. EXAM: CT OF THE RIGHT HIP WITHOUT CONTRAST TECHNIQUE: Multidetector CT imaging of the right hip was performed according to the standard protocol. Multiplanar CT image reconstructions were also generated. COMPARISON:  Plain films right hip this same day. FINDINGS: Bones/Joint/Cartilage There is a subcapital fracture of the right hip. The femoral neck directed anteriorly due to posterior rotation of the greater trochanter. There is approximately 1/2 shaft width lateral/anterior displacement of the femoral neck. No intertrochanteric involvement is identified. No other fracture is seen. Degenerative disc disease lower lumbar spine noted. No lytic or sclerotic lesion is identified. Ligaments Suboptimally assessed by CT. Muscles and Tendons There is fatty atrophy of the gluteus minimus. Soft tissues Subcutaneous soft tissue contusion  over the hip noted. Atherosclerotic vascular disease identified. IMPRESSION: Subcapital fracture right hip. The femoral neck fracture is directed anteriorly due to posterior rotation of the greater trochanter. Electronically Signed   By: Inge Rise M.D.   On: 10/31/2017 15:02   Dg Foot Complete Left  Result Date: 10/25/2017 CLINICAL DATA:  Cellulitis wound above the medial malleolus EXAM: LEFT FOOT - COMPLETE 3+ VIEW COMPARISON:  07/30/2011 FINDINGS: Diffuse osteopenia. Mild sclerosis and irregularity at the head of the fifth metatarsal. No definite periostitis or bony destructive changes. Suggested osseous bridging between the navicular and cuboid with sclerosis and probable partial fusion at the cuneiform cuboidal articulation. These findings do not appear significantly changed. No soft tissue gas. IMPRESSION: 1. Diffuse osteopenia. Possible subacute fracture at the head of the fifth metatarsal. Electronically Signed   By: Donavan Foil M.D.   On: 10/25/2017 23:49   Dg Hip Unilat   With Pelvis 2-3 Views Right  Result Date: 10/31/2017 CLINICAL DATA:  Right hip pain due to a fall today. Initial encounter. EXAM: DG HIP (WITH OR WITHOUT PELVIS) 2-3V RIGHT COMPARISON:  Plain films of the left hip and pelvis 04/26/2015. FINDINGS: The patient has an acute subcapital fracture of the right hip. No other acute abnormality is identified. Bones are osteopenic. Prominent stool ball in the rectum noted. IMPRESSION: Acute subcapital fracture right hip. Osteopenia. Electronically Signed   By: Inge Rise M.D.   On: 10/31/2017 11:01    Subjective: Seen and examined at bedside and was still complaining of significant hip pain.  Was okay with going to residential hospice given that his healthcare power of attorney visited the facility.  Will be transition to hospice care and discharge to [facility].  Discharge Exam: Vitals:   11/08/17 0330 11/08/17 0411  BP: (!) 142/68 124/60  Pulse:    Resp: (!) 22 18  Temp:  (!) 96.8 F (36 C)  SpO2: 98% 99%   Vitals:   11/07/17 2224 11/08/17 0204 11/08/17 0330 11/08/17 0411  BP: 130/68 128/64 (!) 142/68 124/60  Pulse: 89 75    Resp: 20 18 (!) 22 18  Temp: 97.7 F (36.5 C) 97.9 F (36.6 C)  (!) 96.8 F (36 C)  TempSrc: Oral Oral  Oral  SpO2: 98% 97% 98% 99%  Weight:      Height:       General: Pt is alert, awake, and in Pain Cardiovascular: Irregularly Irregular, S1/S2 +, no rubs, no gallops Respiratory: Diminished bilaterally with some wheezing. Abdominal: Soft, NT, ND, bowel sounds + Extremities: 1+ LE edema no cyanosis  The results of significant diagnostics from this hospitalization (including imaging, microbiology, ancillary and laboratory) are listed below for reference.    Microbiology: Recent Results (from the past 240 hour(s))  MRSA PCR Screening     Status: None   Collection Time: 11/01/17 12:21 AM  Result Value Ref Range Status   MRSA by PCR NEGATIVE NEGATIVE Final    Comment:        The GeneXpert MRSA Assay  (FDA approved for NASAL specimens only), is one component of a comprehensive MRSA colonization surveillance program. It is not intended to diagnose MRSA infection nor to guide or monitor treatment for MRSA infections. Performed at [facility], [place] [address] [place], [place] [address]   Urine culture     Status: None   Collection Time: 11/03/17 12:41 AM  Result Value Ref Range Status   Specimen Description   Final    URINE,  CATHETERIZED Performed at [place] [address], [place], [place] [address]    Special Requests NONE  Final   Culture   Final    NO GROWTH Performed at [facility], [place] [address], [place], [place] [address]    Report Status 11/04/2017 FINAL  Final    Labs: BNP (last 3 results) No results for input(s): BNP in the last 8760 hours. Basic Metabolic Panel: Recent Labs  Lab 11/04/17 0535 11/05/17 0454 11/06/17 0529 11/07/17 0449 11/08/17 0314  NA 137 135 140 138 137  K 4.6 4.6 4.0 4.2 4.4  CL 110 109 110 105 105  CO2 19* 14* 21* 23 22  GLUCOSE 131* 135* 153* 156* 182*  BUN 30* 31* 26* 20 17  CREATININE 0.90 0.78 0.73 0.64 0.53*  CALCIUM 8.4* 8.5* 8.4* 8.3* 8.6*  MG  --  1.9 1.8 1.6* 1.6*  PHOS  --  2.5 2.1* 2.4* 2.5   Liver Function Tests: Recent Labs  Lab 11/05/17 0454 11/06/17 0529 11/07/17 0449 11/08/17 0314  AST 19 16 14* 21  ALT 16* 15* 14* 16*  ALKPHOS 71 65 67 67  BILITOT 1.3* 1.5* 1.0 1.6*  PROT 5.5* 4.7* 4.9* 5.3*  ALBUMIN 2.5* 2.2* 2.2* 2.3*   No results for input(s): LIPASE, AMYLASE in the last 168 hours. No results for input(s): AMMONIA in the last 168 hours. CBC: Recent Labs  Lab 11/04/17 0535 11/05/17 0454 11/06/17 0529 11/07/17 0449 11/08/17 0314  WBC 9.0 9.4 8.2 10.4 10.0  NEUTROABS 7.1 7.3 6.3 8.4* 8.6*  HGB 9.4* 10.0* 9.3* 9.6* 9.7*  HCT 29.9* 33.3* 30.5* 32.0* 31.6*  MCV 78.5 79.7 79.0 80.0 78.6  PLT 286 325 307 365 390   Cardiac Enzymes: No results  for input(s): CKTOTAL, CKMB, CKMBINDEX, TROPONINI in the last 168 hours. BNP: Invalid input(s): POCBNP CBG: Recent Labs  Lab 11/07/17 1253 11/07/17 1744 11/07/17 2229 11/08/17 0803 11/08/17 1149  GLUCAP 168* 168* 118* 182* 159*   D-Dimer No results for input(s): DDIMER in the last 72 hours. Hgb A1c No results for input(s): HGBA1C in the last 72 hours. Lipid Profile No results for input(s): CHOL, HDL, LDLCALC, TRIG, CHOLHDL, LDLDIRECT in the last 72 hours. Thyroid function studies No results for input(s): TSH, T4TOTAL, T3FREE, THYROIDAB in the last 72 hours.  Invalid input(s): FREET3 Anemia work up No results for input(s): VITAMINB12, FOLATE, FERRITIN, TIBC, IRON, RETICCTPCT in the last 72 hours. Urinalysis    Component Value Date/Time   COLORURINE AMBER (A) 11/03/2017 0041   APPEARANCEUR HAZY (A) 11/03/2017 0041   LABSPEC 1.026 11/03/2017 0041   PHURINE 5.0 11/03/2017 0041   GLUCOSEU NEGATIVE 11/03/2017 0041   HGBUR SMALL (A) 11/03/2017 0041   BILIRUBINUR NEGATIVE 11/03/2017 0041   KETONESUR NEGATIVE 11/03/2017 0041   PROTEINUR 30 (A) 11/03/2017 0041   UROBILINOGEN 0.2 04/26/2015 2110   NITRITE NEGATIVE 11/03/2017 0041   LEUKOCYTESUR TRACE (A) 11/03/2017 0041   Sepsis Labs Invalid input(s): PROCALCITONIN,  WBC,  LACTICIDVEN Microbiology Recent Results (from the past 240 hour(s))  MRSA PCR Screening     Status: None   Collection Time: 11/01/17 12:21 AM  Result Value Ref Range Status   MRSA by PCR NEGATIVE NEGATIVE Final    Comment:        The GeneXpert MRSA Assay (FDA approved for NASAL specimens only), is one component of a comprehensive MRSA colonization surveillance program. It is not intended to diagnose MRSA infection nor to guide or monitor treatment  for MRSA infections. Performed at [facility], [place] [address] [place], [place] [address]   Urine culture     Status: None   Collection Time: 11/03/17 12:41 AM  Result Value Ref  Range Status   Specimen Description   Final    URINE, CATHETERIZED Performed at [facility] [address], [place], [place] [address]    Special Requests NONE  Final   Culture   Final    NO GROWTH Performed at [facility], [place] [address] [place], [place] [address]    Report Status 11/04/2017 FINAL  Final   Time coordinating discharge: 25 minutes  SIGNED:  Kerney Elbe, DO [facility] 11/08/2017, 12:06 PM Pager [phone]  If 7PM-7AM, please contact night-coverage [email or web address] Password [email or web address]

## 2017-11-08 NOTE — Progress Notes (Signed)
After rescue dose of Dialudid and increase of Cardizem drip to 8cc/hr Pt is asleep.  HR is 96.  BP 124/60.

## 2017-11-08 NOTE — Progress Notes (Signed)
Report called to Helene Kelp at [facility]. All questions answered and contact number provided. Pt HCPOA, [employer], present at time of transfer by [employer]. Pt pre med w/ Diluadid IV per order prior to transport and left IJ flushed per protocol by IV team.

## 2017-11-08 NOTE — Progress Notes (Signed)
Discharge to: [facility] Anticipated discharge date: 11/08/17 Transportation by: [facility]  CSW signing off.  Laveda Abbe LCSW [phone]

## 2017-11-08 NOTE — Plan of Care (Signed)
  Problem: Health Behavior/Discharge Planning: Goal: Ability to manage health-related needs will improve Outcome: Adequate for Discharge   Problem: Clinical Measurements: Goal: Ability to maintain clinical measurements within normal limits will improve Outcome: Adequate for Discharge   Problem: Activity: Goal: Risk for activity intolerance will decrease Outcome: Adequate for Discharge   Problem: Nutrition: Goal: Adequate nutrition will be maintained Outcome: Adequate for Discharge   Problem: Elimination: Goal: Will not experience complications related to bowel motility Outcome: Adequate for Discharge   Problem: Skin Integrity: Goal: Risk for impaired skin integrity will decrease Outcome: Adequate for Discharge

## 2017-11-08 NOTE — Progress Notes (Signed)
CSW following for discharge plan. CSW received voicemail from patient's friend Nicki Reaper last night that they had toured [facility] and were satisfied with the facility; would prefer transition to residential hospice at [facility]. CSW spoke with [facility] admissions RN this morning to confirm bed availability, and facility is ready for patient to admit today. CSW alerted MD.  CSW spoke with patient's friend, Nicki Reaper, to confirm plan to transition to [facility] for residential hospice. Scott discussed concern about patient's pain not being managed, and how it will be very difficult for him to ride in an ambulance if the pain is not controlled prior. CSW discussed per chart review that it appears that patient's pain medication has been adjusted and RN reported overnight that he was resting comfortably. CSW encouraged Nicki Reaper to evaluate patient upon arrival and advocate for the patient with the RN, as well as to advocate for patient to receive increased medication prior to ambulance pick up to ensure his comfort during transport. CSW to follow up with MD if needed.  CSW alerted [facility] admissions that patient's friends will be at the hospital shortly this morning and will be available for admissions paperwork. CSW will continue to follow.  Laveda Abbe, LCSW Clinical Social Worker [phone] (Weekend coverage)

## 2017-11-08 NOTE — Progress Notes (Signed)
Palliative Care Progress Note  This morning with patient's HC POA Jeani Hawking and [place]) and we discussed his continued aspiration, decision not to pursue artificial feeding, and overall decline.  Jeani Hawking and Kila were able to visit [facility] yesterday and feel that this would be a good fit for [place] Bean.  On seeing him today, [place] Bean is even weaker and cannot really participate in conversation at this time.  It is unclear to me if some lethargy is related to pain medications as pain and medication needs have been increasing.  [place] had stated yesterday that he is agreeable to [facility] if this is where Event organiser and Jeani Hawking feel he would best be served after visiting.  As this is the case, will pursue transition to [facility] today.  HCPOAs main concern is pain during transport.  We discussed plan to premedicate just prior to discharge.  Per their request, we also reviewed plan of care that was presented to them by hospice yesterday.  I let them know that I am in agreement with proposed care plan.  - Pain continues to be an intermittent issue.  Increase frequency to Q1 hour for rescue medication.  Plan for additional dose just prior to transport.  IJ line to stay in place on discharge. - He appears to be grossly aspirating.  He chokes now even with mouth care and his own secretions.  I believe his prognosis at this point with new hip fracture, poorly controlled pain, and gross aspiration is likely less than 2 weeks.  He continues to require significant care for pain and choking related to aspiration.  I believe he would best be served by residential hospice.  To [facility] today.  Total time: 40 minutes Micheline Rough, MD [place] Team [phone]  Greater than 50%  of this time was spent counseling and coordinating care related to the above assessment and plan.

## 2017-11-08 NOTE — Progress Notes (Signed)
Pt has been receiving 1 mg of Dilaudid q2 hours and Robaxin q6 hours.  All comfort measures provided.  Has had in increase of pain in past hour, pt hollaring out frequently despite medication.  HR is sustaining 130's and 140s continues A fib.   [facility] on call notified and orders a rescue dose of Dilaudid and increase of Cardizem to 8cc/hr. Will continue to monitor closely.

## 2017-11-08 NOTE — Progress Notes (Signed)
[facility] -- [facility]) RN Visit for [place] request from Lissa Morales for family interest in [facility] with request for transfer today. Chart reviewed and received report from Dr. Micheline Rough and bedside RN Elzie Rings. Met with family to confirm interest and explain services. Family agreeable to transfer today. CSW aware. Registration paper work completed. Dr. Orpah Melter to assume care per family request.  Discharge summary faxed with consents.  RN please call report to [phone].  [facility] called by Kathlee Nations at [phone].  Thank you, Margaretmary Eddy, RN, [facility] Liaison [phone]  [place] are on AMION.

## 2017-11-10 ENCOUNTER — Encounter: Payer: Self-pay | Admitting: Nurse Practitioner

## 2017-11-11 DEATH — deceased

## 2017-12-08 ENCOUNTER — Ambulatory Visit: Payer: Medicare Other | Admitting: Nurse Practitioner

## 2018-08-06 ENCOUNTER — Ambulatory Visit: Payer: Self-pay
